# Patient Record
Sex: Male | Born: 1945
Health system: Southern US, Community
[De-identification: ages and names within clinical notes are randomized; demographics above are authoritative.]

## PROBLEM LIST (undated history)

## (undated) ENCOUNTER — Ambulatory Visit: Admission: EM | Payer: 59 | Source: Home / Self Care

## (undated) DIAGNOSIS — C61 Malignant neoplasm of prostate: Secondary | ICD-10-CM

## (undated) DIAGNOSIS — Z87442 Personal history of urinary calculi: Secondary | ICD-10-CM

## (undated) DIAGNOSIS — K219 Gastro-esophageal reflux disease without esophagitis: Secondary | ICD-10-CM

## (undated) DIAGNOSIS — G629 Polyneuropathy, unspecified: Secondary | ICD-10-CM

## (undated) DIAGNOSIS — C44300 Unspecified malignant neoplasm of skin of unspecified part of face: Secondary | ICD-10-CM

## (undated) DIAGNOSIS — M1991 Primary osteoarthritis, unspecified site: Secondary | ICD-10-CM

## (undated) DIAGNOSIS — N200 Calculus of kidney: Secondary | ICD-10-CM

## (undated) DIAGNOSIS — C189 Malignant neoplasm of colon, unspecified: Secondary | ICD-10-CM

## (undated) DIAGNOSIS — I639 Cerebral infarction, unspecified: Secondary | ICD-10-CM

## (undated) DIAGNOSIS — G43909 Migraine, unspecified, not intractable, without status migrainosus: Secondary | ICD-10-CM

## (undated) DIAGNOSIS — Z973 Presence of spectacles and contact lenses: Secondary | ICD-10-CM

## (undated) DIAGNOSIS — Z889 Allergy status to unspecified drugs, medicaments and biological substances status: Secondary | ICD-10-CM

## (undated) DIAGNOSIS — M5030 Other cervical disc degeneration, unspecified cervical region: Secondary | ICD-10-CM

## (undated) DIAGNOSIS — G894 Chronic pain syndrome: Secondary | ICD-10-CM

## (undated) HISTORY — PX: INGUINAL HERNIA REPAIR: SHX194

## (undated) HISTORY — PX: UPPER GI ENDOSCOPY: SHX6162

## (undated) HISTORY — PX: TONSILLECTOMY: SUR1361

## (undated) HISTORY — DX: Cerebral infarction, unspecified: I63.9

## (undated) HISTORY — PX: EXCISIONAL HEMORRHOIDECTOMY: SHX1541

## (undated) HISTORY — DX: Polyneuropathy, unspecified: G62.9

## (undated) HISTORY — PX: NASAL SEPTUM SURGERY: SHX37

## (undated) HISTORY — DX: Chronic pain syndrome: G89.4

## (undated) HISTORY — DX: Primary osteoarthritis, unspecified site: M19.91

## (undated) HISTORY — PX: COLONOSCOPY: SHX174

## (undated) HISTORY — PX: INGUINAL HERNIA REPAIR: SUR1180

---

## 1998-10-21 HISTORY — PX: INGUINAL HERNIA REPAIR: SUR1180

## 1999-10-22 HISTORY — PX: KNEE ARTHROSCOPY: SUR90

## 2001-08-14 ENCOUNTER — Encounter: Payer: Self-pay | Admitting: Internal Medicine

## 2001-08-14 ENCOUNTER — Ambulatory Visit (HOSPITAL_COMMUNITY): Admission: RE | Admit: 2001-08-14 | Discharge: 2001-08-14 | Payer: Self-pay | Admitting: Internal Medicine

## 2005-11-01 ENCOUNTER — Ambulatory Visit (HOSPITAL_COMMUNITY): Admission: RE | Admit: 2005-11-01 | Discharge: 2005-11-01 | Payer: Self-pay | Admitting: General Surgery

## 2006-12-24 ENCOUNTER — Ambulatory Visit (HOSPITAL_COMMUNITY): Admission: RE | Admit: 2006-12-24 | Discharge: 2006-12-24 | Payer: Self-pay | Admitting: *Deleted

## 2008-10-21 DIAGNOSIS — G629 Polyneuropathy, unspecified: Secondary | ICD-10-CM

## 2008-10-21 HISTORY — DX: Polyneuropathy, unspecified: G62.9

## 2009-10-10 ENCOUNTER — Ambulatory Visit (HOSPITAL_COMMUNITY): Admission: RE | Admit: 2009-10-10 | Discharge: 2009-10-10 | Payer: Self-pay | Admitting: Internal Medicine

## 2009-10-21 HISTORY — PX: CARPAL TUNNEL RELEASE: SHX101

## 2011-03-08 NOTE — H&P (Signed)
[name], Eduardo [name]                 ACCOUNT NO.:  [id number]   MEDICAL RECORD NO.:  [id number]           PATIENT TYPE:  AMB   LOCATION:                                FACILITY:  [facility]   PHYSICIAN:  Dalia Heading, M.D.  DATE OF BIRTH:  11-Aug-1946   DATE OF ADMISSION:  DATE OF DISCHARGE:  LH                                HISTORY & PHYSICAL   CHIEF COMPLAINT:  Hematochezia.   HISTORY OF PRESENT ILLNESS:  The patient is a 65 year old white male who was  referred for endoscopic evaluation. He needs colonoscopy for hematochezia.  No abdominal pain, weight loss, nausea, vomiting, diarrhea, constipation,  melena have been noted.  Some pruritis ani is noted.  He has never had a  colonoscopy. There is no family history of colon carcinoma.   PAST MEDICAL HISTORY:  Migraine headaches.   PAST SURGICAL HISTORY:  1.  Hemorrhoidectomy.  2.  Herniorrhaphy.  3.  Knee surgeries.   CURRENT MEDICATIONS:  Darvocet, Imitrex.   ALLERGIES:  No known drug allergies.   REVIEW OF SYSTEMS:  Noncontributory.   PHYSICAL EXAMINATION:  GENERAL:  The patient is a well-developed, well-  nourished white male in no acute distress.  LUNGS:  Clear to auscultation with equal breath sounds bilaterally.  HEART:  Regular rate and rhythm without S3, S4, or murmurs.  ABDOMEN:  Soft, nontender, nondistended.  No hepatosplenomegaly or masses  are noted.  RECTAL:  Deferred to the procedure.   IMPRESSION:  Hematochezia.  The patient is scheduled for colonoscopy on  November 01, 2005.  The risks and benefits of the procedure including  bleeding and perforation were fully explained to the patient who gave  informed consent.      Dalia Heading, M.D.  Electronically Signed     MAJ/MEDQ  D:  10/29/2005  T:  10/29/2005  Job:  [id number]   cc:   [facility]  Fax: [phone]   Madelin Rear. Sherwood Gambler, MD  Fax: [phone]

## 2011-07-02 ENCOUNTER — Other Ambulatory Visit (HOSPITAL_COMMUNITY): Payer: Self-pay | Admitting: Internal Medicine

## 2011-07-02 DIAGNOSIS — M1991 Primary osteoarthritis, unspecified site: Secondary | ICD-10-CM

## 2011-07-08 ENCOUNTER — Ambulatory Visit (HOSPITAL_COMMUNITY)
Admission: RE | Admit: 2011-07-08 | Discharge: 2011-07-08 | Disposition: A | Discharge status: Home / Self Care | Payer: Managed Care, Other (non HMO) | Source: Ambulatory Visit | Attending: Internal Medicine | Admitting: Internal Medicine

## 2011-07-08 DIAGNOSIS — M1991 Primary osteoarthritis, unspecified site: Secondary | ICD-10-CM

## 2011-07-08 DIAGNOSIS — M25559 Pain in unspecified hip: Secondary | ICD-10-CM | POA: Insufficient documentation

## 2011-07-08 DIAGNOSIS — M5137 Other intervertebral disc degeneration, lumbosacral region: Secondary | ICD-10-CM | POA: Insufficient documentation

## 2011-07-08 DIAGNOSIS — M51379 Other intervertebral disc degeneration, lumbosacral region without mention of lumbar back pain or lower extremity pain: Secondary | ICD-10-CM | POA: Insufficient documentation

## 2012-10-21 HISTORY — PX: COLON SURGERY: SHX602

## 2013-01-04 ENCOUNTER — Encounter (HOSPITAL_BASED_OUTPATIENT_CLINIC_OR_DEPARTMENT_OTHER): Payer: Self-pay | Admitting: *Deleted

## 2013-01-05 ENCOUNTER — Other Ambulatory Visit: Payer: Self-pay | Admitting: Orthopedic Surgery

## 2013-01-07 ENCOUNTER — Ambulatory Visit (HOSPITAL_BASED_OUTPATIENT_CLINIC_OR_DEPARTMENT_OTHER): Payer: Managed Care, Other (non HMO) | Admitting: Anesthesiology

## 2013-01-07 ENCOUNTER — Encounter (HOSPITAL_BASED_OUTPATIENT_CLINIC_OR_DEPARTMENT_OTHER): Payer: Self-pay | Admitting: *Deleted

## 2013-01-07 ENCOUNTER — Ambulatory Visit (HOSPITAL_BASED_OUTPATIENT_CLINIC_OR_DEPARTMENT_OTHER)
Admission: RE | Admit: 2013-01-07 | Discharge: 2013-01-07 | Disposition: A | Discharge status: Home / Self Care | Payer: Managed Care, Other (non HMO) | Source: Ambulatory Visit | Attending: Orthopedic Surgery | Admitting: Orthopedic Surgery

## 2013-01-07 ENCOUNTER — Encounter (HOSPITAL_BASED_OUTPATIENT_CLINIC_OR_DEPARTMENT_OTHER): Payer: Self-pay | Admitting: Anesthesiology

## 2013-01-07 ENCOUNTER — Encounter (HOSPITAL_BASED_OUTPATIENT_CLINIC_OR_DEPARTMENT_OTHER)
Admission: RE | Disposition: A | Discharge status: Home / Self Care | Payer: Self-pay | Source: Ambulatory Visit | Attending: Orthopedic Surgery

## 2013-01-07 DIAGNOSIS — R51 Headache: Secondary | ICD-10-CM | POA: Insufficient documentation

## 2013-01-07 DIAGNOSIS — K219 Gastro-esophageal reflux disease without esophagitis: Secondary | ICD-10-CM | POA: Insufficient documentation

## 2013-01-07 DIAGNOSIS — Z79899 Other long term (current) drug therapy: Secondary | ICD-10-CM | POA: Insufficient documentation

## 2013-01-07 DIAGNOSIS — M129 Arthropathy, unspecified: Secondary | ICD-10-CM | POA: Insufficient documentation

## 2013-01-07 DIAGNOSIS — M5030 Other cervical disc degeneration, unspecified cervical region: Secondary | ICD-10-CM | POA: Insufficient documentation

## 2013-01-07 DIAGNOSIS — G5600 Carpal tunnel syndrome, unspecified upper limb: Secondary | ICD-10-CM | POA: Insufficient documentation

## 2013-01-07 HISTORY — DX: Presence of spectacles and contact lenses: Z97.3

## 2013-01-07 HISTORY — DX: Gastro-esophageal reflux disease without esophagitis: K21.9

## 2013-01-07 HISTORY — PX: CARPAL TUNNEL RELEASE: SHX101

## 2013-01-07 HISTORY — DX: Other cervical disc degeneration, unspecified cervical region: M50.30

## 2013-01-07 SURGERY — CARPAL TUNNEL RELEASE
Anesthesia: General | Site: Wrist | Laterality: Right | Wound class: Clean

## 2013-01-07 MED ORDER — FENTANYL CITRATE 0.05 MG/ML IJ SOLN
INTRAMUSCULAR | Status: DC | PRN
  Administered 2013-01-07: 50 ug via INTRAVENOUS
  Administered 2013-01-07: 100 ug via INTRAVENOUS

## 2013-01-07 MED ORDER — HYDROCODONE-ACETAMINOPHEN 5-325 MG PO TABS: ORAL_TABLET | ORAL | Status: DC

## 2013-01-07 MED ORDER — FENTANYL CITRATE 0.05 MG/ML IJ SOLN: 50.0000 ug | INTRAMUSCULAR | Status: DC | PRN

## 2013-01-07 MED ORDER — CEFAZOLIN SODIUM-DEXTROSE 2-3 GM-% IV SOLR
2.0000 g | INTRAVENOUS | Status: AC
  Administered 2013-01-07: 2 g via INTRAVENOUS

## 2013-01-07 MED ORDER — ONDANSETRON HCL 4 MG/2ML IJ SOLN: 4.0000 mg | Freq: Four times a day (QID) | INTRAMUSCULAR | Status: DC | PRN

## 2013-01-07 MED ORDER — MIDAZOLAM HCL 2 MG/2ML IJ SOLN: 1.0000 mg | INTRAMUSCULAR | Status: DC | PRN

## 2013-01-07 MED ORDER — CHLORHEXIDINE GLUCONATE 4 % EX LIQD: 60.0000 mL | Freq: Once | CUTANEOUS | Status: DC

## 2013-01-07 MED ORDER — ONDANSETRON HCL 4 MG/2ML IJ SOLN
INTRAMUSCULAR | Status: DC | PRN
  Administered 2013-01-07: 4 mg via INTRAVENOUS

## 2013-01-07 MED ORDER — BUPIVACAINE HCL (PF) 0.25 % IJ SOLN
INTRAMUSCULAR | Status: DC | PRN
  Administered 2013-01-07: 10 mL

## 2013-01-07 MED ORDER — PROPOFOL 10 MG/ML IV BOLUS
INTRAVENOUS | Status: DC | PRN
  Administered 2013-01-07: 200 mg via INTRAVENOUS

## 2013-01-07 MED ORDER — OXYCODONE HCL 5 MG PO TABS
5.0000 mg | ORAL_TABLET | Freq: Once | ORAL | Status: AC | PRN
  Administered 2013-01-07: 5 mg via ORAL

## 2013-01-07 MED ORDER — LACTATED RINGERS IV SOLN
INTRAVENOUS | Status: DC
  Administered 2013-01-07: 08:00:00 via INTRAVENOUS
  Administered 2013-01-07: 20 mL/h via INTRAVENOUS

## 2013-01-07 MED ORDER — LIDOCAINE HCL (CARDIAC) 20 MG/ML IV SOLN
INTRAVENOUS | Status: DC | PRN
  Administered 2013-01-07: 70 mg via INTRAVENOUS

## 2013-01-07 MED ORDER — OXYCODONE HCL 5 MG/5ML PO SOLN: 5.0000 mg | Freq: Once | ORAL | Status: AC | PRN

## 2013-01-07 MED ORDER — MIDAZOLAM HCL 5 MG/5ML IJ SOLN
INTRAMUSCULAR | Status: DC | PRN
  Administered 2013-01-07: 2 mg via INTRAVENOUS

## 2013-01-07 MED ORDER — DEXAMETHASONE SODIUM PHOSPHATE 10 MG/ML IJ SOLN
INTRAMUSCULAR | Status: DC | PRN
  Administered 2013-01-07: 10 mg via INTRAVENOUS

## 2013-01-07 MED ORDER — FENTANYL CITRATE 0.05 MG/ML IJ SOLN
25.0000 ug | INTRAMUSCULAR | Status: DC | PRN
  Administered 2013-01-07: 50 ug via INTRAVENOUS
  Administered 2013-01-07 (×2): 25 ug via INTRAVENOUS

## 2013-01-07 SURGICAL SUPPLY — 36 items
BANDAGE ELASTIC 3 VELCRO ST LF (GAUZE/BANDAGES/DRESSINGS) ×2 IMPLANT
BANDAGE GAUZE ELAST BULKY 4 IN (GAUZE/BANDAGES/DRESSINGS) ×2 IMPLANT
BLADE MINI RND TIP GREEN BEAV (BLADE) IMPLANT
BLADE SURG 15 STRL LF DISP TIS (BLADE) ×2 IMPLANT
BLADE SURG 15 STRL SS (BLADE) ×4
BNDG CMPR 9X4 STRL LF SNTH (GAUZE/BANDAGES/DRESSINGS) ×1
BNDG ESMARK 4X9 LF (GAUZE/BANDAGES/DRESSINGS) ×1 IMPLANT
CHLORAPREP W/TINT 26ML (MISCELLANEOUS) ×2 IMPLANT
CLOTH BEACON ORANGE TIMEOUT ST (SAFETY) ×2 IMPLANT
CORDS BIPOLAR (ELECTRODE) ×2 IMPLANT
COVER MAYO STAND STRL (DRAPES) ×2 IMPLANT
COVER TABLE BACK 60X90 (DRAPES) ×2 IMPLANT
CUFF TOURNIQUET SINGLE 18IN (TOURNIQUET CUFF) ×2 IMPLANT
DRAPE EXTREMITY T 121X128X90 (DRAPE) ×2 IMPLANT
DRAPE SURG 17X23 STRL (DRAPES) ×2 IMPLANT
DRSG PAD ABDOMINAL 8X10 ST (GAUZE/BANDAGES/DRESSINGS) ×2 IMPLANT
GAUZE XEROFORM 1X8 LF (GAUZE/BANDAGES/DRESSINGS) ×2 IMPLANT
GLOVE BIO SURGEON STRL SZ7.5 (GLOVE) ×2 IMPLANT
GLOVE BIOGEL M STRL SZ7.5 (GLOVE) ×1 IMPLANT
GLOVE BIOGEL PI IND STRL 8 (GLOVE) IMPLANT
GLOVE BIOGEL PI INDICATOR 8 (GLOVE) ×1
GOWN BRE IMP PREV XXLGXLNG (GOWN DISPOSABLE) ×2 IMPLANT
GOWN PREVENTION PLUS XLARGE (GOWN DISPOSABLE) ×2 IMPLANT
NDL HYPO 25X1 1.5 SAFETY (NEEDLE) IMPLANT
NEEDLE HYPO 25X1 1.5 SAFETY (NEEDLE) ×2 IMPLANT
NS IRRIG 1000ML POUR BTL (IV SOLUTION) ×2 IMPLANT
PACK BASIN DAY SURGERY FS (CUSTOM PROCEDURE TRAY) ×2 IMPLANT
PADDING CAST ABS 4INX4YD NS (CAST SUPPLIES) ×1
PADDING CAST ABS COTTON 4X4 ST (CAST SUPPLIES) ×1 IMPLANT
SPONGE GAUZE 4X4 12PLY (GAUZE/BANDAGES/DRESSINGS) ×2 IMPLANT
STOCKINETTE 4X48 STRL (DRAPES) ×2 IMPLANT
SUT ETHILON 4 0 PS 2 18 (SUTURE) ×2 IMPLANT
SYR BULB 3OZ (MISCELLANEOUS) ×2 IMPLANT
SYR CONTROL 10ML LL (SYRINGE) ×1 IMPLANT
TOWEL OR 17X24 6PK STRL BLUE (TOWEL DISPOSABLE) ×4 IMPLANT
UNDERPAD 30X30 INCONTINENT (UNDERPADS AND DIAPERS) ×2 IMPLANT

## 2013-01-07 NOTE — Anesthesia Postprocedure Evaluation (Signed)
Anesthesia Post Note  Patient: Eduardo [name]  Procedure(s) Performed: Procedure(s) (LRB): CARPAL TUNNEL RELEASE (Right)  Anesthesia type: General  Patient location: PACU  Post pain: Pain level controlled and Adequate analgesia  Post assessment: Post-op Vital signs reviewed, Patient's Cardiovascular Status Stable, Respiratory Function Stable, Patent Airway and Pain level controlled  Last Vitals:  Filed Vitals:   01/07/13 1100  BP: 137/84  Pulse: 73  Temp:   Resp: 10    Post vital signs: Reviewed and stable  Level of consciousness: awake, alert  and oriented  Complications: No apparent anesthesia complications

## 2013-01-07 NOTE — H&P (Signed)
  Eduardo [name] is an 67 y.o. male.   Chief Complaint: right carpal tunnel syndrome HPI: 67 yo rhd male with three years carpal tunnel syndrome bilaterally.  Had left carpal tunnel release 2 years ago.  Numbness in thumb, index, long, ring fingers.  Nocturnal symptoms.  Positive nerve conduction studies.  He would like a carpal tunnel release.  Past Medical History  Diagnosis Date  . Arthritis   . DDD (degenerative disc disease), cervical   . Wears glasses   . GERD (gastroesophageal reflux disease)   . Headache     Past Surgical History  Procedure Laterality Date  . Hemorrhoid surgery    . Carpal tunnel release  2011    left  . Knee arthroscopy  2001    rt and lt  . Upper gi endoscopy    . Colonoscopy    . Inguinal hernia repair      left x2  . Inguinal hernia repair      right  . Tonsillectomy      History reviewed. No pertinent family history. Social History:  reports that he has never smoked. He does not have any smokeless tobacco history on file. He reports that he does not drink alcohol or use illicit drugs.  Allergies: No Known Allergies  Medications Prior to Admission  Medication Sig Dispense Refill  . HYDROcodone-acetaminophen (NORCO/VICODIN) 5-325 MG per tablet Take 1 tablet by mouth every 6 (six) hours as needed for pain.      . pregabalin (LYRICA) 75 MG capsule Take 75 mg by mouth 3 (three) times daily.      . SUMAtriptan (IMITREX) 100 MG tablet Take 100 mg by mouth every 2 (two) hours as needed for migraine.        Results for orders placed during the hospital encounter of 01/07/13 (from the past 48 hour(s))  POCT HEMOGLOBIN-HEMACUE     Status: None   Collection Time    01/07/13  8:07 AM      Result Value Range   Hemoglobin 15.0  13.0 - 17.0 g/dL    No results found.   A comprehensive review of systems was negative except for: Eyes: positive for contacts/glasses Neurological: positive for headaches  Blood pressure 124/80, pulse 71, temperature 98.2  F (36.8 C), temperature source Oral, height 6\' 2"  (1.88 m), weight 107.502 kg (237 lb).  General appearance: alert, cooperative and appears stated age Head: Normocephalic, without obvious abnormality, atraumatic Neck: supple, symmetrical, trachea midline Resp: clear to auscultation bilaterally Cardio: regular rate and rhythm GI: non tender Extremities: intact sensation and capillary refill all digits though fingers fell a little different.  +epl/fpl/io.  +tinels, phalens, durkins. Pulses: 2+ and symmetric Skin: Skin color, texture, turgor normal. No rashes or lesions Neurologic: Grossly normal Incision/Wound: na  Assessment/Plan Right carpal tunnel syndrome.  Non operative and operative treatment options were discussed with the patient and patient wishes to proceed with operative treatment. Risks, benefits, and alternatives of surgery were discussed and the patient agrees with the plan of care.   Casidy Alberta R 01/07/2013, 9:21 AM

## 2013-01-07 NOTE — Transfer of Care (Signed)
Immediate Anesthesia Transfer of Care Note  Patient: Eduardo [name]  Procedure(s) Performed: Procedure(s): CARPAL TUNNEL RELEASE (Right)  Patient Location: PACU  Anesthesia Type:General  Level of Consciousness: sedated  Airway & Oxygen Therapy: Patient Spontanous Breathing and Patient connected to face mask oxygen  Post-op Assessment: Report given to PACU RN and Post -op Vital signs reviewed and stable  Post vital signs: Reviewed and stable  Complications: No apparent anesthesia complications

## 2013-01-07 NOTE — Anesthesia Preprocedure Evaluation (Signed)
Anesthesia Evaluation  Patient identified by MRN, date of birth, ID band Patient awake    Reviewed: Allergy & Precautions, H&P , NPO status , Patient's Chart, lab work & pertinent test results  Airway Mallampati: II  Neck ROM: full    Dental   Pulmonary          Cardiovascular     Neuro/Psych  Headaches,    GI/Hepatic GERD-  ,  Endo/Other    Renal/GU      Musculoskeletal  (+) Arthritis -,   Abdominal   Peds  Hematology   Anesthesia Other Findings   Reproductive/Obstetrics                           Anesthesia Physical Anesthesia Plan  ASA: II  Anesthesia Plan: General   Post-op Pain Management:    Induction: Intravenous  Airway Management Planned: LMA  Additional Equipment:   Intra-op Plan:   Post-operative Plan:   Informed Consent: I have reviewed the patients History and Physical, chart, labs and discussed the procedure including the risks, benefits and alternatives for the proposed anesthesia with the patient or authorized representative who has indicated his/her understanding and acceptance.     Plan Discussed with: CRNA and Surgeon  Anesthesia Plan Comments:         Anesthesia Quick Evaluation

## 2013-01-07 NOTE — Anesthesia Procedure Notes (Signed)
Procedure Name: LMA Insertion Date/Time: 01/07/2013 9:34 AM Performed by: Burna Cash Pre-anesthesia Checklist: Patient identified, Emergency Drugs available, Suction available and Patient being monitored Patient Re-evaluated:Patient Re-evaluated prior to inductionOxygen Delivery Method: Circle System Utilized Preoxygenation: Pre-oxygenation with 100% oxygen Intubation Type: IV induction Ventilation: Mask ventilation without difficulty LMA: LMA inserted LMA Size: 5.0 Number of attempts: 1 Airway Equipment and Method: bite block Placement Confirmation: positive ETCO2 Tube secured with: Tape Dental Injury: Teeth and Oropharynx as per pre-operative assessment

## 2013-01-07 NOTE — Op Note (Signed)
[id number] 

## 2013-01-07 NOTE — Brief Op Note (Signed)
01/07/2013  10:13 AM  PATIENT:  Eduardo [name]  67 y.o. male  PRE-OPERATIVE DIAGNOSIS:  RIGHT CARPAL TUNNEL SYNDROME  POST-OPERATIVE DIAGNOSIS:  RIGHT CARPAL TUNNEL SYNDROM  PROCEDURE:  Procedure(s): CARPAL TUNNEL RELEASE (Right)  SURGEON:  Surgeon(s) and Role:    * Tami Ribas, MD - Primary  PHYSICIAN ASSISTANT:   ASSISTANTS: none   ANESTHESIA:   general  EBL:  Total I/O In: 800 [I.V.:800] Out: -   BLOOD ADMINISTERED:none  DRAINS: none   LOCAL MEDICATIONS USED:  MARCAINE     SPECIMEN:  No Specimen  DISPOSITION OF SPECIMEN:  N/A  COUNTS:  YES  TOURNIQUET:   Total Tourniquet Time Documented: Forearm (Right) - 28 minutes Total: Forearm (Right) - 28 minutes   DICTATION: .Other Dictation: Dictation Number [id number]  PLAN OF CARE: Discharge to home after PACU  PATIENT DISPOSITION:  PACU - hemodynamically stable.

## 2013-01-07 NOTE — Discharge Instructions (Addendum)

## 2013-01-08 ENCOUNTER — Encounter (HOSPITAL_BASED_OUTPATIENT_CLINIC_OR_DEPARTMENT_OTHER): Payer: Self-pay | Admitting: Orthopedic Surgery

## 2013-01-08 NOTE — Op Note (Signed)
Eduardo, [name] NO.:  [id number]  MEDICAL RECORD NO.:  [id number]  LOCATION:                                 FACILITY:  PHYSICIAN:  Betha Loa, MD             DATE OF BIRTH:  DATE OF PROCEDURE:  01/07/2013 DATE OF DISCHARGE:                              OPERATIVE REPORT   PREOPERATIVE DIAGNOSIS:  Right carpal tunnel syndrome.  POSTOPERATIVE DIAGNOSIS:  Right carpal tunnel syndrome.  PROCEDURE:  Right carpal tunnel release.  SURGEON:  Betha Loa, MD  ASSISTANT:  None.  ANESTHESIA:  General.  IV FLUIDS:  Per anesthesia flow sheet.  ESTIMATED BLOOD LOSS:  Minimal.  COMPLICATIONS:  None.  SPECIMENS:  None.  TOURNIQUET TIME:  27 minutes.  DISPOSITION:  Stable to PACU.  INDICATIONS:  Eduardo [name] is a 67 year old male with 2 years of carpal tunnel syndrome on the right hand.  He has had a left carpal tunnel release and wishes to have the same for the right.  He has positive nerve conduction studies.  He has positive nocturnal symptoms.  The risks, benefits and alternatives of surgery were discussed including the risk of blood loss; infection; damage to nerves, vessels, tendons, ligaments, bone; failure of surgery; need for additional surgery; complications with wound healing and continued carpal tunnel syndrome. He voiced understanding of these risks and elected to proceed.  OPERATIVE COURSE:  After being identified preoperatively by myself, the patient and I agreed upon the procedure and site of procedure.  The surgical site was marked.  The risks, benefits, and alternatives of surgery were reviewed and wished to proceed.  Surgical consent had been signed.  He was given 2 g of IV Ancef as preoperative antibiotic prophylaxis.  He was transferred to the operating room and placed on the operating room table in supine position with the right upper extremity on an armboard.  General anesthesia was induced by the anesthesiologist. The right upper  extremity was prepped and draped in normal sterile orthopedic fashion.  A surgical pause was performed between the surgeons, anesthesia, and operating room staff, and all were in agreement as to the patient, procedure, and site of procedure. Tourniquet at the proximal aspect of the forearm was inflated to 250 mmHg after exsanguination of the limb with an Esmarch bandage.  An incision was made over the transverse carpal ligament.  This was carried into subcutaneous tissues by spreading technique.  Bipolar electrocautery was used to obtain hemostasis.  The palmar fascia was incised.  The transverse carpal ligament was identified and incised in its distal direction.  Care was taken to ensure complete decompression distally.  It was incised proximally.  The scissors were used to split the distal aspect of the volar antebrachial fascia.  Finger was placed into the wound to ensure complete decompression, which was the case. The motor branch was identified and was intact.  There were some remaining strands of palmar fascia that appeared tight distally.  These were released as well.  The wound was copiously irrigated with sterile saline.  It was then closed with 4-0 nylon in a horizontal mattress fashion.  It  was injected with 10 mL of 0.25% plain Marcaine to aid in postoperative analgesia.  It was then dressed with sterile Xeroform, 4x4s, an ABD and wrapped with Kerlix and Ace bandage.  Tourniquet was deflated at 27 minutes.  Fingertips were pink with brisk capillary refill after deflation of tourniquet.  Operative drapes were broken down, and the patient was awoken from anesthesia safely.  He was transferred back to stretcher and taken to PACU in stable condition.  I will see him back in the office 1 week for postoperative followup.  I will give him Norco 5/325 1-2 p.o. q.6 hours p.r.n. pain, dispensed #30.     Betha Loa, MD     KK/MEDQ  D:  01/07/2013  T:  01/08/2013  Job:  [id number]

## 2013-03-23 ENCOUNTER — Ambulatory Visit (HOSPITAL_COMMUNITY)
Admission: RE | Admit: 2013-03-23 | Discharge: 2013-03-23 | Disposition: A | Discharge status: Home / Self Care | Payer: Managed Care, Other (non HMO) | Source: Ambulatory Visit | Attending: Family Medicine | Admitting: Family Medicine

## 2013-03-23 ENCOUNTER — Other Ambulatory Visit (HOSPITAL_COMMUNITY): Payer: Self-pay | Admitting: Family Medicine

## 2013-03-23 DIAGNOSIS — R05 Cough: Secondary | ICD-10-CM

## 2013-03-23 DIAGNOSIS — J209 Acute bronchitis, unspecified: Secondary | ICD-10-CM

## 2013-03-23 DIAGNOSIS — R059 Cough, unspecified: Secondary | ICD-10-CM | POA: Insufficient documentation

## 2013-03-23 DIAGNOSIS — R0602 Shortness of breath: Secondary | ICD-10-CM | POA: Insufficient documentation

## 2013-04-15 ENCOUNTER — Ambulatory Visit (INDEPENDENT_AMBULATORY_CARE_PROVIDER_SITE_OTHER): Payer: Managed Care, Other (non HMO) | Admitting: Otolaryngology

## 2013-04-15 DIAGNOSIS — R070 Pain in throat: Secondary | ICD-10-CM

## 2013-04-15 DIAGNOSIS — J310 Chronic rhinitis: Secondary | ICD-10-CM

## 2013-10-04 ENCOUNTER — Other Ambulatory Visit (HOSPITAL_COMMUNITY): Payer: Self-pay | Admitting: Internal Medicine

## 2013-10-04 DIAGNOSIS — R109 Unspecified abdominal pain: Secondary | ICD-10-CM

## 2013-10-06 ENCOUNTER — Ambulatory Visit (HOSPITAL_COMMUNITY): Payer: Managed Care, Other (non HMO)

## 2013-10-07 ENCOUNTER — Other Ambulatory Visit: Payer: Self-pay | Admitting: Gastroenterology

## 2013-10-07 ENCOUNTER — Ambulatory Visit (INDEPENDENT_AMBULATORY_CARE_PROVIDER_SITE_OTHER): Payer: Managed Care, Other (non HMO) | Admitting: Gastroenterology

## 2013-10-07 ENCOUNTER — Encounter: Payer: Self-pay | Admitting: Gastroenterology

## 2013-10-07 ENCOUNTER — Ambulatory Visit: Payer: Managed Care, Other (non HMO) | Admitting: Gastroenterology

## 2013-10-07 VITALS — BP 128/78 | HR 73 | Temp 97.6°F | Wt 231.4 lb

## 2013-10-07 DIAGNOSIS — Z1211 Encounter for screening for malignant neoplasm of colon: Secondary | ICD-10-CM

## 2013-10-07 DIAGNOSIS — K5909 Other constipation: Secondary | ICD-10-CM

## 2013-10-07 DIAGNOSIS — K219 Gastro-esophageal reflux disease without esophagitis: Secondary | ICD-10-CM

## 2013-10-07 DIAGNOSIS — K5904 Chronic idiopathic constipation: Secondary | ICD-10-CM

## 2013-10-07 MED ORDER — OMEPRAZOLE 20 MG PO CPDR: DELAYED_RELEASE_CAPSULE | ORAL | Status: DC

## 2013-10-07 MED ORDER — PEG-KCL-NACL-NASULF-NA ASC-C 100 G PO SOLR: 1.0000 | ORAL | Status: DC

## 2013-10-07 NOTE — Progress Notes (Signed)
cc'd to pcp 

## 2013-10-07 NOTE — Assessment & Plan Note (Signed)
SX CONTROLLED WITH AMITIZA.  ADD FIBER POWDER OR 1 PACKET TO PROPEL OR MEALS ONCE DAILY FOR 3 DAYS THEN TWICE DAILY FOR 3 DAYS THEN THREE TIMES A DAY. AVOID HIGHER DOSES IF IT CAUSES BLOATING & GAS. DRINK WATER TO KEEP YOUR URINE LIGHT YELLOW. FOLLOW A HIGH FIBER/LOW FAT DIET.  HO GIVEN. FOLLOW UP AS NEEDED.

## 2013-10-07 NOTE — Addendum Note (Signed)
Addended by: [place] on: 10/07/2013 10:43 AM   Modules accepted: Orders

## 2013-10-07 NOTE — Patient Instructions (Addendum)
COLONOSCOPY ON DEC 29.  ADD FIBER POWDER OR 1 PACKET TO PROPEL OR MEALS ONCE DAILY FOR 3 DAYS THEN TWICE DAILY FOR 3 DAYS THEN THREE TIMES A DAY. AVOID HIGHER DOSES IF IT CAUSES BLOATING & GAS.  BE CAREFUK DRINKING PROPEL BECAUSE IT ADDS EXTRA CALORIES TO YOUR DAILY DIET AND MAY CAUSE YOU TO GAIN WEIGHT.  TRY TO DRINK WATER TO KEEP YOUR URINE LIGHT YELLOW.  TAKE OMEPRAZOLE.  TAKE 30 MINUTES PRIOR TO YOUR FIRST MEAL.  ZANTAC HELPS MOST WHEN USED AS NEEDED.  FOLLOW A HIGH FIBER/LOW FAT DIET.  HO GIVEN.  FOLLOW UP AS NEEDED.  High-Fiber Diet A high-fiber diet changes your normal diet to include more whole grains, legumes, fruits, and vegetables. Changes in the diet involve replacing refined carbohydrates with unrefined foods. The calorie level of the diet is essentially unchanged. The Dietary Reference Intake (recommended amount) for adult males is 38 grams per day. For adult females, it is 25 grams per day. Pregnant and lactating women should consume 28 grams of fiber per day. Fiber is the intact part of a plant that is not broken down during digestion. Functional fiber is fiber that has been isolated from the plant to provide a beneficial effect in the body. PURPOSE  Increase stool bulk.   Ease and regulate bowel movements.   Lower cholesterol.  INDICATIONS THAT YOU NEED MORE FIBER  Constipation and hemorrhoids.   Uncomplicated diverticulosis (intestine condition) and irritable bowel syndrome.   Weight management.   As a protective measure against hardening of the arteries (atherosclerosis), diabetes, and cancer.   GUIDELINES FOR INCREASING FIBER IN THE DIET  Start adding fiber to the diet slowly. A gradual increase of about 5 more grams (2 slices of whole-wheat bread, 2 servings of most fruits or vegetables, or 1 bowl of high-fiber cereal) per day is best. Too rapid an increase in fiber may result in constipation, flatulence, and bloating.   Drink enough water and fluids to  keep your urine clear or pale yellow. Water, juice, or caffeine-free drinks are recommended. Not drinking enough fluid may cause constipation.   Eat a variety of high-fiber foods rather than one type of fiber.   Try to increase your intake of fiber through using high-fiber foods rather than fiber pills or supplements that contain small amounts of fiber.   The goal is to change the types of food eaten. Do not supplement your present diet with high-fiber foods, but replace foods in your present diet.  INCLUDE A VARIETY OF FIBER SOURCES  Replace refined and processed grains with whole grains, canned fruits with fresh fruits, and incorporate other fiber sources. White rice, white breads, and most bakery goods contain little or no fiber.   Brown whole-grain rice, buckwheat oats, and many fruits and vegetables are all good sources of fiber. These include: broccoli, Brussels sprouts, cabbage, cauliflower, beets, sweet potatoes, white potatoes (skin on), carrots, tomatoes, eggplant, squash, berries, fresh fruits, and dried fruits.   Cereals appear to be the richest source of fiber. Cereal fiber is found in whole grains and bran. Bran is the fiber-rich outer coat of cereal grain, which is largely removed in refining. In whole-grain cereals, the bran remains. In breakfast cereals, the largest amount of fiber is found in those with "bran" in their names. The fiber content is sometimes indicated on the label.   You may need to include additional fruits and vegetables each day.   In baking, for 1 cup white flour, you may  use the following substitutions:   1 cup whole-wheat flour minus 2 tablespoons.   1/2 cup white flour plus 1/2 cup whole-wheat flour.   Low-Fat Diet BREADS, CEREALS, PASTA, RICE, DRIED PEAS, AND BEANS These products are high in carbohydrates and most are low in fat. Therefore, they can be increased in the diet as substitutes for fatty foods. They too, however, contain calories and should  not be eaten in excess. Cereals can be eaten for snacks as well as for breakfast.   FRUITS AND VEGETABLES It is good to eat fruits and vegetables. Besides being sources of fiber, both are rich in vitamins and some minerals. They help you get the daily allowances of these nutrients. Fruits and vegetables can be used for snacks and desserts.  MEATS Limit lean meat, chicken, [place], and fish to no more than 6 ounces per day. Beef, Pork, and Lamb Use lean cuts of beef, pork, and lamb. Lean cuts include:  Extra-lean ground beef.  Arm roast.  Sirloin tip.  Center-cut ham.  Round steak.  Loin chops.  Rump roast.  Tenderloin.  Trim all fat off the outside of meats before cooking. It is not necessary to severely decrease the intake of red meat, but lean choices should be made. Lean meat is rich in protein and contains a highly absorbable form of iron. Premenopausal women, in particular, should avoid reducing lean red meat because this could increase the risk for low red blood cells (iron-deficiency anemia).  Chicken and [place] These are good sources of protein. The fat of poultry can be reduced by removing the skin and underlying fat layers before cooking. Chicken and [place] can be substituted for lean red meat in the diet. Poultry should not be fried or covered with high-fat sauces. Fish and Shellfish Fish is a good source of protein. Shellfish contain cholesterol, but they usually are low in saturated fatty acids. The preparation of fish is important. Like chicken and [place], they should not be fried or covered with high-fat sauces. EGGS Egg whites contain no fat or cholesterol. They can be eaten often. Try 1 to 2 egg whites instead of whole eggs in recipes or use egg substitutes that do not contain yolk. MILK AND DAIRY PRODUCTS Use skim or 1% milk instead of 2% or whole milk. Decrease whole milk, natural, and processed cheeses. Use nonfat or low-fat (2%) cottage cheese or low-fat cheeses made  from vegetable oils. Choose nonfat or low-fat (1 to 2%) yogurt. Experiment with evaporated skim milk in recipes that call for heavy cream. Substitute low-fat yogurt or low-fat cottage cheese for sour cream in dips and salad dressings. Have at least 2 servings of low-fat dairy products, such as 2 glasses of skim (or 1%) milk each day to help get your daily calcium intake. FATS AND OILS Reduce the total intake of fats, especially saturated fat. Butterfat, lard, and beef fats are high in saturated fat and cholesterol. These should be avoided as much as possible. Vegetable fats do not contain cholesterol, but certain vegetable fats, such as coconut oil, palm oil, and palm kernel oil are very high in saturated fats. These should be limited. These fats are often used in bakery goods, processed foods, popcorn, oils, and nondairy creamers. Vegetable shortenings and some peanut butters contain hydrogenated oils, which are also saturated fats. Read the labels on these foods and check for saturated vegetable oils. Unsaturated vegetable oils and fats do not raise blood cholesterol. However, they should be limited because they are fats  and are high in calories. Total fat should still be limited to 30% of your daily caloric intake. Desirable liquid vegetable oils are corn oil, cottonseed oil, olive oil, canola oil, safflower oil, soybean oil, and sunflower oil. Peanut oil is not as good, but small amounts are acceptable. Buy a heart-healthy tub margarine that has no partially hydrogenated oils in the ingredients. Mayonnaise and salad dressings often are made from unsaturated fats, but they should also be limited because of their high calorie and fat content. Seeds, nuts, peanut butter, olives, and avocados are high in fat, but the fat is mainly the unsaturated type. These foods should be limited mainly to avoid excess calories and fat. OTHER EATING TIPS Snacks  Most sweets should be limited as snacks. They tend to be rich  in calories and fats, and their caloric content outweighs their nutritional value. Some good choices in snacks are graham crackers, melba toast, soda crackers, bagels (no egg), English muffins, fruits, and vegetables. These snacks are preferable to snack crackers, [place] fries, TORTILLA CHIPS, and POTATO chips. Popcorn should be air-popped or cooked in small amounts of liquid vegetable oil. Desserts Eat fruit, low-fat yogurt, and fruit ices instead of pastries, cake, and cookies. Sherbet, angel food cake, gelatin dessert, frozen low-fat yogurt, or other frozen products that do not contain saturated fat (pure fruit juice bars, frozen ice pops) are also acceptable.  COOKING METHODS Choose those methods that use little or no fat. They include: Poaching.  Braising.  Steaming.  Grilling.  Baking.  Stir-frying.  Broiling.  Microwaving.  Foods can be cooked in a nonstick pan without added fat, or use a nonfat cooking spray in regular cookware. Limit fried foods and avoid frying in saturated fat. Add moisture to lean meats by using water, broth, cooking wines, and other nonfat or low-fat sauces along with the cooking methods mentioned above. Soups and stews should be chilled after cooking. The fat that forms on top after a few hours in the refrigerator should be skimmed off. When preparing meals, avoid using excess salt. Salt can contribute to raising blood pressure in some people.  EATING AWAY FROM HOME Order entres, potatoes, and vegetables without sauces or butter. When meat exceeds the size of a deck of cards (3 to 4 ounces), the rest can be taken home for another meal. Choose vegetable or fruit salads and ask for low-calorie salad dressings to be served on the side. Use dressings sparingly. Limit high-fat toppings, such as bacon, crumbled eggs, cheese, sunflower seeds, and olives. Ask for heart-healthy tub margarine instead of butter.

## 2013-10-07 NOTE — Assessment & Plan Note (Signed)
BREAKTHROUGH SX ON ZANTAC.  TAKE OMEPRAZOLE.  TAKE 30 MINUTES PRIOR TO YOUR FIRST MEAL.  ZANTAC HELPS MOST WHEN USED AS NEEDED.  FOLLOW UP IN 6 MOS. MERRY CHRISTMAS!

## 2013-10-07 NOTE — Progress Notes (Signed)
Subjective:    Patient ID: Eduardo [name], male    DOB: 1946-03-26, 67 y.o.   MRN: [id number]  Cassell Smiles., MD  HPI With Amitiza bowels move once a week. Lifetime problem with constipation. IF EATS NORMALLY AND IT BACKS UP. IT HURTS. A LOT OF GROWLING IN THE LSST MONTH. NO WEIGHT LOSS. APPETITE: FINE. PT DENIES FEVER, CHILLS, BRBPR, nausea, vomiting, melena, diarrhea,  problems swallowing, OR problems with sedation.  LOTS OF heartburn or indigestion: AT LEAST 2X/WEEK. USES RANITIDINE. NEVER TRIED OTHER PPIs. DRINKS A LOT OF PROPEL. CAN'T STAND WATER.  NOT A LOT OF FIBER. WORKS 12 HRS 3-4 DAYS A WEEK. UP TO 5-6. ONLY EATS ONE GOOD MEAL A DAY. TREATED EMPIRICALLY(CIP/FLAGYL) FOR DIVERTICULITIS DEC 12.  Past Medical History  Diagnosis Date  . Arthritis   . DDD (degenerative disc disease), cervical   . Wears glasses   . GERD (gastroesophageal reflux disease)   . [id number](478.2)    Past Surgical History  Procedure Laterality Date  . Hemorrhoid surgery    . Carpal tunnel release  2011    left  . Knee arthroscopy  2001    rt and lt  . Upper gi endoscopy    . Colonoscopy  > 10 YEARS  . Inguinal hernia repair      left x2  . Inguinal hernia repair      right  . Tonsillectomy    . Carpal tunnel release Right 01/07/2013    Procedure: CARPAL TUNNEL RELEASE;  Surgeon: Tami Ribas, MD;  Location: [facility];  Service: Orthopedics;  Laterality: Right;   Allergies  Allergen Reactions  . Niaspan [Niacin Er] Rash    Current Outpatient Prescriptions  Medication Sig Dispense Refill  . AMITIZA 24 MCG capsule Take 24 mcg by mouth 2 (two) times daily with a meal.       . HYDROcodone-acetaminophen (NORCO/VICODIN) 5-325 MG per tablet Take 1 tablet by mouth every 6 (six) hours as needed for pain.      . pregabalin (LYRICA) 75 MG capsule Take 75 mg by mouth 3 (three) times daily.      . ranitidine (ZANTAC) 300 MG tablet Take 300 mg by mouth at bedtime.       . SUMAtriptan  (IMITREX) 100 MG tablet Take 100 mg by mouth every 2 (two) hours as needed for migraine.  1X/WEEK     Family History  Problem Relation Age of Onset  . Colon polyps Father   . Colon polyps Paternal Uncle   . Colon polyps Maternal Grandfather     History  Substance Use Topics  . Smoking status: Never Smoker   . Smokeless tobacco: Not on file  . Alcohol Use: No       Review of Systems PER HPI OTHERWISE ALL SYSTEMS ARE NEGATIVE.    Objective:   Physical Exam  Vitals reviewed. Constitutional: He is oriented to person, place, and time. He appears well-nourished. No distress.  HENT:  Head: Normocephalic and atraumatic.  Mouth/Throat: Oropharynx is clear and moist. No oropharyngeal exudate.  Eyes: Pupils are equal, round, and reactive to light. No scleral icterus.  Neck: Normal range of motion. Neck supple.  Cardiovascular: Normal rate, regular rhythm and normal heart sounds.   Pulmonary/Chest: Effort normal and breath sounds normal. No respiratory distress.  Abdominal: Soft. Bowel sounds are normal. He exhibits no distension. There is no tenderness.  Musculoskeletal: He exhibits no edema.  Lymphadenopathy:    He has no cervical adenopathy.  Neurological: He is alert and oriented to person, place, and time.  NO FOCAL DEFICITS   Psychiatric:  FLAT AFFECT, NL MOOD           Assessment & Plan:

## 2013-10-08 NOTE — Progress Notes (Signed)
Reminder in epic °

## 2013-10-11 ENCOUNTER — Ambulatory Visit (HOSPITAL_COMMUNITY)
Admission: RE | Admit: 2013-10-11 | Discharge: 2013-10-11 | Disposition: A | Discharge status: Home / Self Care | Payer: Managed Care, Other (non HMO) | Source: Ambulatory Visit | Attending: Internal Medicine | Admitting: Internal Medicine

## 2013-10-11 DIAGNOSIS — R109 Unspecified abdominal pain: Secondary | ICD-10-CM | POA: Insufficient documentation

## 2013-10-11 DIAGNOSIS — M5137 Other intervertebral disc degeneration, lumbosacral region: Secondary | ICD-10-CM | POA: Insufficient documentation

## 2013-10-11 DIAGNOSIS — K219 Gastro-esophageal reflux disease without esophagitis: Secondary | ICD-10-CM | POA: Insufficient documentation

## 2013-10-11 DIAGNOSIS — M51379 Other intervertebral disc degeneration, lumbosacral region without mention of lumbar back pain or lower extremity pain: Secondary | ICD-10-CM | POA: Insufficient documentation

## 2013-10-11 DIAGNOSIS — Z9889 Other specified postprocedural states: Secondary | ICD-10-CM | POA: Insufficient documentation

## 2013-10-11 DIAGNOSIS — K5900 Constipation, unspecified: Secondary | ICD-10-CM | POA: Insufficient documentation

## 2013-10-11 LAB — POCT I-STAT CREATININE: Creatinine, Ser: 1 mg/dL (ref 0.50–1.35)

## 2013-10-11 MED ORDER — IOHEXOL 300 MG/ML  SOLN
100.0000 mL | Freq: Once | INTRAMUSCULAR | Status: AC | PRN
  Administered 2013-10-11: 100 mL via INTRAVENOUS

## 2013-10-18 ENCOUNTER — Encounter (HOSPITAL_COMMUNITY)
Admission: RE | Disposition: A | Discharge status: Home / Self Care | Payer: Self-pay | Source: Ambulatory Visit | Attending: Gastroenterology

## 2013-10-18 ENCOUNTER — Encounter (HOSPITAL_COMMUNITY): Payer: Self-pay | Admitting: *Deleted

## 2013-10-18 ENCOUNTER — Ambulatory Visit (HOSPITAL_COMMUNITY)
Admission: RE | Admit: 2013-10-18 | Discharge: 2013-10-18 | Disposition: A | Discharge status: Home / Self Care | Payer: Managed Care, Other (non HMO) | Source: Ambulatory Visit | Attending: Gastroenterology | Admitting: Gastroenterology

## 2013-10-18 DIAGNOSIS — Z1211 Encounter for screening for malignant neoplasm of colon: Secondary | ICD-10-CM

## 2013-10-18 DIAGNOSIS — D126 Benign neoplasm of colon, unspecified: Secondary | ICD-10-CM | POA: Insufficient documentation

## 2013-10-18 DIAGNOSIS — K648 Other hemorrhoids: Secondary | ICD-10-CM | POA: Insufficient documentation

## 2013-10-18 DIAGNOSIS — K5730 Diverticulosis of large intestine without perforation or abscess without bleeding: Secondary | ICD-10-CM

## 2013-10-18 DIAGNOSIS — C183 Malignant neoplasm of hepatic flexure: Secondary | ICD-10-CM | POA: Insufficient documentation

## 2013-10-18 DIAGNOSIS — K639 Disease of intestine, unspecified: Secondary | ICD-10-CM

## 2013-10-18 HISTORY — PX: COLONOSCOPY: SHX5424

## 2013-10-18 LAB — COMPREHENSIVE METABOLIC PANEL
ALT: 18 U/L (ref 0–53)
AST: 17 U/L (ref 0–37)
Albumin: 3.7 g/dL (ref 3.5–5.2)
Alkaline Phosphatase: 71 U/L (ref 39–117)
CO2: 24 mEq/L (ref 19–32)
Calcium: 8.8 mg/dL (ref 8.4–10.5)
Chloride: 108 mEq/L (ref 96–112)
Creatinine, Ser: 0.92 mg/dL (ref 0.50–1.35)
GFR calc Af Amer: >90 mL/min (ref >90)
GFR calc non Af Amer: 87 mL/min — ABNORMAL LOW (ref >90)
Glucose, Bld: 107 mg/dL — ABNORMAL HIGH (ref 70–99)
Potassium: 3.7 mEq/L (ref 3.5–5.1)
Sodium: 142 mEq/L (ref 135–145)
Total Bilirubin: 0.6 mg/dL (ref 0.3–1.2)
Total Protein: 7 g/dL (ref 6.0–8.3)

## 2013-10-18 LAB — CBC
HCT: 40.5 % (ref 39.0–52.0)
MCHC: 34.3 g/dL (ref 30.0–36.0)
Platelets: 215 10*3/uL (ref 150–400)
RDW: 13.3 % (ref 11.5–15.5)
WBC: 5.1 10*3/uL (ref 4.0–10.5)

## 2013-10-18 LAB — PROTIME-INR: INR: 1.07 (ref 0.00–1.49)

## 2013-10-18 SURGERY — COLONOSCOPY
Anesthesia: Moderate Sedation

## 2013-10-18 MED ORDER — MEPERIDINE HCL 100 MG/ML IJ SOLN
INTRAMUSCULAR | Status: DC | PRN
  Administered 2013-10-18 (×2): 25 mg via INTRAVENOUS
  Administered 2013-10-18: 50 mg via INTRAVENOUS
  Administered 2013-10-18: 25 mg via INTRAVENOUS

## 2013-10-18 MED ORDER — SODIUM CHLORIDE 0.9 % IV SOLN
INTRAVENOUS | Status: DC
  Administered 2013-10-18: 10:00:00 via INTRAVENOUS

## 2013-10-18 MED ORDER — SPOT INK MARKER SYRINGE KIT
PACK | SUBMUCOSAL | Status: DC | PRN
  Administered 2013-10-18: 4 mL via SUBMUCOSAL

## 2013-10-18 MED ORDER — MEPERIDINE HCL 100 MG/ML IJ SOLN
INTRAMUSCULAR | Status: AC
  Filled 2013-10-18: qty 2

## 2013-10-18 MED ORDER — MIDAZOLAM HCL 5 MG/5ML IJ SOLN
INTRAMUSCULAR | Status: DC | PRN
  Administered 2013-10-18: 2 mg via INTRAVENOUS
  Administered 2013-10-18 (×2): 1 mg via INTRAVENOUS
  Administered 2013-10-18 (×2): 2 mg via INTRAVENOUS

## 2013-10-18 MED ORDER — MIDAZOLAM HCL 5 MG/5ML IJ SOLN
INTRAMUSCULAR | Status: AC
  Filled 2013-10-18: qty 10

## 2013-10-18 NOTE — Op Note (Signed)
[facility] [address] [place], [address]   COLONOSCOPY PROCEDURE REPORT  PATIENT: Eduardo, [name]  MR#: [id number] BIRTHDATE: 09-Aug-1946 , 67  yrs. old GENDER: Male ENDOSCOPIST: Jonette Eva, MD REFERRED LK:GMWNU Sherwood Gambler, M.D. PROCEDURE DATE:  10/18/2013 PROCEDURE:   Colonoscopy with biopsy and Submucosal injection: SPOT  INDICATIONS:Average risk patient for colon cancer.  LAST TCS > 10 YEARS. MEDICATIONS: Demerol 125 mg IV and Versed 8 mg IV  DESCRIPTION OF PROCEDURE:    Physical exam was performed.  Informed consent was obtained from the patient after explaining the benefits, risks, and alternatives to procedure.  The patient was connected to monitor and placed in left lateral position. Continuous oxygen was provided by nasal cannula and IV medicine administered through an indwelling cannula.  After administration of sedation and rectal exam, the patients rectum was intubated and the EC-[id number] ([id number])  colonoscope was advanced under direct visualization to the cecum.  The scope was removed slowly by carefully examining the color, texture, anatomy, and integrity mucosa on the way out.  The patient was recovered in endoscopy and discharged home in satisfactory condition.    COLON FINDINGS: A one-quarter circumferential DPRESSED ulcerated mass was found at the hepatic flexure.  4 cc SPOT TATTOO INJECTED AT SITE.  Multiple biopsies were performed using cold forceps.  , There was mild diverticulosis noted in the sigmoid colon with associated muscular hypertrophy.  , and Moderate sized internal hemorrhoids were found.  PREP QUALITY: good.  CECAL W/D TIME: 21 minutes     COMPLICATIONS: None  ENDOSCOPIC IMPRESSION: 1.   One-quarter circumferential mass at the hepatic flexure due to Advanced ADENOMA V. COLON CA. 2.   Mild diverticulosis in the sigmoid colon 3.   Moderate sized internal hemorrhoids  RECOMMENDATIONS: BIOPSY WILL BE BACK IN 3 DAYS. LABS  TODAY: CBC, PT/INR, CMP, CEA. FULL COLONOSCOPY IN 1 YEAR. ALL FIRST DEGREE RELATIVES NEED A COLONOSCOPY AT AGE 50.       _______________________________ eSignedJonette Eva, MD 10/18/2013 11:40 AM

## 2013-10-18 NOTE — H&P (View-Only) (Signed)
 Subjective:    Patient ID: Eduardo [name], male    DOB: 06/01/1946, 67 y.o.   MRN: [id number]  FUSCO,LAWRENCE J., MD  HPI With Amitiza bowels move once a week. Lifetime problem with constipation. IF EATS NORMALLY AND IT BACKS UP. IT HURTS. A LOT OF GROWLING IN THE LSST MONTH. NO WEIGHT LOSS. APPETITE: FINE. PT DENIES FEVER, CHILLS, BRBPR, nausea, vomiting, melena, diarrhea,  problems swallowing, OR problems with sedation.  LOTS OF heartburn or indigestion: AT LEAST 2X/WEEK. USES RANITIDINE. NEVER TRIED OTHER PPIs. DRINKS A LOT OF PROPEL. CAN'T STAND WATER.  NOT A LOT OF FIBER. WORKS 12 HRS 3-4 DAYS A WEEK. UP TO 5-6. ONLY EATS ONE GOOD MEAL A DAY. TREATED EMPIRICALLY(CIP/FLAGYL) FOR DIVERTICULITIS DEC 12.  Past Medical History  Diagnosis Date  . Arthritis   . DDD (degenerative disc disease), cervical   . Wears glasses   . GERD (gastroesophageal reflux disease)   . Headache(784.0)    Past Surgical History  Procedure Laterality Date  . Hemorrhoid surgery    . Carpal tunnel release  2011    left  . Knee arthroscopy  2001    rt and lt  . Upper gi endoscopy    . Colonoscopy  > 10 YEARS  . Inguinal hernia repair      left x2  . Inguinal hernia repair      right  . Tonsillectomy    . Carpal tunnel release Right 01/07/2013    Procedure: CARPAL TUNNEL RELEASE;  Surgeon: Kevin R Kuzma, MD;  Location: [facility];  Service: Orthopedics;  Laterality: Right;   Allergies  Allergen Reactions  . Niaspan [Niacin Er] Rash    Current Outpatient Prescriptions  Medication Sig Dispense Refill  . AMITIZA 24 MCG capsule Take 24 mcg by mouth 2 (two) times daily with a meal.       . HYDROcodone-acetaminophen (NORCO/VICODIN) 5-325 MG per tablet Take 1 tablet by mouth every 6 (six) hours as needed for pain.      . pregabalin (LYRICA) 75 MG capsule Take 75 mg by mouth 3 (three) times daily.      . ranitidine (ZANTAC) 300 MG tablet Take 300 mg by mouth at bedtime.       . SUMAtriptan  (IMITREX) 100 MG tablet Take 100 mg by mouth every 2 (two) hours as needed for migraine.  1X/WEEK     Family History  Problem Relation Age of Onset  . Colon polyps Father   . Colon polyps Paternal Uncle   . Colon polyps Maternal Grandfather     History  Substance Use Topics  . Smoking status: Never Smoker   . Smokeless tobacco: Not on file  . Alcohol Use: No       Review of Systems PER HPI OTHERWISE ALL SYSTEMS ARE NEGATIVE.    Objective:   Physical Exam  Vitals reviewed. Constitutional: He is oriented to person, place, and time. He appears well-nourished. No distress.  HENT:  Head: Normocephalic and atraumatic.  Mouth/Throat: Oropharynx is clear and moist. No oropharyngeal exudate.  Eyes: Pupils are equal, round, and reactive to light. No scleral icterus.  Neck: Normal range of motion. Neck supple.  Cardiovascular: Normal rate, regular rhythm and normal heart sounds.   Pulmonary/Chest: Effort normal and breath sounds normal. No respiratory distress.  Abdominal: Soft. Bowel sounds are normal. He exhibits no distension. There is no tenderness.  Musculoskeletal: He exhibits no edema.  Lymphadenopathy:    He has no cervical adenopathy.    Neurological: He is alert and oriented to person, place, and time.  NO FOCAL DEFICITS   Psychiatric:  FLAT AFFECT, NL MOOD           Assessment & Plan:   

## 2013-10-18 NOTE — Interval H&P Note (Signed)
History and Physical Interval Note:  10/18/2013 10:17 AM  Eduardo [name]  has presented today for surgery, with the diagnosis of SCREENING COLONOSCOPY  The various methods of treatment have been discussed with the patient and family. After consideration of risks, benefits and other options for treatment, the patient has consented to  Procedure(s) with comments: COLONOSCOPY (N/A) - 2:00-moved to 1030 Pam to notify pt as a surgical intervention .  The patient's history has been reviewed, patient examined, no change in status, stable for surgery.  I have reviewed the patient's chart and labs.  Questions were answered to the patient's satisfaction.     [employer]

## 2013-10-19 ENCOUNTER — Ambulatory Visit (HOSPITAL_COMMUNITY)
Admission: RE | Admit: 2013-10-19 | Discharge: 2013-10-19 | Disposition: A | Discharge status: Home / Self Care | Payer: Managed Care, Other (non HMO) | Source: Ambulatory Visit | Attending: General Surgery | Admitting: General Surgery

## 2013-10-19 ENCOUNTER — Encounter: Payer: Self-pay | Admitting: Gastroenterology

## 2013-10-19 ENCOUNTER — Other Ambulatory Visit: Payer: Self-pay | Admitting: Gastroenterology

## 2013-10-19 ENCOUNTER — Telehealth: Payer: Self-pay | Admitting: Gastroenterology

## 2013-10-19 ENCOUNTER — Encounter (HOSPITAL_COMMUNITY)
Admission: RE | Admit: 2013-10-19 | Discharge: 2013-10-19 | Disposition: A | Discharge status: Home / Self Care | Payer: Managed Care, Other (non HMO) | Source: Ambulatory Visit | Attending: General Surgery | Admitting: General Surgery

## 2013-10-19 ENCOUNTER — Encounter (HOSPITAL_COMMUNITY): Payer: Self-pay

## 2013-10-19 ENCOUNTER — Other Ambulatory Visit: Payer: Self-pay

## 2013-10-19 ENCOUNTER — Inpatient Hospital Stay (HOSPITAL_COMMUNITY): Admission: RE | Admit: 2013-10-19 | Payer: Managed Care, Other (non HMO) | Source: Ambulatory Visit

## 2013-10-19 DIAGNOSIS — Z09 Encounter for follow-up examination after completed treatment for conditions other than malignant neoplasm: Secondary | ICD-10-CM

## 2013-10-19 NOTE — Telephone Encounter (Signed)
ERROR

## 2013-10-19 NOTE — Telephone Encounter (Signed)
Called patient TO DISCUSS RESULTS. EXPLAINED HE HAS AN ADVANCED POLYP THAT NEEDS TO BE SURGICALLY REMOVED. HE WILL NEED A CXR PRIOR TO SURGERY. PT WILL CALL ME WHEN HE WANTS TO ARRANGE SURGERY. REPEAT TCS IN DEC 2015.

## 2013-10-19 NOTE — Telephone Encounter (Signed)
Records have been faxed to Dr. Lovell Sheehan and the order for the chest x-ray has been placed

## 2013-10-19 NOTE — Telephone Encounter (Addendum)
FAX ALL RECORDS NOW. SURGERY PLANNED FOR TOMORROW. PT ALSO NEEDS A CXR PRIOR TO SURGERY. SEND ORDER TO [facility].

## 2013-10-19 NOTE — Patient Instructions (Signed)
RUCKER PRIDGEON  10/19/2013   Your procedure is scheduled on:  10/20/2013  Report to [facility] at  745  AM.  Call this number if you have problems the morning of surgery: [phone]   Remember:   Do not eat food or drink liquids after midnight.   Take these medicines the morning of surgery with A SIP OF WATER: norco, prilosec, lyrica, zantac   Do not wear jewelry, make-up or nail polish.  Do not wear lotions, powders, or perfumes.  Do not shave 48 hours prior to surgery. Men may shave face and neck.  Do not bring valuables to the hospital.  [facility] is not responsible for any belongings or valuables.               Contacts, dentures or bridgework may not be worn into surgery.  Leave suitcase in the car. After surgery it may be brought to your room.  For patients admitted to the hospital, discharge time is determined by your treatment team.               Patients discharged the day of surgery will not be allowed to drive home.  Name and phone number of your driver: family  Special Instructions: Shower using CHG 2 nights before surgery and the night before surgery.  If you shower the day of surgery use CHG.  Use special wash - you have one bottle of CHG for all showers.  You should use approximately 1/3 of the bottle for each shower.   Please read over the following fact sheets that you were given: Pain Booklet, Coughing and Deep Breathing, Surgical Site Infection Prevention, Anesthesia Post-op Instructions and Care and Recovery After Surgery Laparoscopic Colon Resection Laparoscopic colon resection is a relatively new procedure and is not performed in all centers. It may be done to remove a piece of the colon (large intestine) that may be sore and reddened (inflamed). It may be done to remove a portion of bowel that is blocked. The intestine may be blocked because of colon cancer. It is sometimes used to treat diseases of the bowel in which there are multiple small outgrowths  from the bowel wall (polyps), which may predispose a person to cancer. LET YOUR CAREGIVER KNOW ABOUT:  Allergies.  Medications taken including herbs, eye drops, over the counter medications, and creams.  Use of steroids (by mouth or creams).  Previous problems with anesthetics or novocaine.  Possibility of pregnancy, if this applies.  History of blood clots (thrombophlebitis).  History of bleeding or blood problems.  Previous surgery.  Other health problems. RISKS AND COMPLICATIONS Some problems, which occur following this procedure, include:  Infection: A germ starts growing in the wound. This can usually be treated with medicine that kills germs (antibiotics).  Bleeding following surgery may be a complication of almost all surgeries. Your surgeon takes every precaution to keep this from happening.  Damage to other organs may occur. If damage to other organs or excessive bleeding should occur it may be necessary to convert the laparoscopic procedure into an open abdominal (belly) procedure. This means the surgery is performed by opening the abdomen and performing the surgery under direct vision. Scarring from previous surgeries or disease may also be a cause to change this procedure to an open abdominal operation.  Sometimes a leak can occur in the line where the bowel was sewn together after the portion of bowel was removed.  It is possible for the  bowel to become obstructed in the area where it was sewn together. When this happens, it is sometimes necessary to operate again to repair this. This may be accomplished using the laparoscope or opening the abdomen and operating in the usual manner without the laparoscope. BEFORE THE PROCEDURE You should be present 2 hours prior to your procedure or as instructed.  PROCEDURE  Laparoscopic means a laparoscope (a small pencil sized telescope) is used. You are made to sleep with medicine (anesthetized). Your surgeon inflates your belly  (abdomen) with a needle like device (trocar and cannula). The inflation is done with a harmless gas (carbon dioxide). This makes your organs easier to see. The laparoscope is inserted into your abdomen through a small slit (incision) that allows your surgeon to see into the abdomen. Other small instruments, such as probes and operating instruments, are inserted into the abdomen through other small openings (ports). These ports allow the surgeon to perform the operation. Often surgeons attach a video camera to the laparoscope to enlarge the view. During the procedure the portion of bowel to be removed is taken out through one of the ports. A port may have to be enlarged if the bowel is too large to be removed. In this case a small incision will be made and some times the bowel is reconnected (anastamosis) outside the abdomen. After the procedure, the gas is released, and your incisions are closed with stitches (sutures). Because these incisions are small (usually less than one-half inch), there is usually minimal discomfort following the procedure. AFTER THE PROCEDURE The recovery time, if there are no problems, is shortened compared to regular surgery. You will rest in a recovery room until you are stable and doing well. Following this, barring other problems you will be allowed to return to your room. Recovery times vary depending on what is found at surgery, the age of the patient, general health, etc. SEEK IMMEDIATE MEDICAL CARE IF:   There is redness, swelling, or increasing pain in the wound area.  Pus is coming from the wound.  An unexplained oral temperature above 102 F (38.9 C) develops or as directed.  You notice a foul smell coming from the wound or dressing.  There is a breaking open of a wound (edges not staying together) after sutures have been removed.  You develop increasing abdominal pain. Document Released: 12/28/2002 Document Revised: 12/30/2011 Document Reviewed:  11/06/2007 [facility] Patient Information 2014 [place], [place]. PATIENT INSTRUCTIONS POST-ANESTHESIA  IMMEDIATELY FOLLOWING SURGERY:  Do not drive or operate machinery for the first twenty four hours after surgery.  Do not make any important decisions for twenty four hours after surgery or while taking narcotic pain medications or sedatives.  If you develop intractable nausea and vomiting or a severe headache please notify your doctor immediately.  FOLLOW-UP:  Please make an appointment with your surgeon as instructed. You do not need to follow up with anesthesia unless specifically instructed to do so.  WOUND CARE INSTRUCTIONS (if applicable):  Keep a dry clean dressing on the anesthesia/puncture wound site if there is drainage.  Once the wound has quit draining you may leave it open to air.  Generally you should leave the bandage intact for twenty four hours unless there is drainage.  If the epidural site drains for more than 36-48 hours please call the anesthesia department.  QUESTIONS?:  Please feel free to call your physician or the hospital operator if you have any questions, and they will be happy to assist you.

## 2013-10-20 ENCOUNTER — Inpatient Hospital Stay (HOSPITAL_COMMUNITY)
Admission: RE | Admit: 2013-10-20 | Discharge: 2013-10-24 | DRG: 331 | Disposition: A | Discharge status: Home / Self Care | Payer: Managed Care, Other (non HMO) | Source: Ambulatory Visit | Attending: General Surgery | Admitting: General Surgery

## 2013-10-20 ENCOUNTER — Encounter (HOSPITAL_COMMUNITY): Payer: Managed Care, Other (non HMO) | Admitting: Anesthesiology

## 2013-10-20 ENCOUNTER — Encounter (HOSPITAL_COMMUNITY)
Admission: RE | Disposition: A | Discharge status: Home / Self Care | Payer: Self-pay | Source: Ambulatory Visit | Attending: General Surgery

## 2013-10-20 ENCOUNTER — Encounter (HOSPITAL_COMMUNITY): Payer: Self-pay | Admitting: *Deleted

## 2013-10-20 ENCOUNTER — Inpatient Hospital Stay (HOSPITAL_COMMUNITY): Payer: Managed Care, Other (non HMO) | Admitting: Anesthesiology

## 2013-10-20 DIAGNOSIS — M129 Arthropathy, unspecified: Secondary | ICD-10-CM | POA: Diagnosis present

## 2013-10-20 DIAGNOSIS — Z800 Family history of malignant neoplasm of digestive organs: Secondary | ICD-10-CM

## 2013-10-20 DIAGNOSIS — C189 Malignant neoplasm of colon, unspecified: Secondary | ICD-10-CM

## 2013-10-20 DIAGNOSIS — M5030 Other cervical disc degeneration, unspecified cervical region: Secondary | ICD-10-CM | POA: Diagnosis present

## 2013-10-20 DIAGNOSIS — K219 Gastro-esophageal reflux disease without esophagitis: Secondary | ICD-10-CM | POA: Diagnosis present

## 2013-10-20 DIAGNOSIS — C183 Malignant neoplasm of hepatic flexure: Principal | ICD-10-CM | POA: Diagnosis present

## 2013-10-20 HISTORY — PX: COLON RESECTION: SHX5231

## 2013-10-20 HISTORY — PX: APPENDECTOMY: SHX54

## 2013-10-20 HISTORY — DX: Malignant neoplasm of colon, unspecified: C18.9

## 2013-10-20 SURGERY — COLECTOMY, HAND-ASSISTED, LAPAROSCOPIC
Anesthesia: General | Site: Abdomen

## 2013-10-20 MED ORDER — BUPIVACAINE HCL (PF) 0.5 % IJ SOLN
INTRAMUSCULAR | Status: AC
  Filled 2013-10-20: qty 30

## 2013-10-20 MED ORDER — ALVIMOPAN 12 MG PO CAPS
12.0000 mg | ORAL_CAPSULE | Freq: Two times a day (BID) | ORAL | Status: DC
  Administered 2013-10-21 – 2013-10-23 (×5): 12 mg via ORAL
  Filled 2013-10-20 (×6): qty 1

## 2013-10-20 MED ORDER — KETOROLAC TROMETHAMINE 30 MG/ML IJ SOLN
30.0000 mg | Freq: Once | INTRAMUSCULAR | Status: AC
  Administered 2013-10-20: 30 mg via INTRAVENOUS

## 2013-10-20 MED ORDER — LIDOCAINE HCL (PF) 1 % IJ SOLN
INTRAMUSCULAR | Status: AC
  Filled 2013-10-20: qty 5

## 2013-10-20 MED ORDER — LIDOCAINE HCL 1 % IJ SOLN
INTRAMUSCULAR | Status: DC | PRN
  Administered 2013-10-20: 20 mg via INTRADERMAL
  Administered 2013-10-20: 40 mg via INTRADERMAL

## 2013-10-20 MED ORDER — SODIUM CHLORIDE BACTERIOSTATIC 0.9 % IJ SOLN
INTRAMUSCULAR | Status: AC
  Filled 2013-10-20: qty 10

## 2013-10-20 MED ORDER — POVIDONE-IODINE 10 % EX OINT
TOPICAL_OINTMENT | CUTANEOUS | Status: AC
  Filled 2013-10-20: qty 1

## 2013-10-20 MED ORDER — ENOXAPARIN SODIUM 40 MG/0.4ML ~~LOC~~ SOLN
SUBCUTANEOUS | Status: AC
  Filled 2013-10-20: qty 0.4

## 2013-10-20 MED ORDER — PHENYLEPHRINE HCL 10 MG/ML IJ SOLN
INTRAMUSCULAR | Status: AC
  Filled 2013-10-20: qty 1

## 2013-10-20 MED ORDER — SIMETHICONE 80 MG PO CHEW
80.0000 mg | CHEWABLE_TABLET | Freq: Four times a day (QID) | ORAL | Status: DC | PRN
  Administered 2013-10-21: 80 mg via ORAL
  Filled 2013-10-20: qty 1

## 2013-10-20 MED ORDER — DEXTROSE 5 % IV SOLN: 1.0000 g | Freq: Two times a day (BID) | INTRAVENOUS | Status: DC

## 2013-10-20 MED ORDER — SUCCINYLCHOLINE CHLORIDE 20 MG/ML IJ SOLN
INTRAMUSCULAR | Status: AC
Start: 2013-10-20 — End: 2013-10-20
  Filled 2013-10-20: qty 1

## 2013-10-20 MED ORDER — ROCURONIUM BROMIDE 50 MG/5ML IV SOLN
INTRAVENOUS | Status: AC
  Filled 2013-10-20: qty 1

## 2013-10-20 MED ORDER — SODIUM CHLORIDE 0.9 % IR SOLN
Status: DC | PRN
  Administered 2013-10-20: 1000 mL
  Administered 2013-10-20: 3000 mL
  Administered 2013-10-20 (×2): 1000 mL

## 2013-10-20 MED ORDER — MIDAZOLAM HCL 2 MG/2ML IJ SOLN
INTRAMUSCULAR | Status: AC
  Filled 2013-10-20: qty 2

## 2013-10-20 MED ORDER — NEOSTIGMINE METHYLSULFATE 1 MG/ML IJ SOLN
INTRAMUSCULAR | Status: DC | PRN
  Administered 2013-10-20: 4 mg via INTRAVENOUS

## 2013-10-20 MED ORDER — FENTANYL CITRATE 0.05 MG/ML IJ SOLN
INTRAMUSCULAR | Status: AC
  Filled 2013-10-20: qty 2

## 2013-10-20 MED ORDER — KETOROLAC TROMETHAMINE 30 MG/ML IJ SOLN
INTRAMUSCULAR | Status: AC
  Filled 2013-10-20: qty 1

## 2013-10-20 MED ORDER — HEMOSTATIC AGENTS (NO CHARGE) OPTIME
TOPICAL | Status: DC | PRN
  Administered 2013-10-20: 1

## 2013-10-20 MED ORDER — BUPIVACAINE LIPOSOME 1.3 % IJ SUSP
INTRAMUSCULAR | Status: DC | PRN
  Administered 2013-10-20: 10 mL

## 2013-10-20 MED ORDER — EPHEDRINE SULFATE 50 MG/ML IJ SOLN
INTRAMUSCULAR | Status: AC
  Filled 2013-10-20: qty 1

## 2013-10-20 MED ORDER — SODIUM CHLORIDE BACTERIOSTATIC 0.9 % IJ SOLN
INTRAMUSCULAR | Status: AC
  Filled 2013-10-20: qty 20

## 2013-10-20 MED ORDER — LACTATED RINGERS IV SOLN
INTRAVENOUS | Status: DC
  Administered 2013-10-20 – 2013-10-21 (×3): via INTRAVENOUS

## 2013-10-20 MED ORDER — ONDANSETRON HCL 4 MG/2ML IJ SOLN: 4.0000 mg | Freq: Once | INTRAMUSCULAR | Status: DC | PRN

## 2013-10-20 MED ORDER — GLYCOPYRROLATE 0.2 MG/ML IJ SOLN
INTRAMUSCULAR | Status: DC | PRN
  Administered 2013-10-20: 0.6 mg via INTRAVENOUS

## 2013-10-20 MED ORDER — BUPIVACAINE LIPOSOME 1.3 % IJ SUSP
20.0000 mL | Freq: Once | INTRAMUSCULAR | Status: DC
  Filled 2013-10-20: qty 20

## 2013-10-20 MED ORDER — PROPOFOL 10 MG/ML IV BOLUS
INTRAVENOUS | Status: DC | PRN
  Administered 2013-10-20: 150 mg via INTRAVENOUS

## 2013-10-20 MED ORDER — ACETAMINOPHEN 500 MG PO TABS
1000.0000 mg | ORAL_TABLET | Freq: Four times a day (QID) | ORAL | Status: AC
  Administered 2013-10-20 – 2013-10-21 (×3): 1000 mg via ORAL
  Filled 2013-10-20 (×4): qty 2

## 2013-10-20 MED ORDER — CEFOTETAN DISODIUM-DEXTROSE 2-2.08 GM-% IV SOLR
2.0000 g | Freq: Two times a day (BID) | INTRAVENOUS | Status: DC
  Administered 2013-10-20: 2 g via INTRAVENOUS
  Filled 2013-10-20: qty 50

## 2013-10-20 MED ORDER — SUFENTANIL CITRATE 50 MCG/ML IV SOLN
INTRAVENOUS | Status: AC
  Filled 2013-10-20: qty 1

## 2013-10-20 MED ORDER — LORAZEPAM 2 MG/ML IJ SOLN
1.0000 mg | INTRAMUSCULAR | Status: DC | PRN
  Administered 2013-10-22: 1 mg via INTRAVENOUS
  Filled 2013-10-20: qty 1

## 2013-10-20 MED ORDER — ENOXAPARIN SODIUM 40 MG/0.4ML ~~LOC~~ SOLN
40.0000 mg | SUBCUTANEOUS | Status: DC
  Administered 2013-10-21 – 2013-10-24 (×4): 40 mg via SUBCUTANEOUS
  Filled 2013-10-20 (×4): qty 0.4

## 2013-10-20 MED ORDER — ONDANSETRON HCL 4 MG/2ML IJ SOLN
4.0000 mg | Freq: Once | INTRAMUSCULAR | Status: AC
  Administered 2013-10-20: 4 mg via INTRAVENOUS

## 2013-10-20 MED ORDER — LACTATED RINGERS IV SOLN
INTRAVENOUS | Status: DC
  Administered 2013-10-20 (×2): 1000 mL via INTRAVENOUS
  Administered 2013-10-20 (×3): via INTRAVENOUS

## 2013-10-20 MED ORDER — HYDROMORPHONE HCL PF 1 MG/ML IJ SOLN
1.0000 mg | INTRAMUSCULAR | Status: DC | PRN
  Administered 2013-10-20 – 2013-10-24 (×26): 1 mg via INTRAVENOUS
  Filled 2013-10-20 (×26): qty 1

## 2013-10-20 MED ORDER — MIDAZOLAM HCL 2 MG/2ML IJ SOLN
1.0000 mg | INTRAMUSCULAR | Status: AC | PRN
  Administered 2013-10-20 (×3): 2 mg via INTRAVENOUS

## 2013-10-20 MED ORDER — ALVIMOPAN 12 MG PO CAPS
12.0000 mg | ORAL_CAPSULE | Freq: Once | ORAL | Status: AC
  Administered 2013-10-20: 12 mg via ORAL

## 2013-10-20 MED ORDER — SUFENTANIL CITRATE 50 MCG/ML IV SOLN
INTRAVENOUS | Status: DC | PRN
  Administered 2013-10-20: 20 ug via INTRAVENOUS
  Administered 2013-10-20 (×2): 5 ug via INTRAVENOUS
  Administered 2013-10-20: 10 ug via INTRAVENOUS
  Administered 2013-10-20: 20 ug via INTRAVENOUS
  Administered 2013-10-20: 10 ug via INTRAVENOUS

## 2013-10-20 MED ORDER — ONDANSETRON HCL 4 MG/2ML IJ SOLN
4.0000 mg | Freq: Four times a day (QID) | INTRAMUSCULAR | Status: DC | PRN
  Administered 2013-10-20 – 2013-10-23 (×4): 4 mg via INTRAVENOUS
  Filled 2013-10-20 (×4): qty 2

## 2013-10-20 MED ORDER — SUCCINYLCHOLINE CHLORIDE 20 MG/ML IJ SOLN
INTRAMUSCULAR | Status: DC | PRN
  Administered 2013-10-20: 120 mg via INTRAVENOUS

## 2013-10-20 MED ORDER — GLYCOPYRROLATE 0.2 MG/ML IJ SOLN
INTRAMUSCULAR | Status: AC
  Filled 2013-10-20: qty 3

## 2013-10-20 MED ORDER — CHLORHEXIDINE GLUCONATE 4 % EX LIQD: 1.0000 "application " | Freq: Once | CUTANEOUS | Status: DC

## 2013-10-20 MED ORDER — PROPOFOL 10 MG/ML IV EMUL
INTRAVENOUS | Status: AC
  Filled 2013-10-20: qty 20

## 2013-10-20 MED ORDER — PREGABALIN 75 MG PO CAPS
75.0000 mg | ORAL_CAPSULE | Freq: Three times a day (TID) | ORAL | Status: DC
  Administered 2013-10-20: 75 mg via ORAL
  Filled 2013-10-20 (×5): qty 1

## 2013-10-20 MED ORDER — DEXAMETHASONE SODIUM PHOSPHATE 4 MG/ML IJ SOLN
INTRAMUSCULAR | Status: AC
  Filled 2013-10-20: qty 1

## 2013-10-20 MED ORDER — ONDANSETRON HCL 4 MG/2ML IJ SOLN
INTRAMUSCULAR | Status: AC
  Filled 2013-10-20: qty 2

## 2013-10-20 MED ORDER — OXYCODONE-ACETAMINOPHEN 5-325 MG PO TABS
1.0000 | ORAL_TABLET | ORAL | Status: DC | PRN
  Administered 2013-10-20 – 2013-10-21 (×2): 1 via ORAL
  Administered 2013-10-21 – 2013-10-24 (×9): 2 via ORAL
  Filled 2013-10-20: qty 1
  Filled 2013-10-20 (×5): qty 2
  Filled 2013-10-20: qty 1
  Filled 2013-10-20 (×5): qty 2

## 2013-10-20 MED ORDER — ONDANSETRON HCL 4 MG PO TABS: 4.0000 mg | ORAL_TABLET | Freq: Four times a day (QID) | ORAL | Status: DC | PRN

## 2013-10-20 MED ORDER — ALVIMOPAN 12 MG PO CAPS
ORAL_CAPSULE | ORAL | Status: AC
  Filled 2013-10-20: qty 1

## 2013-10-20 MED ORDER — ENOXAPARIN SODIUM 40 MG/0.4ML ~~LOC~~ SOLN
40.0000 mg | Freq: Once | SUBCUTANEOUS | Status: AC
  Administered 2013-10-20: 40 mg via SUBCUTANEOUS

## 2013-10-20 MED ORDER — POVIDONE-IODINE 10 % OINT PACKET
TOPICAL_OINTMENT | CUTANEOUS | Status: DC | PRN
  Administered 2013-10-20: 1 via TOPICAL

## 2013-10-20 MED ORDER — PANTOPRAZOLE SODIUM 40 MG PO TBEC
40.0000 mg | DELAYED_RELEASE_TABLET | Freq: Every day | ORAL | Status: DC
  Administered 2013-10-21 – 2013-10-24 (×4): 40 mg via ORAL
  Filled 2013-10-20 (×5): qty 1

## 2013-10-20 MED ORDER — DEXAMETHASONE SODIUM PHOSPHATE 4 MG/ML IJ SOLN
4.0000 mg | Freq: Once | INTRAMUSCULAR | Status: AC
  Administered 2013-10-20: 4 mg via INTRAVENOUS

## 2013-10-20 MED ORDER — ROCURONIUM BROMIDE 100 MG/10ML IV SOLN
INTRAVENOUS | Status: DC | PRN
  Administered 2013-10-20: 5 mg via INTRAVENOUS
  Administered 2013-10-20 (×2): 10 mg via INTRAVENOUS
  Administered 2013-10-20: 35 mg via INTRAVENOUS

## 2013-10-20 MED ORDER — SURGILUBE EX GEL
CUTANEOUS | Status: DC | PRN
  Administered 2013-10-20: 1 via TOPICAL

## 2013-10-20 MED ORDER — FENTANYL CITRATE 0.05 MG/ML IJ SOLN
25.0000 ug | INTRAMUSCULAR | Status: DC | PRN
  Administered 2013-10-20: 25 ug via INTRAVENOUS
  Administered 2013-10-20 (×2): 50 ug via INTRAVENOUS
  Administered 2013-10-20: 25 ug via INTRAVENOUS

## 2013-10-20 SURGICAL SUPPLY — 70 items
BAG HAMPER (MISCELLANEOUS) ×2 IMPLANT
BLADE HEX COATED 2.75 (ELECTRODE) ×2 IMPLANT
BLADE SURG SZ10 CARB STEEL (BLADE) ×2 IMPLANT
CHLORAPREP W/TINT 26ML (MISCELLANEOUS) ×2 IMPLANT
CLOTH BEACON ORANGE TIMEOUT ST (SAFETY) ×2 IMPLANT
COVER LIGHT HANDLE STERIS (MISCELLANEOUS) ×4 IMPLANT
DRAPE INCISE IOBAN 44X35 STRL (DRAPES) ×2 IMPLANT
DRAPE PROXIMA HALF (DRAPES) ×2 IMPLANT
DRAPE WARM FLUID 44X44 (DRAPE) ×2 IMPLANT
DRSG OPSITE POSTOP 4X8 (GAUZE/BANDAGES/DRESSINGS) ×2 IMPLANT
ELECT REM PT RETURN 9FT ADLT (ELECTROSURGICAL) ×2
ELECTRODE REM PT RTRN 9FT ADLT (ELECTROSURGICAL) ×1 IMPLANT
FILTER SMOKE EVAC LAPAROSHD (FILTER) ×2 IMPLANT
GLOVE BIO SURGEON STRL SZ7.5 (GLOVE) ×4 IMPLANT
GLOVE BIOGEL PI IND STRL 7.0 (GLOVE) IMPLANT
GLOVE BIOGEL PI IND STRL 8 (GLOVE) IMPLANT
GLOVE BIOGEL PI INDICATOR 7.0 (GLOVE) ×5
GLOVE BIOGEL PI INDICATOR 8 (GLOVE) ×2
GLOVE ECLIPSE 6.5 STRL STRAW (GLOVE) ×2 IMPLANT
GLOVE EXAM NITRILE LRG STRL (GLOVE) ×1 IMPLANT
GLOVE SS BIOGEL STRL SZ 6.5 (GLOVE) IMPLANT
GLOVE SUPERSENSE BIOGEL SZ 6.5 (GLOVE) ×2
GOWN STRL REIN XL XLG (GOWN DISPOSABLE) ×12 IMPLANT
HEMOSTAT SURGICEL 4X8 (HEMOSTASIS) ×1 IMPLANT
INST SET LAPROSCOPIC AP (KITS) ×2 IMPLANT
INST SET MAJOR GENERAL (KITS) ×2 IMPLANT
IV NS IRRIG 3000ML ARTHROMATIC (IV SOLUTION) ×1 IMPLANT
KIT BLADEGUARD II DBL (SET/KITS/TRAYS/PACK) ×1 IMPLANT
KIT ROOM TURNOVER APOR (KITS) ×1 IMPLANT
LIGASURE IMPACT 36 18CM CVD LR (INSTRUMENTS) ×1 IMPLANT
LIGASURE LAP ATLAS 10MM 37CM (INSTRUMENTS) ×1 IMPLANT
LUBRICANT JELLY 4.5OZ STERILE (MISCELLANEOUS) ×1 IMPLANT
MANIFOLD NEPTUNE II (INSTRUMENTS) ×2 IMPLANT
NDL HYPO 18GX1.5 BLUNT FILL (NEEDLE) IMPLANT
NDL HYPO 25X1 1.5 SAFETY (NEEDLE) IMPLANT
NDL INSUFFLATION 14GA 120MM (NEEDLE) IMPLANT
NEEDLE HYPO 18GX1.5 BLUNT FILL (NEEDLE) ×2 IMPLANT
NEEDLE HYPO 25X1 1.5 SAFETY (NEEDLE) ×2 IMPLANT
NEEDLE INSUFFLATION 14GA 120MM (NEEDLE) ×2 IMPLANT
NS IRRIG 1000ML POUR BTL (IV SOLUTION) ×4 IMPLANT
PACK ABDOMINAL MAJOR (CUSTOM PROCEDURE TRAY) ×1 IMPLANT
PACK LAP CHOLE LZT030E (CUSTOM PROCEDURE TRAY) ×2 IMPLANT
PAD ARMBOARD 7.5X6 YLW CONV (MISCELLANEOUS) ×2 IMPLANT
PENCIL HANDSWITCHING (ELECTRODE) ×3 IMPLANT
RELOAD PROXIMATE 75MM BLUE (ENDOMECHANICALS) ×4 IMPLANT
RELOAD STAPLE 75 3.8 BLU REG (ENDOMECHANICALS) IMPLANT
SET BASIN LINEN APH (SET/KITS/TRAYS/PACK) ×2 IMPLANT
SET TUBE IRRIG SUCTION NO TIP (IRRIGATION / IRRIGATOR) ×1 IMPLANT
SPONGE GAUZE 2X2 8PLY STRL LF (GAUZE/BANDAGES/DRESSINGS) ×4 IMPLANT
SPONGE GAUZE 4X4 12PLY (GAUZE/BANDAGES/DRESSINGS) ×2 IMPLANT
SPONGE LAP 18X18 X RAY DECT (DISPOSABLE) ×6 IMPLANT
SPONGE SURGIFOAM ABS GEL 100 (HEMOSTASIS) ×1 IMPLANT
STAPLER GUN LINEAR PROX 60 (STAPLE) ×2 IMPLANT
STAPLER PROXIMATE 75MM BLUE (STAPLE) ×1 IMPLANT
STAPLER VISISTAT (STAPLE) ×2 IMPLANT
SUCTION POOLE TIP (SUCTIONS) ×2 IMPLANT
SUT CHROMIC 2 0 SH (SUTURE) ×1 IMPLANT
SUT PDS AB 0 CTX 60 (SUTURE) ×4 IMPLANT
SUT SILK 3 0 SH CR/8 (SUTURE) ×2 IMPLANT
SYR 20CC LL (SYRINGE) ×1 IMPLANT
SYS LAPSCP GELPORT 120MM (MISCELLANEOUS) ×2
SYSTEM LAPSCP GELPORT 120MM (MISCELLANEOUS) ×1 IMPLANT
TAPE CLOTH SURG 4X10 WHT LF (GAUZE/BANDAGES/DRESSINGS) ×1 IMPLANT
TRAY FOLEY CATH 16FR SILVER (SET/KITS/TRAYS/PACK) ×2 IMPLANT
TROCAR ENDO BLADELESS 11MM (ENDOMECHANICALS) ×2 IMPLANT
TROCAR XCEL NON-BLD 5MMX100MML (ENDOMECHANICALS) ×2 IMPLANT
TROCAR XCEL UNIV SLVE 11M 100M (ENDOMECHANICALS) ×2 IMPLANT
TUBING INSUF HEATED (TUBING) ×2 IMPLANT
WARMER LAPAROSCOPE (MISCELLANEOUS) ×2 IMPLANT
YANKAUER SUCT BULB TIP 10FT TU (MISCELLANEOUS) ×3 IMPLANT

## 2013-10-20 NOTE — Progress Notes (Signed)
Utilization Review Complete  

## 2013-10-20 NOTE — Op Note (Signed)
Patient:  Eduardo [name]  DOB:  01-17-1946  MRN:  [id number]   Preop Diagnosis:  Colon carcinoma, hepatic flexure  Postop Diagnosis:  Same  Procedure:  Laparoscopic hand-assisted right hemicolectomy  Surgeon:  Franky Macho, MD   Anes:  General endotracheal  Indications:  Patient is a 67 year old white male who was recently found on colonoscopy to have an adenocarcinoma at the hepatic flexure. The risks and benefits of the procedure including bleeding, infection, cardiopulmonary difficulties, and the possibility of the blood transfusion, and the possibility of an open procedure were fully explained to the patient, who gave informed consent.  Procedure note:  The patient is placed the supine position. After induction of general endotracheal anesthesia, the abdomen was prepped and draped using the usual sterile technique with DuraPrep. Surgical site confirmation was performed.  Midline incision was made at the level of the umbilicus. The peritoneal cavity was entered into without difficulty. A GelPort was then inserted. An additional 11 mm trocar was placed left upper quadrant and another one in the right lower quadrant region. The liver was inspected and noted within normal limits. The affected colon was palpated at the hepatic flexure. This was previously tattooed and was easily visible. The right colon was mobilized along the peritoneal reflection using Endo Shears and Bovie electrocautery. The hepatic flexure was taken down using the LigaSure. Once this was fully mobilized, it was brought out through the GelPort and exteriorized. A GIA was placed across the terminal ileum and fired. This was likewise done across the mid transverse colon. The mesentery of the right colon was then ligated using the LigaSure. A side to side ileocolic anastomosis was then performed using a GIA 75 stapler. The enterotomy was closed using a TA 60 stapler. The staple line was bolstered using 3-0 silk sutures. The bowel  was returned to the abdominal cavity in an orderly fashion. All operating personnel then changed their gown and gloves. The abdominal cavity was copiously irrigated with normal saline. The anastomosis was inspected and a widely patent. All fluid was then evacuated from the abdominal cavity prior to closure. Surgicel and Gelfoam were then placed at the hepatic flexure region in order to maintain hemostasis.  The fascia was reapproximated using a looped 0 PDS running suture. All skin incisions were closed using staples.  Exparel was instilled in the surrounding incisions. Betadine ointment and dry sterile dressings were applied.  All tape and needle counts were correct at the end of the procedure. Patient was extubated in the operating room and transferred to PACU in stable condition.    Complications:  None  EBL:  200 cc  Specimen:  Ascending colon

## 2013-10-20 NOTE — H&P (Signed)
  NTS SOAP Note  Vital Signs:  Vitals as of: 10/19/2013: Systolic 127: Diastolic 89: Heart Rate 76: Temp 97.1F: Height 43ft 2in: Weight 226Lbs 0 Ounces: BMI 29.02  BMI : 29.02 kg/m2  Subjective: This 37 Years 32 Months old Male presents for of colon cancer.  Found on routine TCS by Dr. Darrick Penna, biopsy positive adenocarcinoma at hepatic flexure.  Patient has been otherwise asymptomatic except for abdominal discomfort.  Both parents died from colon cancer.  Review of Symptoms:  Constitutional:unremarkable   Head:unremarkable    Eyes:unremarkable   Nose/Mouth/Throat:unremarkable Cardiovascular:  unremarkable   Respiratory:unremarkable   Gastrointestin    abdominal pain Genitourinary:unremarkable     Musculoskeletal:unremarkable   Skin:unremarkable Hematolgic/Lymphatic:unremarkable     Allergic/Immunologic:unremarkable     Past Medical History:    Reviewed   Past Medical History  Surgical History: carpal tunnel both hands, hemorrhoidectomy Medical Problems: arthritis, GERD, cervical degenerative disease Allergies: nkda Medications: hydrocodone, maxal, ranitidine, lyrica, prilosec, amitiza   Social History:Reviewed  Social History  Preferred Language: English Race:  White Ethnicity: Not Hispanic / Latino Age: 67 Years 7 Months Marital Status:  M Alcohol: no Recreational drug(s): no   Smoking Status: Never smoker reviewed on 10/19/2013 Functional Status reviewed on mm/dd/yyyy ------------------------------------------------ Bathing: Normal Cooking: Normal Dressing: Normal Driving: Normal Eating: Normal Managing Meds: Normal Oral Care: Normal Shopping: Normal Toileting: Normal Transferring: Normal Walking: Normal Cognitive Status reviewed on mm/dd/yyyy ------------------------------------------------ Attention: Normal Decision Making: Normal Language: Normal Memory: Normal Motor: Normal Perception: Normal Problem  Solving: Normal Visual and Spatial: Normal   Family History:  Reviewed  Family Health History Mother, Deceased; Colon cancer;  Father, Deceased; Colon cancer;     Objective Information: General:  Well appearing, well nourished in no distress. Heart:  RRR, no murmur Lungs:    CTA bilaterally, no wheezes, rhonchi, rales.  Breathing unlabored. Breasts:     Abdomen:Soft, NT/ND, normal bowel sounds, no HSM, no masses.  No peritoneal signs.  Dr. Darrick Penna' notes reviewed.  CT scan reviewed, no mets noted.  CEA wnl  Assessment:Colon carcinoma, hepatic flexure    Plan:  Scheduled for laparoscopic handassisted partial colectomy on 10/20/13.     Patient Education:Alternative treatments to surgery were discussed with patient (and family).  Risks and benefits  of procedure including bleeding, infection, blood transfusion, and the possibility of an open procedure were fully explained to the patient (and family) who gave informed consent. Patient/family questions were addressed.  Follow-up:Pending Surgery                             This note has been electronically signed by Dalia Heading MD 10/19/2013 5:26 PM

## 2013-10-20 NOTE — Interval H&P Note (Signed)
History and Physical Interval Note:  10/20/2013 9:01 AM  Eduardo [name]  has presented today for surgery, with the diagnosis of colon cancer  The various methods of treatment have been discussed with the patient and family. After consideration of risks, benefits and other options for treatment, the patient has consented to  Procedure(s):  LAPAROSCOPIC hand assisted partial colectomy (N/A) as a surgical intervention .  The patient's history has been reviewed, patient examined, no change in status, stable for surgery.  I have reviewed the patient's chart and labs.  Questions were answered to the patient's satisfaction.     Franky Macho A

## 2013-10-20 NOTE — Transfer of Care (Signed)
Immediate Anesthesia Transfer of Care Note  Patient: Eduardo [name]  Procedure(s) Performed: Procedure(s):  LAPAROSCOPIC hand assisted partial colectomy (N/A)  Patient Location: PACU  Anesthesia Type:General  Level of Consciousness: sedated and patient cooperative  Airway & Oxygen Therapy: Patient Spontanous Breathing and Patient connected to face mask oxygen  Post-op Assessment: Report given to PACU RN and Post -op Vital signs reviewed and stable  Post vital signs: Reviewed and stable  Complications: No apparent anesthesia complications

## 2013-10-20 NOTE — Anesthesia Postprocedure Evaluation (Signed)
  Anesthesia Post-op Note  Patient: Eduardo [name]  Procedure(s) Performed: Procedure(s):  LAPAROSCOPIC hand assisted partial colectomy (N/A)  Patient Location: PACU  Anesthesia Type:General  Level of Consciousness: sedated and patient cooperative  Airway and Oxygen Therapy: Patient Spontanous Breathing and Patient connected to face mask oxygen  Post-op Pain: none  Post-op Assessment: Post-op Vital signs reviewed, Patient's Cardiovascular Status Stable, Respiratory Function Stable, Patent Airway, No signs of Nausea or vomiting and Pain level controlled  Post-op Vital Signs: Reviewed and stable  Complications: No apparent anesthesia complications

## 2013-10-20 NOTE — Anesthesia Procedure Notes (Signed)
Procedure Name: Intubation Date/Time: 10/20/2013 9:46 AM Performed by: Glynn Octave E Pre-anesthesia Checklist: Patient identified, Patient being monitored, Timeout performed, Emergency Drugs available and Suction available Patient Re-evaluated:Patient Re-evaluated prior to inductionOxygen Delivery Method: Circle System Utilized Preoxygenation: Pre-oxygenation with 100% oxygen Intubation Type: IV induction, Rapid sequence and Cricoid Pressure applied Ventilation: Mask ventilation without difficulty Laryngoscope Size: Mac and 3 Grade View: Grade I Tube type: Oral Tube size: 8.0 mm Number of attempts: 1 Airway Equipment and Method: stylet Placement Confirmation: ETT inserted through vocal cords under direct vision,  positive ETCO2 and breath sounds checked- equal and bilateral Secured at: 21 cm Tube secured with: Tape Dental Injury: Teeth and Oropharynx as per pre-operative assessment

## 2013-10-20 NOTE — Anesthesia Preprocedure Evaluation (Addendum)
Anesthesia Evaluation  Patient identified by MRN, date of birth, ID band Patient awake    Reviewed: Allergy & Precautions, H&P , NPO status , Patient's Chart, lab work & pertinent test results  History of Anesthesia Complications Negative for: history of anesthetic complications  Airway Mallampati: II TM Distance: >3 FB Neck ROM: full    Dental  (+) Teeth Intact   Pulmonary neg pulmonary ROS,  breath sounds clear to auscultation        Cardiovascular negative cardio ROS  Rhythm:Regular Rate:Normal     Neuro/Psych  Headaches,    GI/Hepatic GERD-  Poorly Controlled,  Endo/Other    Renal/GU      Musculoskeletal  (+) Arthritis -, Osteoarthritis,    Abdominal   Peds  Hematology   Anesthesia Other Findings   Reproductive/Obstetrics                          Anesthesia Physical Anesthesia Plan  ASA: II  Anesthesia Plan: General   Post-op Pain Management:    Induction: Intravenous, Rapid sequence and Cricoid pressure planned  Airway Management Planned: Oral ETT  Additional Equipment:   Intra-op Plan:   Post-operative Plan: Extubation in OR  Informed Consent: I have reviewed the patients History and Physical, chart, labs and discussed the procedure including the risks, benefits and alternatives for the proposed anesthesia with the patient or authorized representative who has indicated his/her understanding and acceptance.     Plan Discussed with:   Anesthesia Plan Comments:         Anesthesia Quick Evaluation

## 2013-10-21 LAB — BASIC METABOLIC PANEL
BUN: 13 mg/dL (ref 6–23)
CO2: 26 mEq/L (ref 19–32)
Calcium: 8.6 mg/dL (ref 8.4–10.5)
Chloride: 103 mEq/L (ref 96–112)
Creatinine, Ser: 0.84 mg/dL (ref 0.50–1.35)
GFR calc Af Amer: >90 mL/min (ref >90)
GFR calc non Af Amer: 90 mL/min — ABNORMAL LOW (ref >90)
Glucose, Bld: 102 mg/dL — ABNORMAL HIGH (ref 70–99)
Potassium: 3.8 mEq/L (ref 3.7–5.3)
Sodium: 140 mEq/L (ref 137–147)

## 2013-10-21 LAB — CBC
HCT: 34.5 % — ABNORMAL LOW (ref 39.0–52.0)
Hemoglobin: 11.7 g/dL — ABNORMAL LOW (ref 13.0–17.0)
MCH: 30.1 pg (ref 26.0–34.0)
MCHC: 33.9 g/dL (ref 30.0–36.0)
MCV: 88.7 fL (ref 78.0–100.0)
Platelets: 204 10*3/uL (ref 150–400)
RBC: 3.89 MIL/uL — ABNORMAL LOW (ref 4.22–5.81)
RDW: 13.2 % (ref 11.5–15.5)
WBC: 8.6 10*3/uL (ref 4.0–10.5)

## 2013-10-21 LAB — PHOSPHORUS: Phosphorus: 2.9 mg/dL (ref 2.3–4.6)

## 2013-10-21 LAB — MAGNESIUM: Magnesium: 1.8 mg/dL (ref 1.5–2.5)

## 2013-10-21 MED ORDER — KCL IN DEXTROSE-NACL 20-5-0.45 MEQ/L-%-% IV SOLN
INTRAVENOUS | Status: DC
  Administered 2013-10-21 – 2013-10-23 (×4): via INTRAVENOUS

## 2013-10-21 NOTE — Anesthesia Postprocedure Evaluation (Signed)
  Anesthesia Post-op Note  Patient: Eduardo [name]  Procedure(s) Performed: Procedure(s):  LAPAROSCOPIC hand assisted partial colectomy (N/A)  Patient Location: room 327  Anesthesia Type:General  Level of Consciousness: awake, alert , oriented and patient cooperative  Airway and Oxygen Therapy: Patient Spontanous Breathing  Post-op Pain: 4 /10, moderate  Post-op Assessment: Post-op Vital signs reviewed, Patient's Cardiovascular Status Stable, Respiratory Function Stable, Patent Airway and Pain level controlled  Post-op Vital Signs: Reviewed and stable  Complications: No apparent anesthesia complications

## 2013-10-21 NOTE — Progress Notes (Signed)
1 Day Post-Op  Subjective: Moderate incisional pain, controlled with IV Dilaudid.  Objective: Vital signs in last 24 hours: Temp:  [97.6 F (36.4 C)-98.1 F (36.7 C)] 97.8 F (36.6 C) (01/01 0454) Pulse Rate:  [72-86] 76 (01/01 0454) Resp:  [11-20] 20 (01/01 0454) BP: (97-136)/(57-87) 97/57 mmHg (01/01 0454) SpO2:  [92 %-100 %] 98 % (01/01 0454) Last BM Date: 10/19/13  Intake/Output from previous day: 12/31 0701 - 01/01 0700 In: 4923.3 [P.O.:30; I.V.:4893.3] Out: 1950 [Urine:1750; Blood:200] Intake/Output this shift:    General appearance: alert, cooperative and no distress Resp: clear to auscultation bilaterally Cardio: regular rate and rhythm, S1, S2 normal, no murmur, click, rub or gallop GI: Soft. Dressings dry and intact.  Lab Results:   Recent Labs  10/21/13 0555  WBC 8.6  HGB 11.7*  HCT 34.5*  PLT 204   BMET  Recent Labs  10/21/13 0555  NA 140  K 3.8  CL 103  CO2 26  GLUCOSE 102*  BUN 13  CREATININE 0.84  CALCIUM 8.6   PT/INR No results found for this basename: LABPROT, INR,  in the last 72 hours  Studies/Results: Chest 2 View  10/19/2013   CLINICAL DATA:  Preop for partial colon resection secondary to cancer.  EXAM: CHEST  2 VIEW  COMPARISON:  03/23/2013  FINDINGS: Midline trachea. Borderline cardiomegaly. Mediastinal contours otherwise within normal limits. No pleural effusion or pneumothorax. Clear lungs.  IMPRESSION: No acute cardiopulmonary disease.   Electronically Signed   By: Abigail Miyamoto M.D.   On: 10/19/2013 14:42    Anti-infectives: Anti-infectives   Start     Dose/Rate Route Frequency Ordered Stop   10/20/13 1000  cefoTEtan in Dextrose 5% (CEFOTAN) IVPB 2 g  Status:  Discontinued     2 g Intravenous Every 12 hours 10/20/13 0818 10/20/13 1343   10/20/13 0810  cefoTEtan (CEFOTAN) 1 g in dextrose 5 % 50 mL IVPB  Status:  Discontinued     1 g 100 mL/hr over 30 Minutes Intravenous Every 12 hours 10/20/13 0810 10/20/13 0818       Assessment/Plan: s/p Procedure(s):  LAPAROSCOPIC hand assisted partial colectomy Impression: Stable postoperative day one. Labs were within normal limits. We'll advance to full liquid diet. Foley has been removed. Will adjust IV fluids.  LOS: 1 day    Vicent Febles A 10/21/2013

## 2013-10-22 LAB — BASIC METABOLIC PANEL
BUN: 10 mg/dL (ref 6–23)
CALCIUM: 8.6 mg/dL (ref 8.4–10.5)
CO2: 27 mEq/L (ref 19–32)
Chloride: 102 mEq/L (ref 96–112)
Creatinine, Ser: 0.69 mg/dL (ref 0.50–1.35)
GFR calc non Af Amer: >90 mL/min (ref >90)
Glucose, Bld: 120 mg/dL — ABNORMAL HIGH (ref 70–99)
POTASSIUM: 3.7 meq/L (ref 3.7–5.3)
SODIUM: 138 meq/L (ref 137–147)

## 2013-10-22 LAB — CBC
HCT: 33.7 % — ABNORMAL LOW (ref 39.0–52.0)
Hemoglobin: 11.3 g/dL — ABNORMAL LOW (ref 13.0–17.0)
MCH: 29.8 pg (ref 26.0–34.0)
MCHC: 33.5 g/dL (ref 30.0–36.0)
MCV: 88.9 fL (ref 78.0–100.0)
PLATELETS: 216 10*3/uL (ref 150–400)
RBC: 3.79 MIL/uL — ABNORMAL LOW (ref 4.22–5.81)
RDW: 13.1 % (ref 11.5–15.5)
WBC: 9.7 10*3/uL (ref 4.0–10.5)

## 2013-10-22 NOTE — Progress Notes (Signed)
2 Days Post-Op  Subjective: Having intermittent abdominal cramping with muscle spasms at incision line. States he had a small smear, but no big bowel movement.  Objective: Vital signs in last 24 hours: Temp:  [97.6 F (36.4 C)-98.6 F (37 C)] 97.6 F (36.4 C) (01/02 0628) Pulse Rate:  [73-87] 87 (01/02 0628) Resp:  [18-20] 18 (01/02 0628) BP: (108-127)/(68-76) 108/68 mmHg (01/02 0628) SpO2:  [91 %-95 %] 93 % (01/02 0628) Last BM Date: 10/21/13  Intake/Output from previous day: 01/01 0701 - 01/02 0700 In: 1020 [P.O.:1020] Out: -  Intake/Output this shift:    General appearance: alert, cooperative and no distress Resp: clear to auscultation bilaterally Cardio: regular rate and rhythm, S1, S2 normal, no murmur, click, rub or gallop GI: Soft, dressings dry and intact. Occasional bowel sounds appreciated.  Lab Results:   Recent Labs  10/21/13 0555 10/22/13 0531  WBC 8.6 9.7  HGB 11.7* 11.3*  HCT 34.5* 33.7*  PLT 204 216   BMET  Recent Labs  10/21/13 0555 10/22/13 0531  NA 140 138  K 3.8 3.7  CL 103 102  CO2 26 27  GLUCOSE 102* 120*  BUN 13 10  CREATININE 0.84 0.69  CALCIUM 8.6 8.6   PT/INR No results found for this basename: LABPROT, INR,  in the last 72 hours  Studies/Results: No results found.  Anti-infectives: Anti-infectives   Start     Dose/Rate Route Frequency Ordered Stop   10/20/13 1000  cefoTEtan in Dextrose 5% (CEFOTAN) IVPB 2 g  Status:  Discontinued     2 g Intravenous Every 12 hours 10/20/13 0818 10/20/13 1343   10/20/13 0810  cefoTEtan (CEFOTAN) 1 g in dextrose 5 % 50 mL IVPB  Status:  Discontinued     1 g 100 mL/hr over 30 Minutes Intravenous Every 12 hours 10/20/13 0810 10/20/13 0818      Assessment/Plan: s/p Procedure(s):  LAPAROSCOPIC hand assisted partial colectomy Impression: Stable on postoperative day 2. Awaiting full return of bowel function. Will continue Entereg until bowel  function has fully returned.  LOS: 2 days     Mannie Wineland A 10/22/2013

## 2013-10-23 MED ORDER — MAGNESIUM HYDROXIDE 400 MG/5ML PO SUSP
30.0000 mL | Freq: Two times a day (BID) | ORAL | Status: DC
  Administered 2013-10-23 – 2013-10-24 (×3): 30 mL via ORAL
  Filled 2013-10-23 (×3): qty 30

## 2013-10-23 NOTE — Progress Notes (Signed)
3 Days Post-Op  Subjective: Moderate crampy discomfort in abdomen. Patient is passing flatus but has not had a bowel movement of significance yet.  Objective: Vital signs in last 24 hours: Temp:  [98.2 F (36.8 C)-99.6 F (37.6 C)] 99.6 F (37.6 C) (01/03 0619) Pulse Rate:  [83-94] 94 (01/03 0619) Resp:  [18-20] 20 (01/03 0619) BP: (117-133)/(74-82) 133/82 mmHg (01/03 0619) SpO2:  [94 %-95 %] 94 % (01/03 0619) Last BM Date: 10/21/13  Intake/Output from previous day:   Intake/Output this shift:    General appearance: alert, cooperative and no distress Resp: clear to auscultation bilaterally Cardio: regular rate and rhythm, S1, S2 normal, no murmur, click, rub or gallop GI: Soft. Incisions healing well. Active bowel sounds appreciated.  Lab Results:   Recent Labs  10/21/13 0555 10/22/13 0531  WBC 8.6 9.7  HGB 11.7* 11.3*  HCT 34.5* 33.7*  PLT 204 216   BMET  Recent Labs  10/21/13 0555 10/22/13 0531  NA 140 138  K 3.8 3.7  CL 103 102  CO2 26 27  GLUCOSE 102* 120*  BUN 13 10  CREATININE 0.84 0.69  CALCIUM 8.6 8.6   PT/INR No results found for this basename: LABPROT, INR,  in the last 72 hours  Studies/Results: No results found.  Anti-infectives: Anti-infectives   Start     Dose/Rate Route Frequency Ordered Stop   10/20/13 1000  cefoTEtan in Dextrose 5% (CEFOTAN) IVPB 2 g  Status:  Discontinued     2 g Intravenous Every 12 hours 10/20/13 0818 10/20/13 1343   10/20/13 0810  cefoTEtan (CEFOTAN) 1 g in dextrose 5 % 50 mL IVPB  Status:  Discontinued     1 g 100 mL/hr over 30 Minutes Intravenous Every 12 hours 10/20/13 0810 10/20/13 0818      Assessment/Plan: s/p Procedure(s):  LAPAROSCOPIC hand assisted partial colectomy Impression: Continues to heal well on postoperative day 3. Awaiting full return of bowel function. Patient is encouraged to ambulate. Will advance diet once bowel function has fully returned.  LOS: 3 days    Eduardo [name]  A 10/23/2013

## 2013-10-24 LAB — BASIC METABOLIC PANEL
BUN: 14 mg/dL (ref 6–23)
CO2: 28 mEq/L (ref 19–32)
Calcium: 9.1 mg/dL (ref 8.4–10.5)
Chloride: 97 mEq/L (ref 96–112)
Creatinine, Ser: 0.87 mg/dL (ref 0.50–1.35)
GFR calc Af Amer: >90 mL/min (ref >90)
GFR calc non Af Amer: 89 mL/min — ABNORMAL LOW (ref >90)
GLUCOSE: 103 mg/dL — AB (ref 70–99)
POTASSIUM: 3.4 meq/L — AB (ref 3.7–5.3)
Sodium: 138 mEq/L (ref 137–147)

## 2013-10-24 LAB — CBC
HEMATOCRIT: 37.9 % — AB (ref 39.0–52.0)
HEMOGLOBIN: 12.6 g/dL — AB (ref 13.0–17.0)
MCH: 29.7 pg (ref 26.0–34.0)
MCHC: 33.2 g/dL (ref 30.0–36.0)
MCV: 89.4 fL (ref 78.0–100.0)
Platelets: 338 10*3/uL (ref 150–400)
RBC: 4.24 MIL/uL (ref 4.22–5.81)
RDW: 13.1 % (ref 11.5–15.5)
WBC: 10.9 10*3/uL — ABNORMAL HIGH (ref 4.0–10.5)

## 2013-10-24 MED ORDER — OXYCODONE-ACETAMINOPHEN 7.5-325 MG PO TABS: 1.0000 | ORAL_TABLET | ORAL | Status: DC | PRN

## 2013-10-24 NOTE — Discharge Summary (Signed)
Physician Discharge Summary  Patient ID: Eduardo [name] MRN: [id number] DOB/AGE: July 11, 1946 68 y.o.  Admit date: 10/20/2013 Discharge date: 10/24/2013  Admission Diagnoses: Colon carcinoma  Discharge Diagnoses: Same, T3, N1, M0 adenocarcinoma of the colon  Active Problems:   Colon cancer   Discharged Condition: good  Hospital Course: Patient is a 68 year old white male who was found on colonoscopy to have an adenocarcinoma of the colon at the hepatic flexure. She underwent a laparoscopic hand-assisted right hemicolectomy on 10/20/2013. He tolerated the procedure well. His postoperative course has been for the most part unremarkable. His diet was advanced at difficulty. Final pathology reveals a T3, N1, M0 adenocarcinoma. He is being discharged home on postoperative day 4 in good improving condition.  Treatments: surgery: Laparoscopic hand-assisted right hemicolectomy on 10/20/2013  Discharge Exam: Blood pressure 117/76, pulse 88, temperature 98.8 F (37.1 C), temperature source Oral, resp. rate 20, height 6\' 2"  (1.88 m), weight 102.513 kg (226 lb), SpO2 94.00%. General appearance: alert, cooperative and no distress Resp: clear to auscultation bilaterally Cardio: regular rate and rhythm, S1, S2 normal, no murmur, click, rub or gallop GI: Soft. Incisions healing well. Active bowel sounds appreciated.  Disposition: 01-Home or Self Care     Medication List    STOP taking these medications       HYDROcodone-acetaminophen 5-325 MG per tablet  Commonly known as:  NORCO/VICODIN      TAKE these medications       AMITIZA 24 MCG capsule  Generic drug:  lubiprostone  Take 24 mcg by mouth 2 (two) times daily with a meal.     omeprazole 20 MG capsule  Commonly known as:  PRILOSEC  1 PO 30 MINS PRIOR TO YOUR FIRST MEAL     oxyCODONE-acetaminophen 7.5-325 MG per tablet  Commonly known as:  PERCOCET  Take 1-2 tablets by mouth every 4 (four) hours as needed.     pregabalin 75 MG  capsule  Commonly known as:  LYRICA  Take 75 mg by mouth 3 (three) times daily.     ranitidine 300 MG tablet  Commonly known as:  ZANTAC  Take 300 mg by mouth at bedtime.     rizatriptan 10 MG tablet  Commonly known as:  MAXALT  Take 10 mg by mouth as needed for migraine. May repeat in 2 hours if needed           Follow-up Information   Follow up with Jamesetta So, MD. Schedule an appointment as soon as possible for a visit on 10/28/2013.   Specialty:  General Surgery   Contact information:   [address] [address] [phone]       Signed: Aviva Signs A 10/24/2013, 10:24 AM

## 2013-10-24 NOTE — Discharge Instructions (Signed)
Laparoscopic Colon Resection  Care After Please read the instructions outlined below. Refer to these instructions for the next few weeks. These discharge instructions provide you with general information on caring for yourself after surgery. Your caregiver may also give you specific instructions. While your treatment has been planned according to the most current medical practices available, unavoidable complications occasionally occur. If you have any problems or questions after discharge, please call your caregiver. HOME CARE INSTRUCTIONS   It will be normal to be sore for a couple weeks following surgery. See your caregiver if this seems to be getting worse rather than better.  Only take over-the-counter or prescription medicines for pain, discomfort, or fever as directed by your caregiver.  Use showers for bathing, until seen or as instructed.  Change dressings if necessary or as directed.  You may resume normal diet and activities as directed or allowed.  Avoid lifting or driving until you are instructed otherwise.  Make an appointment to see your surgeon for suture (stitches) or staple removal when instructed.  You may use ice on your incision for twenty minutes four times per day for the first two days. SEEK MEDICAL CARE IF:   You have increased bleeding from wounds.  You see redness, swelling, or have increasing pain in wounds.  You have pus coming from a wound.  You develop an unexplained temperature over 101 F (38.3 C).  You have a foul smell coming from the wound or dressing.  There is a breaking open of a wound (edges not staying together) after sutures or staples have been removed. SEEK IMMEDIATE MEDICAL CARE IF:  You develop a rash.  You have difficulty breathing  You develop any reaction or side effects to medications given.  You develop lightheadedness or feel faint.  Your abdomen becomes distended (gets larger).  You develop nausea or vomiting or pass  blood from the bowels. Document Released: 04/26/2005 Document Revised: 12/30/2011 Document Reviewed: 11/06/2007 [facility] Patient Information 2014 [place], [place].

## 2013-10-24 NOTE — Progress Notes (Signed)
Patient discharged home with wife.  IV removed - WNL.  Instructed on new pain medication and how to take.  Follow up in place with Dr. Arnoldo Morale for 1/8.  Educated on s/s infection and instructed on incisional care to prevent infection.  Patient verbalizes understanding using teach back.  Patient has no questions at this time.  Stable to go home, left floor via WC with NT.

## 2013-10-25 ENCOUNTER — Encounter (HOSPITAL_COMMUNITY): Payer: Self-pay | Admitting: General Surgery

## 2013-10-25 NOTE — Progress Notes (Signed)
UR chart review completed.  

## 2013-10-27 ENCOUNTER — Inpatient Hospital Stay (HOSPITAL_COMMUNITY)
Admission: EM | Admit: 2013-10-27 | Discharge: 2013-11-11 | DRG: 393 | Disposition: A | Discharge status: Home Health | Payer: Managed Care, Other (non HMO) | Attending: Surgery | Admitting: Surgery

## 2013-10-27 ENCOUNTER — Emergency Department (HOSPITAL_COMMUNITY): Payer: Managed Care, Other (non HMO)

## 2013-10-27 ENCOUNTER — Encounter (HOSPITAL_COMMUNITY): Payer: Self-pay | Admitting: Emergency Medicine

## 2013-10-27 DIAGNOSIS — G589 Mononeuropathy, unspecified: Secondary | ICD-10-CM | POA: Diagnosis present

## 2013-10-27 DIAGNOSIS — Z889 Allergy status to unspecified drugs, medicaments and biological substances status: Secondary | ICD-10-CM

## 2013-10-27 DIAGNOSIS — Z8371 Family history of colonic polyps: Secondary | ICD-10-CM

## 2013-10-27 DIAGNOSIS — K631 Perforation of intestine (nontraumatic): Secondary | ICD-10-CM

## 2013-10-27 DIAGNOSIS — B370 Candidal stomatitis: Secondary | ICD-10-CM | POA: Diagnosis present

## 2013-10-27 DIAGNOSIS — E43 Unspecified severe protein-calorie malnutrition: Secondary | ICD-10-CM | POA: Diagnosis present

## 2013-10-27 DIAGNOSIS — Y832 Surgical operation with anastomosis, bypass or graft as the cause of abnormal reaction of the patient, or of later complication, without mention of misadventure at the time of the procedure: Secondary | ICD-10-CM | POA: Diagnosis present

## 2013-10-27 DIAGNOSIS — K929 Disease of digestive system, unspecified: Principal | ICD-10-CM | POA: Diagnosis present

## 2013-10-27 DIAGNOSIS — F3289 Other specified depressive episodes: Secondary | ICD-10-CM | POA: Diagnosis not present

## 2013-10-27 DIAGNOSIS — K651 Peritoneal abscess: Secondary | ICD-10-CM | POA: Diagnosis present

## 2013-10-27 DIAGNOSIS — K219 Gastro-esophageal reflux disease without esophagitis: Secondary | ICD-10-CM | POA: Diagnosis present

## 2013-10-27 DIAGNOSIS — Z83719 Family history of colon polyps, unspecified: Secondary | ICD-10-CM

## 2013-10-27 DIAGNOSIS — R259 Unspecified abnormal involuntary movements: Secondary | ICD-10-CM | POA: Diagnosis not present

## 2013-10-27 DIAGNOSIS — Y9210 Unspecified residential institution as the place of occurrence of the external cause: Secondary | ICD-10-CM | POA: Diagnosis present

## 2013-10-27 DIAGNOSIS — K668 Other specified disorders of peritoneum: Secondary | ICD-10-CM | POA: Diagnosis present

## 2013-10-27 DIAGNOSIS — J029 Acute pharyngitis, unspecified: Secondary | ICD-10-CM | POA: Diagnosis present

## 2013-10-27 DIAGNOSIS — F329 Major depressive disorder, single episode, unspecified: Secondary | ICD-10-CM | POA: Diagnosis not present

## 2013-10-27 DIAGNOSIS — M5030 Other cervical disc degeneration, unspecified cervical region: Secondary | ICD-10-CM | POA: Diagnosis present

## 2013-10-27 DIAGNOSIS — Z85038 Personal history of other malignant neoplasm of large intestine: Secondary | ICD-10-CM

## 2013-10-27 DIAGNOSIS — M129 Arthropathy, unspecified: Secondary | ICD-10-CM | POA: Diagnosis present

## 2013-10-27 DIAGNOSIS — Z9049 Acquired absence of other specified parts of digestive tract: Secondary | ICD-10-CM

## 2013-10-27 HISTORY — DX: Malignant neoplasm of colon, unspecified: C18.9

## 2013-10-27 LAB — CBC WITH DIFFERENTIAL/PLATELET
BASOS PCT: 0 % (ref 0–1)
Basophils Absolute: 0 10*3/uL (ref 0.0–0.1)
EOS ABS: 0 10*3/uL (ref 0.0–0.7)
EOS PCT: 0 % (ref 0–5)
HEMATOCRIT: 33.9 % — AB (ref 39.0–52.0)
HEMOGLOBIN: 11.8 g/dL — AB (ref 13.0–17.0)
Lymphocytes Relative: 7 % — ABNORMAL LOW (ref 12–46)
Lymphs Abs: 0.6 10*3/uL — ABNORMAL LOW (ref 0.7–4.0)
MCH: 29.9 pg (ref 26.0–34.0)
MCHC: 34.8 g/dL (ref 30.0–36.0)
MCV: 85.8 fL (ref 78.0–100.0)
MONO ABS: 1.9 10*3/uL — AB (ref 0.1–1.0)
MONOS PCT: 17 % — AB (ref 3–12)
Neutro Abs: 8.2 10*3/uL — ABNORMAL HIGH (ref 1.7–7.7)
Neutrophils Relative %: 76 % (ref 43–77)
Platelets: 400 10*3/uL (ref 150–400)
RBC: 3.95 MIL/uL — ABNORMAL LOW (ref 4.22–5.81)
RDW: 12.9 % (ref 11.5–15.5)
WBC: 10.8 10*3/uL — ABNORMAL HIGH (ref 4.0–10.5)

## 2013-10-27 LAB — COMPREHENSIVE METABOLIC PANEL
ALBUMIN: 2.8 g/dL — AB (ref 3.5–5.2)
ALT: 20 U/L (ref 0–53)
AST: 21 U/L (ref 0–37)
Alkaline Phosphatase: 98 U/L (ref 39–117)
BUN: 26 mg/dL — AB (ref 6–23)
CO2: 26 mEq/L (ref 19–32)
Calcium: 8.9 mg/dL (ref 8.4–10.5)
Chloride: 96 mEq/L (ref 96–112)
Creatinine, Ser: 0.64 mg/dL (ref 0.50–1.35)
GFR calc Af Amer: >90 mL/min (ref >90)
GFR calc non Af Amer: >90 mL/min (ref >90)
Glucose, Bld: 110 mg/dL — ABNORMAL HIGH (ref 70–99)
Potassium: 3.1 mEq/L — ABNORMAL LOW (ref 3.7–5.3)
Sodium: 137 mEq/L (ref 137–147)
TOTAL PROTEIN: 7.4 g/dL (ref 6.0–8.3)
Total Bilirubin: 0.6 mg/dL (ref 0.3–1.2)

## 2013-10-27 LAB — LIPASE, BLOOD: LIPASE: 31 U/L (ref 11–59)

## 2013-10-27 MED ORDER — POTASSIUM CHLORIDE CRYS ER 20 MEQ PO TBCR
40.0000 meq | EXTENDED_RELEASE_TABLET | Freq: Once | ORAL | Status: AC
  Administered 2013-10-27: 40 meq via ORAL
  Filled 2013-10-27: qty 2

## 2013-10-27 MED ORDER — IOHEXOL 300 MG/ML  SOLN
100.0000 mL | Freq: Once | INTRAMUSCULAR | Status: AC | PRN
  Administered 2013-10-27: 100 mL via INTRAVENOUS

## 2013-10-27 MED ORDER — HYDROMORPHONE HCL PF 1 MG/ML IJ SOLN
1.0000 mg | Freq: Once | INTRAMUSCULAR | Status: AC
  Administered 2013-10-27: 1 mg via INTRAVENOUS
  Filled 2013-10-27: qty 1

## 2013-10-27 MED ORDER — ACETAMINOPHEN 325 MG PO TABS
650.0000 mg | ORAL_TABLET | Freq: Four times a day (QID) | ORAL | Status: DC | PRN
  Administered 2013-11-08 – 2013-11-10 (×4): 650 mg via ORAL
  Filled 2013-10-27 (×3): qty 2

## 2013-10-27 MED ORDER — PREGABALIN 75 MG PO CAPS
75.0000 mg | ORAL_CAPSULE | Freq: Every evening | ORAL | Status: DC
  Filled 2013-10-27 (×4): qty 1

## 2013-10-27 MED ORDER — FLUCONAZOLE IN SODIUM CHLORIDE 200-0.9 MG/100ML-% IV SOLN
200.0000 mg | INTRAVENOUS | Status: DC
  Filled 2013-10-27: qty 100

## 2013-10-27 MED ORDER — SODIUM CHLORIDE 0.9 % IV BOLUS (SEPSIS)
1000.0000 mL | Freq: Once | INTRAVENOUS | Status: AC
  Administered 2013-10-27: 1000 mL via INTRAVENOUS

## 2013-10-27 MED ORDER — FENTANYL CITRATE 0.05 MG/ML IJ SOLN
50.0000 ug | INTRAMUSCULAR | Status: DC | PRN
  Administered 2013-10-28 (×4): 50 ug via INTRAVENOUS
  Filled 2013-10-27 (×4): qty 2

## 2013-10-27 MED ORDER — ONDANSETRON HCL 4 MG/2ML IJ SOLN
4.0000 mg | Freq: Four times a day (QID) | INTRAMUSCULAR | Status: DC | PRN
  Administered 2013-10-28 – 2013-10-29 (×2): 4 mg via INTRAVENOUS
  Filled 2013-10-27 (×2): qty 2

## 2013-10-27 MED ORDER — IOHEXOL 300 MG/ML  SOLN
50.0000 mL | Freq: Once | INTRAMUSCULAR | Status: AC | PRN
  Administered 2013-10-27: 50 mL via ORAL

## 2013-10-27 MED ORDER — DIPHENHYDRAMINE HCL 12.5 MG/5ML PO ELIX: 12.5000 mg | ORAL_SOLUTION | Freq: Four times a day (QID) | ORAL | Status: DC | PRN

## 2013-10-27 MED ORDER — PIPERACILLIN-TAZOBACTAM 3.375 G IVPB
3.3750 g | Freq: Once | INTRAVENOUS | Status: AC
  Administered 2013-10-27: 3.375 g via INTRAVENOUS
  Filled 2013-10-27: qty 50

## 2013-10-27 MED ORDER — ALBUTEROL SULFATE (2.5 MG/3ML) 0.083% IN NEBU: 3.0000 mL | INHALATION_SOLUTION | Freq: Four times a day (QID) | RESPIRATORY_TRACT | Status: DC | PRN

## 2013-10-27 MED ORDER — POTASSIUM CHLORIDE CRYS ER 20 MEQ PO TBCR
EXTENDED_RELEASE_TABLET | ORAL | Status: AC
  Filled 2013-10-27: qty 1

## 2013-10-27 MED ORDER — PIPERACILLIN-TAZOBACTAM 3.375 G IVPB
3.3750 g | Freq: Three times a day (TID) | INTRAVENOUS | Status: DC
  Administered 2013-10-28 – 2013-11-03 (×20): 3.375 g via INTRAVENOUS
  Filled 2013-10-27 (×24): qty 50

## 2013-10-27 MED ORDER — DIPHENHYDRAMINE HCL 50 MG/ML IJ SOLN: 12.5000 mg | Freq: Four times a day (QID) | INTRAMUSCULAR | Status: DC | PRN

## 2013-10-27 MED ORDER — FLUTICASONE PROPIONATE 50 MCG/ACT NA SUSP
2.0000 | Freq: Every day | NASAL | Status: DC
  Administered 2013-11-10 – 2013-11-11 (×2): 2 via NASAL
  Filled 2013-10-27 (×2): qty 16

## 2013-10-27 MED ORDER — ACETAMINOPHEN 650 MG RE SUPP: 650.0000 mg | Freq: Four times a day (QID) | RECTAL | Status: DC | PRN

## 2013-10-27 MED ORDER — KCL IN DEXTROSE-NACL 40-5-0.45 MEQ/L-%-% IV SOLN
INTRAVENOUS | Status: DC
  Administered 2013-10-28 – 2013-11-03 (×6): via INTRAVENOUS
  Filled 2013-10-27 (×3): qty 1000

## 2013-10-27 MED ORDER — ONDANSETRON HCL 4 MG/2ML IJ SOLN
4.0000 mg | Freq: Once | INTRAMUSCULAR | Status: AC
  Administered 2013-10-27: 4 mg via INTRAVENOUS
  Filled 2013-10-27: qty 2

## 2013-10-27 MED ORDER — ENOXAPARIN SODIUM 40 MG/0.4ML ~~LOC~~ SOLN: 40.0000 mg | SUBCUTANEOUS | Status: DC

## 2013-10-27 MED ORDER — FENTANYL CITRATE 0.05 MG/ML IJ SOLN
50.0000 ug | Freq: Once | INTRAMUSCULAR | Status: AC
  Administered 2013-10-27: 50 ug via INTRAVENOUS
  Filled 2013-10-27: qty 2

## 2013-10-27 NOTE — ED Provider Notes (Signed)
CSN: [id number]     Arrival date & time 10/27/13  1817 History   First MD Initiated Contact with Patient 10/27/13 1845     Chief Complaint  Patient presents with  . Weakness   (Consider location/radiation/quality/duration/timing/severity/associated sxs/prior Treatment) Patient is a 68 y.o. male presenting with weakness.  Weakness   68 year old male status post right hemicolectomy for cancer one week ago. He was discharged on Sunday. He has had some worsened pain and nausea today. He has taken very little by mouth in the interim. By report from Dr. Arnoldo Morale the patient had a poor reaction to Percocet today and then discontinued it. The patient now has worsened pain. He has had some vomiting. He has been passing gas but has not had a bowel movement. He has not noted any fever or chills. He complains of sore throat that began during his hospitalization but has worsened over the past several days. Past Medical History  Diagnosis Date  . Arthritis   . DDD (degenerative disc disease), cervical   . Wears glasses   . GERD (gastroesophageal reflux disease)   . Headache(784.0)   . Neuropathy 2010  . Colon cancer 10/20/13    colon resection   Past Surgical History  Procedure Laterality Date  . Hemorrhoid surgery    . Carpal tunnel release  2011    left  . Knee arthroscopy  2001    rt and lt  . Upper gi endoscopy    . Colonoscopy    . Inguinal hernia repair      left x2  . Inguinal hernia repair      right  . Tonsillectomy    . Carpal tunnel release Right 01/07/2013    Procedure: CARPAL TUNNEL RELEASE;  Surgeon: Tennis Must, MD;  Location: [place];  Service: Orthopedics;  Laterality: Right;  . Colonoscopy N/A 10/18/2013    Procedure: COLONOSCOPY;  Surgeon: Danie Binder, MD;  Location: [facility];  Service: Endoscopy;  Laterality: N/A;  2:00-moved to 1030 Pam to notify pt  . Colon resection N/A 10/20/2013    Procedure:  LAPAROSCOPIC hand assisted partial colectomy;   Surgeon: Jamesetta So, MD;  Location: [facility];  Service: General;  Laterality: N/A;   Family History  Problem Relation Age of Onset  . Colon polyps Father   . Colon polyps Paternal Uncle   . Colon polyps Maternal Grandfather    History  Substance Use Topics  . Smoking status: Never Smoker   . Smokeless tobacco: Not on file  . Alcohol Use: No    Review of Systems  Neurological: Positive for weakness.  All other systems reviewed and are negative.    Allergies  Niaspan  Home Medications   Current Outpatient Rx  Name  Route  Sig  Dispense  Refill  . AMITIZA 24 MCG capsule   Oral   Take 24 mcg by mouth 2 (two) times daily with a meal.          . benzonatate (TESSALON) 100 MG capsule   Oral   Take 100 mg by mouth 2 (two) times daily as needed. For cough         . fluticasone (FLONASE) 50 MCG/ACT nasal spray   Each Nare   Place 2 sprays into both nostrils daily.         Marland Kitchen HYDROcodone-acetaminophen (NORCO/VICODIN) 5-325 MG per tablet   Oral   Take 1 tablet by mouth daily as needed. For pain         .  omeprazole (PRILOSEC) 20 MG capsule   Oral   Take 20 mg by mouth every morning.         Marland Kitchen oxyCODONE-acetaminophen (PERCOCET) 7.5-325 MG per tablet   Oral   Take 1-2 tablets by mouth every 4 (four) hours as needed.   50 tablet   0   . pregabalin (LYRICA) 75 MG capsule   Oral   Take 75 mg by mouth every evening.          . ranitidine (ZANTAC) 300 MG tablet   Oral   Take 300 mg by mouth at bedtime.          . VENTOLIN HFA 108 (90 BASE) MCG/ACT inhaler   Inhalation   Inhale 1-2 puffs into the lungs every 6 (six) hours as needed. For shortness of breath/wheezing         . ondansetron (ZOFRAN-ODT) 4 MG disintegrating tablet   Oral   Take 4 mg by mouth 4 (four) times daily as needed. For nausea/vomiting         . rizatriptan (MAXALT) 10 MG tablet   Oral   Take 10 mg by mouth as needed for migraine. May repeat in 2 hours if needed           BP 125/79  Pulse 92  Temp(Src) 97.9 F (36.6 C) (Oral)  Resp 22  Ht 6\' 2"  (1.88 m)  Wt 224 lb (101.606 kg)  BMI 28.75 kg/m2  SpO2 95% Physical Exam  Nursing note and vitals reviewed. Constitutional: He is oriented to person, place, and time. He appears well-developed and well-nourished.  Uncomfortable appear  HENT:  Head: Normocephalic and atraumatic.  Right Ear: External ear normal.  Left Ear: External ear normal.  Mucous membranes are dry with erythematous, and oropharynx  Eyes: Conjunctivae and EOM are normal. Pupils are equal, round, and reactive to light.  Neck: Normal range of motion. Neck supple.  Cardiovascular: Normal rate, regular rhythm, normal heart sounds and intact distal pulses.   Pulmonary/Chest: Effort normal and breath sounds normal.  Abdominal: Soft. Bowel sounds are normal. He exhibits no mass. There is tenderness. There is no rebound and no guarding.  Moderate diffuse tenderness to palpation  Musculoskeletal: Normal range of motion. He exhibits no edema.  Neurological: He is alert and oriented to person, place, and time. He has normal reflexes.  Skin: Skin is warm and dry.  Psychiatric: He has a normal mood and affect. His behavior is normal. Judgment and thought content normal.    ED Course  Procedures (including critical care time) Labs Review Labs Reviewed  CBC WITH DIFFERENTIAL - Abnormal; Notable for the following:    WBC 10.8 (*)    RBC 3.95 (*)    Hemoglobin 11.8 (*)    HCT 33.9 (*)    Neutro Abs 8.2 (*)    Lymphocytes Relative 7 (*)    Lymphs Abs 0.6 (*)    Monocytes Relative 17 (*)    Monocytes Absolute 1.9 (*)    All other components within normal limits  COMPREHENSIVE METABOLIC PANEL - Abnormal; Notable for the following:    Potassium 3.1 (*)    Glucose, Bld 110 (*)    BUN 26 (*)    Albumin 2.8 (*)    All other components within normal limits  LIPASE, BLOOD  URINALYSIS, ROUTINE W REFLEX MICROSCOPIC   Imaging Review No results  found.  EKG Interpretation   None       MDM  Radiologist called with report  of bowel perforation, abscess formation.. I have paged Dr. Arnoldo Morale. Patient will be started on IV antibiotics to cover abdominal pathogens.Discussed with Dr. Arnoldo Morale and he is en route.   Shaune Pollack, MD 10/27/13 [phone]

## 2013-10-27 NOTE — H&P (Signed)
Eduardo [name] is an 68 y.o. male.   Chief Complaint: Nausea, not feeling well HPI: Patient is a 68 year old white male status post a right hemicolectomy for colon cancer on 10/20/2013 who presented to emergency room today with nausea and just not feeling well. He states he has been having bowel movements and had 1 episode of emesis after taking the Percocet. He denies any fever or chills. He does have a sore throat. His appetite is decreased. CT scan the abdomen reveals an intra-abdominal abscess around the anastomosis. A small amount of pneumoperitoneum is present.  Past Medical History  Diagnosis Date  . Arthritis   . DDD (degenerative disc disease), cervical   . Wears glasses   . GERD (gastroesophageal reflux disease)   . Headache(784.0)   . Neuropathy 2010  . Colon cancer 10/20/13    colon resection    Past Surgical History  Procedure Laterality Date  . Hemorrhoid surgery    . Carpal tunnel release  2011    left  . Knee arthroscopy  2001    rt and lt  . Upper gi endoscopy    . Colonoscopy    . Inguinal hernia repair      left x2  . Inguinal hernia repair      right  . Tonsillectomy    . Carpal tunnel release Right 01/07/2013    Procedure: CARPAL TUNNEL RELEASE;  Surgeon: Tennis Must, MD;  Location: [place];  Service: Orthopedics;  Laterality: Right;  . Colonoscopy N/A 10/18/2013    Procedure: COLONOSCOPY;  Surgeon: Danie Binder, MD;  [facility];  Service: Endoscopy;  Laterality: N/A;  2:00-moved to 1030 Pam to notify pt  . Colon resection N/A 10/20/2013    Procedure:  LAPAROSCOPIC hand assisted partial colectomy;  Surgeon: Jamesetta So, MD;  Location: [facility];  Service: General;  Laterality: N/A;    Family History  Problem Relation Age of Onset  . Colon polyps Father   . Colon polyps Paternal Uncle   . Colon polyps Maternal Grandfather    Social History:  reports that he has never smoked. He does not have any smokeless tobacco history  on file. He reports that he does not drink alcohol or use illicit drugs.  Allergies:  Allergies  Allergen Reactions  . Niaspan [Niacin Er] Rash     (Not in a hospital admission)  Results for orders placed during the hospital encounter of 10/27/13 (from the past 48 hour(s))  CBC WITH DIFFERENTIAL     Status: Abnormal   Collection Time    10/27/13  7:15 PM      Result Value Range   WBC 10.8 (*) 4.0 - 10.5 K/uL   RBC 3.95 (*) 4.22 - 5.81 MIL/uL   Hemoglobin 11.8 (*) 13.0 - 17.0 g/dL   HCT 33.9 (*) 39.0 - 52.0 %   MCV 85.8  78.0 - 100.0 fL   MCH 29.9  26.0 - 34.0 pg   MCHC 34.8  30.0 - 36.0 g/dL   RDW 12.9  11.5 - 15.5 %   Platelets 400  150 - 400 K/uL   Neutrophils Relative % 76  43 - 77 %   Neutro Abs 8.2 (*) 1.7 - 7.7 K/uL   Lymphocytes Relative 7 (*) 12 - 46 %   Lymphs Abs 0.6 (*) 0.7 - 4.0 K/uL   Monocytes Relative 17 (*) 3 - 12 %   Monocytes Absolute 1.9 (*) 0.1 - 1.0 K/uL  Eosinophils Relative 0  0 - 5 %   Eosinophils Absolute 0.0  0.0 - 0.7 K/uL   Basophils Relative 0  0 - 1 %   Basophils Absolute 0.0  0.0 - 0.1 K/uL  COMPREHENSIVE METABOLIC PANEL     Status: Abnormal   Collection Time    10/27/13  7:15 PM      Result Value Range   Sodium 137  137 - 147 mEq/L   Potassium 3.1 (*) 3.7 - 5.3 mEq/L   Chloride 96  96 - 112 mEq/L   CO2 26  19 - 32 mEq/L   Glucose, Bld 110 (*) 70 - 99 mg/dL   BUN 26 (*) 6 - 23 mg/dL   Creatinine, Ser 0.64  0.50 - 1.35 mg/dL   Calcium 8.9  8.4 - 10.5 mg/dL   Total Protein 7.4  6.0 - 8.3 g/dL   Albumin 2.8 (*) 3.5 - 5.2 g/dL   AST 21  0 - 37 U/L   ALT 20  0 - 53 U/L   Alkaline Phosphatase 98  39 - 117 U/L   Total Bilirubin 0.6  0.3 - 1.2 mg/dL   GFR calc non Af Amer >90  >90 mL/min   GFR calc Af Amer >90  >90 mL/min   Comment: (NOTE)     The eGFR has been calculated using the CKD EPI equation.     This calculation has not been validated in all clinical situations.     eGFR's persistently <90 mL/min signify possible Chronic Kidney      Disease.  LIPASE, BLOOD     Status: None   Collection Time    10/27/13  7:15 PM      Result Value Range   Lipase 31  11 - 59 U/L   Ct Abdomen Pelvis W Contrast  10/27/2013   CLINICAL DATA:  Abdominal pain. History of colon cancer status post resection 10/20/2013  EXAM: CT ABDOMEN AND PELVIS WITH CONTRAST  TECHNIQUE: Multidetector CT imaging of the abdomen and pelvis was performed using the standard protocol following bolus administration of intravenous contrast.  CONTRAST:  76m OMNIPAQUE IOHEXOL 300 MG/ML SOLN, 1014mOMNIPAQUE IOHEXOL 300 MG/ML SOLN  COMPARISON:  10/11/2013  FINDINGS: BODY WALL: Recent laparotomy changes  LOWER CHEST: There are small bilateral pleural effusions and basilar opacities with volume loss, most compatible with atelectasis. High-density material within the posterior costophrenic sulcus on the left, favored to be pre-existing mineralization, possibly more prominent in the setting of collapse. There were a few calcifications noted on the previous exam.  ABDOMEN/PELVIS:  Liver: 13 mm low-attenuation abnormality upper left lobe is stable from prior, likely cyst rather than metastatic deposit.  Biliary: No evidence of biliary obstruction or stone.  Pancreas: Unremarkable.  Spleen: Unremarkable.  Adrenals: Unremarkable.  Kidneys and ureters: 1 cm low dense lesion the upper pole right kidney is presumably a cyst, although small size limits certainty.  Bladder: Unremarkable.  Reproductive: Unremarkable.  Bowel: Interval right hemicolectomy with ileocolonic anastomosis. At the anastomosis, there is a gas and fluid collection measuring 10 cm. Pneumoperitoneum is present, moderate volume. Small volume ascites, likely from peritonitis. Diffuse dilation of small and large bowel consistent with ileus.  Retroperitoneum: No mass or adenopathy.  Vascular: No acute abnormality.  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 Sclerosis in the anterior right femoral head neck  junction is possibly a bone island, stable.  Critical Value/emergent results were called by telephone at the time of interpretation on 10/27/2013  at 9:45 PM to Dr. Pattricia Boss , who verbally acknowledged these results.  IMPRESSION: 1. Enterocolonic anastomotic breakdown with 10 cm abscess and pneumoperitoneum. 2. Reactive ileus. 3. Bibasilar atelectasis with small effusions.   Electronically Signed   By: Jorje Guild M.D.   On: 10/27/2013 21:47    Review of Systems  Constitutional: Positive for malaise/fatigue. Negative for fever and chills.  HENT: Negative.   Eyes: Negative.   Respiratory: Negative.   Cardiovascular: Negative.   Gastrointestinal: Positive for nausea.  Genitourinary: Negative.   Musculoskeletal: Negative.   All other systems reviewed and are negative.    Blood pressure 125/79, pulse 92, temperature 97.9 F (36.6 C), temperature source Oral, resp. rate 22, height 6' 2"  (1.88 m), weight 101.606 kg (224 lb), SpO2 95.00%. Physical Exam  Constitutional: He is oriented to person, place, and time. He appears well-developed and well-nourished.  HENT:  Head: Normocephalic and atraumatic.  Neck: Normal range of motion. Neck supple.  Cardiovascular: Normal rate, regular rhythm and normal heart sounds.   Respiratory: Effort normal and breath sounds normal.  GI: Soft. He exhibits no distension. There is tenderness.  Slightly tender to palpation along the right side of the abdomen. No significant distention or rigidity noted. No peritoneal signs noted. Minimal bowel sounds.  Neurological: He is alert and oriented to person, place, and time.  Skin: Skin is warm and dry.     Assessment/Plan Impression: Abdominal abscess from probable anastomotic leak, status post right hemicolectomy for colon cancer one week ago As patient's white blood cell count was not significantly elevated and he does not have peritoneal signs, 1 minute the patient the hospital for IV antibiotics and bowel  rest. He does not need acute operative intervention at this time. He will be undergo interventional radiologic image guided drainage of his intra-abdominal abscess in [place] and will be transported by [facility] as we do not have the facilities to perform this procedure. He will return back [facility] after this has been done. Should he continue to have a persistent leak through the catheter, he may end up needing a return to the operating room for an anastomotic revision. All this has been explained to the patient and family, who agree to the treatment plan.  Sanjay Broadfoot A 10/27/2013, 10:31 PM

## 2013-10-27 NOTE — ED Notes (Signed)
Pt c/o feeling weak; pt had surgery to remove part of colon for colon cancer; pt c/o diarrhea and n/v

## 2013-10-27 NOTE — ED Notes (Signed)
Pt reports n/v and loose stools since he had abd surg for colectomy on 10/20/2013 - pt states he is unable to tolerate food and the smell of food makes him nauseous, reports he tolerates PO fluids - pt denies any pain at present, admits to some discomfort at surgical incision sites w/ coughing. Pt denies any known fever. Incision sites appear to be healing well, edges approximated - patchy redness noted to areas around incisions which pt reports is due to a reaction to the adhesive.

## 2013-10-28 ENCOUNTER — Encounter (HOSPITAL_COMMUNITY): Payer: Self-pay

## 2013-10-28 ENCOUNTER — Ambulatory Visit (HOSPITAL_COMMUNITY): Payer: Managed Care, Other (non HMO)

## 2013-10-28 ENCOUNTER — Encounter (HOSPITAL_COMMUNITY): Payer: Self-pay | Admitting: *Deleted

## 2013-10-28 LAB — PROTIME-INR
INR: 1.21 (ref 0.00–1.49)
PROTHROMBIN TIME: 15 s (ref 11.6–15.2)

## 2013-10-28 MED ORDER — FENTANYL CITRATE 0.05 MG/ML IJ SOLN
INTRAMUSCULAR | Status: AC | PRN
  Administered 2013-10-28 (×2): 25 ug via INTRAVENOUS

## 2013-10-28 MED ORDER — FENTANYL CITRATE 0.05 MG/ML IJ SOLN
100.0000 ug | INTRAMUSCULAR | Status: DC | PRN
  Administered 2013-10-28 – 2013-11-11 (×113): 100 ug via INTRAVENOUS
  Filled 2013-10-28 (×113): qty 2

## 2013-10-28 MED ORDER — LIDOCAINE HCL 1 % IJ SOLN
INTRAMUSCULAR | Status: AC
  Filled 2013-10-28: qty 10

## 2013-10-28 MED ORDER — FLUCONAZOLE IN SODIUM CHLORIDE 200-0.9 MG/100ML-% IV SOLN
200.0000 mg | INTRAVENOUS | Status: DC
  Administered 2013-10-28 – 2013-11-03 (×7): 200 mg via INTRAVENOUS
  Filled 2013-10-28 (×8): qty 100

## 2013-10-28 MED ORDER — MIDAZOLAM HCL 2 MG/2ML IJ SOLN
INTRAMUSCULAR | Status: AC
  Filled 2013-10-28: qty 4

## 2013-10-28 MED ORDER — MIDAZOLAM HCL 2 MG/2ML IJ SOLN
INTRAMUSCULAR | Status: AC | PRN
  Administered 2013-10-28 (×3): 1 mg via INTRAVENOUS

## 2013-10-28 MED ORDER — FENTANYL CITRATE 0.05 MG/ML IJ SOLN
INTRAMUSCULAR | Status: AC
  Administered 2013-10-28: 100 ug
  Filled 2013-10-28: qty 2

## 2013-10-28 MED ORDER — FENTANYL CITRATE 0.05 MG/ML IJ SOLN
INTRAMUSCULAR | Status: AC
  Filled 2013-10-28: qty 4

## 2013-10-28 MED ORDER — FLUCONAZOLE IN SODIUM CHLORIDE 200-0.9 MG/100ML-% IV SOLN
INTRAVENOUS | Status: AC
  Filled 2013-10-28: qty 100

## 2013-10-28 MED ORDER — PIPERACILLIN-TAZOBACTAM 3.375 G IVPB
INTRAVENOUS | Status: AC
  Filled 2013-10-28: qty 50

## 2013-10-28 NOTE — Progress Notes (Signed)
UR chart review completed.  

## 2013-10-28 NOTE — Progress Notes (Signed)
Subjective: Still uncomfortable but no change from last night.  Objective: Vital signs in last 24 hours: Temp:  [97.9 F (36.6 C)-99.1 F (37.3 C)] 99.1 F (37.3 C) (01/08 0414) Pulse Rate:  [79-92] 81 (01/08 0414) Resp:  [18-22] 18 (01/08 0414) BP: (119-128)/(72-82) 122/72 mmHg (01/08 0414) SpO2:  [93 %-96 %] 96 % (01/08 0414) Weight:  [101.606 kg (224 lb)] 101.606 kg (224 lb) (01/07 1824)    Intake/Output from previous day:   Intake/Output this shift:    General appearance: alert, cooperative and no distress GI: Soft, minimal tenderness noted on the right side the abdomen. No rigidity noted.  Lab Results:   Recent Labs  10/27/13 1915  WBC 10.8*  HGB 11.8*  HCT 33.9*  PLT 400   BMET  Recent Labs  10/27/13 1915  NA 137  K 3.1*  CL 96  CO2 26  GLUCOSE 110*  BUN 26*  CREATININE 0.64  CALCIUM 8.9   PT/INR  Recent Labs  10/28/13 0007  LABPROT 15.0  INR 1.21    Studies/Results: Ct Abdomen Pelvis W Contrast  10/27/2013   CLINICAL DATA:  Abdominal pain. History of colon cancer status post resection 10/20/2013  EXAM: CT ABDOMEN AND PELVIS WITH CONTRAST  TECHNIQUE: Multidetector CT imaging of the abdomen and pelvis was performed using the standard protocol following bolus administration of intravenous contrast.  CONTRAST:  84mL OMNIPAQUE IOHEXOL 300 MG/ML SOLN, 167mL OMNIPAQUE IOHEXOL 300 MG/ML SOLN  COMPARISON:  10/11/2013  FINDINGS: BODY WALL: Recent laparotomy changes  LOWER CHEST: There are small bilateral pleural effusions and basilar opacities with volume loss, most compatible with atelectasis. High-density material within the posterior costophrenic sulcus on the left, favored to be pre-existing mineralization, possibly more prominent in the setting of collapse. There were a few calcifications noted on the previous exam.  ABDOMEN/PELVIS:  Liver: 13 mm low-attenuation abnormality upper left lobe is stable from prior, likely cyst rather than metastatic  deposit.  Biliary: No evidence of biliary obstruction or stone.  Pancreas: Unremarkable.  Spleen: Unremarkable.  Adrenals: Unremarkable.  Kidneys and ureters: 1 cm low dense lesion the upper pole right kidney is presumably a cyst, although small size limits certainty.  Bladder: Unremarkable.  Reproductive: Unremarkable.  Bowel: Interval right hemicolectomy with ileocolonic anastomosis. At the anastomosis, there is a gas and fluid collection measuring 10 cm. Pneumoperitoneum is present, moderate volume. Small volume ascites, likely from peritonitis. Diffuse dilation of small and large bowel consistent with ileus.  Retroperitoneum: No mass or adenopathy.  Vascular: No acute abnormality.  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 Sclerosis in the anterior right femoral head neck junction is possibly a bone island, stable.  Critical Value/emergent results were called by telephone at the time of interpretation on 10/27/2013 at 9:45 PM to Dr. Pattricia Boss , who verbally acknowledged these results.  IMPRESSION: 1. Enterocolonic anastomotic breakdown with 10 cm abscess and pneumoperitoneum. 2. Reactive ileus. 3. Bibasilar atelectasis with small effusions.   Electronically Signed   By: Jorje Guild M.D.   On: 10/27/2013 21:47    Anti-infectives: Anti-infectives   Start     Dose/Rate Route Frequency Ordered Stop   10/28/13 0600  piperacillin-tazobactam (ZOSYN) IVPB 3.375 g     3.375 g 12.5 mL/hr over 240 Minutes Intravenous 3 times per day 10/27/13 2340     10/28/13 0300  fluconazole (DIFLUCAN) IVPB 200 mg     200 mg 100 mL/hr over 60 Minutes Intravenous Every 24 hours 10/28/13 0217  10/28/13 0000  fluconazole (DIFLUCAN) IVPB 200 mg  Status:  Discontinued     200 mg 100 mL/hr over 60 Minutes Intravenous Every 24 hours 10/27/13 2340 10/28/13 0217   10/27/13 2200  piperacillin-tazobactam (ZOSYN) IVPB 3.375 g     3.375 g 12.5 mL/hr over 240 Minutes Intravenous  Once 10/27/13 2147  10/28/13 3976      Assessment/Plan: Impression: Abdominal abscess secondary to anastomotic leak Plan: For interventional radiologic drainage of the abdominal abscess today. Continue current management.  LOS: 1 day    Hetty Linhart A 10/28/2013

## 2013-10-28 NOTE — Procedures (Signed)
Interventional Radiology Procedure Note  Procedure: Placement of 45F drain into right peri-anastomotic fluid and gas collection.  100 mL feculent fluid and gas aspirated.  Complications: None Recommendations: - Maintain to gravity bag drainage, avoid JP bulb - Discussed with Dr. Arnoldo Morale  Signed,  Criselda Peaches, MD [facility]

## 2013-10-28 NOTE — Progress Notes (Signed)
IR aware of request for drainage of RLQ abscess Chart and images reviewed by Dr. Laurence Ferrari, amenable to perc drain. Lovenox has been held, pt has been NPO  [facility] pt to [facility] today for procedure. IR team will see and consent pt upon arrival.  Eduardo [name] [facility] Interventional Radiology 10/28/2013 8:51 AM

## 2013-10-28 NOTE — H&P (Signed)
Eduardo [name] is an 68 y.o. male.    Chief Complaint: Hx colon ca Rt hemicolectomy 10/20/13 Presented with new abd pain; fever CT reveals RLQ abscess Scheduled now for drain placement  HPI: colon ca; DDD; GERD  Past Medical History  Diagnosis Date  . Arthritis   . DDD (degenerative disc disease), cervical   . Wears glasses   . GERD (gastroesophageal reflux disease)   . Headache(784.0)   . Neuropathy 2010  . Colon cancer 10/20/13    colon resection    Past Surgical History  Procedure Laterality Date  . Hemorrhoid surgery    . Carpal tunnel release  2011    left  . Knee arthroscopy  2001    rt and lt  . Upper gi endoscopy    . Colonoscopy    . Inguinal hernia repair      left x2  . Inguinal hernia repair      right  . Tonsillectomy    . Carpal tunnel release Right 01/07/2013    Procedure: CARPAL TUNNEL RELEASE;  Surgeon: Tami Ribas, MD;  Location: [facility];  Service: Orthopedics;  Laterality: Right;  . Colonoscopy N/A 10/18/2013    Procedure: COLONOSCOPY;  Surgeon: West Bali, MD;  [facility];  Service: Endoscopy;  Laterality: N/A;  2:00-moved to 1030 Pam to notify pt  . Colon resection N/A 10/20/2013    Procedure:  LAPAROSCOPIC hand assisted partial colectomy;  Surgeon: Dalia Heading, MD;  Location[facility];  Service: General;  Laterality: N/A;    Family History  Problem Relation Age of Onset  . Colon polyps Father   . Colon polyps Paternal Uncle   . Colon polyps Maternal Grandfather    Social History:  reports that he has never smoked. He does not have any smokeless tobacco history on file. He reports that he does not drink alcohol or use illicit drugs.  Allergies:  Allergies  Allergen Reactions  . Niaspan [Niacin Er] Rash    Medications Prior to Admission  Medication Sig Dispense Refill  . AMITIZA 24 MCG capsule Take 24 mcg by mouth 2 (two) times daily with a meal.       . benzonatate (TESSALON) 100 MG capsule Take 100  mg by mouth 2 (two) times daily as needed. For cough      . fluticasone (FLONASE) 50 MCG/ACT nasal spray Place 2 sprays into both nostrils daily.      Marland Kitchen HYDROcodone-acetaminophen (NORCO/VICODIN) 5-325 MG per tablet Take 1 tablet by mouth daily as needed. For pain      . omeprazole (PRILOSEC) 20 MG capsule Take 20 mg by mouth every morning.      Marland Kitchen oxyCODONE-acetaminophen (PERCOCET) 7.5-325 MG per tablet Take 1-2 tablets by mouth every 4 (four) hours as needed.  50 tablet  0  . pregabalin (LYRICA) 75 MG capsule Take 75 mg by mouth every evening.       . ranitidine (ZANTAC) 300 MG tablet Take 300 mg by mouth at bedtime.       . VENTOLIN HFA 108 (90 BASE) MCG/ACT inhaler Inhale 1-2 puffs into the lungs every 6 (six) hours as needed. For shortness of breath/wheezing      . ondansetron (ZOFRAN-ODT) 4 MG disintegrating tablet Take 4 mg by mouth 4 (four) times daily as needed. For nausea/vomiting      . rizatriptan (MAXALT) 10 MG tablet Take 10 mg by mouth as needed for migraine. May repeat in 2 hours  if needed        Results for orders placed during the hospital encounter of 10/27/13 (from the past 48 hour(s))  CBC WITH DIFFERENTIAL     Status: Abnormal   Collection Time    10/27/13  7:15 PM      Result Value Range   WBC 10.8 (*) 4.0 - 10.5 K/uL   RBC 3.95 (*) 4.22 - 5.81 MIL/uL   Hemoglobin 11.8 (*) 13.0 - 17.0 g/dL   HCT 33.9 (*) 39.0 - 52.0 %   MCV 85.8  78.0 - 100.0 fL   MCH 29.9  26.0 - 34.0 pg   MCHC 34.8  30.0 - 36.0 g/dL   RDW 12.9  11.5 - 15.5 %   Platelets 400  150 - 400 K/uL   Neutrophils Relative % 76  43 - 77 %   Neutro Abs 8.2 (*) 1.7 - 7.7 K/uL   Lymphocytes Relative 7 (*) 12 - 46 %   Lymphs Abs 0.6 (*) 0.7 - 4.0 K/uL   Monocytes Relative 17 (*) 3 - 12 %   Monocytes Absolute 1.9 (*) 0.1 - 1.0 K/uL   Eosinophils Relative 0  0 - 5 %   Eosinophils Absolute 0.0  0.0 - 0.7 K/uL   Basophils Relative 0  0 - 1 %   Basophils Absolute 0.0  0.0 - 0.1 K/uL  COMPREHENSIVE METABOLIC  PANEL     Status: Abnormal   Collection Time    10/27/13  7:15 PM      Result Value Range   Sodium 137  137 - 147 mEq/L   Potassium 3.1 (*) 3.7 - 5.3 mEq/L   Chloride 96  96 - 112 mEq/L   CO2 26  19 - 32 mEq/L   Glucose, Bld 110 (*) 70 - 99 mg/dL   BUN 26 (*) 6 - 23 mg/dL   Creatinine, Ser 0.64  0.50 - 1.35 mg/dL   Calcium 8.9  8.4 - 10.5 mg/dL   Total Protein 7.4  6.0 - 8.3 g/dL   Albumin 2.8 (*) 3.5 - 5.2 g/dL   AST 21  0 - 37 U/L   ALT 20  0 - 53 U/L   Alkaline Phosphatase 98  39 - 117 U/L   Total Bilirubin 0.6  0.3 - 1.2 mg/dL   GFR calc non Af Amer >90  >90 mL/min   GFR calc Af Amer >90  >90 mL/min   Comment: (NOTE)     The eGFR has been calculated using the CKD EPI equation.     This calculation has not been validated in all clinical situations.     eGFR's persistently <90 mL/min signify possible Chronic Kidney     Disease.  LIPASE, BLOOD     Status: None   Collection Time    10/27/13  7:15 PM      Result Value Range   Lipase 31  11 - 59 U/L  PROTIME-INR     Status: None   Collection Time    10/28/13 12:07 AM      Result Value Range   Prothrombin Time 15.0  11.6 - 15.2 seconds   INR 1.21  0.00 - 1.49   Ct Abdomen Pelvis W Contrast  10/27/2013   CLINICAL DATA:  Abdominal pain. History of colon cancer status post resection 10/20/2013  EXAM: CT ABDOMEN AND PELVIS WITH CONTRAST  TECHNIQUE: Multidetector CT imaging of the abdomen and pelvis was performed using the standard protocol following bolus administration of intravenous  contrast.  CONTRAST:  37m OMNIPAQUE IOHEXOL 300 MG/ML SOLN, 1047mOMNIPAQUE IOHEXOL 300 MG/ML SOLN  COMPARISON:  10/11/2013  FINDINGS: BODY WALL: Recent laparotomy changes  LOWER CHEST: There are small bilateral pleural effusions and basilar opacities with volume loss, most compatible with atelectasis. High-density material within the posterior costophrenic sulcus on the left, favored to be pre-existing mineralization, possibly more prominent in the  setting of collapse. There were a few calcifications noted on the previous exam.  ABDOMEN/PELVIS:  Liver: 13 mm low-attenuation abnormality upper left lobe is stable from prior, likely cyst rather than metastatic deposit.  Biliary: No evidence of biliary obstruction or stone.  Pancreas: Unremarkable.  Spleen: Unremarkable.  Adrenals: Unremarkable.  Kidneys and ureters: 1 cm low dense lesion the upper pole right kidney is presumably a cyst, although small size limits certainty.  Bladder: Unremarkable.  Reproductive: Unremarkable.  Bowel: Interval right hemicolectomy with ileocolonic anastomosis. At the anastomosis, there is a gas and fluid collection measuring 10 cm. Pneumoperitoneum is present, moderate volume. Small volume ascites, likely from peritonitis. Diffuse dilation of small and large bowel consistent with ileus.  Retroperitoneum: No mass or adenopathy.  Vascular: No acute abnormality.  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 Sclerosis in the anterior right femoral head neck junction is possibly a bone island, stable.  Critical Value/emergent results were called by telephone at the time of interpretation on 10/27/2013 at 9:45 PM to Dr. DAPattricia Boss who verbally acknowledged these results.  IMPRESSION: 1. Enterocolonic anastomotic breakdown with 10 cm abscess and pneumoperitoneum. 2. Reactive ileus. 3. Bibasilar atelectasis with small effusions.   Electronically Signed   By: JoJorje Guild.D.   On: 10/27/2013 21:47    Review of Systems  Constitutional: Positive for weight loss. Negative for fever.  Respiratory: Negative for shortness of breath.   Cardiovascular: Negative for chest pain.  Gastrointestinal: Positive for nausea and abdominal pain.  Neurological: Positive for weakness. Negative for dizziness.    Blood pressure 121/77, pulse 76, temperature 98.6 F (37 C), temperature source Oral, resp. rate 16, height 6' 2"  (1.88 m), weight 224 lb (101.606 kg), SpO2  97.00%. Physical Exam  Constitutional: He is oriented to person, place, and time. He appears well-developed.  Cardiovascular: Normal rate and regular rhythm.   No murmur heard. Respiratory: Effort normal and breath sounds normal. He has no wheezes.  GI: Soft. There is tenderness.  Musculoskeletal: Normal range of motion.  Neurological: He is alert and oriented to person, place, and time.  Skin: Skin is warm and dry.  Psychiatric: He has a normal mood and affect. His behavior is normal. Judgment and thought content normal.     Assessment/Plan Colon ca; hemicolectomy 10/20/13 New abscess per CT Now scheduled for drain placement Pt aware of procedure benefits and risks and agreeable to proceed Consent signed andin chart  Caelen Higinbotham A 10/28/2013, 12:04 PM

## 2013-10-29 LAB — BASIC METABOLIC PANEL
BUN: 27 mg/dL — ABNORMAL HIGH (ref 6–23)
CALCIUM: 8.6 mg/dL (ref 8.4–10.5)
CO2: 27 mEq/L (ref 19–32)
Chloride: 102 mEq/L (ref 96–112)
Creatinine, Ser: 0.82 mg/dL (ref 0.50–1.35)
GLUCOSE: 117 mg/dL — AB (ref 70–99)
Potassium: 3.2 mEq/L — ABNORMAL LOW (ref 3.7–5.3)
SODIUM: 141 meq/L (ref 137–147)

## 2013-10-29 LAB — CBC
HCT: 32.2 % — ABNORMAL LOW (ref 39.0–52.0)
Hemoglobin: 11 g/dL — ABNORMAL LOW (ref 13.0–17.0)
MCH: 29.6 pg (ref 26.0–34.0)
MCHC: 34.2 g/dL (ref 30.0–36.0)
MCV: 86.8 fL (ref 78.0–100.0)
PLATELETS: 410 10*3/uL — AB (ref 150–400)
RBC: 3.71 MIL/uL — AB (ref 4.22–5.81)
RDW: 13 % (ref 11.5–15.5)
WBC: 8.8 10*3/uL (ref 4.0–10.5)

## 2013-10-29 MED ORDER — POTASSIUM CHLORIDE 10 MEQ/100ML IV SOLN
10.0000 meq | INTRAVENOUS | Status: AC
  Administered 2013-10-29 (×3): 10 meq via INTRAVENOUS
  Filled 2013-10-29 (×3): qty 100

## 2013-10-29 MED ORDER — PANTOPRAZOLE SODIUM 40 MG IV SOLR
40.0000 mg | INTRAVENOUS | Status: DC
  Administered 2013-10-29 – 2013-10-30 (×2): 40 mg via INTRAVENOUS
  Filled 2013-10-29 (×2): qty 40

## 2013-10-29 NOTE — Care Management Note (Addendum)
    Page 1 of 2   11/03/2013     1:55:10 PM   CARE MANAGEMENT NOTE 11/03/2013  Patient:  ESIAS, MORY   Account Number:  [id number]  Date Initiated:  10/29/2013  Documentation initiated by:  Theophilus Kinds  Subjective/Objective Assessment:   Pt admitted from home with intraabd abcess post surgery. Pt lives with his wife and will return home at discharge. Pt in independent with ADL's.     Action/Plan:   Pts wife would like HH Rn at discharge if pt discharges home with drain in. [facility] chose [facility]. If pt d/c over weekend, weekend staff will call and fax [place] orders once written.   Anticipated DC Date:  11/01/2013   Anticipated DC Plan:  [place]  CM consult      [facility] Choice  HOME HEALTH   Choice offered to / List presented to:  C-3 Spouse        HH arranged  HH-1 RN      [place].   Status of service:  Completed, signed off Medicare Important Message given?   (If response is "NO", the following Medicare IM given date fields will be blank) Date Medicare IM given:   Date Additional Medicare IM given:    Discharge Disposition:  [place]  Per UR Regulation:    If discussed at Long Length of Stay Meetings, dates discussed:   11/02/2013    Comments:  11/03/13 [place], RN BSN CM Pt transferred to [facility] for 2nd opinion.  10/29/13 [place], RN BSN CM

## 2013-10-29 NOTE — Progress Notes (Signed)
Subjective: Depressed, but no fevers. No nausea or vomiting.  Objective: Vital signs in last 24 hours: Temp:  [97.8 F (36.6 C)-98.6 F (37 C)] 97.8 F (36.6 C) (01/09 0649) Pulse Rate:  [74-85] 74 (01/09 0649) Resp:  [16-21] 20 (01/09 0649) BP: (108-159)/(67-77) 123/72 mmHg (01/09 0649) SpO2:  [92 %-98 %] 94 % (01/09 0649) Last BM Date: 10/28/13  Intake/Output from previous day: 01/08 0701 - 01/09 0700 In: 0  Out: 800 [Drains:800] Intake/Output this shift: Total I/O In: 0  Out: 100 [Drains:100]  General appearance: alert, cooperative and no distress GI: Soft, incisions healing well. Nontender. No rigidity noted. Drainage is now clear dark yellow.  Lab Results:   Recent Labs  10/27/13 1915 10/29/13 0504  WBC 10.8* 8.8  HGB 11.8* 11.0*  HCT 33.9* 32.2*  PLT 400 410*   BMET  Recent Labs  10/27/13 1915 10/29/13 0504  NA 137 141  K 3.1* 3.2*  CL 96 102  CO2 26 27  GLUCOSE 110* 117*  BUN 26* 27*  CREATININE 0.64 0.82  CALCIUM 8.9 8.6   PT/INR  Recent Labs  10/28/13 0007  LABPROT 15.0  INR 1.21    Studies/Results: Ct Abdomen Pelvis W Contrast  10/27/2013   CLINICAL DATA:  Abdominal pain. History of colon cancer status post resection 10/20/2013  EXAM: CT ABDOMEN AND PELVIS WITH CONTRAST  TECHNIQUE: Multidetector CT imaging of the abdomen and pelvis was performed using the standard protocol following bolus administration of intravenous contrast.  CONTRAST:  31mL OMNIPAQUE IOHEXOL 300 MG/ML SOLN, 16mL OMNIPAQUE IOHEXOL 300 MG/ML SOLN  COMPARISON:  10/11/2013  FINDINGS: BODY WALL: Recent laparotomy changes  LOWER CHEST: There are small bilateral pleural effusions and basilar opacities with volume loss, most compatible with atelectasis. High-density material within the posterior costophrenic sulcus on the left, favored to be pre-existing mineralization, possibly more prominent in the setting of collapse. There were a few calcifications noted on the previous  exam.  ABDOMEN/PELVIS:  Liver: 13 mm low-attenuation abnormality upper left lobe is stable from prior, likely cyst rather than metastatic deposit.  Biliary: No evidence of biliary obstruction or stone.  Pancreas: Unremarkable.  Spleen: Unremarkable.  Adrenals: Unremarkable.  Kidneys and ureters: 1 cm low dense lesion the upper pole right kidney is presumably a cyst, although small size limits certainty.  Bladder: Unremarkable.  Reproductive: Unremarkable.  Bowel: Interval right hemicolectomy with ileocolonic anastomosis. At the anastomosis, there is a gas and fluid collection measuring 10 cm. Pneumoperitoneum is present, moderate volume. Small volume ascites, likely from peritonitis. Diffuse dilation of small and large bowel consistent with ileus.  Retroperitoneum: No mass or adenopathy.  Vascular: No acute abnormality.  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 Sclerosis in the anterior right femoral head neck junction is possibly a bone island, stable.  Critical Value/emergent results were called by telephone at the time of interpretation on 10/27/2013 at 9:45 PM to Dr. Pattricia Boss , who verbally acknowledged these results.  IMPRESSION: 1. Enterocolonic anastomotic breakdown with 10 cm abscess and pneumoperitoneum. 2. Reactive ileus. 3. Bibasilar atelectasis with small effusions.   Electronically Signed   By: Jorje Guild M.D.   On: 10/27/2013 21:47   Ct Image Guided Drainage By Percutaneous Catheter  10/28/2013   CLINICAL DATA:  68 year old male with a history of colon cancer status post right hemicolectomy with ileocolonic anastomosis. His postoperative course is complicated by a perianastamotic fluid and gas collection concerning for anastomotic leak/breakdown. The patient is [facility] for placement of a CT-guided abscess drain.  EXAM: CT GUIDED DRAINAGE OF intra-abdominal ABSCESS  ANESTHESIA/SEDATION: 3 Mg IV Versed 50 mcg IV Fentanyl  Total Moderate  Sedation Time:  18 minutes  PROCEDURE: The procedure, risks, benefits, and alternatives were explained to the patient. Questions regarding the procedure were encouraged and answered. The patient understands and consents to the procedure.  The right hemi abdomen was prepped with Betadinein a sterile fashion, and a sterile drape was applied covering the operative field. A sterile gown and sterile gloves were used for the procedure. Local anesthesia was provided with 1% Lidocaine.  A planning axial CT scan was performed. The fluid and gas collection adjacent to the ileocolonic anastomosis was successfully identified. A suitable skin entry site was selected and marked. Local anesthesia was attained by infiltration with 1% lidocaine. A small dermatotomy was made. Using CT fluoroscopic guidance, an 18 gauge trocar needle was advanced through the right abdominal wall and into the fluid and gas collection. Feculent material was returned through the needle. A wire was then advanced into the collection and the tract serially dilated to 12 [place]. A Cook 12 [place] all-purpose drain was then advanced over the wire and formed within the collection. Approximately 100 mL of frankly feculent material was aspirated. A post drain axial CT scan demonstrates good positioning of the drainage catheter. The catheter was connected to gravity bag drainage (in an effort to reduce the risk of fistula formation from JP bulb suction) and secured to the skin with 0 Prolene suture. A sterile bandage was placed. Overall, the patient tolerated the procedure well.  COMPLICATIONS: None  IMPRESSION: Successful placement of a 12 French percutaneous drain in the perianastamotic fluid and gas collection yielding approximately 100 mL of frankly feculent material.  Signed,  Criselda Peaches, MD  [facility]  [facility]   Electronically Signed   By: Jacqulynn Cadet M.D.   On: 10/28/2013 14:00     Anti-infectives: Anti-infectives   Start     Dose/Rate Route Frequency Ordered Stop   10/28/13 0600  piperacillin-tazobactam (ZOSYN) IVPB 3.375 g     3.375 g 12.5 mL/hr over 240 Minutes Intravenous 3 times per day 10/27/13 2340     10/28/13 0300  fluconazole (DIFLUCAN) IVPB 200 mg     200 mg 100 mL/hr over 60 Minutes Intravenous Every 24 hours 10/28/13 0217     10/28/13 0000  fluconazole (DIFLUCAN) IVPB 200 mg  Status:  Discontinued     200 mg 100 mL/hr over 60 Minutes Intravenous Every 24 hours 10/27/13 2340 10/28/13 0217   10/27/13 2200  piperacillin-tazobactam (ZOSYN) IVPB 3.375 g     3.375 g 12.5 mL/hr over 240 Minutes Intravenous  Once 10/27/13 2147 10/28/13 0307      Assessment/Plan: Impression: Status post drainage of intra-abdominal abscess. The drainage has cleared of any feculent material. He has no leukocytosis. We'll continue with bowel rest, though allowing patient to clear liquid diet. Will continue to monitor.  LOS: 2 days    Hanan Mcwilliams A 10/29/2013

## 2013-10-30 LAB — CBC
HCT: 32.5 % — ABNORMAL LOW (ref 39.0–52.0)
Hemoglobin: 11.1 g/dL — ABNORMAL LOW (ref 13.0–17.0)
MCH: 29.9 pg (ref 26.0–34.0)
MCHC: 34.2 g/dL (ref 30.0–36.0)
MCV: 87.6 fL (ref 78.0–100.0)
PLATELETS: 455 10*3/uL — AB (ref 150–400)
RBC: 3.71 MIL/uL — ABNORMAL LOW (ref 4.22–5.81)
RDW: 13.3 % (ref 11.5–15.5)
WBC: 10 10*3/uL (ref 4.0–10.5)

## 2013-10-30 NOTE — Progress Notes (Signed)
Subjective: Feeling a little bit better. Not as fatigued.  Objective: Vital signs in last 24 hours: Temp:  [98.1 F (36.7 C)-98.6 F (37 C)] 98.6 F (37 C) (01/10 0631) Pulse Rate:  [69-72] 72 (01/10 0631) Resp:  [20] 20 (01/10 0631) BP: (102-116)/(65-78) 110/78 mmHg (01/10 0631) SpO2:  [92 %-96 %] 96 % (01/10 0631) Last BM Date: 10/28/13  Intake/Output from previous day: 01/09 0701 - 01/10 0700 In: 6572.5 [P.O.:360; I.V.:5602.5; IV Piggyback:600] Out: 1375 [Urine:700; Drains:675] Intake/Output this shift: Total I/O In: -  Out: 950 [Urine:700; Drains:250]  General appearance: alert, cooperative and no distress Resp: clear to auscultation bilaterally Cardio: regular rate and rhythm, S1, S2 normal, no murmur, click, rub or gallop GI: Soft. Incisions healing well. Drain still with some feculent fluid present today, but mostly clear.  Lab Results:   Recent Labs  10/29/13 0504 10/30/13 0629  WBC 8.8 10.0  HGB 11.0* 11.1*  HCT 32.2* 32.5*  PLT 410* 455*   BMET  Recent Labs  10/27/13 1915 10/29/13 0504  NA 137 141  K 3.1* 3.2*  CL 96 102  CO2 26 27  GLUCOSE 110* 117*  BUN 26* 27*  CREATININE 0.64 0.82  CALCIUM 8.9 8.6   PT/INR  Recent Labs  10/28/13 0007  LABPROT 15.0  INR 1.21    Studies/Results: Ct Image Guided Drainage By Percutaneous Catheter  10/28/2013   CLINICAL DATA:  68 year old male with a history of colon cancer status post right hemicolectomy with ileocolonic anastomosis. His postoperative course is complicated by a perianastamotic fluid and gas collection concerning for anastomotic leak/breakdown. The patient is [facility] hospital for placement of a CT-guided abscess drain.  EXAM: CT GUIDED DRAINAGE OF intra-abdominal ABSCESS  ANESTHESIA/SEDATION: 3 Mg IV Versed 50 mcg IV Fentanyl  Total Moderate Sedation Time:  18 minutes  PROCEDURE: The procedure, risks, benefits, and alternatives were explained to the patient. Questions  regarding the procedure were encouraged and answered. The patient understands and consents to the procedure.  The right hemi abdomen was prepped with Betadinein a sterile fashion, and a sterile drape was applied covering the operative field. A sterile gown and sterile gloves were used for the procedure. Local anesthesia was provided with 1% Lidocaine.  A planning axial CT scan was performed. The fluid and gas collection adjacent to the ileocolonic anastomosis was successfully identified. A suitable skin entry site was selected and marked. Local anesthesia was attained by infiltration with 1% lidocaine. A small dermatotomy was made. Using CT fluoroscopic guidance, an 18 gauge trocar needle was advanced through the right abdominal wall and into the fluid and gas collection. Feculent material was returned through the needle. A wire was then advanced into the collection and the tract serially dilated to 12 [place]. A Cook 12 [place] all-purpose drain was then advanced over the wire and formed within the collection. Approximately 100 mL of frankly feculent material was aspirated. A post drain axial CT scan demonstrates good positioning of the drainage catheter. The catheter was connected to gravity bag drainage (in an effort to reduce the risk of fistula formation from JP bulb suction) and secured to the skin with 0 Prolene suture. A sterile bandage was placed. Overall, the patient tolerated the procedure well.  COMPLICATIONS: None  IMPRESSION: Successful placement of a 12 French percutaneous drain in the perianastamotic fluid and gas collection yielding approximately 100 mL of frankly feculent material.  Signed,  Criselda Peaches, MD  [facility]  [facility]   Electronically Signed   By: Jacqulynn Cadet M.D.   On: 10/28/2013 14:00    Anti-infectives: Anti-infectives   Start     Dose/Rate Route Frequency Ordered Stop   10/28/13 0600  piperacillin-tazobactam (ZOSYN)  IVPB 3.375 g     3.375 g 12.5 mL/hr over 240 Minutes Intravenous 3 times per day 10/27/13 2340     10/28/13 0300  fluconazole (DIFLUCAN) IVPB 200 mg     200 mg 100 mL/hr over 60 Minutes Intravenous Every 24 hours 10/28/13 0217     10/28/13 0000  fluconazole (DIFLUCAN) IVPB 200 mg  Status:  Discontinued     200 mg 100 mL/hr over 60 Minutes Intravenous Every 24 hours 10/27/13 2340 10/28/13 0217   10/27/13 2200  piperacillin-tazobactam (ZOSYN) IVPB 3.375 g     3.375 g 12.5 mL/hr over 240 Minutes Intravenous  Once 10/27/13 2147 10/28/13 2023      Assessment/Plan: Impression: Abdominal abscess, status post right hemicolectomy. Status post CT-guided drainage of abscess. Plan: Will repeat CT scan in a.m. to assess for anastomotic leak. Further management pending those results. Continue IV antibiotics.  LOS: 3 days    Keilynn Marano A 10/30/2013

## 2013-10-31 ENCOUNTER — Inpatient Hospital Stay (HOSPITAL_COMMUNITY): Payer: Managed Care, Other (non HMO)

## 2013-10-31 LAB — BASIC METABOLIC PANEL
BUN: 24 mg/dL — ABNORMAL HIGH (ref 6–23)
CHLORIDE: 100 meq/L (ref 96–112)
CO2: 25 mEq/L (ref 19–32)
Calcium: 8.8 mg/dL (ref 8.4–10.5)
Creatinine, Ser: 0.84 mg/dL (ref 0.50–1.35)
GFR calc Af Amer: >90 mL/min (ref >90)
GFR calc non Af Amer: 90 mL/min — ABNORMAL LOW (ref >90)
GLUCOSE: 90 mg/dL (ref 70–99)
POTASSIUM: 4.1 meq/L (ref 3.7–5.3)
SODIUM: 138 meq/L (ref 137–147)

## 2013-10-31 LAB — CBC
HCT: 34.6 % — ABNORMAL LOW (ref 39.0–52.0)
Hemoglobin: 11.7 g/dL — ABNORMAL LOW (ref 13.0–17.0)
MCH: 29.8 pg (ref 26.0–34.0)
MCHC: 33.8 g/dL (ref 30.0–36.0)
MCV: 88 fL (ref 78.0–100.0)
Platelets: 477 10*3/uL — ABNORMAL HIGH (ref 150–400)
RBC: 3.93 MIL/uL — ABNORMAL LOW (ref 4.22–5.81)
RDW: 13.2 % (ref 11.5–15.5)
WBC: 9.3 10*3/uL (ref 4.0–10.5)

## 2013-10-31 MED ORDER — IOHEXOL 300 MG/ML  SOLN
50.0000 mL | Freq: Once | INTRAMUSCULAR | Status: AC | PRN
  Administered 2013-10-31: 50 mL via ORAL

## 2013-10-31 MED ORDER — PANTOPRAZOLE SODIUM 40 MG PO TBEC
40.0000 mg | DELAYED_RELEASE_TABLET | Freq: Every day | ORAL | Status: DC
  Administered 2013-11-01 – 2013-11-06 (×6): 40 mg via ORAL
  Filled 2013-10-31 (×6): qty 1

## 2013-10-31 MED ORDER — ENSURE COMPLETE PO LIQD
237.0000 mL | Freq: Two times a day (BID) | ORAL | Status: DC
  Administered 2013-10-31 – 2013-11-03 (×5): 237 mL via ORAL

## 2013-10-31 MED ORDER — IOHEXOL 300 MG/ML  SOLN
100.0000 mL | Freq: Once | INTRAMUSCULAR | Status: AC | PRN
  Administered 2013-10-31: 100 mL via INTRAVENOUS

## 2013-10-31 MED ORDER — ENSURE PUDDING PO PUDG
1.0000 | Freq: Three times a day (TID) | ORAL | Status: DC
  Administered 2013-10-31 – 2013-11-01 (×5): 1 via ORAL

## 2013-10-31 NOTE — Progress Notes (Signed)
Subjective: Feeling much better.  Did have a bowel movement yesterday.  Objective: Vital signs in last 24 hours: Temp:  [98.1 F (36.7 C)-98.2 F (36.8 C)] 98.2 F (36.8 C) (01/11 0521) Pulse Rate:  [62-70] 62 (01/11 0521) Resp:  [20] 20 (01/11 0521) BP: (106-110)/(60-69) 107/60 mmHg (01/11 0521) SpO2:  [94 %-99 %] 99 % (01/11 0521) Last BM Date: 10/31/13  Intake/Output from previous day: 01/10 0701 - 01/11 0700 In: 1523.8 [I.V.:1368.8; IV Piggyback:150] Out: 2701 [Urine:1700; Drains:1000; Stool:1] Intake/Output this shift: Total I/O In: 5 [Other:5] Out: -   General appearance: alert, cooperative and no distress GI: Soft, bs appreciated.  No specific tenderness noted.  Clear yellow fluid in drain.  No rigidity noted.  Lab Results:   Recent Labs  10/30/13 0629 10/31/13 0604  WBC 10.0 9.3  HGB 11.1* 11.7*  HCT 32.5* 34.6*  PLT 455* 477*   BMET  Recent Labs  10/29/13 0504 10/31/13 0604  NA 141 138  K 3.2* 4.1  CL 102 100  CO2 27 25  GLUCOSE 117* 90  BUN 27* 24*  CREATININE 0.82 0.84  CALCIUM 8.6 8.8   PT/INR No results found for this basename: LABPROT, INR,  in the last 72 hours  Studies/Results: Ct Abdomen Pelvis W Contrast  10/31/2013   CLINICAL DATA:  Colon cancer status post right hemicolectomy with ileocolonic anastomosis. Abdominal fluid collection or worrisome for anastomotic leak/breakdown, status post percutaneous drainage.  EXAM: CT ABDOMEN AND PELVIS WITH CONTRAST  TECHNIQUE: Multidetector CT imaging of the abdomen and pelvis was performed using the standard protocol following bolus administration of intravenous contrast.  CONTRAST:  67mL OMNIPAQUE IOHEXOL 300 MG/ML SOLN, 164mL OMNIPAQUE IOHEXOL 300 MG/ML SOLN  COMPARISON:  10/27/2013  FINDINGS: Trace bilateral pleural effusions with associated lower lobe opacities, likely atelectasis.  10 mm probable cyst in the lateral segment left hepatic lobe (series 2/ image 17).  Spleen, pancreas, and  adrenal glands are within normal limits.  Gallbladder is unremarkable. No intrahepatic or extrahepatic ductal dilatation.  Kidneys are within normal limits.  No hydronephrosis.  Status post right hemicolectomy with ileocolonic anastomosis. Mildly prominent loops of small bowel in the left upper abdomen, without focal transition, grossly unchanged and possibly reflecting adynamic ileus. Fluid-filled loops of distal colon.  4.2 x 6.7 x 6.5 cm fluid and gas collection in the right mid abdomen, near the suture line, previously 8.7 x 9.4 x 10.5 cm. Indwelling pigtail drainage catheter along the superior margin of the collection (series 2/image 49). Small volume pneumoperitoneum, most prominently along the anterior liver dome (series 2/image 19), stable versus mildly decreased.  Atherosclerotic calcifications of the abdominal aorta and branch vessels.  No abdominopelvic ascites.  No suspicious abdominopelvic lymphadenopathy.  Prostate is unremarkable.  Bladder is notable for a tiny focus of nondependent gas, likely related to prior instrumentation, and is otherwise within normal limits.  Postsurgical changes in the anterior abdominal wall.  Mild degenerative changes of the lower thoracic spine and L5-S1. Sequela of prior AVN involving the bilateral femoral heads.  IMPRESSION: Status post right hemicolectomy with ileocolonic anastomosis.  6.7 cm fluid and gas collection in the right mid abdomen near the suture line, with interval placement of an indwelling pigtail drainage catheter, decreased.  Small volume pneumoperitoneum, stable versus mildly decreased.  Suspected small bowel ileus, grossly unchanged.   Electronically Signed   By: Julian Hy M.D.   On: 10/31/2013 11:57    Anti-infectives: Anti-infectives   Start     Dose/Rate Route  Frequency Ordered Stop   10/28/13 0600  piperacillin-tazobactam (ZOSYN) IVPB 3.375 g     3.375 g 12.5 mL/hr over 240 Minutes Intravenous 3 times per day 10/27/13 2340      10/28/13 0300  fluconazole (DIFLUCAN) IVPB 200 mg     200 mg 100 mL/hr over 60 Minutes Intravenous Every 24 hours 10/28/13 0217     10/28/13 0000  fluconazole (DIFLUCAN) IVPB 200 mg  Status:  Discontinued     200 mg 100 mL/hr over 60 Minutes Intravenous Every 24 hours 10/27/13 2340 10/28/13 0217   10/27/13 2200  piperacillin-tazobactam (ZOSYN) IVPB 3.375 g     3.375 g 12.5 mL/hr over 240 Minutes Intravenous  Once 10/27/13 2147 10/28/13 0307      Assessment/Plan: Imp:  Abdominal abscess, s/p right hemicolectomy.  Drainage has cleared.  CT scan shows no specific anastomotic leak, abscess cavity decreased in size. Plan:  No need for acute surgical intervention at this time.  Will advance diet to full liquids, patient wants ensure supplements.  Will continue to monitor drainage.  Discussed plan with patient and wife.  All questions answered.  LOS: 4 days    Eduardo [name] A 10/31/2013

## 2013-11-01 NOTE — Progress Notes (Signed)
Subjective: States his energy level is slightly improved. He is tolerating full liquid diet well. He is having flatus. No significant abdominal pain noted.  Objective: Vital signs in last 24 hours: Temp:  [98.2 F (36.8 C)-98.6 F (37 C)] 98.6 F (37 C) (01/12 0506) Pulse Rate:  [77] 77 (01/11 1515) Resp:  [20] 20 (01/12 0506) BP: (111-113)/(66-68) 113/68 mmHg (01/12 0506) SpO2:  [95 %-98 %] 98 % (01/12 0506) Last BM Date: 10/31/13  Intake/Output from previous day: 01/11 0701 - 01/12 0700 In: 10  Out: 651 [Urine:450; Drains:200; Stool:1] Intake/Output this shift: Total I/O In: 480 [P.O.:480] Out: -   General appearance: alert, cooperative and no distress Resp: clear to auscultation bilaterally Cardio: regular rate and rhythm, S1, S2 normal, no murmur, click, rub or gallop GI: Soft. Drainage still cloudy yellow to brown in nature. Drainage has decreased over the last 24 hours.  Lab Results:   Recent Labs  10/30/13 0629 10/31/13 0604  WBC 10.0 9.3  HGB 11.1* 11.7*  HCT 32.5* 34.6*  PLT 455* 477*   BMET  Recent Labs  10/31/13 0604  NA 138  K 4.1  CL 100  CO2 25  GLUCOSE 90  BUN 24*  CREATININE 0.84  CALCIUM 8.8   PT/INR No results found for this basename: LABPROT, INR,  in the last 72 hours  Studies/Results: Ct Abdomen Pelvis W Contrast  10/31/2013   CLINICAL DATA:  Colon cancer status post right hemicolectomy with ileocolonic anastomosis. Abdominal fluid collection or worrisome for anastomotic leak/breakdown, status post percutaneous drainage.  EXAM: CT ABDOMEN AND PELVIS WITH CONTRAST  TECHNIQUE: Multidetector CT imaging of the abdomen and pelvis was performed using the standard protocol following bolus administration of intravenous contrast.  CONTRAST:  109mL OMNIPAQUE IOHEXOL 300 MG/ML SOLN, 127mL OMNIPAQUE IOHEXOL 300 MG/ML SOLN  COMPARISON:  10/27/2013  FINDINGS: Trace bilateral pleural effusions with associated lower lobe opacities, likely  atelectasis.  10 mm probable cyst in the lateral segment left hepatic lobe (series 2/ image 17).  Spleen, pancreas, and adrenal glands are within normal limits.  Gallbladder is unremarkable. No intrahepatic or extrahepatic ductal dilatation.  Kidneys are within normal limits.  No hydronephrosis.  Status post right hemicolectomy with ileocolonic anastomosis. Mildly prominent loops of small bowel in the left upper abdomen, without focal transition, grossly unchanged and possibly reflecting adynamic ileus. Fluid-filled loops of distal colon.  4.2 x 6.7 x 6.5 cm fluid and gas collection in the right mid abdomen, near the suture line, previously 8.7 x 9.4 x 10.5 cm. Indwelling pigtail drainage catheter along the superior margin of the collection (series 2/image 49). Small volume pneumoperitoneum, most prominently along the anterior liver dome (series 2/image 19), stable versus mildly decreased.  Atherosclerotic calcifications of the abdominal aorta and branch vessels.  No abdominopelvic ascites.  No suspicious abdominopelvic lymphadenopathy.  Prostate is unremarkable.  Bladder is notable for a tiny focus of nondependent gas, likely related to prior instrumentation, and is otherwise within normal limits.  Postsurgical changes in the anterior abdominal wall.  Mild degenerative changes of the lower thoracic spine and L5-S1. Sequela of prior AVN involving the bilateral femoral heads.  IMPRESSION: Status post right hemicolectomy with ileocolonic anastomosis.  6.7 cm fluid and gas collection in the right mid abdomen near the suture line, with interval placement of an indwelling pigtail drainage catheter, decreased.  Small volume pneumoperitoneum, stable versus mildly decreased.  Suspected small bowel ileus, grossly unchanged.   Electronically Signed   By: Henderson Newcomer.D.  On: 10/31/2013 11:57    Anti-infectives: Anti-infectives   Start     Dose/Rate Route Frequency Ordered Stop   10/28/13 0600   piperacillin-tazobactam (ZOSYN) IVPB 3.375 g     3.375 g 12.5 mL/hr over 240 Minutes Intravenous 3 times per day 10/27/13 2340     10/28/13 0300  fluconazole (DIFLUCAN) IVPB 200 mg     200 mg 100 mL/hr over 60 Minutes Intravenous Every 24 hours 10/28/13 0217     10/28/13 0000  fluconazole (DIFLUCAN) IVPB 200 mg  Status:  Discontinued     200 mg 100 mL/hr over 60 Minutes Intravenous Every 24 hours 10/27/13 2340 10/28/13 0217   10/27/13 2200  piperacillin-tazobactam (ZOSYN) IVPB 3.375 g     3.375 g 12.5 mL/hr over 240 Minutes Intravenous  Once 10/27/13 2147 10/28/13 5643      Assessment/Plan: Impression: Status post image guided drainage of abdominal abscess, status post right hemicolectomy. Drainage has decreased since I last saw him. We'll continue to monitor drainage. Will have final decision over the next 24-48 hours as to whether the patient needs to go back to the operating room for revision of the anastomosis.  LOS: 5 days    Areana Kosanke A 11/01/2013

## 2013-11-02 LAB — SURGICAL PCR SCREEN
MRSA, PCR: NEGATIVE
Staphylococcus aureus: NEGATIVE

## 2013-11-02 MED ORDER — CHLORHEXIDINE GLUCONATE 4 % EX LIQD
1.0000 "application " | Freq: Once | CUTANEOUS | Status: AC
  Administered 2013-11-02: 1 via TOPICAL
  Filled 2013-11-02: qty 15

## 2013-11-02 NOTE — Progress Notes (Signed)
Pt ambulated from his room to the opposite end of the unit. Pt's gate was steady and pt tolerated walk well. Will continue to monitor pt.

## 2013-11-02 NOTE — Progress Notes (Signed)
Subjective: Spirits much better. Denies abdominal pain.  Objective: Vital signs in last 24 hours: Temp:  [97.9 F (36.6 C)-98.1 F (36.7 C)] 97.9 F (36.6 C) (01/13 0540) Pulse Rate:  [75-76] 75 (01/13 0540) Resp:  [20] 20 (01/13 0540) BP: (101-109)/(59-73) 101/59 mmHg (01/13 0540) SpO2:  [93 %-95 %] 93 % (01/13 0540) Last BM Date: 11/01/13  Intake/Output from previous day: 01/12 0701 - 01/13 0700 In: 600 [P.O.:600] Out: 140 [Drains:140] Intake/Output this shift:    General appearance: alert, cooperative and no distress Resp: clear to auscultation bilaterally Cardio: regular rate and rhythm, S1, S2 normal, no murmur, click, rub or gallop GI: Soft. Still with some air and feculent material within the drain. No rigidity noted.  Lab Results:   Recent Labs  10/31/13 0604  WBC 9.3  HGB 11.7*  HCT 34.6*  PLT 477*   BMET  Recent Labs  10/31/13 0604  NA 138  K 4.1  CL 100  CO2 25  GLUCOSE 90  BUN 24*  CREATININE 0.84  CALCIUM 8.8   PT/INR No results found for this basename: LABPROT, INR,  in the last 72 hours  Studies/Results: Ct Abdomen Pelvis W Contrast  10/31/2013   CLINICAL DATA:  Colon cancer status post right hemicolectomy with ileocolonic anastomosis. Abdominal fluid collection or worrisome for anastomotic leak/breakdown, status post percutaneous drainage.  EXAM: CT ABDOMEN AND PELVIS WITH CONTRAST  TECHNIQUE: Multidetector CT imaging of the abdomen and pelvis was performed using the standard protocol following bolus administration of intravenous contrast.  CONTRAST:  67mL OMNIPAQUE IOHEXOL 300 MG/ML SOLN, 145mL OMNIPAQUE IOHEXOL 300 MG/ML SOLN  COMPARISON:  10/27/2013  FINDINGS: Trace bilateral pleural effusions with associated lower lobe opacities, likely atelectasis.  10 mm probable cyst in the lateral segment left hepatic lobe (series 2/ image 17).  Spleen, pancreas, and adrenal glands are within normal limits.  Gallbladder is unremarkable. No  intrahepatic or extrahepatic ductal dilatation.  Kidneys are within normal limits.  No hydronephrosis.  Status post right hemicolectomy with ileocolonic anastomosis. Mildly prominent loops of small bowel in the left upper abdomen, without focal transition, grossly unchanged and possibly reflecting adynamic ileus. Fluid-filled loops of distal colon.  4.2 x 6.7 x 6.5 cm fluid and gas collection in the right mid abdomen, near the suture line, previously 8.7 x 9.4 x 10.5 cm. Indwelling pigtail drainage catheter along the superior margin of the collection (series 2/image 49). Small volume pneumoperitoneum, most prominently along the anterior liver dome (series 2/image 19), stable versus mildly decreased.  Atherosclerotic calcifications of the abdominal aorta and branch vessels.  No abdominopelvic ascites.  No suspicious abdominopelvic lymphadenopathy.  Prostate is unremarkable.  Bladder is notable for a tiny focus of nondependent gas, likely related to prior instrumentation, and is otherwise within normal limits.  Postsurgical changes in the anterior abdominal wall.  Mild degenerative changes of the lower thoracic spine and L5-S1. Sequela of prior AVN involving the bilateral femoral heads.  IMPRESSION: Status post right hemicolectomy with ileocolonic anastomosis.  6.7 cm fluid and gas collection in the right mid abdomen near the suture line, with interval placement of an indwelling pigtail drainage catheter, decreased.  Small volume pneumoperitoneum, stable versus mildly decreased.  Suspected small bowel ileus, grossly unchanged.   Electronically Signed   By: Julian Hy M.D.   On: 10/31/2013 11:57    Anti-infectives: Anti-infectives   Start     Dose/Rate Route Frequency Ordered Stop   10/28/13 0600  piperacillin-tazobactam (ZOSYN) IVPB 3.375 g  3.375 g 12.5 mL/hr over 240 Minutes Intravenous 3 times per day 10/27/13 2340     10/28/13 0300  fluconazole (DIFLUCAN) IVPB 200 mg     200 mg 100 mL/hr over  60 Minutes Intravenous Every 24 hours 10/28/13 0217     10/28/13 0000  fluconazole (DIFLUCAN) IVPB 200 mg  Status:  Discontinued     200 mg 100 mL/hr over 60 Minutes Intravenous Every 24 hours 10/27/13 2340 10/28/13 0217   10/27/13 2200  piperacillin-tazobactam (ZOSYN) IVPB 3.375 g     3.375 g 12.5 mL/hr over 240 Minutes Intravenous  Once 10/27/13 2147 10/28/13 7972      Assessment/Plan: Impression: Abdominal abscess, status post image guided drainage of abdominal abscess, status post right hemicolectomy Plan: Even though the drainage has subsided and CT scan of the abdomen did not show an apparently, I am concerned enough given away the drainage now looks that I feel we need to do an exploratory laparotomy with possible revision of his ileocolic anastomosis. The risks and benefits of the procedure including bleeding, infection, and a possibly needing further bowel resection were fully explained to the patient, who gave informed consent.  LOS: 6 days    Kuba Shepherd A 11/02/2013

## 2013-11-03 ENCOUNTER — Encounter (HOSPITAL_COMMUNITY): Payer: Self-pay | Admitting: General Surgery

## 2013-11-03 ENCOUNTER — Encounter (HOSPITAL_COMMUNITY): Admission: EM | Disposition: A | Discharge status: Home Health | Payer: Self-pay | Source: Home / Self Care

## 2013-11-03 ENCOUNTER — Encounter (HOSPITAL_COMMUNITY): Payer: Self-pay | Admitting: Anesthesiology

## 2013-11-03 DIAGNOSIS — E46 Unspecified protein-calorie malnutrition: Secondary | ICD-10-CM

## 2013-11-03 DIAGNOSIS — K632 Fistula of intestine: Secondary | ICD-10-CM

## 2013-11-03 DIAGNOSIS — K651 Peritoneal abscess: Secondary | ICD-10-CM

## 2013-11-03 LAB — BASIC METABOLIC PANEL
BUN: 11 mg/dL (ref 6–23)
CO2: 24 mEq/L (ref 19–32)
CREATININE: 0.96 mg/dL (ref 0.50–1.35)
Calcium: 9 mg/dL (ref 8.4–10.5)
Chloride: 95 mEq/L — ABNORMAL LOW (ref 96–112)
GFR calc non Af Amer: 85 mL/min — ABNORMAL LOW (ref >90)
Glucose, Bld: 96 mg/dL (ref 70–99)
Potassium: 4.8 mEq/L (ref 3.7–5.3)
Sodium: 130 mEq/L — ABNORMAL LOW (ref 137–147)

## 2013-11-03 LAB — CBC
HCT: 35.1 % — ABNORMAL LOW (ref 39.0–52.0)
HEMOGLOBIN: 11.9 g/dL — AB (ref 13.0–17.0)
MCH: 29.6 pg (ref 26.0–34.0)
MCHC: 33.9 g/dL (ref 30.0–36.0)
MCV: 87.3 fL (ref 78.0–100.0)
Platelets: 516 10*3/uL — ABNORMAL HIGH (ref 150–400)
RBC: 4.02 MIL/uL — ABNORMAL LOW (ref 4.22–5.81)
RDW: 13.3 % (ref 11.5–15.5)
WBC: 11.9 10*3/uL — ABNORMAL HIGH (ref 4.0–10.5)

## 2013-11-03 SURGERY — LAPAROTOMY, EXPLORATORY
Anesthesia: General

## 2013-11-03 MED ORDER — ENOXAPARIN SODIUM 40 MG/0.4ML ~~LOC~~ SOLN
40.0000 mg | SUBCUTANEOUS | Status: DC
  Administered 2013-11-03 – 2013-11-10 (×8): 40 mg via SUBCUTANEOUS
  Filled 2013-11-03 (×9): qty 0.4

## 2013-11-03 MED ORDER — SODIUM CHLORIDE 0.9 % IJ SOLN
10.0000 mL | INTRAMUSCULAR | Status: DC | PRN
  Administered 2013-11-04: 10 mL
  Administered 2013-11-08: 20 mL

## 2013-11-03 MED ORDER — PRO-STAT SUGAR FREE PO LIQD
30.0000 mL | Freq: Three times a day (TID) | ORAL | Status: DC
  Filled 2013-11-03 (×3): qty 30

## 2013-11-03 MED ORDER — ONDANSETRON HCL 4 MG/2ML IJ SOLN: 4.0000 mg | Freq: Four times a day (QID) | INTRAMUSCULAR | Status: DC | PRN

## 2013-11-03 MED ORDER — ADULT MULTIVITAMIN W/MINERALS CH
1.0000 | ORAL_TABLET | Freq: Every day | ORAL | Status: DC
  Administered 2013-11-03: 1 via ORAL
  Filled 2013-11-03 (×2): qty 1

## 2013-11-03 MED ORDER — SODIUM CHLORIDE 0.9 % IV SOLN
1.0000 g | INTRAVENOUS | Status: DC
  Administered 2013-11-03 – 2013-11-10 (×8): 1 g via INTRAVENOUS
  Filled 2013-11-03 (×10): qty 1

## 2013-11-03 NOTE — H&P (Signed)
Eduardo [name] [birth year]  [id number].    Requesting MD: Dr. Arnoldo Morale Chief Complaint/Reason for Consult: Abdominal pain, controlled EC fistula  HPI:  68 year old white male who underwent a laparoscopic hand-assisted right hemicolectomy on 10/20/2013 for colon cancer at the hepatic flexure which was found to be invasive adenocarcinoma with lymph node involvement.  He did fairly well after surgery and was having normal bowel function and eating until he presented to [facility] on 10/27/2013 with generalized malaise. CT scan of the abdomen revealed an intra-abdominal abscess with extraluminal air within the peritoneal cavity.  WBC normal.  He was cared for by Dr. Arnoldo Morale.  He underwent image guided percutaneous drainage of the abscess on 10/28/2013 at [facility]. He tolerated the procedure well. Feculent material was seen. Reportedly his drain cleared up and amount improved.  He had a followup CT scan of the abdomen done on 10/31/2013 which revealed a resolving intra-abdominal abscess with appropriate catheter placement, and no anastomotic leak. The patients diet was advanced as tolerated to fulls, but his drainage output increased and became more feculent and foul smelling with occasional air in the bag.  Dr. Arnoldo Morale offered surgery, but the patient and wife were hesitant so they requested transfer to [facility] for further surgical management.  We agreed to take in transfer from [facility] on to the [facility] service.    He complains of mostly pain at the drain site which can be server and sharp at times.  No radiating pain.  His appetite has been very low and only able to tolerate full liquids at most.  No vomiting.  Having some BM's and flatus, but has lessened recently.    Pathology report: Colon, segmental resection for tumor, ascending - INVASIVE ADENOCARCINOMA, MODERATELY DIFFERENTIATED, SPANNING 2.5 CM. - ADENOCARCINOMA INVOLVES PERICOLONIC SOFT TISSUE. - LYMPHOVASCULAR INVASION IS IDENTIFIED. - METASTATIC ADENOCARCINOMA IN  1 OF 19 LYMPH NODES (1/19). - THE SURGICAL RESECTION MARGINS ARE NEGATIVE FOR CARCINOMA. - BENIGN APPENDIX. - SEE ONCOLOGY TABLE BELOW.  ROS: All systems reviewed and otherwise negative except for as above  Family History  Problem Relation Age of Onset  . Colon polyps Father   . Colon polyps Paternal Uncle   . Colon polyps Maternal Grandfather     Past Medical History  Diagnosis Date  . Arthritis   . DDD (degenerative disc disease), cervical   . Wears glasses   . GERD (gastroesophageal reflux disease)   . Headache(784.0)   . Neuropathy 2010  . Colon cancer 10/20/13    colon resection    Past Surgical History  Procedure Laterality Date  . Hemorrhoid surgery    . Carpal tunnel release  2011    left  . Knee arthroscopy  2001    rt and lt  . Upper gi endoscopy    . Colonoscopy    . Inguinal hernia repair      left x2  . Inguinal hernia repair      right  . Tonsillectomy    . Carpal tunnel release Right 01/07/2013    Procedure: CARPAL TUNNEL RELEASE;  Surgeon: Tennis Must, MD;  Location: [place];  Service: Orthopedics;  Laterality: Right;  . Colonoscopy N/A 10/18/2013    Procedure: COLONOSCOPY;  Surgeon: Danie Binder, MD;  [facility];  Service: Endoscopy;  Laterality: N/A;  2:00-moved to 1030 Pam to notify pt  . Colon resection N/A 10/20/2013    Procedure:  LAPAROSCOPIC hand assisted partial colectomy;  Surgeon: Jamesetta So, MD;  Location: [facility];  Service: General;  Laterality: N/A;    Social History:  reports that he has never smoked. He does not have any smokeless tobacco history on file. He reports that he does not drink alcohol or use illicit drugs.  Allergies:  Allergies  Allergen Reactions  . Niaspan [Niacin Er] Rash    Medications Prior to Admission  Medication Sig Dispense Refill  . AMITIZA 24 MCG capsule Take 24 mcg by mouth 2 (two) times daily with a meal.       . benzonatate (TESSALON) 100 MG capsule Take 100 mg  by mouth 2 (two) times daily as needed. For cough      . fluticasone (FLONASE) 50 MCG/ACT nasal spray Place 2 sprays into both nostrils daily.      Marland Kitchen HYDROcodone-acetaminophen (NORCO/VICODIN) 5-325 MG per tablet Take 1 tablet by mouth daily as needed. For pain      . omeprazole (PRILOSEC) 20 MG capsule Take 20 mg by mouth every morning.      Marland Kitchen oxyCODONE-acetaminophen (PERCOCET) 7.5-325 MG per tablet Take 1-2 tablets by mouth every 4 (four) hours as needed.  50 tablet  0  . pregabalin (LYRICA) 75 MG capsule Take 75 mg by mouth every evening.       . ranitidine (ZANTAC) 300 MG tablet Take 300 mg by mouth at bedtime.       . VENTOLIN HFA 108 (90 BASE) MCG/ACT inhaler Inhale 1-2 puffs into the lungs every 6 (six) hours as needed. For shortness of breath/wheezing      . ondansetron (ZOFRAN-ODT) 4 MG disintegrating tablet Take 4 mg by mouth 4 (four) times daily as needed. For nausea/vomiting      . rizatriptan (MAXALT) 10 MG tablet Take 10 mg by mouth as needed for migraine. May repeat in 2 hours if needed        Blood pressure 116/74, pulse 90, temperature 98.6 F (37 C), temperature source Oral, resp. rate 20, height 6\' 2"  (1.88 m), weight 207 lb 3.7 oz (94 kg), SpO2 97.00%. Physical Exam: General: pleasant, WD/WN white male who is laying in bed in NAD HEENT: head is normocephalic, atraumatic.  Sclera are noninjected.  PERRL.  Ears and nose without any masses or lesions.  Mouth is pink and moist Heart: regular, rate, and rhythm.  No obvious murmurs, gallops, or rubs noted.  Palpable pedal pulses bilaterally Lungs: CTAB, no wheezes, rhonchi, or rales noted.  Respiratory effort non-labored.  Good effort. Abd: soft, ND, tender around RUQ/right flank drain site, drainage is feculent brownish yellow, few BS, narrowly spaced staples at midline RLQ and LLQ to be removed MS: all 4 extremities are symmetrical with no cyanosis, clubbing, or edema. Skin: warm and dry with no masses, lesions, or rashes Psych:  A&Ox3 with an appropriate affect.  Results for orders placed during the hospital encounter of 10/27/13 (from the past 48 hour(s))  SURGICAL PCR SCREEN     Status: None   Collection Time    11/02/13 10:35 AM      Result Value Range   MRSA, PCR NEGATIVE  NEGATIVE   Staphylococcus aureus NEGATIVE  NEGATIVE   Comment:            The Xpert SA Assay (FDA     approved for NASAL specimens     in patients over 39 years of age),     is one component of     a comprehensive surveillance     program.  Test performance has  been validated by [facility] for patients greater     than or equal to 15 year old.     It is not intended     to diagnose infection nor to     guide or monitor treatment.   No results found.     Assessment/Plan S/p Right hemicolectomy for invasive adenocarcinoma with positive nodes (1/19) on 10/20/13 (Dr. Arnoldo Morale) Post op abdominal abscesses s/p IR perc drain on 10/28/13 Controlled EC fistula PCM/TPN   Plan: 1.  NPO, bowel rest, IVF, pain control, antiemetics 2.  PICC line and TPN 3.  Will not plan on operation at this time, this controlled EC fistula has a good chance to heal on its own with bowel rest 4.  Drain management 5.  Remove staples as they have been in for 2 weeks 6.  Ambulate and IS 7.  SCD's and start lovenox 8.  Switch to Invanz (day 1), has had 7 days of Zosyn, was on  Diflucan for 7 days? For thrush-which I will d/c  9.  Labs not checked in 3 days - ordered cmet and cbc, and prealbumin pending for tomorrow   Coralie Keens 11/03/2013, 2:40 PM Pager: [phone]

## 2013-11-03 NOTE — Progress Notes (Signed)
Pt transferred to [facility] via care link  Report called to [facility].

## 2013-11-03 NOTE — Progress Notes (Signed)
UR chart review completed.  

## 2013-11-03 NOTE — Progress Notes (Signed)
INITIAL NUTRITION ASSESSMENT  DOCUMENTATION CODES Per approved criteria  -Severe malnutrition in the context of acute illness   Pt meets criteria for severe MALNUTRITION in the context of acute illness as evidenced by energy intake wt loss >2% in 1 week and moderate loss of muscle mass.  INTERVENTION:  Add ProStat 30 ml TID (each 30 ml provides 100 kcal, 15 gr protein)  Continue Ensure Complete po BID, each supplement provides 350 kcal and 13 grams of protein  D/c Ensure pudding (pt doesn't like taste)  Add MVI daily  NUTRITION DIAGNOSIS: Inadequate oral intake related to abdominal abcess as evidenced by anastomotic leak, acute wt loss 14#,6% x 7 days.   Goal: Pt to meet >/= 90% of their estimated nutrition needs; not met  Monitor:   Nutrition support, tolerance of oral intake and diet advancement, labs and wt changes  Reason for Assessment: Length of Stay  68 y.o. male Patient Active Problem List   Diagnosis Date Noted  . Abscess of abdominal cavity 10/27/2013  . Colon cancer 10/20/2013  . Constipation - functional 10/07/2013  . Colon cancer screening 10/07/2013  . GERD (gastroesophageal reflux disease) 10/07/2013    ASSESSMENT: Pt is s/p status post laparoscopic hand-assisted right hemicolectomy 10/20/13 due to colon cancer. Presented on 10/27/13 with abdominal abscess and following assessment by surgeon pt may need revision of ileocolic anastomosis.  His oral intake has been very limited since first surgery 10/20/13. His diet was advanced and discharged home.  However he has been eating only soft foods such as applesauce, ice cream. Since 10/27/13 he has been primarily NPO/Clear liquids and has experienced intermittent nausea. Diet adv to full and he is drinking Ensure Complete during my visit. Brief nutrition focused exam completed. Muscle wasting noted quadriceps, patellar. Pt able to ambulate to bathroom but is c/o increased weakness. We discussed his nutrition goals  i.e.increasing protein-energy intake.  Height: Ht Readings from Last 1 Encounters:  10/27/13 6' 2"  (1.88 m)    Weight: Wt Readings from Last 1 Encounters:  10/27/13 224 lb (101.606 kg)  11/03/13-wt 210# (95.4 kg)  Ideal Body Weight: 190# (86.3 kg)  % Ideal Body Weight: 111%  Wt Readings from Last 10 Encounters:  10/27/13 224 lb (101.606 kg)  10/27/13 224 lb (101.606 kg)  10/20/13 226 lb (102.513 kg)  10/20/13 226 lb (102.513 kg)  10/19/13 226 lb (102.513 kg)  10/07/13 231 lb 6.4 oz (104.962 kg)  01/07/13 237 lb (107.502 kg)  01/07/13 237 lb (107.502 kg)    Usual Body Weight: 235#  % Usual Body Weight: 89%  BMI:  Body mass index is 28.75 kg/(m^2). overweight  Estimated Nutritional Needs: Kcal: 5329-9242 Protein: 123-143 grams Fluid: 2.3-2.4 liters daily  Skin: closed system drain right flank (output 20 ml)  Diet Order: Full Liquid  EDUCATION NEEDS: -Education needs addressed   Intake/Output Summary (Last 24 hours) at 11/03/13 1120 Last data filed at 11/03/13 0800  Gross per 24 hour  Intake    360 ml  Output     20 ml  Net    340 ml    Last BM: 11/01/13 loose, brown  Labs:   Recent Labs Lab 10/27/13 1915 10/29/13 0504 10/31/13 0604  NA 137 141 138  K 3.1* 3.2* 4.1  CL 96 102 100  CO2 26 27 25   BUN 26* 27* 24*  CREATININE 0.64 0.82 0.84  CALCIUM 8.9 8.6 8.8  GLUCOSE 110* 117* 90    CBG (last 3)  No results found  for this basename: GLUCAP,  in the last 72 hours  Scheduled Meds: . feeding supplement (ENSURE COMPLETE)  237 mL Oral BID BM  . feeding supplement (ENSURE)  1 Container Oral TID BM  . fluconazole (DIFLUCAN) IV  200 mg Intravenous Q24H  . fluticasone  2 spray Each Nare Daily  . pantoprazole  40 mg Oral Q1200  . piperacillin-tazobactam (ZOSYN)  IV  3.375 g Intravenous Q8H  . pregabalin  75 mg Oral QPM    Continuous Infusions: . dextrose 5 % and 0.45 % NaCl with KCl 40 mEq/L 20 mL/hr at 11/02/13 1031    Past Medical History   Diagnosis Date  . Arthritis   . DDD (degenerative disc disease), cervical   . Wears glasses   . GERD (gastroesophageal reflux disease)   . Headache(784.0)   . Neuropathy 2010  . Colon cancer 10/20/13    colon resection    Past Surgical History  Procedure Laterality Date  . Hemorrhoid surgery    . Carpal tunnel release  2011    left  . Knee arthroscopy  2001    rt and lt  . Upper gi endoscopy    . Colonoscopy    . Inguinal hernia repair      left x2  . Inguinal hernia repair      right  . Tonsillectomy    . Carpal tunnel release Right 01/07/2013    Procedure: CARPAL TUNNEL RELEASE;  Surgeon: Tennis Must, MD;  Location: [place];  Service: Orthopedics;  Laterality: Right;  . Colonoscopy N/A 10/18/2013    Procedure: COLONOSCOPY;  Surgeon: Danie Binder, MD;  [facility];  Service: Endoscopy;  Laterality: N/A;  2:00-moved to 1030 Pam to notify pt  . Colon resection N/A 10/20/2013    Procedure:  LAPAROSCOPIC hand assisted partial colectomy;  Surgeon: Jamesetta So, MD;  Location: [facility];  Service: General;  Laterality: N/A;    Colman Cater MS,RD,CSG,LDN Office: [phone] Pager: [phone]

## 2013-11-03 NOTE — H&P (Signed)
Pt seen chart films reviewed.  Anastamotic leak and fistula 2 weeks s/p Right hemicolectomy.  Pt with controlled fistula and drain and otherwise benign abdominal exam.  Cont ABX and may need bowel rest / TNA.

## 2013-11-03 NOTE — Progress Notes (Signed)
Peripherally Inserted Central Catheter/Midline Placement  The IV Nurse has discussed with the patient and/or persons authorized to consent for the patient, the purpose of this procedure and the potential benefits and risks involved with this procedure.  The benefits include less needle sticks, lab draws from the catheter and patient may be discharged home with the catheter.  Risks include, but not limited to, infection, bleeding, blood clot (thrombus formation), and puncture of an artery; nerve damage and irregular heat beat.  Alternatives to this procedure were also discussed. PICC placed by Carolee Rota RN  PICC/Midline Placement Documentation  PICC / Midline Double Lumen 70/35/00 PICC Left Basilic 51 cm 3 cm (Active)       Eduardo [name] 11/03/2013, 9:13 PM

## 2013-11-03 NOTE — Discharge Summary (Signed)
Physician Discharge Summary  Patient ID: Eduardo [name] MRN: [id number] DOB/AGE: [birth year] 66 y.o.  Admit date: 10/27/2013 Discharge date: 11/03/2013  Admission Diagnoses: Abdominal abscess, status post laparoscopic hand-assisted right hemicolectomy, anastomotic leak  Discharge Diagnoses: Same Active Problems:   Abscess of abdominal cavity   Discharged Condition: good  Hospital Course: Patient is a 68 year old white male who underwent a laparoscopic hand-assisted right hemicolectomy on 10/20/2013 for colon cancer at the hepatic flexure who presented on 10/27/2013 with generalized malaise. CT scan of the abdomen revealed an intra-abdominal abscess with extraluminal air within the peritoneal cavity. He did not have peritonitis. His white count was normal. He underwent image guided percutaneous drainage of the abscess on 10/28/2013. He tolerated the procedure well. Feculent material was seen. Over the next few days, his drainage became clear. He had a followup CT scan of the abdomen done on 10/31/2013 which revealed a resolving intra-abdominal abscess with appropriate catheter placement, and no anastomotic leak. The patient was started on a limited clear and full liquid diet gradually. Occasionally, he would have some air within the drainage bag.  After extensive discussion with the patient and family, they requested a second opinion concerning treatment of the abdominal abscess. They request a second opinion from a Glass blower/designer. I contacted [facility] and Dr. Brantley Stage will be accepting the patient on transfer. The family is aware of that he may or may not undergo exploration. I also told them I would be happy to take care of them when they are back in [place] as an outpatient as needed.  Significant Diagnostic Studies: CT scan of abdomen and pelvis, multiple  Treatments: procedures: percutaneous drainage catheter placement  Discharge Exam: Blood pressure 104/64, pulse 82,  temperature 98.3 F (36.8 C), temperature source Oral, resp. rate 20, height 6\' 2"  (1.88 m), weight 101.606 kg (224 lb), SpO2 94.00%. General appearance: alert, cooperative and no distress Resp: clear to auscultation bilaterally Cardio: regular rate and rhythm, S1, S2 normal, no murmur, click, rub or gallop GI: Soft. No particular tenderness noted. Drainage tube with decreased amount of some feculent material.  Disposition: Transfer to [facility]    Medication List    ASK your doctor about these medications       AMITIZA 24 MCG capsule  Generic drug:  lubiprostone  Take 24 mcg by mouth 2 (two) times daily with a meal.     benzonatate 100 MG capsule  Commonly known as:  TESSALON  Take 100 mg by mouth 2 (two) times daily as needed. For cough     fluticasone 50 MCG/ACT nasal spray  Commonly known as:  FLONASE  Place 2 sprays into both nostrils daily.     HYDROcodone-acetaminophen 5-325 MG per tablet  Commonly known as:  NORCO/VICODIN  Take 1 tablet by mouth daily as needed. For pain     omeprazole 20 MG capsule  Commonly known as:  PRILOSEC  Take 20 mg by mouth every morning.     ondansetron 4 MG disintegrating tablet  Commonly known as:  ZOFRAN-ODT  Take 4 mg by mouth 4 (four) times daily as needed. For nausea/vomiting     oxyCODONE-acetaminophen 7.5-325 MG per tablet  Commonly known as:  PERCOCET  Take 1-2 tablets by mouth every 4 (four) hours as needed.     pregabalin 75 MG capsule  Commonly known as:  LYRICA  Take 75 mg by mouth every evening.     ranitidine 300 MG tablet  Commonly known as:  ZANTAC  Take 300 mg by mouth at bedtime.     rizatriptan 10 MG tablet  Commonly known as:  MAXALT  Take 10 mg by mouth as needed for migraine. May repeat in 2 hours if needed     VENTOLIN HFA 108 (90 BASE) MCG/ACT inhaler  Generic drug:  albuterol  Inhale 1-2 puffs into the lungs every 6 (six) hours as needed. For shortness of breath/wheezing          Signed: Sisto Granillo A 11/03/2013, 11:28 AM

## 2013-11-04 DIAGNOSIS — K929 Disease of digestive system, unspecified: Secondary | ICD-10-CM

## 2013-11-04 DIAGNOSIS — E43 Unspecified severe protein-calorie malnutrition: Secondary | ICD-10-CM | POA: Insufficient documentation

## 2013-11-04 LAB — COMPREHENSIVE METABOLIC PANEL
ALBUMIN: 2.6 g/dL — AB (ref 3.5–5.2)
ALK PHOS: 74 U/L (ref 39–117)
ALT: 23 U/L (ref 0–53)
AST: 15 U/L (ref 0–37)
BILIRUBIN TOTAL: 0.4 mg/dL (ref 0.3–1.2)
BUN: 11 mg/dL (ref 6–23)
CO2: 23 mEq/L (ref 19–32)
Calcium: 9.1 mg/dL (ref 8.4–10.5)
Chloride: 97 mEq/L (ref 96–112)
Creatinine, Ser: 0.81 mg/dL (ref 0.50–1.35)
GFR calc Af Amer: >90 mL/min (ref >90)
GFR calc non Af Amer: >90 mL/min (ref >90)
Glucose, Bld: 103 mg/dL — ABNORMAL HIGH (ref 70–99)
POTASSIUM: 4.3 meq/L (ref 3.7–5.3)
Sodium: 134 mEq/L — ABNORMAL LOW (ref 137–147)
TOTAL PROTEIN: 7.4 g/dL (ref 6.0–8.3)

## 2013-11-04 LAB — PHOSPHORUS: Phosphorus: 3.4 mg/dL (ref 2.3–4.6)

## 2013-11-04 LAB — PREALBUMIN: PREALBUMIN: 13.9 mg/dL — AB (ref 17.0–34.0)

## 2013-11-04 LAB — DIFFERENTIAL
BASOS ABS: 0 10*3/uL (ref 0.0–0.1)
Basophils Relative: 0 % (ref 0–1)
Eosinophils Absolute: 0.4 10*3/uL (ref 0.0–0.7)
Eosinophils Relative: 4 % (ref 0–5)
LYMPHS ABS: 1.5 10*3/uL (ref 0.7–4.0)
LYMPHS PCT: 17 % (ref 12–46)
MONO ABS: 1.3 10*3/uL — AB (ref 0.1–1.0)
Monocytes Relative: 15 % — ABNORMAL HIGH (ref 3–12)
NEUTROS ABS: 5.6 10*3/uL (ref 1.7–7.7)
Neutrophils Relative %: 64 % (ref 43–77)

## 2013-11-04 LAB — CBC
HCT: 33.5 % — ABNORMAL LOW (ref 39.0–52.0)
Hemoglobin: 11.4 g/dL — ABNORMAL LOW (ref 13.0–17.0)
MCH: 29.8 pg (ref 26.0–34.0)
MCHC: 34 g/dL (ref 30.0–36.0)
MCV: 87.7 fL (ref 78.0–100.0)
Platelets: 491 10*3/uL — ABNORMAL HIGH (ref 150–400)
RBC: 3.82 MIL/uL — AB (ref 4.22–5.81)
RDW: 13.5 % (ref 11.5–15.5)
WBC: 8.7 10*3/uL (ref 4.0–10.5)

## 2013-11-04 LAB — GLUCOSE, CAPILLARY
Glucose-Capillary: 112 mg/dL — ABNORMAL HIGH (ref 70–99)
Glucose-Capillary: 121 mg/dL — ABNORMAL HIGH (ref 70–99)

## 2013-11-04 LAB — TRIGLYCERIDES: TRIGLYCERIDES: 145 mg/dL (ref <150)

## 2013-11-04 LAB — MAGNESIUM: Magnesium: 2.2 mg/dL (ref 1.5–2.5)

## 2013-11-04 MED ORDER — KCL IN DEXTROSE-NACL 40-5-0.45 MEQ/L-%-% IV SOLN
INTRAVENOUS | Status: AC
  Administered 2013-11-04: 13:00:00 via INTRAVENOUS
  Filled 2013-11-04: qty 1000

## 2013-11-04 MED ORDER — FAT EMULSION 20 % IV EMUL
120.0000 mL | INTRAVENOUS | Status: AC
  Administered 2013-11-04: 120 mL via INTRAVENOUS
  Filled 2013-11-04: qty 200

## 2013-11-04 MED ORDER — INSULIN ASPART 100 UNIT/ML ~~LOC~~ SOLN
0.0000 [IU] | SUBCUTANEOUS | Status: DC
  Administered 2013-11-04: 1 [IU] via SUBCUTANEOUS

## 2013-11-04 MED ORDER — CHLORHEXIDINE GLUCONATE 0.12 % MT SOLN
15.0000 mL | Freq: Two times a day (BID) | OROMUCOSAL | Status: DC
  Administered 2013-11-04 – 2013-11-11 (×14): 15 mL via OROMUCOSAL
  Filled 2013-11-04 (×12): qty 15

## 2013-11-04 MED ORDER — TRACE MINERALS CR-CU-F-FE-I-MN-MO-SE-ZN IV SOLN
INTRAVENOUS | Status: AC
  Administered 2013-11-04: 17:00:00 via INTRAVENOUS
  Filled 2013-11-04: qty 1000

## 2013-11-04 MED ORDER — KCL IN DEXTROSE-NACL 40-5-0.45 MEQ/L-%-% IV SOLN
INTRAVENOUS | Status: AC
  Filled 2013-11-04: qty 1000

## 2013-11-04 MED ORDER — BIOTENE DRY MOUTH MT LIQD
15.0000 mL | Freq: Two times a day (BID) | OROMUCOSAL | Status: DC
  Administered 2013-11-04 – 2013-11-10 (×12): 15 mL via OROMUCOSAL

## 2013-11-04 NOTE — Progress Notes (Signed)
PARENTERAL NUTRITION CONSULT NOTE - INITIAL  Pharmacy Consult for TPN Indication: EC fistula s/p R hemicolectomy  Allergies  Allergen Reactions  . Niaspan [Niacin Er] Rash    Patient Measurements: Height: 6\' 2"  (188 cm) Weight: 207 lb 3.7 oz (94 kg) IBW/kg (Calculated) : 82.2  Vital Signs: Temp: 98.1 F (36.7 C) (01/15 0556) Temp src: Oral (01/15 0556) BP: 101/59 mmHg (01/15 0556) Pulse Rate: 70 (01/15 0556) Intake/Output from previous day: 01/14 0701 - 01/15 0700 In: 1584.3 [P.O.:360; I.V.:1214.3] Out: 40 [Drains:40] Intake/Output from this shift:    Labs:  Recent Labs  11/03/13 1530 11/04/13 0545  WBC 11.9* 8.7  HGB 11.9* 11.4*  HCT 35.1* 33.5*  PLT 516* 491*     Recent Labs  11/03/13 1530 11/04/13 0545  NA 130* 134*  K 4.8 4.3  CL 95* 97  CO2 24 23  GLUCOSE 96 103*  BUN 11 11  CREATININE 0.96 0.81  CALCIUM 9.0 9.1  MG  --  2.2  PHOS  --  3.4  PROT  --  7.4  ALBUMIN  --  2.6*  AST  --  15  ALT  --  23  ALKPHOS  --  74  BILITOT  --  0.4  TRIG  --  145   Estimated Creatinine Clearance: 105.7 ml/min (by C-G formula based on Cr of 0.81).   No results found for this basename: GLUCAP,  in the last 72 hours  Insulin Requirements in the past 24 hours:  None, not on ssi  Current Nutrition: NPO except ice chips  GI: Had hemicolectomy 12/31.  He developed an intra-abdominal abscess with perc drainage 10/28/13.  Drainage is now feculent and foul smelling.  Controlled EC fistula to start TPN Endo: no h/o DM Lytes:  Na 134, K 4.3, Mag 2.2, Phos 3.4, Ca 9.1 Renal: SrCr 0.81  Urinated X 6 Pulm: RA Cards: VSS Hepatobil: LFTs wnl  Alb 2.6 Neuro: neuropathy ID: Afeb, WBC 8.7  On Invanz    Best Practices:  Lovenox, po ppi TPN Access: 1/14  PICC  TPN day#:1  Nutritional Goals:  2300-2400 kCal, 123-143 grams of protein per day Clinimix E 5/15 at 105 ml/hr with lipids 20% 240 ml daily will provide 126 gm protein and 2247 Kcal/day   Plan:  Will start  clinimix E 5/15 at 40 ml/hr and lipids 20% at 1ml/hr F/u am labs. Will add ssi q4 hours sens scale. F/u cbgs    Eduardo [name] 11/04/2013,9:25 AM

## 2013-11-04 NOTE — Progress Notes (Signed)
Subjective: In good spirits.  No N/V.  Drain put out 110mL since transferred here to [facility] - foul smelling feculent.  Still abdominal pain at drain site.  Had PICC placed, TPN not started yet.  Ambulated OOB 2 x yesterday.  Using IS.  NPO.  Afebrile, WBC normal this am.  Objective: Vital signs in last 24 hours: Temp:  [98.1 F (36.7 C)-98.9 F (37.2 C)] 98.1 F (36.7 C) (01/15 0556) Pulse Rate:  [70-90] 70 (01/15 0556) Resp:  [18-20] 18 (01/15 0556) BP: (98-119)/(59-74) 101/59 mmHg (01/15 0556) SpO2:  [94 %-97 %] 96 % (01/15 0556) Weight:  [207 lb 3.7 oz (94 kg)] 207 lb 3.7 oz (94 kg) (01/14 1247) Last BM Date: 11/01/13  Intake/Output from previous day: 01/14 0701 - 01/15 0700 In: 1584.3 [P.O.:360; I.V.:1214.3] Out: 40 [Drains:40] Intake/Output this shift:    PE: Gen:  Alert, NAD, pleasant Abd: Soft, tender at drain site, ND, +BS, no HSM, scars at midline, RLQ, LLQ well healed, staples removed, drain with yellowish brown feculent foul smelling drainage   Lab Results:   Recent Labs  11/03/13 1530 11/04/13 0545  WBC 11.9* 8.7  HGB 11.9* 11.4*  HCT 35.1* 33.5*  PLT 516* 491*   BMET  Recent Labs  11/03/13 1530 11/04/13 0545  NA 130* 134*  K 4.8 4.3  CL 95* 97  CO2 24 23  GLUCOSE 96 103*  BUN 11 11  CREATININE 0.96 0.81  CALCIUM 9.0 9.1   PT/INR No results found for this basename: LABPROT, INR,  in the last 72 hours CMP     Component Value Date/Time   NA 134* 11/04/2013 0545   K 4.3 11/04/2013 0545   CL 97 11/04/2013 0545   CO2 23 11/04/2013 0545   GLUCOSE 103* 11/04/2013 0545   BUN 11 11/04/2013 0545   CREATININE 0.81 11/04/2013 0545   CALCIUM 9.1 11/04/2013 0545   PROT 7.4 11/04/2013 0545   ALBUMIN 2.6* 11/04/2013 0545   AST 15 11/04/2013 0545   ALT 23 11/04/2013 0545   ALKPHOS 74 11/04/2013 0545   BILITOT 0.4 11/04/2013 0545   GFRNONAA >90 11/04/2013 0545   GFRAA >90 11/04/2013 0545   Lipase     Component Value Date/Time   LIPASE 31 10/27/2013 1915        Studies/Results: No results found.  Anti-infectives: Anti-infectives   Start     Dose/Rate Route Frequency Ordered Stop   11/03/13 1600  ertapenem (INVANZ) 1 g in sodium chloride 0.9 % 50 mL IVPB     1 g 100 mL/hr over 30 Minutes Intravenous Every 24 hours 11/03/13 1454     10/28/13 0600  piperacillin-tazobactam (ZOSYN) IVPB 3.375 g  Status:  Discontinued     3.375 g 12.5 mL/hr over 240 Minutes Intravenous 3 times per day 10/27/13 2340 11/03/13 1454   10/28/13 0300  fluconazole (DIFLUCAN) IVPB 200 mg  Status:  Discontinued     200 mg 100 mL/hr over 60 Minutes Intravenous Every 24 hours 10/28/13 0217 11/03/13 1507   10/28/13 0000  fluconazole (DIFLUCAN) IVPB 200 mg  Status:  Discontinued     200 mg 100 mL/hr over 60 Minutes Intravenous Every 24 hours 10/27/13 2340 10/28/13 0217   10/27/13 2200  piperacillin-tazobactam (ZOSYN) IVPB 3.375 g     3.375 g 12.5 mL/hr over 240 Minutes Intravenous  Once 10/27/13 2147 10/28/13 1610       Assessment/Plan S/p Right hemicolectomy for invasive adenocarcinoma with positive nodes (1/19) on 10/20/13 (Dr. Arnoldo Morale)  Post op abdominal abscesses s/p IR perc drain on 10/28/13  Anastomotic leak Controlled EC fistula  PCM/TPN  Leukocytosis - improved today to 8.7  Plan:  1. NPO, bowel rest, IVF, pain control, antiemetics  2. PICC line and TPN to start tonight 3. Will not plan on operation at this time, this controlled EC fistula has a good chance to heal on its own with bowel rest  4. Drain management  5. Staples removed yesterday 6. Ambulate and IS  7. SCD's and lovenox  8. Switch to Invanz (day 2/?), has had 7 days of Zosyn, was on Diflucan for 7 days? For thrush-which I discontinued 9. Labs improved, prealbumin pending     LOS: 8 days    Eduardo [name], Eduardo [name] 11/04/2013, 8:46 AM Pager: [phone]

## 2013-11-04 NOTE — Progress Notes (Signed)
CONSERVATIVE CARE.  HOPEFULLY WILL HEAL WITHOUT INTERVENTION.

## 2013-11-04 NOTE — Progress Notes (Signed)
NUTRITION FOLLOW UP  Intervention:   TPN per Pharmacy Diet advancement per MD RD to continue to monitor  Nutrition Dx:   Inadequate oral intake related to abdominal abcess as evidenced by anastomotic leak, acute wt loss 14#,6% x 7 days; ongoing  Goal:   Pt to meet >/= 90% of their estimated nutrition needs; not met  Monitor:   TPN initiation/rate Diet advancement Weight trends Labs  Assessment:   RD assessment 1/14: Pt is s/p status post laparoscopic hand-assisted right hemicolectomy 10/20/13 due to colon cancer. Presented on 10/27/13 with abdominal abscess and following assessment by surgeon pt may need revision of ileocolic anastomosis. His oral intake has been very limited since first surgery 10/20/13. His diet was advanced and discharged home. However he has been eating only soft foods such as applesauce, ice cream. Since 10/27/13 he has been primarily NPO/Clear liquids and has experienced intermittent nausea. Diet adv to full and he is drinking Ensure Complete during my visit. Brief nutrition focused exam completed. Muscle wasting noted quadriceps, patellar. Pt able to ambulate to bathroom but is c/o increased weakness. We discussed his nutrition goals i.e.increasing protein-energy intake.  1/15: RD consulted for new TPN/TNA. Pt is now NPO for bowel rest. PICC has been placed to start TPN. Per pharmacy, TPN goal is Clinimix E 5/15 at 105 ml/hr with lipids 20% 240 ml daily, which will provide 126 gm protein and 2247 Kcal/day. This will meet 98% of estimated energy needs and 100% of estimated protein needs. Weight has dropped 17 lbs since 10/27/13.  Labs: low sodium, low hemoglobin  Height: Ht Readings from Last 1 Encounters:  11/03/13 6' 2"  (1.88 m)    Weight Status:   Wt Readings from Last 1 Encounters:  11/03/13 207 lb 3.7 oz (94 kg)    Re-estimated needs:  Kcal: 2300-2500 Protein: 123-143 grams  Fluid: 2.3-2.4 liters daily  Skin: abdominal incision  Diet Order:  NPO   Intake/Output Summary (Last 24 hours) at 11/04/13 0956 Last data filed at 11/04/13 0700  Gross per 24 hour  Intake 1584.25 ml  Output     40 ml  Net 1544.25 ml    Last BM: 1/12   Labs:   Recent Labs Lab 10/31/13 0604 11/03/13 1530 11/04/13 0545  NA 138 130* 134*  K 4.1 4.8 4.3  CL 100 95* 97  CO2 25 24 23   BUN 24* 11 11  CREATININE 0.84 0.96 0.81  CALCIUM 8.8 9.0 9.1  MG  --   --  2.2  PHOS  --   --  3.4  GLUCOSE 90 96 103*    CBG (last 3)  No results found for this basename: GLUCAP,  in the last 72 hours  Scheduled Meds: . enoxaparin (LOVENOX) injection  40 mg Subcutaneous Q24H  . ertapenem  1 g Intravenous Q24H  . feeding supplement (ENSURE COMPLETE)  237 mL Oral BID BM  . feeding supplement (PRO-STAT SUGAR FREE 64)  30 mL Oral TID WC  . fluticasone  2 spray Each Nare Daily  . insulin aspart  0-9 Units Subcutaneous Q4H  . pantoprazole  40 mg Oral Q1200  . pregabalin  75 mg Oral QPM    Continuous Infusions: . dextrose 5 % and 0.45 % NaCl with KCl 40 mEq/L 75 mL/hr at 11/03/13 2314  . Marland KitchenTPN (CLINIMIX-E) Adult     And  . fat emulsion      Pryor Ochoa RD, LDN Inpatient Clinical Dietitian Pager: [phone] After Hours Pager: [phone]

## 2013-11-05 LAB — MAGNESIUM: MAGNESIUM: 2.2 mg/dL (ref 1.5–2.5)

## 2013-11-05 LAB — GLUCOSE, CAPILLARY
GLUCOSE-CAPILLARY: 119 mg/dL — AB (ref 70–99)
GLUCOSE-CAPILLARY: 107 mg/dL — AB (ref 70–99)
GLUCOSE-CAPILLARY: 105 mg/dL — AB (ref 70–99)
GLUCOSE-CAPILLARY: 106 mg/dL — AB (ref 70–99)
GLUCOSE-CAPILLARY: 114 mg/dL — AB (ref 70–99)
Glucose-Capillary: 117 mg/dL — ABNORMAL HIGH (ref 70–99)

## 2013-11-05 LAB — BASIC METABOLIC PANEL
BUN: 12 mg/dL (ref 6–23)
CHLORIDE: 95 meq/L — AB (ref 96–112)
CO2: 23 meq/L (ref 19–32)
Calcium: 9 mg/dL (ref 8.4–10.5)
Creatinine, Ser: 0.75 mg/dL (ref 0.50–1.35)
GFR calc Af Amer: >90 mL/min (ref >90)
GFR calc non Af Amer: >90 mL/min (ref >90)
GLUCOSE: 107 mg/dL — AB (ref 70–99)
Potassium: 4.2 mEq/L (ref 3.7–5.3)
Sodium: 132 mEq/L — ABNORMAL LOW (ref 137–147)

## 2013-11-05 LAB — PHOSPHORUS: Phosphorus: 3.5 mg/dL (ref 2.3–4.6)

## 2013-11-05 MED ORDER — FAT EMULSION 20 % IV EMUL
120.0000 mL | INTRAVENOUS | Status: AC
  Administered 2013-11-05: 120 mL via INTRAVENOUS
  Filled 2013-11-05 (×2): qty 200

## 2013-11-05 MED ORDER — TRACE MINERALS CR-CU-F-FE-I-MN-MO-SE-ZN IV SOLN
INTRAVENOUS | Status: AC
  Administered 2013-11-05: 19:00:00 via INTRAVENOUS
  Filled 2013-11-05: qty 2000

## 2013-11-05 MED ORDER — SODIUM CHLORIDE 0.9 % IV SOLN
INTRAVENOUS | Status: AC
  Administered 2013-11-05 – 2013-11-08 (×2): 20 mL/h via INTRAVENOUS
  Administered 2013-11-10: 04:00:00 via INTRAVENOUS

## 2013-11-05 NOTE — Progress Notes (Signed)
Patient ID: Eduardo [name], male   DOB: [birth year], 67 y.o.   MRN: [id number]   IR aware of pt admit Will place on our round list Thanks

## 2013-11-05 NOTE — Progress Notes (Signed)
Subjective: Pt had an episode of severe pain last night when he rolled over in bed his torso was twisted and when he work up he had severe pain over the drain site.  However, he says his pain is improved overall since being made NPO.  Started TPN last night.  Notes still fatigued.  No N/V.  IR drain continues to drain.   Objective: Vital signs in last 24 hours: Temp:  [98.4 F (36.9 C)-99.1 F (37.3 C)] 98.4 F (36.9 C) (01/16 0425) Pulse Rate:  [74-86] 80 (01/16 0425) Resp:  [18-20] 20 (01/16 0425) BP: (97-112)/(55-68) 108/64 mmHg (01/16 0616) SpO2:  [93 %-96 %] 96 % (01/16 0425) Last BM Date: 11/04/13  Intake/Output from previous day: 01/15 0701 - 01/16 0700 In: 1543.3 [I.V.:971; TPN:572.3] Out: 1190 [Urine:1150; Drains:40] Intake/Output this shift:    PE: Gen:  Alert, NAD, pleasant Card:  RRR, no M/G/R heard Pulm:  CTA, no W/R/R Abd: Very soft, tender at drain site, ND, +BS, no HSM, scars at midline, RLQ, LLQ well healed, staples removed, drain with yellowish brown feculent foul smelling drainage 73mL/24 hours    Lab Results:   Recent Labs  11/03/13 1530 11/04/13 0545  WBC 11.9* 8.7  HGB 11.9* 11.4*  HCT 35.1* 33.5*  PLT 516* 491*   BMET  Recent Labs  11/04/13 0545 11/05/13 0615  NA 134* 132*  K 4.3 4.2  CL 97 95*  CO2 23 23  GLUCOSE 103* 107*  BUN 11 12  CREATININE 0.81 0.75  CALCIUM 9.1 9.0   PT/INR No results found for this basename: LABPROT, INR,  in the last 72 hours CMP     Component Value Date/Time   NA 132* 11/05/2013 0615   K 4.2 11/05/2013 0615   CL 95* 11/05/2013 0615   CO2 23 11/05/2013 0615   GLUCOSE 107* 11/05/2013 0615   BUN 12 11/05/2013 0615   CREATININE 0.75 11/05/2013 0615   CALCIUM 9.0 11/05/2013 0615   PROT 7.4 11/04/2013 0545   ALBUMIN 2.6* 11/04/2013 0545   AST 15 11/04/2013 0545   ALT 23 11/04/2013 0545   ALKPHOS 74 11/04/2013 0545   BILITOT 0.4 11/04/2013 0545   GFRNONAA >90 11/05/2013 0615   GFRAA >90 11/05/2013 0615    Lipase     Component Value Date/Time   LIPASE 31 10/27/2013 1915       Studies/Results: No results found.  Anti-infectives: Anti-infectives   Start     Dose/Rate Route Frequency Ordered Stop   11/03/13 1600  ertapenem (INVANZ) 1 g in sodium chloride 0.9 % 50 mL IVPB     1 g 100 mL/hr over 30 Minutes Intravenous Every 24 hours 11/03/13 1454     10/28/13 0600  piperacillin-tazobactam (ZOSYN) IVPB 3.375 g  Status:  Discontinued     3.375 g 12.5 mL/hr over 240 Minutes Intravenous 3 times per day 10/27/13 2340 11/03/13 1454   10/28/13 0300  fluconazole (DIFLUCAN) IVPB 200 mg  Status:  Discontinued     200 mg 100 mL/hr over 60 Minutes Intravenous Every 24 hours 10/28/13 0217 11/03/13 1507   10/28/13 0000  fluconazole (DIFLUCAN) IVPB 200 mg  Status:  Discontinued     200 mg 100 mL/hr over 60 Minutes Intravenous Every 24 hours 10/27/13 2340 10/28/13 0217   10/27/13 2200  piperacillin-tazobactam (ZOSYN) IVPB 3.375 g     3.375 g 12.5 mL/hr over 240 Minutes Intravenous  Once 10/27/13 2147 10/28/13 0307       Assessment/Plan S/p Right  hemicolectomy for invasive adenocarcinoma with positive nodes (1/19) on 10/20/13 (Dr. Arnoldo Morale)  Post op abdominal abscesses s/p IR perc drain on 10/28/13  Anastomotic leak  Controlled EC fistula  PCM/TPN  Leukocytosis - improved yesterday to 8.7   Plan:  1. NPO, bowel rest, IVF, pain control, antiemetics  2. PICC line and TPN  3. Will not plan on operation at this time, this controlled EC fistula has a good chance to heal on its own with bowel rest  4. Drain management, ask IR to follow and recommend when f/u CT/drain study should be done 5. Staples removed 6. Ambulate and IS  7. SCD's and lovenox  8. Switch to Invanz (day 3/?), has had 7 days of Zosyn, was on Diflucan for 7 days - For thrush 9. Labs for tomorrow, prealbumin 13.9     LOS: 9 days    DORT, Countess Biebel 11/05/2013, 9:11 AM Pager: [phone]

## 2013-11-05 NOTE — Progress Notes (Signed)
  Subjective: Rt abd abscess drain placed 1/8 in IR Pt was an inpt at [facility] at the time. Now has transferred to [facility] We will follow Still "sick". Drain painful at times  Objective: Vital signs in last 24 hours: Temp:  [98.4 F (36.9 C)-99.1 F (37.3 C)] 98.4 F (36.9 C) (01/16 0425) Pulse Rate:  [74-86] 80 (01/16 0425) Resp:  [18-20] 20 (01/16 0425) BP: (97-112)/(55-68) 108/64 mmHg (01/16 0616) SpO2:  [93 %-96 %] 96 % (01/16 0425) Last BM Date: 11/04/13  Intake/Output from previous day: 01/15 0701 - 01/16 0700 In: 1543.3 [I.V.:971; TPN:572.3] Out: 1190 [Urine:1150; Drains:40] Intake/Output this shift:    PE:  Afeb; vss Drain  Intact; mild tenderness at site Output fecelent 40 cc yesterday; 30 cc in bag Wbc 8.7   Lab Results:   Recent Labs  11/03/13 1530 11/04/13 0545  WBC 11.9* 8.7  HGB 11.9* 11.4*  HCT 35.1* 33.5*  PLT 516* 491*   BMET  Recent Labs  11/04/13 0545 11/05/13 0615  NA 134* 132*  K 4.3 4.2  CL 97 95*  CO2 23 23  GLUCOSE 103* 107*  BUN 11 12  CREATININE 0.81 0.75  CALCIUM 9.1 9.0   PT/INR No results found for this basename: LABPROT, INR,  in the last 72 hours ABG No results found for this basename: PHART, PCO2, PO2, HCO3,  in the last 72 hours  Studies/Results: No results found.  Anti-infectives: Anti-infectives   Start     Dose/Rate Route Frequency Ordered Stop   11/03/13 1600  ertapenem (INVANZ) 1 g in sodium chloride 0.9 % 50 mL IVPB     1 g 100 mL/hr over 30 Minutes Intravenous Every 24 hours 11/03/13 1454     10/28/13 0600  piperacillin-tazobactam (ZOSYN) IVPB 3.375 g  Status:  Discontinued     3.375 g 12.5 mL/hr over 240 Minutes Intravenous 3 times per day 10/27/13 2340 11/03/13 1454   10/28/13 0300  fluconazole (DIFLUCAN) IVPB 200 mg  Status:  Discontinued     200 mg 100 mL/hr over 60 Minutes Intravenous Every 24 hours 10/28/13 0217 11/03/13 1507   10/28/13 0000  fluconazole (DIFLUCAN) IVPB 200 mg  Status:  Discontinued      200 mg 100 mL/hr over 60 Minutes Intravenous Every 24 hours 10/27/13 2340 10/28/13 0217   10/27/13 2200  piperacillin-tazobactam (ZOSYN) IVPB 3.375 g     3.375 g 12.5 mL/hr over 240 Minutes Intravenous  Once 10/27/13 2147 10/28/13 0307      Assessment/Plan: s/p Procedure(s): EXPLORATORY LAPAROTOMY (N/A)  abd abscess drain in place Output significant Will need Re CT when output minimal- less than 10 cc/day Will prob need drain injection before pull Will follow Plan per [facility]   LOS: 9 days    Eduardo [name] A 11/05/2013

## 2013-11-05 NOTE — Progress Notes (Addendum)
PARENTERAL NUTRITION CONSULT NOTE - follow up Pharmacy Consult for TPN Indication: EC fistula s/p R hemicolectomy  Allergies  Allergen Reactions  . Niaspan [Niacin Er] Rash    Patient Measurements: Height: 6\' 2"  (188 cm) Weight: 207 lb 3.7 oz (94 kg) IBW/kg (Calculated) : 82.2  Vital Signs: Temp: 98.4 F (36.9 C) (01/16 0425) Temp src: Oral (01/16 0425) BP: 108/64 mmHg (01/16 0616) Pulse Rate: 80 (01/16 0425) Intake/Output from previous day: 01/15 0701 - 01/16 0700 In: 1543.3 [I.V.:971; TPN:572.3] Out: 1190 [Urine:1150; Drains:40] Intake/Output from this shift:    Labs:  Recent Labs  11/03/13 1530 11/04/13 0545  WBC 11.9* 8.7  HGB 11.9* 11.4*  HCT 35.1* 33.5*  PLT 516* 491*     Recent Labs  11/03/13 1530 11/04/13 0545 11/05/13 0615  NA 130* 134* 132*  K 4.8 4.3 4.2  CL 95* 97 95*  CO2 24 23 23   GLUCOSE 96 103* 107*  BUN 11 11 12   CREATININE 0.96 0.81 0.75  CALCIUM 9.0 9.1 9.0  MG  --  2.2 2.2  PHOS  --  3.4 3.5  PROT  --  7.4  --   ALBUMIN  --  2.6*  --   AST  --  15  --   ALT  --  23  --   ALKPHOS  --  74  --   BILITOT  --  0.4  --   PREALBUMIN  --  13.9*  --   TRIG  --  145  --    Estimated Creatinine Clearance: 107 ml/min (by C-G formula based on Cr of 0.75).    Recent Labs  11/04/13 2346 11/05/13 0422 11/05/13 0744  GLUCAP 121* 119* 107*    Insulin Requirements in the past 24 hours:  1 units SSI, all CBGs < 150,   Current Nutrition: NPO except ice chips Prealbumin 13.9 on 1/15  GI: Had hemicolectomy 12/31.  He developed an intra-abdominal abscess with perc drainage 10/28/13.  Drainage feculent and foul smelling on 1/15.  Controlled EC fistula.  TPN started for nutrition support 1/15 at 40 ml/hr - well tolerated Endo: no h/o DM, all CBGs < 150 as desired Lytes:  Na 133, K 4.2, Mag 2.2 Hepatobil: LFTs wnl  Alb 2.6 TPN Access: 1/14  PICC  TPN day#:2  Nutritional Goals:  2300-2400 kCal, 123-143 grams of protein per day Clinimix E  5/15 at 105 ml/hr with lipids 20% 240 ml daily will provide 126 gm protein and 2269 Kcal/day   Plan:  1. Increase Clinimix E 5/15 to 80 ml/hr plus lipids at 10 ml/hr.  If tolerated will increase to goal rate of 105 ml/hr on Saturday 2. Continue SSI until at goal rate; hopefully will be able to DC it then 3. Am BMET, Mg, Phos 4. Change d545 20k at 30 ml/hr to NS at [facility] when TPN up to 80 ml/hr tonight 5. Consider changing PO PPI to IV as pt is NPO Eudelia Bunch, Pharm.D. [phone] 11/05/2013 9:14 AM

## 2013-11-05 NOTE — Progress Notes (Signed)
Conservative care for now.

## 2013-11-06 LAB — BASIC METABOLIC PANEL
BUN: 15 mg/dL (ref 6–23)
CHLORIDE: 96 meq/L (ref 96–112)
CO2: 24 mEq/L (ref 19–32)
Calcium: 8.7 mg/dL (ref 8.4–10.5)
Creatinine, Ser: 0.74 mg/dL (ref 0.50–1.35)
GFR calc non Af Amer: >90 mL/min (ref >90)
Glucose, Bld: 117 mg/dL — ABNORMAL HIGH (ref 70–99)
POTASSIUM: 4.1 meq/L (ref 3.7–5.3)
SODIUM: 133 meq/L — AB (ref 137–147)

## 2013-11-06 LAB — GLUCOSE, CAPILLARY
GLUCOSE-CAPILLARY: 113 mg/dL — AB (ref 70–99)
GLUCOSE-CAPILLARY: 100 mg/dL — AB (ref 70–99)
Glucose-Capillary: 110 mg/dL — ABNORMAL HIGH (ref 70–99)
Glucose-Capillary: 118 mg/dL — ABNORMAL HIGH (ref 70–99)
Glucose-Capillary: 118 mg/dL — ABNORMAL HIGH (ref 70–99)
Glucose-Capillary: 118 mg/dL — ABNORMAL HIGH (ref 70–99)

## 2013-11-06 LAB — CBC
HCT: 33.5 % — ABNORMAL LOW (ref 39.0–52.0)
Hemoglobin: 11.2 g/dL — ABNORMAL LOW (ref 13.0–17.0)
MCH: 29.3 pg (ref 26.0–34.0)
MCHC: 33.4 g/dL (ref 30.0–36.0)
MCV: 87.7 fL (ref 78.0–100.0)
PLATELETS: 488 10*3/uL — AB (ref 150–400)
RBC: 3.82 MIL/uL — ABNORMAL LOW (ref 4.22–5.81)
RDW: 13.3 % (ref 11.5–15.5)
WBC: 8.6 10*3/uL (ref 4.0–10.5)

## 2013-11-06 LAB — PHOSPHORUS: Phosphorus: 3.7 mg/dL (ref 2.3–4.6)

## 2013-11-06 LAB — MAGNESIUM: MAGNESIUM: 2.1 mg/dL (ref 1.5–2.5)

## 2013-11-06 MED ORDER — LORAZEPAM 2 MG/ML IJ SOLN
1.0000 mg | Freq: Every evening | INTRAMUSCULAR | Status: DC | PRN
  Administered 2013-11-06 – 2013-11-10 (×5): 1 mg via INTRAVENOUS
  Filled 2013-11-06 (×5): qty 1

## 2013-11-06 MED ORDER — FAT EMULSION 20 % IV EMUL
120.0000 mL | INTRAVENOUS | Status: AC
  Administered 2013-11-06: 120 mL via INTRAVENOUS
  Filled 2013-11-06 (×2): qty 200

## 2013-11-06 MED ORDER — TRACE MINERALS CR-CU-F-FE-I-MN-MO-SE-ZN IV SOLN
INTRAVENOUS | Status: AC
  Administered 2013-11-06: 18:00:00 via INTRAVENOUS
  Filled 2013-11-06: qty 2520

## 2013-11-06 NOTE — Progress Notes (Signed)
  Subjective: Pt doing ok; no new c/o; has some soreness at rt abd drain site  Objective: Vital signs in last 24 hours: Temp:  [97.9 F (36.6 C)-99.2 F (37.3 C)] 98.4 F (36.9 C) (01/17 0525) Pulse Rate:  [75-85] 75 (01/17 0525) Resp:  [17-20] 20 (01/17 0525) BP: (103-107)/(61-66) 103/61 mmHg (01/17 0525) SpO2:  [96 %-97 %] 97 % (01/17 0525) Last BM Date: 11/05/13  Intake/Output from previous day: 01/16 0701 - 01/17 0700 In: 1943.1 [I.V.:453.3; TPN:1479.8] Out: 1140 [Urine:1075; Drains:65] Intake/Output this shift:   RLQ drain intact, output 65 cc's feculent material; insertion site ok, mildly tender  Lab Results:   Recent Labs  11/04/13 0545 11/06/13 0422  WBC 8.7 8.6  HGB 11.4* 11.2*  HCT 33.5* 33.5*  PLT 491* 488*   BMET  Recent Labs  11/05/13 0615 11/06/13 0422  NA 132* 133*  K 4.2 4.1  CL 95* 96  CO2 23 24  GLUCOSE 107* 117*  BUN 12 15  CREATININE 0.75 0.74  CALCIUM 9.0 8.7   PT/INR No results found for this basename: LABPROT, INR,  in the last 72 hours ABG No results found for this basename: PHART, PCO2, PO2, HCO3,  in the last 72 hours  Studies/Results: No results found.  Anti-infectives: Anti-infectives   Start     Dose/Rate Route Frequency Ordered Stop   11/03/13 1600  ertapenem (INVANZ) 1 g in sodium chloride 0.9 % 50 mL IVPB     1 g 100 mL/hr over 30 Minutes Intravenous Every 24 hours 11/03/13 1454     10/28/13 0600  piperacillin-tazobactam (ZOSYN) IVPB 3.375 g  Status:  Discontinued     3.375 g 12.5 mL/hr over 240 Minutes Intravenous 3 times per day 10/27/13 2340 11/03/13 1454   10/28/13 0300  fluconazole (DIFLUCAN) IVPB 200 mg  Status:  Discontinued     200 mg 100 mL/hr over 60 Minutes Intravenous Every 24 hours 10/28/13 0217 11/03/13 1507   10/28/13 0000  fluconazole (DIFLUCAN) IVPB 200 mg  Status:  Discontinued     200 mg 100 mL/hr over 60 Minutes Intravenous Every 24 hours 10/27/13 2340 10/28/13 0217   10/27/13 2200   piperacillin-tazobactam (ZOSYN) IVPB 3.375 g     3.375 g 12.5 mL/hr over 240 Minutes Intravenous  Once 10/27/13 2147 10/28/13 0814      Assessment/Plan: S/p RLQ abscess drainage 1/8 secondary to apparent anast leak; cont current tx, drain irrigation, ambulate, check f/u CT early next week; can perform drain injection to confirm fistula if needed.  LOS: 10 days    Eduardo [name],D [facility] 11/06/2013

## 2013-11-06 NOTE — Progress Notes (Signed)
PARENTERAL NUTRITION CONSULT NOTE - follow up Pharmacy Consult for TPN Indication: EC fistula s/p R hemicolectomy  Allergies  Allergen Reactions  . Niaspan [Niacin Er] Rash    Patient Measurements: Height: 6\' 2"  (188 cm) Weight: 207 lb 3.7 oz (94 kg) IBW/kg (Calculated) : 82.2  Vital Signs: Temp: 98.4 F (36.9 C) (01/17 0525) Temp src: Oral (01/17 0525) BP: 103/61 mmHg (01/17 0525) Pulse Rate: 75 (01/17 0525) Intake/Output from previous day: 01/16 0701 - 01/17 0700 In: 1943.1 [I.V.:453.3; TPN:1479.8] Out: 1140 [Urine:1075; Drains:65] Intake/Output from this shift:    Labs:  Recent Labs  11/03/13 1530 11/04/13 0545 11/06/13 0422  WBC 11.9* 8.7 8.6  HGB 11.9* 11.4* 11.2*  HCT 35.1* 33.5* 33.5*  PLT 516* 491* 488*     Recent Labs  11/03/13 1530 11/04/13 0545 11/05/13 0615 11/06/13 0422  NA 130* 134* 132* 133*  K 4.8 4.3 4.2 4.1  CL 95* 97 95* 96  CO2 24 23 23 24   GLUCOSE 96 103* 107* 117*  BUN 11 11 12 15   CREATININE 0.96 0.81 0.75 0.74  CALCIUM 9.0 9.1 9.0 8.7  MG  --  2.2 2.2 2.1  PHOS  --  3.4 3.5 3.7  PROT  --  7.4  --   --   ALBUMIN  --  2.6*  --   --   AST  --  15  --   --   ALT  --  23  --   --   ALKPHOS  --  74  --   --   BILITOT  --  0.4  --   --   PREALBUMIN  --  13.9*  --   --   TRIG  --  145  --   --    Estimated Creatinine Clearance: 107 ml/min (by C-G formula based on Cr of 0.74).    Recent Labs  11/05/13 1952 11/05/13 2353 11/06/13 0343  GLUCAP 114* 117* 110*    Insulin Requirements in the past 24 hours:  0 units SSI, all CBGs < 150,   Current Nutrition: NPO except ice chips Prealbumin 13.9 on 1/15  GI: Had hemicolectomy 12/31.  He developed an intra-abdominal abscess with perc drainage 10/28/13.  Drainage feculent and foul smelling on 1/15.  Controlled EC fistula. Drain with significant output per IR   TPN started for nutrition support 1/15  Endo: no h/o DM, all CBGs < 150 as desired Lytes:  Na 133, K 4.1, Mag 2.1, phos  3.7 Hepatobil: LFTs wnl  Alb 2.6 TPN Access: 1/14  PICC  TPN day#:3  Nutritional Goals:  2300-2400 kCal, 123-143 grams of protein per day Clinimix E 5/15 at 105 ml/hr with lipids 20% 240 ml daily will provide 126 gm protein and 2269 Kcal/day   Plan:  1. Increase Clinimix E 5/15 to 105 ml/hr plus lipids at 10 ml/hr.   2. Continue SSI until at goal rate; hopefully will be able to DC it then 3. Am [employer] , Pharm.D. [phone] 11/06/2013 8:06 AM

## 2013-11-06 NOTE — Progress Notes (Signed)
  Subjective: No complaints.   Objective: Vital signs in last 24 hours: Temp:  [97.9 F (36.6 C)-99.2 F (37.3 C)] 98.4 F (36.9 C) (01/17 0525) Pulse Rate:  [75-85] 75 (01/17 0525) Resp:  [17-20] 20 (01/17 0525) BP: (103-107)/(61-66) 103/61 mmHg (01/17 0525) SpO2:  [96 %-97 %] 97 % (01/17 0525) Last BM Date: 11/05/13  Intake/Output from previous day: 01/16 0701 - 01/17 0700 In: 1943.1 [I.V.:453.3; TPN:1479.8] Out: 1140 [Urine:1075; Drains:65] Intake/Output this shift:    Resp: clear to auscultation bilaterally Cardio: regular rate and rhythm GI: soft, nontender. good bowel sounds. incision looks good. drain putting out feculent fluid  Lab Results:   Recent Labs  11/04/13 0545 11/06/13 0422  WBC 8.7 8.6  HGB 11.4* 11.2*  HCT 33.5* 33.5*  PLT 491* 488*   BMET  Recent Labs  11/05/13 0615 11/06/13 0422  NA 132* 133*  K 4.2 4.1  CL 95* 96  CO2 23 24  GLUCOSE 107* 117*  BUN 12 15  CREATININE 0.75 0.74  CALCIUM 9.0 8.7   PT/INR No results found for this basename: LABPROT, INR,  in the last 72 hours ABG No results found for this basename: PHART, PCO2, PO2, HCO3,  in the last 72 hours  Studies/Results: No results found.  Anti-infectives: Anti-infectives   Start     Dose/Rate Route Frequency Ordered Stop   11/03/13 1600  ertapenem (INVANZ) 1 g in sodium chloride 0.9 % 50 mL IVPB     1 g 100 mL/hr over 30 Minutes Intravenous Every 24 hours 11/03/13 1454     10/28/13 0600  piperacillin-tazobactam (ZOSYN) IVPB 3.375 g  Status:  Discontinued     3.375 g 12.5 mL/hr over 240 Minutes Intravenous 3 times per day 10/27/13 2340 11/03/13 1454   10/28/13 0300  fluconazole (DIFLUCAN) IVPB 200 mg  Status:  Discontinued     200 mg 100 mL/hr over 60 Minutes Intravenous Every 24 hours 10/28/13 0217 11/03/13 1507   10/28/13 0000  fluconazole (DIFLUCAN) IVPB 200 mg  Status:  Discontinued     200 mg 100 mL/hr over 60 Minutes Intravenous Every 24 hours 10/27/13 2340  10/28/13 0217   10/27/13 2200  piperacillin-tazobactam (ZOSYN) IVPB 3.375 g     3.375 g 12.5 mL/hr over 240 Minutes Intravenous  Once 10/27/13 2147 10/28/13 0307      Assessment/Plan: s/p Procedure(s): EXPLORATORY LAPAROTOMY (N/A) Continue drain Continue invanz TPN for nutritional support  LOS: 10 days    TOTH III,Safiyya Stokes S 11/06/2013

## 2013-11-07 LAB — GLUCOSE, CAPILLARY
GLUCOSE-CAPILLARY: 122 mg/dL — AB (ref 70–99)
Glucose-Capillary: 125 mg/dL — ABNORMAL HIGH (ref 70–99)

## 2013-11-07 MED ORDER — FAT EMULSION 20 % IV EMUL
120.0000 mL | INTRAVENOUS | Status: AC
  Administered 2013-11-07: 120 mL via INTRAVENOUS
  Filled 2013-11-07 (×2): qty 200

## 2013-11-07 MED ORDER — TRACE MINERALS CR-CU-F-FE-I-MN-MO-SE-ZN IV SOLN
INTRAVENOUS | Status: AC
  Administered 2013-11-07: 18:00:00 via INTRAVENOUS
  Filled 2013-11-07: qty 2520

## 2013-11-07 MED ORDER — PANTOPRAZOLE SODIUM 40 MG IV SOLR
40.0000 mg | INTRAVENOUS | Status: DC
  Administered 2013-11-07 – 2013-11-11 (×5): 40 mg via INTRAVENOUS
  Filled 2013-11-07 (×6): qty 40

## 2013-11-07 NOTE — Progress Notes (Signed)
PARENTERAL NUTRITION CONSULT NOTE - follow up Pharmacy Consult for TPN Indication: EC fistula s/p R hemicolectomy  Allergies  Allergen Reactions  . Niaspan [Niacin Er] Rash    Patient Measurements: Height: 6\' 2"  (188 cm) Weight: 207 lb 3.7 oz (94 kg) IBW/kg (Calculated) : 82.2  Vital Signs: Temp: 98.5 F (36.9 C) (01/18 0548) Temp src: Oral (01/17 2149) BP: 102/64 mmHg (01/18 0548) Pulse Rate: 76 (01/18 0548) Intake/Output from previous day: 01/17 0701 - 01/18 0700 In: 7124 [P.O.:50; I.V.:323; IV Piggyback:150; TPN:714] Out: 990 [Urine:900; Drains:90] Intake/Output from this shift:    Labs:  Recent Labs  11/06/13 0422  WBC 8.6  HGB 11.2*  HCT 33.5*  PLT 488*     Recent Labs  11/05/13 0615 11/06/13 0422  NA 132* 133*  K 4.2 4.1  CL 95* 96  CO2 23 24  GLUCOSE 107* 117*  BUN 12 15  CREATININE 0.75 0.74  CALCIUM 9.0 8.7  MG 2.2 2.1  PHOS 3.5 3.7   Estimated Creatinine Clearance: 107 ml/min (by C-G formula based on Cr of 0.74).    Recent Labs  11/06/13 2017 11/06/13 2316 11/07/13 0420  GLUCAP 118* 118* 125*    Insulin Requirements in the past 24 hours:  0 units SSI, all CBGs < 150,   Current Nutrition: NPO except ice chips Prealbumin 13.9 on 1/15  GI: Had hemicolectomy 12/31.  He developed an intra-abdominal abscess with perc drainage 10/28/13.  Drainage feculent and foul smelling on 1/15.  Controlled EC fistula. Drain with significant output per IR   TPN started for nutrition support 1/15  Good bowel sounds Endo: no h/o DM, all CBGs < 150 as desired Lytes:  No labs today Hepatobil: LFTs wnl  Alb 2.6 TPN Access: 1/14  PICC  TPN day#:4  Nutritional Goals:  2300-2400 kCal, 123-143 grams of protein per day Clinimix E 5/15 at 105 ml/hr with lipids 20% 240 ml daily will provide 126 gm protein and 2269 Kcal/day   Plan:  1. Cont Clinimix E 5/15 to 105 ml/hr plus lipids at 10 ml/hr.   2. D/C ssi 3. Am TPN labs  [place] ,  [place].D. [phone] 11/07/2013 7:53 AM

## 2013-11-07 NOTE — Progress Notes (Signed)
  Subjective: No complaints  Objective: Vital signs in last 24 hours: Temp:  [98.5 F (36.9 C)] 98.5 F (36.9 C) (01/18 0548) Pulse Rate:  [76-86] 76 (01/18 0548) Resp:  [18-20] 19 (01/18 0548) BP: (102-122)/(64-78) 102/64 mmHg (01/18 0548) SpO2:  [94 %-100 %] 94 % (01/18 0548) Last BM Date: 11/05/13  Intake/Output from previous day: 01/17 0701 - 01/18 0700 In: 1242 [P.O.:50; I.V.:323; IV Piggyback:150; TPN:714] Out: 990 [Urine:900; Drains:90] Intake/Output this shift:    Resp: clear to auscultation bilaterally Cardio: regular rate and rhythm GI: soft, nontender. drain with feculent output  Lab Results:   Recent Labs  11/06/13 0422  WBC 8.6  HGB 11.2*  HCT 33.5*  PLT 488*   BMET  Recent Labs  11/05/13 0615 11/06/13 0422  NA 132* 133*  K 4.2 4.1  CL 95* 96  CO2 23 24  GLUCOSE 107* 117*  BUN 12 15  CREATININE 0.75 0.74  CALCIUM 9.0 8.7   PT/INR No results found for this basename: LABPROT, INR,  in the last 72 hours ABG No results found for this basename: PHART, PCO2, PO2, HCO3,  in the last 72 hours  Studies/Results: No results found.  Anti-infectives: Anti-infectives   Start     Dose/Rate Route Frequency Ordered Stop   11/03/13 1600  ertapenem (INVANZ) 1 g in sodium chloride 0.9 % 50 mL IVPB     1 g 100 mL/hr over 30 Minutes Intravenous Every 24 hours 11/03/13 1454     10/28/13 0600  piperacillin-tazobactam (ZOSYN) IVPB 3.375 g  Status:  Discontinued     3.375 g 12.5 mL/hr over 240 Minutes Intravenous 3 times per day 10/27/13 2340 11/03/13 1454   10/28/13 0300  fluconazole (DIFLUCAN) IVPB 200 mg  Status:  Discontinued     200 mg 100 mL/hr over 60 Minutes Intravenous Every 24 hours 10/28/13 0217 11/03/13 1507   10/28/13 0000  fluconazole (DIFLUCAN) IVPB 200 mg  Status:  Discontinued     200 mg 100 mL/hr over 60 Minutes Intravenous Every 24 hours 10/27/13 2340 10/28/13 0217   10/27/13 2200  piperacillin-tazobactam (ZOSYN) IVPB 3.375 g     3.375  g 12.5 mL/hr over 240 Minutes Intravenous  Once 10/27/13 2147 10/28/13 0307      Assessment/Plan: [name] Procedure([name]): EXPLORATORY LAPAROTOMY (N/A) Continue tpn for nutritional support Continue invanz  LOS: 11 days    Eduardo [name],Eduardo [name] 11/07/2013

## 2013-11-08 ENCOUNTER — Inpatient Hospital Stay (HOSPITAL_COMMUNITY): Payer: Managed Care, Other (non HMO)

## 2013-11-08 DIAGNOSIS — D72829 Elevated white blood cell count, unspecified: Secondary | ICD-10-CM

## 2013-11-08 LAB — CBC
HCT: 33.5 % — ABNORMAL LOW (ref 39.0–52.0)
Hemoglobin: 11.1 g/dL — ABNORMAL LOW (ref 13.0–17.0)
MCH: 28.8 pg (ref 26.0–34.0)
MCHC: 33.1 g/dL (ref 30.0–36.0)
MCV: 86.8 fL (ref 78.0–100.0)
Platelets: 471 10*3/uL — ABNORMAL HIGH (ref 150–400)
RBC: 3.86 MIL/uL — ABNORMAL LOW (ref 4.22–5.81)
RDW: 13.2 % (ref 11.5–15.5)
WBC: 6.6 10*3/uL (ref 4.0–10.5)

## 2013-11-08 LAB — COMPREHENSIVE METABOLIC PANEL
ALK PHOS: 84 U/L (ref 39–117)
ALT: 42 U/L (ref 0–53)
AST: 30 U/L (ref 0–37)
Albumin: 2.7 g/dL — ABNORMAL LOW (ref 3.5–5.2)
BUN: 16 mg/dL (ref 6–23)
CO2: 24 mEq/L (ref 19–32)
Calcium: 9 mg/dL (ref 8.4–10.5)
Chloride: 98 mEq/L (ref 96–112)
Creatinine, Ser: 0.72 mg/dL (ref 0.50–1.35)
GFR calc Af Amer: >90 mL/min (ref >90)
GFR calc non Af Amer: >90 mL/min (ref >90)
Glucose, Bld: 100 mg/dL — ABNORMAL HIGH (ref 70–99)
Potassium: 4.4 mEq/L (ref 3.7–5.3)
SODIUM: 136 meq/L — AB (ref 137–147)
TOTAL PROTEIN: 7.7 g/dL (ref 6.0–8.3)
Total Bilirubin: 0.3 mg/dL (ref 0.3–1.2)

## 2013-11-08 LAB — DIFFERENTIAL
Basophils Absolute: 0 10*3/uL (ref 0.0–0.1)
Basophils Relative: 1 % (ref 0–1)
EOS ABS: 0.3 10*3/uL (ref 0.0–0.7)
Eosinophils Relative: 4 % (ref 0–5)
Lymphocytes Relative: 21 % (ref 12–46)
Lymphs Abs: 1.4 10*3/uL (ref 0.7–4.0)
MONOS PCT: 11 % (ref 3–12)
Monocytes Absolute: 0.7 10*3/uL (ref 0.1–1.0)
Neutro Abs: 4.2 10*3/uL (ref 1.7–7.7)
Neutrophils Relative %: 64 % (ref 43–77)

## 2013-11-08 LAB — TRIGLYCERIDES: TRIGLYCERIDES: 113 mg/dL (ref <150)

## 2013-11-08 LAB — PREALBUMIN: Prealbumin: 15.4 mg/dL — ABNORMAL LOW (ref 17.0–34.0)

## 2013-11-08 LAB — MAGNESIUM: Magnesium: 2.2 mg/dL (ref 1.5–2.5)

## 2013-11-08 LAB — PHOSPHORUS: Phosphorus: 3.7 mg/dL (ref 2.3–4.6)

## 2013-11-08 MED ORDER — CYCLOBENZAPRINE HCL 5 MG PO TABS
5.0000 mg | ORAL_TABLET | Freq: Three times a day (TID) | ORAL | Status: DC | PRN
  Administered 2013-11-08 – 2013-11-10 (×2): 5 mg via ORAL
  Filled 2013-11-08 (×2): qty 1

## 2013-11-08 MED ORDER — FAT EMULSION 20 % IV EMUL
120.0000 mL | INTRAVENOUS | Status: AC
  Administered 2013-11-08: 120 mL via INTRAVENOUS
  Filled 2013-11-08 (×2): qty 200

## 2013-11-08 MED ORDER — TRACE MINERALS CR-CU-F-FE-I-MN-MO-SE-ZN IV SOLN
INTRAVENOUS | Status: AC
  Administered 2013-11-08: 18:00:00 via INTRAVENOUS
  Filled 2013-11-08: qty 2520

## 2013-11-08 MED ORDER — BENZONATATE 100 MG PO CAPS
100.0000 mg | ORAL_CAPSULE | Freq: Three times a day (TID) | ORAL | Status: DC | PRN
  Administered 2013-11-08 – 2013-11-10 (×6): 100 mg via ORAL
  Filled 2013-11-08 (×6): qty 1

## 2013-11-08 NOTE — Progress Notes (Signed)
I have seen and examined the patient and agree with the assessment and plans. Continuing bowel rest and TNA  Jacquese Cassarino A. Ninfa Linden  MD, FACS

## 2013-11-08 NOTE — Progress Notes (Signed)
  Subjective: Pt doing ok; no new c/o  Objective: Vital signs in last 24 hours: Temp:  [97.4 F (36.3 C)-98.9 F (37.2 C)] 97.4 F (36.3 C) (01/19 0610) Pulse Rate:  [78-84] 78 (01/19 0610) Resp:  [17-18] 17 (01/19 0610) BP: (105-118)/(71) 105/71 mmHg (01/19 0610) SpO2:  [92 %-97 %] 92 % (01/19 0610) Last BM Date: 11/07/13  Intake/Output from previous day: 01/18 0701 - 01/19 0700 In: 4957.2 [I.V.:751.7; IV Piggyback:100; TPN:4100.5] Out: 70 [Drains:70] Intake/Output this shift:   RLQ drain intact, output still feculent.   Lab Results:   Recent Labs  11/06/13 0422 11/08/13 0555  WBC 8.6 6.6  HGB 11.2* 11.1*  HCT 33.5* 33.5*  PLT 488* 471*   BMET  Recent Labs  11/06/13 0422 11/08/13 0555  NA 133* 136*  K 4.1 4.4  CL 96 98  CO2 24 24  GLUCOSE 117* 100*  BUN 15 16  CREATININE 0.74 0.72  CALCIUM 8.7 9.0   PT/INR No results found for this basename: LABPROT, INR,  in the last 72 hours ABG No results found for this basename: PHART, PCO2, PO2, HCO3,  in the last 72 hours  Studies/Results: No results found.  Anti-infectives: Anti-infectives   Start     Dose/Rate Route Frequency Ordered Stop   11/03/13 1600  ertapenem (INVANZ) 1 g in sodium chloride 0.9 % 50 mL IVPB     1 g 100 mL/hr over 30 Minutes Intravenous Every 24 hours 11/03/13 1454     10/28/13 0600  piperacillin-tazobactam (ZOSYN) IVPB 3.375 g  Status:  Discontinued     3.375 g 12.5 mL/hr over 240 Minutes Intravenous 3 times per day 10/27/13 2340 11/03/13 1454   10/28/13 0300  fluconazole (DIFLUCAN) IVPB 200 mg  Status:  Discontinued     200 mg 100 mL/hr over 60 Minutes Intravenous Every 24 hours 10/28/13 0217 11/03/13 1507   10/28/13 0000  fluconazole (DIFLUCAN) IVPB 200 mg  Status:  Discontinued     200 mg 100 mL/hr over 60 Minutes Intravenous Every 24 hours 10/27/13 2340 10/28/13 0217   10/27/13 2200  piperacillin-tazobactam (ZOSYN) IVPB 3.375 g     3.375 g 12.5 mL/hr over 240 Minutes  Intravenous  Once 10/27/13 2147 10/28/13 1884      Assessment/Plan: S/p RLQ abscess drainage 1/8 secondary to apparent anast leak; cont current tx, drain irrigation, For CT next 1-2 days  LOS: 12 days    Ascencion Dike 11/08/2013

## 2013-11-08 NOTE — Progress Notes (Signed)
[facility]  Patient Status: New pt for [facility] this admission  [facility] is providing the following services:  HHRN and home infusion pharmacy services for home TNA. [facility] team will provide in hospital teaching regarding home TNA to [facility] if DC with TNA.  [facility] team will follow progress and support DC to home when deemed appropriate by MD.  If patient discharges after hours, please call [phone].   Larry Sierras 11/08/2013, 9:02 AM

## 2013-11-08 NOTE — Progress Notes (Signed)
PARENTERAL NUTRITION CONSULT NOTE - follow up Pharmacy Consult for TPN Indication: EC fistula s/p R hemicolectomy  Allergies  Allergen Reactions  . Niaspan [Niacin Er] Rash    Patient Measurements: Height: 6\' 2"  (188 cm) Weight: 207 lb 3.7 oz (94 kg) IBW/kg (Calculated) : 82.2  Vital Signs: Temp: 97.4 F (36.3 C) (01/19 0610) Temp src: Oral (01/18 2304) BP: 105/71 mmHg (01/19 0610) Pulse Rate: 78 (01/19 0610) Intake/Output from previous day: 01/18 0701 - 01/19 0700 In: 4957.2 [I.V.:751.7; IV Piggyback:100; TPN:4100.5] Out: 70 [Drains:70] Intake/Output from this shift:    Labs:  Recent Labs  11/06/13 0422 11/08/13 0555  WBC 8.6 6.6  HGB 11.2* 11.1*  HCT 33.5* 33.5*  PLT 488* 471*     Recent Labs  11/06/13 0422 11/08/13 0555  NA 133* 136*  K 4.1 4.4  CL 96 98  CO2 24 24  GLUCOSE 117* 100*  BUN 15 16  CREATININE 0.74 0.72  CALCIUM 8.7 9.0  MG 2.1 2.2  PHOS 3.7 3.7  PROT  --  7.7  ALBUMIN  --  2.7*  AST  --  30  ALT  --  42  ALKPHOS  --  84  BILITOT  --  0.3  TRIG  --  113   Estimated Creatinine Clearance: 107 ml/min (by C-G formula based on Cr of 0.72).    Recent Labs  11/06/13 2316 11/07/13 0420 11/07/13 0738  GLUCAP 118* 125* 122*    Insulin Requirements in the past 24 hours:  ssi dced  Current Nutrition: NPO except ice chips Prealbumin 13.9 on 1/15  GI: Had hemicolectomy 12/31.  He developed an intra-abdominal abscess with perc drainage 10/28/13.  Drainage feculent and foul smelling on 1/15.  Controlled EC fistula. Drain with significant output per IR   TPN started for nutrition support 1/15  Good bowel sounds,LBM 1/18  Prealb pending Endo: no h/o DM, all CBGs < 150 as desired Lytes:  Na 136, K 4.4, Phos 3.7, Mag 2.2 Hepatobil: LFTs wnl  Alb 2.7  TG 113 TPN Access: 1/14  PICC  TPN day#:5  Nutritional Goals:  2300-2400 kCal, 123-143 grams of protein per day Clinimix E 5/15 at 105 ml/hr with lipids 20% 240 ml daily will provide 126  gm protein and 2269 Kcal/day   Plan:  1. Cont Clinimix E 5/15 to 105 ml/hr plus lipids at 10 ml/hr.     Excell Seltzer , Pharm.D. [phone] 11/08/2013 8:04 AM

## 2013-11-08 NOTE — Progress Notes (Signed)
Notified Dr. Brantley Stage, surgeon on call regarding patients elevated temp.  Orders received for CT in a.m.

## 2013-11-08 NOTE — Progress Notes (Signed)
  Subjective: Only complaint is neck spasms.  No weakness.  No abdominal pain, having BMs, no dysuria.   Objective: Vital signs in last 24 hours: Temp:  [97.4 F (36.3 C)-98.9 F (37.2 C)] 97.4 F (36.3 C) (01/19 0610) Pulse Rate:  [78-84] 78 (01/19 0610) Resp:  [17-18] 17 (01/19 0610) BP: (105-118)/(71) 105/71 mmHg (01/19 0610) SpO2:  [92 %-97 %] 92 % (01/19 0610) Last BM Date: 11/07/13  Intake/Output from previous day: 01/18 0701 - 01/19 0700 In: 4957.2 [I.V.:751.7; IV Piggyback:100; TPN:4100.5] Out: 70 [Drains:70] Intake/Output this shift:   PE  General appearance: alert, cooperative and no distress Resp: clear to auscultation bilaterally Cardio: regular rate and rhythm, S1, S2 normal, no murmur, click, rub or gallop GI: soft, non-tender; bowel sounds normal; no masses,  no organomegaly.  Healing midline incision.  Rt abdomen drain with stool.   Lab Results:   Recent Labs  11/06/13 0422 11/08/13 0555  WBC 8.6 6.6  HGB 11.2* 11.1*  HCT 33.5* 33.5*  PLT 488* 471*   BMET  Recent Labs  11/06/13 0422 11/08/13 0555  NA 133* 136*  K 4.1 4.4  CL 96 98  CO2 24 24  GLUCOSE 117* 100*  BUN 15 16  CREATININE 0.74 0.72  CALCIUM 8.7 9.0   PT/INR No results found for this basename: LABPROT, INR,  in the last 72 hours ABG No results found for this basename: PHART, PCO2, PO2, HCO3,  in the last 72 hours  Studies/Results: No results found.  Anti-infectives: Anti-infectives   Start     Dose/Rate Route Frequency Ordered Stop   11/03/13 1600  ertapenem (INVANZ) 1 g in sodium chloride 0.9 % 50 mL IVPB     1 g 100 mL/hr over 30 Minutes Intravenous Every 24 hours 11/03/13 1454     10/28/13 0600  piperacillin-tazobactam (ZOSYN) IVPB 3.375 g  Status:  Discontinued     3.375 g 12.5 mL/hr over 240 Minutes Intravenous 3 times per day 10/27/13 2340 11/03/13 1454   10/28/13 0300  fluconazole (DIFLUCAN) IVPB 200 mg  Status:  Discontinued     200 mg 100 mL/hr over 60  Minutes Intravenous Every 24 hours 10/28/13 0217 11/03/13 1507   10/28/13 0000  fluconazole (DIFLUCAN) IVPB 200 mg  Status:  Discontinued     200 mg 100 mL/hr over 60 Minutes Intravenous Every 24 hours 10/27/13 2340 10/28/13 0217   10/27/13 2200  piperacillin-tazobactam (ZOSYN) IVPB 3.375 g     3.375 g 12.5 mL/hr over 240 Minutes Intravenous  Once 10/27/13 2147 10/28/13 4782      Assessment/Plan: S/p Right hemicolectomy for invasive adenocarcinoma with positive nodes (1/19) on 10/20/13 (Dr. Arnoldo Morale)  Post op abdominal abscesses s/p IR perc drain on 10/28/13  Anastomotic leak  Controlled EC fistula  PCM/TPN  Leukocytosis-resovled   Plan NPO, TPN Continue drain, will repeat CT in the next few days Ambulate SCDs and lovenox Invanz D#5 flexeril PRN for neck spasms    LOS: 12 days    Oneida Arenas [facility]  ANP-BC Pager [phone]  11/08/2013 8:22 AM

## 2013-11-09 ENCOUNTER — Inpatient Hospital Stay (HOSPITAL_COMMUNITY): Payer: Managed Care, Other (non HMO)

## 2013-11-09 ENCOUNTER — Encounter (HOSPITAL_COMMUNITY): Payer: Self-pay | Admitting: *Deleted

## 2013-11-09 LAB — GLUCOSE, CAPILLARY: Glucose-Capillary: 107 mg/dL — ABNORMAL HIGH (ref 70–99)

## 2013-11-09 MED ORDER — IOHEXOL 300 MG/ML  SOLN
100.0000 mL | Freq: Once | INTRAMUSCULAR | Status: AC | PRN
  Administered 2013-11-09: 100 mL via INTRAVENOUS

## 2013-11-09 MED ORDER — FAT EMULSION 20 % IV EMUL
120.0000 mL | INTRAVENOUS | Status: AC
  Administered 2013-11-09: 120 mL via INTRAVENOUS
  Filled 2013-11-09 (×2): qty 200

## 2013-11-09 MED ORDER — TRACE MINERALS CR-CU-F-FE-I-MN-MO-SE-ZN IV SOLN
INTRAVENOUS | Status: AC
  Administered 2013-11-09: 18:00:00 via INTRAVENOUS
  Filled 2013-11-09: qty 2520

## 2013-11-09 NOTE — Progress Notes (Signed)
  Subjective: No complaints.  Walking in hallways.  +flatus, no dysuria. No sob/cp  Objective: Vital signs in last 24 hours: Temp:  [98.4 F (36.9 C)-100.9 F (38.3 C)] 99.5 F (37.5 C) (01/20 0510) Pulse Rate:  [82-96] 90 (01/20 0510) Resp:  [18] 18 (01/20 0510) BP: (105-112)/(67-69) 110/69 mmHg (01/20 0510) SpO2:  [94 %-97 %] 94 % (01/20 0510) Last BM Date: 11/07/13  Intake/Output from previous day: 01/19 0701 - 01/20 0700 In: 1105 [I.V.:180; TPN:920] Out: 25 [Drains:25] Intake/Output this shift:   PE  General appearance: alert, cooperative and no distress  Resp: clear to auscultation bilaterally  Cardio: regular rate and rhythm, S1, S2 normal, no murmur, click, rub or gallop  GI: soft, non-tender; bowel sounds normal; no masses, no organomegaly. Healing midline incision. Rt abdomen drain with stool.   Output 31ml-->25ml/24h  Lab Results:   Recent Labs  11/08/13 0555  WBC 6.6  HGB 11.1*  HCT 33.5*  PLT 471*   BMET  Recent Labs  11/08/13 0555  NA 136*  K 4.4  CL 98  CO2 24  GLUCOSE 100*  BUN 16  CREATININE 0.72  CALCIUM 9.0   PT/INR No results found for this basename: LABPROT, INR,  in the last 72 hours ABG No results found for this basename: PHART, PCO2, PO2, HCO3,  in the last 72 hours  Studies/Results: Dg Chest Port 1 View  11/08/2013   CLINICAL DATA:  Cough  EXAM: PORTABLE CHEST - 1 VIEW  COMPARISON:  10/19/2013  FINDINGS: Left arm PICC extends to the cavoatrial junction. New platelike atelectasis or scarring in the right lower lung. Left lung clear. . No effusion. Visualized skeletal structures are unremarkable.  IMPRESSION: 1. PICC line to the cavoatrial junction. 2. Right lower lung linear atelectasis or scarring.   Electronically Signed   By: Arne Cleveland M.D.   On: 11/08/2013 13:52    Anti-infectives: Anti-infectives   Start     Dose/Rate Route Frequency Ordered Stop   11/03/13 1600  ertapenem (INVANZ) 1 g in sodium chloride 0.9 % 50 mL  IVPB     1 g 100 mL/hr over 30 Minutes Intravenous Every 24 hours 11/03/13 1454     10/28/13 0600  piperacillin-tazobactam (ZOSYN) IVPB 3.375 g  Status:  Discontinued     3.375 g 12.5 mL/hr over 240 Minutes Intravenous 3 times per day 10/27/13 2340 11/03/13 1454   10/28/13 0300  fluconazole (DIFLUCAN) IVPB 200 mg  Status:  Discontinued     200 mg 100 mL/hr over 60 Minutes Intravenous Every 24 hours 10/28/13 0217 11/03/13 1507   10/28/13 0000  fluconazole (DIFLUCAN) IVPB 200 mg  Status:  Discontinued     200 mg 100 mL/hr over 60 Minutes Intravenous Every 24 hours 10/27/13 2340 10/28/13 0217   10/27/13 2200  piperacillin-tazobactam (ZOSYN) IVPB 3.375 g     3.375 g 12.5 mL/hr over 240 Minutes Intravenous  Once 10/27/13 2147 10/28/13 7782      Assessment/Plan: S/p Right hemicolectomy for invasive adenocarcinoma with positive nodes (1/19) on 10/20/13 (Dr. Arnoldo Morale)  Post op abdominal abscesses s/p IR perc drain on 10/28/13  Anastomotic leak  Controlled EC fistula  PCM/TPN  Leukocytosis-resovled  Plan  NPO, TPN  Continue drain, repeat CT today Ambulate  SCDs and lovenox  Invanz D#6  flexeril PRN for neck spasms, improved IS, encouraged use given cxr, low grade temp.     LOS: 13 days    [place], [facility] 11/09/2013

## 2013-11-09 NOTE — Progress Notes (Signed)
PARENTERAL NUTRITION CONSULT NOTE - follow up Pharmacy Consult for TPN Indication: EC fistula s/p R hemicolectomy  Allergies  Allergen Reactions  . Niaspan [Niacin Er] Rash    Patient Measurements: Height: 6\' 2"  (188 cm) Weight: 207 lb 3.7 oz (94 kg) IBW/kg (Calculated) : 82.2  Vital Signs: Temp: 99.5 F (37.5 C) (01/20 0510) Temp src: Oral (01/20 0510) BP: 110/69 mmHg (01/20 0510) Pulse Rate: 90 (01/20 0510) Intake/Output from previous day: 01/19 0701 - 01/20 0700 In: 1105 [I.V.:180; TPN:920] Out: 25 [Drains:25] Intake/Output from this shift:    Labs:  Recent Labs  11/08/13 0555  WBC 6.6  HGB 11.1*  HCT 33.5*  PLT 471*     Recent Labs  11/08/13 0555  NA 136*  K 4.4  CL 98  CO2 24  GLUCOSE 100*  BUN 16  CREATININE 0.72  CALCIUM 9.0  MG 2.2  PHOS 3.7  PROT 7.7  ALBUMIN 2.7*  AST 30  ALT 42  ALKPHOS 84  BILITOT 0.3  PREALBUMIN 15.4*  TRIG 113   Estimated Creatinine Clearance: 107 ml/min (by C-G formula based on Cr of 0.72).    Recent Labs  11/06/13 2316 11/07/13 0420 11/07/13 0738  GLUCAP 118* 125* 122*    Insulin Requirements in the past 24 hours:  ssi dced  Current Nutrition: NPO except ice chips Prealbumin up to 15.4 from 13.9   GI: Had hemicolectomy 12/31.  He developed an intra-abdominal abscess with perc drainage 10/28/13.  Drainage feculent and foul smelling on 1/15.  Controlled EC fistula. Drain with significant output per IR   TPN started for nutrition support 1/15. Prealbumin up to 15.4 from 13.9.  For repeat CT scan today to f/u perc drain of abd abscesses.   Endo: no h/o DM, all CBGs < 150 as desired Lytes:  No labs today Hepatobil: LFTs wnl  Alb 2.7  TG 113 TPN Access: 1/14  PICC  TPN day#:7  Nutritional Goals:  2300-2400 kCal, 123-143 grams of protein per day Clinimix E 5/15 at 105 ml/hr with lipids 20% 240 ml daily will provide 126 gm protein and 2269 Kcal/day   Plan:  1. Change Clinimix E 5/15 at 105 ml/hr plus  lipids at 10 ml/hr to cycle over 18 hours 2. Add SSI with CBGs at 0800, 1300, 1600 and 2000 to assess tolerance of cyclic TPN rate 3. If tolerates 18 hr cycle will advance to 12 hr cycle Eudelia Bunch, Pharm.D. [phone] 11/09/2013 9:03 AM

## 2013-11-09 NOTE — Progress Notes (Signed)
NUTRITION FOLLOW UP  Intervention:   TPN per Pharmacy Diet advancement per MD RD to continue to monitor  Nutrition Dx:   Inadequate oral intake related to abdominal abcess as evidenced by anastomotic leak, acute wt loss 14#,6% x 7 days; ongoing  Goal:   Pt to meet >/= 90% of their estimated nutrition needs; being met  Monitor:   TPN initiation/rate Diet advancement Weight trends Labs  Assessment:   Pt is s/p status post laparoscopic hand-assisted right hemicolectomy 10/20/13 due to colon cancer. Presented on 10/27/13 with abdominal abscess. S/p IR perc drain on 10/28/13.  1/20: Per MD note f/u CT today shows decreased size of drained collection. RN re-weighed pt using bed scale at time of visit. Pt's weight continues to decrease and pt is now 30 lbs below usual body weight. Per Pharmacy note, Clinimix E 5/15 at 105 ml/hr with lipids 20% 240 ml daily will provide 126 gm protein and 2269 Kcal/day; change to cycle over 18 hours and if tolerates 18 hours cycle will advance to 12 hr cycle.  Will continue to monitor weight; if weight continues to trend down, pt may require higher rate of TPN.   Labs: low sodium, low hemoglobin  Height: Ht Readings from Last 1 Encounters:  11/03/13 6' 2"  (1.88 m)    Weight Status:   Wt Readings from Last 1 Encounters:  11/09/13 205 lb 1.6 oz (93.033 kg)    Re-estimated needs:  Kcal: 2390-2570 Protein: 120-140 grams  Fluid: 2.3-2.5 liters daily  Skin: abdominal incision with closed system drain  Diet Order: NPO   Intake/Output Summary (Last 24 hours) at 11/09/13 1508 Last data filed at 11/09/13 1400  Gross per 24 hour  Intake   3290 ml  Output     10 ml  Net   3280 ml    Last BM: 1/18   Labs:   Recent Labs Lab 11/05/13 0615 11/06/13 0422 11/08/13 0555  NA 132* 133* 136*  K 4.2 4.1 4.4  CL 95* 96 98  CO2 23 24 24   BUN 12 15 16   CREATININE 0.75 0.74 0.72  CALCIUM 9.0 8.7 9.0  MG 2.2 2.1 2.2  PHOS 3.5 3.7 3.7  GLUCOSE 107*  117* 100*    CBG (last 3)   Recent Labs  11/06/13 2316 11/07/13 0420 11/07/13 0738  GLUCAP 118* 125* 122*    Scheduled Meds: . antiseptic oral rinse  15 mL Mouth Rinse q12n4p  . chlorhexidine  15 mL Mouth Rinse BID  . enoxaparin (LOVENOX) injection  40 mg Subcutaneous Q24H  . ertapenem  1 g Intravenous Q24H  . fluticasone  2 spray Each Nare Daily  . pantoprazole (PROTONIX) IV  40 mg Intravenous Q24H    Continuous Infusions: . sodium chloride 20 mL/hr (11/08/13 0153)  . Marland KitchenTPN (CLINIMIX-E) Adult 105 mL/hr at 11/09/13 1415   And  . fat emulsion 120 mL (11/08/13 1758)  . Marland KitchenTPN (CLINIMIX-E) Adult     And  . fat emulsion      Pryor Ochoa RD, LDN Inpatient Clinical Dietitian Pager: [phone] After Hours Pager: [phone]

## 2013-11-09 NOTE — Progress Notes (Signed)
Subjective: Pt doing well; no new c/o  Objective: Vital signs in last 24 hours: Temp:  [98.4 F (36.9 C)-100.9 F (38.3 C)] 99.5 F (37.5 C) (01/20 0510) Pulse Rate:  [82-96] 90 (01/20 0510) Resp:  [18] 18 (01/20 0510) BP: (105-112)/(67-69) 110/69 mmHg (01/20 0510) SpO2:  [94 %-97 %] 94 % (01/20 0510) Last BM Date: 11/07/13  Intake/Output from previous day: 01/19 0701 - 01/20 0700 In: 1105 [I.V.:180; TPN:920] Out: 25 [Drains:25] Intake/Output this shift:    RLQ drain intact, output 25 cc's recorded  today- about 30-50 cc's liquid stool  in bag at present; insertion site ok,NT  Lab Results:   Recent Labs  11/08/13 0555  WBC 6.6  HGB 11.1*  HCT 33.5*  PLT 471*   BMET  Recent Labs  11/08/13 0555  NA 136*  K 4.4  CL 98  CO2 24  GLUCOSE 100*  BUN 16  CREATININE 0.72  CALCIUM 9.0   PT/INR No results found for this basename: LABPROT, INR,  in the last 72 hours ABG No results found for this basename: PHART, PCO2, PO2, HCO3,  in the last 72 hours  Studies/Results: Ct Abdomen Pelvis W Contrast  11/09/2013   CLINICAL DATA:  Status post right lower quadrant quadrant abscess drainage felt secondary to an anastomotic colonic leak.  EXAM: CT ABDOMEN AND PELVIS WITH CONTRAST  TECHNIQUE: Multidetector CT imaging of the abdomen and pelvis was performed using the standard protocol following bolus administration of intravenous contrast.  CONTRAST:  166mL OMNIPAQUE IOHEXOL 300 MG/ML SOLN, the patient also received oral contrast material.  COMPARISON:  CT scan of the abdomen pelvis dated October 31, 2013.  FINDINGS: In the right mid abdomen there is a drainage catheter in place lying adjacent to partially distended small bowel and right colonic loops at the level of the previous ileocolonic anastomosis. There is a small amount of extraluminal contrast present surrounding the loop of the drainage catheter on images 46 through 58 of series 2 and very small amount of extraluminal  gas is present on images 47 through 49 of series 2. A small amount of extraluminal gas is also visible adjacent to the liver under the right hemidiaphragm. There remain increased densities in the mesenteric fat surrounding the anastomosis and drainage catheter. There is no evidence of a small or large bowel obstruction. No no free fluid or contrast in the pelvis is demonstrated.  The liver exhibits no focal mass nor ductal dilation. The gallbladder is mildly distended and exhibits no evidence of stones. The spleen, partially distended stomach, pancreas, adrenal glands, and kidneys are normal in appearance. The caliber of the abdominal aorta is normal. The psoas musculature is normal in appearance. There are postsurgical changes in the midline of the anterior abdominal wall. There is no evidence of an abscess. A tiny amount of gas is again demonstrated along the inner aspect of the incision site however.  Within the pelvis the partially distended urinary bladder is normal in appearance. The prostate gland produces a mild impression upon the bladder base. The rectum is mildly distended with fluid and contrast and stool. The patient has undergone previous inguinal hernia repairs bilaterally.  The lung bases exhibit tip minimal compressive atelectasis in the posterior costophrenic gutters. There is no significant pleural effusion. There is a bullous lesion medially in the right lower lobe. The lumbar vertebral bodies are preserved in height. There are degenerative disc changes at L5-S1. The bony pelvis exhibits no acute abnormality.  IMPRESSION: 1. There remains  a small amount of extraluminal gas and contrast adjacent to the drainage catheter worrisome for an ongoing leak from the previous ileocolonic anastomosis. A small amount of gas is present in the subdiaphragmatic region on the right. There is no large fluid collection demonstrated. There is no small or large bowel obstruction. 2. There is no acute abnormality  demonstrated elsewhere within the abdomen or pelvis.   Electronically Signed   By: David  Martinique   On: 11/09/2013 09:30   Dg Chest Port 1 View  11/08/2013   CLINICAL DATA:  Cough  EXAM: PORTABLE CHEST - 1 VIEW  COMPARISON:  10/19/2013  FINDINGS: Left arm PICC extends to the cavoatrial junction. New platelike atelectasis or scarring in the right lower lung. Left lung clear. . No effusion. Visualized skeletal structures are unremarkable.  IMPRESSION: 1. PICC line to the cavoatrial junction. 2. Right lower lung linear atelectasis or scarring.   Electronically Signed   By: Arne Cleveland M.D.   On: 11/08/2013 13:52    Anti-infectives: Anti-infectives   Start     Dose/Rate Route Frequency Ordered Stop   11/03/13 1600  ertapenem (INVANZ) 1 g in sodium chloride 0.9 % 50 mL IVPB     1 g 100 mL/hr over 30 Minutes Intravenous Every 24 hours 11/03/13 1454     10/28/13 0600  piperacillin-tazobactam (ZOSYN) IVPB 3.375 g  Status:  Discontinued     3.375 g 12.5 mL/hr over 240 Minutes Intravenous 3 times per day 10/27/13 2340 11/03/13 1454   10/28/13 0300  fluconazole (DIFLUCAN) IVPB 200 mg  Status:  Discontinued     200 mg 100 mL/hr over 60 Minutes Intravenous Every 24 hours 10/28/13 0217 11/03/13 1507   10/28/13 0000  fluconazole (DIFLUCAN) IVPB 200 mg  Status:  Discontinued     200 mg 100 mL/hr over 60 Minutes Intravenous Every 24 hours 10/27/13 2340 10/28/13 0217   10/27/13 2200  piperacillin-tazobactam (ZOSYN) IVPB 3.375 g     3.375 g 12.5 mL/hr over 240 Minutes Intravenous  Once 10/27/13 2147 10/28/13 7564      Assessment/Plan: S/p right pelvic abscess drainage 1/8 with apparent fistula; f/u CT today shows decreased size of drained collection, no new collections; cont current tx; recommend drain injection study next week to confirm fistula (definitely before removing drain).  LOS: 13 days    Niti Leisure,D [facility] 11/09/2013

## 2013-11-09 NOTE — Progress Notes (Signed)
I have seen and examined the patient and agree with the assessment and plans.  Amani Nodarse A. Vicenta Olds  MD, FACS  

## 2013-11-10 LAB — GLUCOSE, CAPILLARY
GLUCOSE-CAPILLARY: 127 mg/dL — AB (ref 70–99)
Glucose-Capillary: 112 mg/dL — ABNORMAL HIGH (ref 70–99)
Glucose-Capillary: 103 mg/dL — ABNORMAL HIGH (ref 70–99)
Glucose-Capillary: 82 mg/dL (ref 70–99)
Glucose-Capillary: 123 mg/dL — ABNORMAL HIGH (ref 70–99)

## 2013-11-10 MED ORDER — INSULIN ASPART 100 UNIT/ML ~~LOC~~ SOLN
0.0000 [IU] | SUBCUTANEOUS | Status: DC
  Administered 2013-11-10: 1 [IU] via SUBCUTANEOUS

## 2013-11-10 MED ORDER — FAT EMULSION 20 % IV EMUL
240.0000 mL | INTRAVENOUS | Status: DC
  Administered 2013-11-10: 240 mL via INTRAVENOUS
  Filled 2013-11-10 (×2): qty 250

## 2013-11-10 MED ORDER — TRACE MINERALS CR-CU-F-FE-I-MN-MO-SE-ZN IV SOLN
INTRAVENOUS | Status: DC
  Administered 2013-11-10: 18:00:00 via INTRAVENOUS
  Filled 2013-11-10: qty 2000

## 2013-11-10 MED ORDER — SODIUM CHLORIDE 0.9 % IV SOLN: INTRAVENOUS | Status: DC

## 2013-11-10 NOTE — Progress Notes (Signed)
I have seen and examined the patient and agree with the assessment and plans.  Hannah Strader A. Antonae Zbikowski  MD, FACS  

## 2013-11-10 NOTE — Progress Notes (Signed)
Subjective: abd abscess drain placed 1/8 CT 1/20- see report Pt feeling some better Not eating yet restful  Objective: Vital signs in last 24 hours: Temp:  [98.2 F (36.8 C)-100.7 F (38.2 C)] 98.8 F (37.1 C) (01/21 0510) Pulse Rate:  [77-98] 98 (01/21 0510) Resp:  [18-20] 20 (01/21 0510) BP: (101-111)/(60-68) 111/68 mmHg (01/21 0510) SpO2:  [93 %-98 %] 93 % (01/21 0510) Weight:  [205 lb 1.6 oz (93.033 kg)] 205 lb 1.6 oz (93.033 kg) (01/20 1411) Last BM Date: 11/09/13  Intake/Output from previous day: 01/20 0701 - 01/21 0700 In: 5799 [I.V.:820; IV Piggyback:50; NUU:7253] Out: 316 [Urine:250; Drains:65; Stool:1] Intake/Output this shift:    PE:  Tmax 100.7 last pm; afeb now Site of drain NT; no bleeding Clean and dry Output feculent; air   Lab Results:   Recent Labs  11/08/13 0555  WBC 6.6  HGB 11.1*  HCT 33.5*  PLT 471*   BMET  Recent Labs  11/08/13 0555  NA 136*  K 4.4  CL 98  CO2 24  GLUCOSE 100*  BUN 16  CREATININE 0.72  CALCIUM 9.0   PT/INR No results found for this basename: LABPROT, INR,  in the last 72 hours ABG No results found for this basename: PHART, PCO2, PO2, HCO3,  in the last 72 hours  Studies/Results: Ct Abdomen Pelvis W Contrast  11/09/2013   CLINICAL DATA:  Status post right lower quadrant quadrant abscess drainage felt secondary to an anastomotic colonic leak.  EXAM: CT ABDOMEN AND PELVIS WITH CONTRAST  TECHNIQUE: Multidetector CT imaging of the abdomen and pelvis was performed using the standard protocol following bolus administration of intravenous contrast.  CONTRAST:  140mL OMNIPAQUE IOHEXOL 300 MG/ML SOLN, the patient also received oral contrast material.  COMPARISON:  CT scan of the abdomen pelvis dated October 31, 2013.  FINDINGS: In the right mid abdomen there is a drainage catheter in place lying adjacent to partially distended small bowel and right colonic loops at the level of the previous ileocolonic anastomosis.  There is a small amount of extraluminal contrast present surrounding the loop of the drainage catheter on images 46 through 58 of series 2 and very small amount of extraluminal gas is present on images 47 through 49 of series 2. A small amount of extraluminal gas is also visible adjacent to the liver under the right hemidiaphragm. There remain increased densities in the mesenteric fat surrounding the anastomosis and drainage catheter. There is no evidence of a small or large bowel obstruction. No no free fluid or contrast in the pelvis is demonstrated.  The liver exhibits no focal mass nor ductal dilation. The gallbladder is mildly distended and exhibits no evidence of stones. The spleen, partially distended stomach, pancreas, adrenal glands, and kidneys are normal in appearance. The caliber of the abdominal aorta is normal. The psoas musculature is normal in appearance. There are postsurgical changes in the midline of the anterior abdominal wall. There is no evidence of an abscess. A tiny amount of gas is again demonstrated along the inner aspect of the incision site however.  Within the pelvis the partially distended urinary bladder is normal in appearance. The prostate gland produces a mild impression upon the bladder base. The rectum is mildly distended with fluid and contrast and stool. The patient has undergone previous inguinal hernia repairs bilaterally.  The lung bases exhibit tip minimal compressive atelectasis in the posterior costophrenic gutters. There is no significant pleural effusion. There is a bullous lesion medially in the  right lower lobe. The lumbar vertebral bodies are preserved in height. There are degenerative disc changes at L5-S1. The bony pelvis exhibits no acute abnormality.  IMPRESSION: 1. There remains a small amount of extraluminal gas and contrast adjacent to the drainage catheter worrisome for an ongoing leak from the previous ileocolonic anastomosis. A small amount of gas is present  in the subdiaphragmatic region on the right. There is no large fluid collection demonstrated. There is no small or large bowel obstruction. 2. There is no acute abnormality demonstrated elsewhere within the abdomen or pelvis.   Electronically Signed   By: David  Martinique   On: 11/09/2013 09:30   Dg Chest Port 1 View  11/08/2013   CLINICAL DATA:  Cough  EXAM: PORTABLE CHEST - 1 VIEW  COMPARISON:  10/19/2013  FINDINGS: Left arm PICC extends to the cavoatrial junction. New platelike atelectasis or scarring in the right lower lung. Left lung clear. . No effusion. Visualized skeletal structures are unremarkable.  IMPRESSION: 1. PICC line to the cavoatrial junction. 2. Right lower lung linear atelectasis or scarring.   Electronically Signed   By: Arne Cleveland M.D.   On: 11/08/2013 13:52    Anti-infectives: Anti-infectives   Start     Dose/Rate Route Frequency Ordered Stop   11/03/13 1600  ertapenem (INVANZ) 1 g in sodium chloride 0.9 % 50 mL IVPB     1 g 100 mL/hr over 30 Minutes Intravenous Every 24 hours 11/03/13 1454     10/28/13 0600  piperacillin-tazobactam (ZOSYN) IVPB 3.375 g  Status:  Discontinued     3.375 g 12.5 mL/hr over 240 Minutes Intravenous 3 times per day 10/27/13 2340 11/03/13 1454   10/28/13 0300  fluconazole (DIFLUCAN) IVPB 200 mg  Status:  Discontinued     200 mg 100 mL/hr over 60 Minutes Intravenous Every 24 hours 10/28/13 0217 11/03/13 1507   10/28/13 0000  fluconazole (DIFLUCAN) IVPB 200 mg  Status:  Discontinued     200 mg 100 mL/hr over 60 Minutes Intravenous Every 24 hours 10/27/13 2340 10/28/13 0217   10/27/13 2200  piperacillin-tazobactam (ZOSYN) IVPB 3.375 g     3.375 g 12.5 mL/hr over 240 Minutes Intravenous  Once 10/27/13 2147 10/28/13 0307      Assessment/Plan: s/p Procedure(s): EXPLORATORY LAPAROTOMY (N/A)  abd abscess drain intact Continue drain Likely fistula- output feculent Will need inj before removal Plan per [facility]   LOS: 14 days     Azarian Starace A 11/10/2013

## 2013-11-10 NOTE — Progress Notes (Signed)
Patient ID: Eduardo [name], male   DOB: [birth year], 68 y.o.   MRN: [id number]  Subjective: No concerns.  Denies chills or sweats.  Having BMs, passing flatus.  Reports air in drain.  Objective:  Vital signs:  Filed Vitals:   11/09/13 1411 11/09/13 1616 11/09/13 2300 11/10/13 0510  BP:  105/65 101/60 111/68  Pulse:  77 90 98  Temp:  98.2 F (36.8 C) 100.7 F (38.2 C) 98.8 F (37.1 C)  TempSrc:  Oral Oral Oral  Resp:  19 18 20   Height:      Weight: 205 lb 1.6 oz (93.033 kg)     SpO2:  98% 96% 93%    Last BM Date: 11/09/13  Intake/Output   Yesterday:  01/20 0701 - 01/21 0700 In: 5799 [I.V.:820; IV Piggyback:50; XAJ:2878] Out: 316 [Urine:250; Drains:65; Stool:1] This shift:   Physical Exam: General: Pt awake/alert/oriented x3 in no acute distress Resp: clear to auscultation bilaterally  Cardio: regular rate and rhythm, S1, S2 normal, no murmur, click, rub or gallop  GI: soft, non-tender; bowel sounds normal; no masses, no organomegaly. Healing midline incision. Rt abdomen drain with stool and air. 81ml/24h recorded output Skin: No petechiae / purpura   Problem List:   Active Problems:   Abscess of abdominal cavity   Protein-calorie malnutrition, severe    Results:   Labs: Results for orders placed during the hospital encounter of 10/27/13 (from the past 48 hour(s))  GLUCOSE, CAPILLARY     Status: Abnormal   Collection Time    11/09/13  8:16 PM      Result Value Range   Glucose-Capillary 107 (*) 70 - 99 mg/dL    Imaging / Studies: Ct Abdomen Pelvis W Contrast  11/09/2013   CLINICAL DATA:  Status post right lower quadrant quadrant abscess drainage felt secondary to an anastomotic colonic leak.  EXAM: CT ABDOMEN AND PELVIS WITH CONTRAST  TECHNIQUE: Multidetector CT imaging of the abdomen and pelvis was performed using the standard protocol following bolus administration of intravenous contrast.  CONTRAST:  170mL OMNIPAQUE IOHEXOL 300 MG/ML SOLN, the patient also  received oral contrast material.  COMPARISON:  CT scan of the abdomen pelvis dated October 31, 2013.  FINDINGS: In the right mid abdomen there is a drainage catheter in place lying adjacent to partially distended small bowel and right colonic loops at the level of the previous ileocolonic anastomosis. There is a small amount of extraluminal contrast present surrounding the loop of the drainage catheter on images 46 through 58 of series 2 and very small amount of extraluminal gas is present on images 47 through 49 of series 2. A small amount of extraluminal gas is also visible adjacent to the liver under the right hemidiaphragm. There remain increased densities in the mesenteric fat surrounding the anastomosis and drainage catheter. There is no evidence of a small or large bowel obstruction. No no free fluid or contrast in the pelvis is demonstrated.  The liver exhibits no focal mass nor ductal dilation. The gallbladder is mildly distended and exhibits no evidence of stones. The spleen, partially distended stomach, pancreas, adrenal glands, and kidneys are normal in appearance. The caliber of the abdominal aorta is normal. The psoas musculature is normal in appearance. There are postsurgical changes in the midline of the anterior abdominal wall. There is no evidence of an abscess. A tiny amount of gas is again demonstrated along the inner aspect of the incision site however.  Within the pelvis the partially distended urinary bladder  is normal in appearance. The prostate gland produces a mild impression upon the bladder base. The rectum is mildly distended with fluid and contrast and stool. The patient has undergone previous inguinal hernia repairs bilaterally.  The lung bases exhibit tip minimal compressive atelectasis in the posterior costophrenic gutters. There is no significant pleural effusion. There is a bullous lesion medially in the right lower lobe. The lumbar vertebral bodies are preserved in height. There  are degenerative disc changes at L5-S1. The bony pelvis exhibits no acute abnormality.  IMPRESSION: 1. There remains a small amount of extraluminal gas and contrast adjacent to the drainage catheter worrisome for an ongoing leak from the previous ileocolonic anastomosis. A small amount of gas is present in the subdiaphragmatic region on the right. There is no large fluid collection demonstrated. There is no small or large bowel obstruction. 2. There is no acute abnormality demonstrated elsewhere within the abdomen or pelvis.   Electronically Signed   By: David  Martinique   On: 11/09/2013 09:30   Dg Chest Port 1 View  11/08/2013   CLINICAL DATA:  Cough  EXAM: PORTABLE CHEST - 1 VIEW  COMPARISON:  10/19/2013  FINDINGS: Left arm PICC extends to the cavoatrial junction. New platelike atelectasis or scarring in the right lower lung. Left lung clear. . No effusion. Visualized skeletal structures are unremarkable.  IMPRESSION: 1. PICC line to the cavoatrial junction. 2. Right lower lung linear atelectasis or scarring.   Electronically Signed   By: Arne Cleveland M.D.   On: 11/08/2013 13:52    Medications / Allergies: per chart  Antibiotics: Anti-infectives   Start     Dose/Rate Route Frequency Ordered Stop   11/03/13 1600  ertapenem (INVANZ) 1 g in sodium chloride 0.9 % 50 mL IVPB     1 g 100 mL/hr over 30 Minutes Intravenous Every 24 hours 11/03/13 1454     10/28/13 0600  piperacillin-tazobactam (ZOSYN) IVPB 3.375 g  Status:  Discontinued     3.375 g 12.5 mL/hr over 240 Minutes Intravenous 3 times per day 10/27/13 2340 11/03/13 1454   10/28/13 0300  fluconazole (DIFLUCAN) IVPB 200 mg  Status:  Discontinued     200 mg 100 mL/hr over 60 Minutes Intravenous Every 24 hours 10/28/13 0217 11/03/13 1507   10/28/13 0000  fluconazole (DIFLUCAN) IVPB 200 mg  Status:  Discontinued     200 mg 100 mL/hr over 60 Minutes Intravenous Every 24 hours 10/27/13 2340 10/28/13 0217   10/27/13 2200  piperacillin-tazobactam  (ZOSYN) IVPB 3.375 g     3.375 g 12.5 mL/hr over 240 Minutes Intravenous  Once 10/27/13 2147 10/28/13 7588      Assessment/Plan:  S/p Right hemicolectomy for invasive adenocarcinoma with positive nodes (1/19) on 10/20/13 (Dr. Arnoldo Morale)  Post op abdominal abscesses s/p IR perc drain on 10/28/13  Anastomotic leak  Controlled EC fistula  PCM/TPN  Leukocytosis-resovled  Plan  May have sips of clears, TPN  Continue drain Ambulate  SCDs and lovenox  Invanz D#7 ?duration and when to change to PO  IS Dispo: home with TPN, may have sips of clears. Home with drain, teach drain care.  Consult to case management  Erby Pian, [facility] Surgery Pager [phone] Office [phone]  11/10/2013 8:21 AM

## 2013-11-10 NOTE — Progress Notes (Signed)
PARENTERAL NUTRITION CONSULT NOTE - follow up Pharmacy Consult for TPN Indication: EC fistula s/p R hemicolectomy  Allergies  Allergen Reactions  . Niaspan [Niacin Er] Rash    Patient Measurements: Height: 6\' 2"  (188 cm) Weight: 205 lb 1.6 oz (93.033 kg) IBW/kg (Calculated) : 82.2  Vital Signs: Temp: 98.8 F (37.1 C) (01/21 0510) Temp src: Oral (01/21 0510) BP: 111/68 mmHg (01/21 0510) Pulse Rate: 98 (01/21 0510) Intake/Output from previous day: 01/20 0701 - 01/21 0700 In: 5799 [I.V.:820; IV Piggyback:50; UXL:2440] Out: 316 [Urine:250; Drains:65; Stool:1] Intake/Output from this shift:    Labs:  Recent Labs  11/08/13 0555  WBC 6.6  HGB 11.1*  HCT 33.5*  PLT 471*     Recent Labs  11/08/13 0555  NA 136*  K 4.4  CL 98  CO2 24  GLUCOSE 100*  BUN 16  CREATININE 0.72  CALCIUM 9.0  MG 2.2  PHOS 3.7  PROT 7.7  ALBUMIN 2.7*  AST 30  ALT 42  ALKPHOS 84  BILITOT 0.3  PREALBUMIN 15.4*  TRIG 113   Estimated Creatinine Clearance: 107 ml/min (by C-G formula based on Cr of 0.72).    Recent Labs  11/09/13 2016 11/10/13 0807  GLUCAP 107* 112*    Insulin Requirements in the past 24 hours:  ssi dc'd 1/18  Nutritional Goals:  2300-2400 kCal, 123-143 grams of protein per day  Current Nutrition: Clinimix E 5/15 at 105 ml/hr with lipids 20% 240 ml daily will provide 126 gm protein and 2269 Kcal/day May have sips of clears  GI: Had hemicolectomy 12/31.  He developed an intra-abdominal abscess with perc drainage 10/28/13.  Drainage feculent and foul smelling on 1/15.  Controlled EC fistula. Drain with significant output per IR   TPN started for nutrition support 1/15- plan is for him to go home with TPN. Continuing drain   Endo: no h/o DM, all CBGs < 150 as desired, SSI was d/c'd 1/18  Lytes:  No labs today. Labs from 1/19- Na 136, K 4.4, Phos 3.7, Mag 2.2, CorCa 10  Hepatobil: LFTs wnl except low Alb at 2.7; TG 113, Prealbumin up to 15.4 from 13.9   TPN  Access: 1/14  PICC  TPN day#: 8  Plan:  1. Continue 18 hour cyclic TPN- Clinimix E 1/02 at 50 ml/hr from 1800-1900, then run at 134 ml/hr x 1900-1100, then run at 50 ml/hr 1100-1200. Keep multivitamin and full trace elements in TPN. 2. Cyclic Lipid emulsion 72% at 56mL/hr over 18 hours 2. Added SSI with CBGs at 0800, 1300, 1600 and 2000 to assess tolerance of cyclic TPN rate 3. If tolerates 18 hr cycle will advance to 12 hr cycle- no able to assess today without labs. Full labs tomorrow. 4. Continue IVF NS at [facility] when not on TPN  Levonne Carreras D. Lindy Pennisi, PharmD, BCPS Clinical Pharmacist Pager: [phone] 11/10/2013 11:06 AM

## 2013-11-11 LAB — CBC
HCT: 32 % — ABNORMAL LOW (ref 39.0–52.0)
HEMOGLOBIN: 10.6 g/dL — AB (ref 13.0–17.0)
MCH: 28.9 pg (ref 26.0–34.0)
MCHC: 33.1 g/dL (ref 30.0–36.0)
MCV: 87.2 fL (ref 78.0–100.0)
Platelets: 274 10*3/uL (ref 150–400)
RBC: 3.67 MIL/uL — ABNORMAL LOW (ref 4.22–5.81)
RDW: 13.5 % (ref 11.5–15.5)
WBC: 4.4 10*3/uL (ref 4.0–10.5)

## 2013-11-11 LAB — MAGNESIUM: Magnesium: 2.1 mg/dL (ref 1.5–2.5)

## 2013-11-11 LAB — COMPREHENSIVE METABOLIC PANEL
ALBUMIN: 2.5 g/dL — AB (ref 3.5–5.2)
ALT: 55 U/L — ABNORMAL HIGH (ref 0–53)
AST: 31 U/L (ref 0–37)
Alkaline Phosphatase: 82 U/L (ref 39–117)
BUN: 17 mg/dL (ref 6–23)
CO2: 26 mEq/L (ref 19–32)
Calcium: 8.3 mg/dL — ABNORMAL LOW (ref 8.4–10.5)
Chloride: 102 mEq/L (ref 96–112)
Creatinine, Ser: 0.65 mg/dL (ref 0.50–1.35)
GFR calc Af Amer: >90 mL/min (ref >90)
GFR calc non Af Amer: >90 mL/min (ref >90)
Glucose, Bld: 114 mg/dL — ABNORMAL HIGH (ref 70–99)
Potassium: 4.1 mEq/L (ref 3.7–5.3)
Sodium: 139 mEq/L (ref 137–147)
TOTAL PROTEIN: 6.9 g/dL (ref 6.0–8.3)
Total Bilirubin: 0.2 mg/dL — ABNORMAL LOW (ref 0.3–1.2)

## 2013-11-11 LAB — GLUCOSE, CAPILLARY
Glucose-Capillary: 136 mg/dL — ABNORMAL HIGH (ref 70–99)
Glucose-Capillary: 111 mg/dL — ABNORMAL HIGH (ref 70–99)

## 2013-11-11 LAB — PHOSPHORUS: Phosphorus: 3.5 mg/dL (ref 2.3–4.6)

## 2013-11-11 MED ORDER — OXYCODONE-ACETAMINOPHEN 5-325 MG PO TABS: 1.0000 | ORAL_TABLET | ORAL | Status: DC | PRN

## 2013-11-11 MED ORDER — CIPROFLOXACIN HCL 500 MG PO TABS: 500.0000 mg | ORAL_TABLET | Freq: Two times a day (BID) | ORAL | Status: DC

## 2013-11-11 MED ORDER — METRONIDAZOLE 500 MG PO TABS: 500.0000 mg | ORAL_TABLET | Freq: Three times a day (TID) | ORAL | Status: AC

## 2013-11-11 MED ORDER — OXYCODONE-ACETAMINOPHEN 5-325 MG PO TABS
1.0000 | ORAL_TABLET | ORAL | Status: DC | PRN
  Administered 2013-11-11 (×2): 2 via ORAL
  Filled 2013-11-11 (×2): qty 2

## 2013-11-11 MED ORDER — HEPARIN SOD (PORK) LOCK FLUSH 100 UNIT/ML IV SOLN
250.0000 [IU] | INTRAVENOUS | Status: AC | PRN
  Administered 2013-11-11: 500 [IU]

## 2013-11-11 NOTE — Progress Notes (Signed)
PARENTERAL NUTRITION CONSULT NOTE  Pharmacy Consult for TPN Indication: EC fistula s/p R hemicolectomy  Allergies  Allergen Reactions  . Niaspan [Niacin Er] Rash    Patient Measurements: Height: 6' 2"  (188 cm) Weight: 205 lb 1.6 oz (93.033 kg) IBW/kg (Calculated) : 82.2  Vital Signs: Temp: 98.5 F (36.9 C) (01/22 0501) Temp src: Oral (01/22 0501) BP: 98/63 mmHg (01/22 0501) Pulse Rate: 80 (01/22 0501) Intake/Output from previous day: 01/21 0701 - 01/22 0700 In: 3485.6 [I.V.:489; NTZ:0017.4] Out: 47 [Urine:2; Drains:45] Intake/Output from this shift:    Labs:  Recent Labs  11/11/13 0545  WBC 4.4  HGB 10.6*  HCT 32.0*  PLT 274     Recent Labs  11/11/13 0545  NA 139  K 4.1  CL 102  CO2 26  GLUCOSE 114*  BUN 17  CREATININE 0.65  CALCIUM 8.3*  MG 2.1  PHOS 3.5  PROT 6.9  ALBUMIN 2.5*  AST 31  ALT 55*  ALKPHOS 82  BILITOT 0.2*   Estimated Creatinine Clearance: 107 ml/min (by C-G formula based on Cr of 0.65).    Recent Labs  11/10/13 2340 11/11/13 0406 11/11/13 0737  GLUCAP 127* 136* 111*   Insulin Requirements in the past 24 hours:  1 unit SSI  Nutritional Goals:  2300-2400 kCal, 123-143 grams of protein per day  Current Nutrition:  Clinimix E 5/15 + lipid emulsion cycled over 18 hours- receiving 2244 mL total of TPN, providing 112g protein and 2226kcal daily May have sips of clears  GI: Had hemicolectomy 12/31.  He developed an intra-abdominal abscess with perc drainage 10/28/13.  Drainage feculent and foul smelling on 1/15.  Controlled EC fistula. Drain with significant output per IR. TPN started for nutrition support 1/15- plan is for him to go home with TPN- d/c summary is currently in process. Continuing drain.  Endo: no h/o DM, all CBGs < 150 as desired, SSI started for cyclic TPN yesterday- minimal amount used  Lytes: tolerated cyclic TPN; Na 944, K 4.1, Phos 3.5, Mag 2.1- all WNL.   Hepatobil: Low Alb at 2.5, ALT elevated slightly  at 55, Tbili low at 0.2, alk phos and AST nml; TG 113 from 1/19- should have been repeated today, but appears it was cancelled as a duplicate per lab- unsure why it is considered a duplicate since labs were entered as part of TPN panel, Prealbumin up to 15.4 (1/19)  TPN Access: 1/14  PICC  TPN day#: 9  Plan:  1. If patient would stay, our protocol would convert him to a 12 hour cyclic TPN- Clinimix E 9/67 + multivitamin and full trace elements at 50 ml/hr from 1800-1900, then run at 200 ml/hr from 1900-0500, then run at 50 ml/hr 0500-0600. Also cycle lipid emulsion 20% over 12 hours. This will provide 2172m total fluid, 105g protein and 1680kcal. Keep multivitamin and full trace elements in TPN. 2. Will NOT make a bag for Mr. SLindertonight as per EEstill Bakeshe will be d/c'd home with TPN. [facility] 4. Follow up any assistance pharmacy can provide for outpatient TPN planning   Kelia Gibbon D. Isaish Alemu, PharmD, BCPS Clinical Pharmacist Pager: [phone]/22/2015 10:55 AM

## 2013-11-11 NOTE — Discharge Instructions (Signed)
DRAIN CARE:   You have a closed bulb drain to help you heal.  A bulb drain is a small, plastic reservoir which creates a gentle suction. It is used to remove excess fluid from a surgical wound. The color and amount of fluid will vary. Immediately after surgery, the fluid is bright red. It may gradually change to a yellow color. When the amount decreases to about 1 or 2 tablespoons (15 to 30 cc) per 24 hours, your caregiver will usually remove it.  DAILY CARE  Keep the bulb compressed at all times, except while emptying it. The compression creates suction.   Keep sites where the tubes enter the skin dry and covered with a light bandage (dressing).   Tape the tubes to your skin, 1 to 2 inches below the insertion sites, to keep from pulling on your stitches. Tubes are stitched in place and will not slip out.   Pin the bulb to your shirt (not to your pants) with a safety pin.   For the first few days after surgery, there usually is more fluid in the bulb. Empty the bulb whenever it becomes half full because the bulb does not create enough suction if it is too full. Include this amount in your 24 hour totals.   When the amount of drainage decreases, empty the bulb at the same time every day. Write down the amounts and the 24 hour totals. Your caregiver will want to know them. This helps your caregiver know when the tubes can be removed.   If there is drainage around the tube sites, change dressings and keep the area dry. If you see a clot in the tube, leave it alone. However, if the tube does not appear to be draining, let your caregiver know.  TO EMPTY THE BULB  Open the stopper to release suction.   Holding the stopper out of the way, pour drainage into the measuring cup that was sent home with you.   Measure and write down the amount. If there are 2 bulbs, note the amount of drainage from bulb 1 or bulb 2 and keep the totals separate. Your caregiver will want to know which tube is draining  more.   Compress the bulb by folding it in half.   Replace the stopper.   Check the tape that holds the tube to your skin, and pin the bulb to your shirt.  SEEK MEDICAL CARE IF:  The drainage develops a bad odor.   You have an oral temperature above 102 F (38.9 C).   The amount of drainage from your wound suddenly increases or decreases.   You accidentally pull out your drain.   You have any other questions or concerns.  MAKE SURE YOU:   Understand these instructions.   Will watch your condition.   Will get help right away if you are not doing well or get worse.     Call our office if you have any questions about your drain. [phone]  You will be on TPN which is supplement through a vein.  You may eat clear liquids, anything you can see through such as sprite, coke, coffee, jello.  You may NOT have milk, creamer, cream of wheat, cream of tomato and such consistency food.  Please call IMMEDIATELY if you have worsening abdominal pain, fever, chills or sweats.

## 2013-11-11 NOTE — Progress Notes (Signed)
PICC line capped off, flushed with 10cc NS, GBR followed by Heaparin 250 units/port. [employer]

## 2013-11-11 NOTE — Discharge Summary (Signed)
Physician Discharge Summary  Eduardo [name] [id number] DOB: [birth year] DOA: 10/27/2013  PCP: [name]., MD  Consultation: none  Admit date: 10/27/2013 Discharge date: 11/11/2013  Recommendations for Outpatient Follow-up:   Follow-up Information   Follow up with CORNETT,THOMAS A., MD In 2 weeks.   Specialty:  General Surgery   Contact information:   [address] [place] [address] [phone]       Follow up with [name]., MD. Call in 2 weeks.   Specialty:  Internal Medicine   Contact information:   [id number] [facility] [place] [address] [phone]      Discharge Diagnoses:  1. Enterocutaneous fistula 2. Anastomosis leak 3. Protein calorie malnutrition  4. Right hemicolectomy for invasive adenocarcinoma 5. Post operative abscesses s/p drain placement 10/28/13   Surgical Procedure: none  Discharge Condition: stable Disposition: home  Diet recommendation: TPN may have sips of clears  Filed Weights   10/27/13 1824 11/03/13 1247 11/09/13 1411  Weight: 224 lb (101.606 kg) 207 lb 3.7 oz (94 kg) 205 lb 1.6 oz (93.033 kg)     Filed Vitals:   11/11/13 0501  BP: 98/63  Pulse: 80  Temp: 98.5 F (36.9 C)  Resp: 18     Hospital Course:  68 year old white male who underwent a laparoscopic hand-assisted right hemicolectomy on 10/20/2013 for colon cancer at the hepatic flexure which was found to be invasive adenocarcinoma with lymph node involvement. He did fairly well after surgery and was having normal bowel function and eating until he [facility] 10[facility]27/2013 with generalized malaise. CT scan of the abdomen revealed an intra-abdominal abscess with extraluminal air within the peritoneal cavity. WBC normal. He was cared for by Dr. Arnoldo Morale. He underwent image guided percutaneous drainage of the abscess on 10/28/2013 at [facility]. He tolerated the procedure well. Feculent material was seen. Reportedly his drain  cleared up and amount improved. He had a followup CT scan of the abdomen done on 10/31/2013 which revealed a resolving intra-abdominal abscess with appropriate catheter placement, and no anastomotic leak. The patients diet was advanced as tolerated to fulls, but his drainage output increased and became more feculent and foul smelling with occasional air in the bag. Dr. Arnoldo Morale offered surgery, but the patient and wife were hesitant so they requested transfer to [facility] for further surgical management.  He was admitted and started on TPN, bowel rest and Invanz.  He had a repeat CT on 11/09/13 which showed ongoing leak, no fluid collection.  It was then decided to continue with TPN, drain and bowel rest.  His white count remained normal.  He was allowed sips of clears which he may continue.  His pain remained well controlled. He continued to have fecal output from the drain, this was flushed by nursing.    On HD #15 the patient had stable vital signs, pain well controlled, normal white count. He was therefore felt stable for discharge.  He will be going home with [facility] nursing for TPN, oral antibiotics for an additional 14 days.  He is to remain NPO x sips of clears only.  He is to follow up with Dr. Brantley Stage in 2 weeks.  We had multiple discussions regarding discharge planning.  He feels comfortable with drain flushes and emptying.  He knows when to call our clinic(fever, chills or worsening pain.   Physical Exam: General appearance: alert, cooperative and no distress  Resp: clear to auscultation bilaterally  Cardio: regular rate and  rhythm, S1, S2 normal, no murmur, click, rub or gallop  GI: soft, non-tender; bowel sounds normal; no masses, no organomegaly. Healing midline incision. Rt abdomen drain with stool.     Discharge Instructions     Medication List    STOP taking these medications       AMITIZA 24 MCG capsule  Generic drug:  lubiprostone     HYDROcodone-acetaminophen 5-325 MG per tablet   Commonly known as:  NORCO/VICODIN     oxyCODONE-acetaminophen 7.5-325 MG per tablet  Commonly known as:  PERCOCET  Replaced by:  oxyCODONE-acetaminophen 5-325 MG per tablet      TAKE these medications       benzonatate 100 MG capsule  Commonly known as:  TESSALON  Take 100 mg by mouth 2 (two) times daily as needed. For cough     ciprofloxacin 500 MG tablet  Commonly known as:  CIPRO  Take 1 tablet (500 mg total) by mouth 2 (two) times daily.     fluticasone 50 MCG/ACT nasal spray  Commonly known as:  FLONASE  Place 2 sprays into both nostrils daily.     metroNIDAZOLE 500 MG tablet  Commonly known as:  FLAGYL  Take 1 tablet (500 mg total) by mouth 3 (three) times daily.     omeprazole 20 MG capsule  Commonly known as:  PRILOSEC  Take 20 mg by mouth every morning.     ondansetron 4 MG disintegrating tablet  Commonly known as:  ZOFRAN-ODT  Take 4 mg by mouth 4 (four) times daily as needed. For nausea/vomiting     oxyCODONE-acetaminophen 5-325 MG per tablet  Commonly known as:  PERCOCET/ROXICET  Take 1-2 tablets by mouth every 4 (four) hours as needed for severe pain.     pregabalin 75 MG capsule  Commonly known as:  LYRICA  Take 75 mg by mouth every evening.     rizatriptan 10 MG tablet  Commonly known as:  MAXALT  Take 10 mg by mouth as needed for migraine. May repeat in 2 hours if needed     VENTOLIN HFA 108 (90 BASE) MCG/ACT inhaler  Generic drug:  albuterol  Inhale 1-2 puffs into the lungs every 6 (six) hours as needed. For shortness of breath/wheezing      ASK your doctor about these medications       ranitidine 300 MG tablet  Commonly known as:  ZANTAC  Take 300 mg by mouth at bedtime.           Follow-up Information   Follow up with Turner Daniels., MD.   Specialty:  General Surgery   Contact information:   [address] [place] [address] [phone]       Follow up with [name]., MD. Call in 2 weeks.   Specialty:   Internal Medicine   Contact information:   [id number] [place] [address] [phone]        The results of significant diagnostics from this hospitalization (including imaging, microbiology, ancillary and laboratory) are listed below for reference.    Significant Diagnostic Studies: Chest 2 View  10/19/2013   CLINICAL DATA:  Preop for partial colon resection secondary to cancer.  EXAM: CHEST  2 VIEW  COMPARISON:  03/23/2013  FINDINGS: Midline trachea. Borderline cardiomegaly. Mediastinal contours otherwise within normal limits. No pleural effusion or pneumothorax. Clear lungs.  IMPRESSION: No acute cardiopulmonary disease.   Electronically Signed   By: Abigail Miyamoto M.D.   On:  10/19/2013 14:42   Ct Abdomen Pelvis W Contrast  11/09/2013   CLINICAL DATA:  Status post right lower quadrant quadrant abscess drainage felt secondary to an anastomotic colonic leak.  EXAM: CT ABDOMEN AND PELVIS WITH CONTRAST  TECHNIQUE: Multidetector CT imaging of the abdomen and pelvis was performed using the standard protocol following bolus administration of intravenous contrast.  CONTRAST:  131mL OMNIPAQUE IOHEXOL 300 MG/ML SOLN, the patient also received oral contrast material.  COMPARISON:  CT scan of the abdomen pelvis dated October 31, 2013.  FINDINGS: In the right mid abdomen there is a drainage catheter in place lying adjacent to partially distended small bowel and right colonic loops at the level of the previous ileocolonic anastomosis. There is a small amount of extraluminal contrast present surrounding the loop of the drainage catheter on images 46 through 58 of series 2 and very small amount of extraluminal gas is present on images 47 through 49 of series 2. A small amount of extraluminal gas is also visible adjacent to the liver under the right hemidiaphragm. There remain increased densities in the mesenteric fat surrounding the anastomosis and drainage catheter. There is no  evidence of a small or large bowel obstruction. No no free fluid or contrast in the pelvis is demonstrated.  The liver exhibits no focal mass nor ductal dilation. The gallbladder is mildly distended and exhibits no evidence of stones. The spleen, partially distended stomach, pancreas, adrenal glands, and kidneys are normal in appearance. The caliber of the abdominal aorta is normal. The psoas musculature is normal in appearance. There are postsurgical changes in the midline of the anterior abdominal wall. There is no evidence of an abscess. A tiny amount of gas is again demonstrated along the inner aspect of the incision site however.  Within the pelvis the partially distended urinary bladder is normal in appearance. The prostate gland produces a mild impression upon the bladder base. The rectum is mildly distended with fluid and contrast and stool. The patient has undergone previous inguinal hernia repairs bilaterally.  The lung bases exhibit tip minimal compressive atelectasis in the posterior costophrenic gutters. There is no significant pleural effusion. There is a bullous lesion medially in the right lower lobe. The lumbar vertebral bodies are preserved in height. There are degenerative disc changes at L5-S1. The bony pelvis exhibits no acute abnormality.  IMPRESSION: 1. There remains a small amount of extraluminal gas and contrast adjacent to the drainage catheter worrisome for an ongoing leak from the previous ileocolonic anastomosis. A small amount of gas is present in the subdiaphragmatic region on the right. There is no large fluid collection demonstrated. There is no small or large bowel obstruction. 2. There is no acute abnormality demonstrated elsewhere within the abdomen or pelvis.   Electronically Signed   By: David  Martinique   On: 11/09/2013 09:30   Ct Abdomen Pelvis W Contrast  10/31/2013   CLINICAL DATA:  Colon cancer status post right hemicolectomy with ileocolonic anastomosis. Abdominal fluid  collection or worrisome for anastomotic leak/breakdown, status post percutaneous drainage.  EXAM: CT ABDOMEN AND PELVIS WITH CONTRAST  TECHNIQUE: Multidetector CT imaging of the abdomen and pelvis was performed using the standard protocol following bolus administration of intravenous contrast.  CONTRAST:  68mL OMNIPAQUE IOHEXOL 300 MG/ML SOLN, 163mL OMNIPAQUE IOHEXOL 300 MG/ML SOLN  COMPARISON:  10/27/2013  FINDINGS: Trace bilateral pleural effusions with associated lower lobe opacities, likely atelectasis.  10 mm probable cyst in the lateral segment left hepatic lobe (series 2/ image 17).  Spleen, pancreas, and adrenal glands are within normal limits.  Gallbladder is unremarkable. No intrahepatic or extrahepatic ductal dilatation.  Kidneys are within normal limits.  No hydronephrosis.  Status post right hemicolectomy with ileocolonic anastomosis. Mildly prominent loops of small bowel in the left upper abdomen, without focal transition, grossly unchanged and possibly reflecting adynamic ileus. Fluid-filled loops of distal colon.  4.2 x 6.7 x 6.5 cm fluid and gas collection in the right mid abdomen, near the suture line, previously 8.7 x 9.4 x 10.5 cm. Indwelling pigtail drainage catheter along the superior margin of the collection (series 2/image 49). Small volume pneumoperitoneum, most prominently along the anterior liver dome (series 2/image 19), stable versus mildly decreased.  Atherosclerotic calcifications of the abdominal aorta and branch vessels.  No abdominopelvic ascites.  No suspicious abdominopelvic lymphadenopathy.  Prostate is unremarkable.  Bladder is notable for a tiny focus of nondependent gas, likely related to prior instrumentation, and is otherwise within normal limits.  Postsurgical changes in the anterior abdominal wall.  Mild degenerative changes of the lower thoracic spine and L5-S1. Sequela of prior AVN involving the bilateral femoral heads.  IMPRESSION: Status post right hemicolectomy with  ileocolonic anastomosis.  6.7 cm fluid and gas collection in the right mid abdomen near the suture line, with interval placement of an indwelling pigtail drainage catheter, decreased.  Small volume pneumoperitoneum, stable versus mildly decreased.  Suspected small bowel ileus, grossly unchanged.   Electronically Signed   By: Julian Hy M.D.   On: 10/31/2013 11:57   Ct Abdomen Pelvis W Contrast  10/27/2013   CLINICAL DATA:  Abdominal pain. History of colon cancer status post resection 10/20/2013  EXAM: CT ABDOMEN AND PELVIS WITH CONTRAST  TECHNIQUE: Multidetector CT imaging of the abdomen and pelvis was performed using the standard protocol following bolus administration of intravenous contrast.  CONTRAST:  2mL OMNIPAQUE IOHEXOL 300 MG/ML SOLN, 123mL OMNIPAQUE IOHEXOL 300 MG/ML SOLN  COMPARISON:  10/11/2013  FINDINGS: BODY WALL: Recent laparotomy changes  LOWER CHEST: There are small bilateral pleural effusions and basilar opacities with volume loss, most compatible with atelectasis. High-density material within the posterior costophrenic sulcus on the left, favored to be pre-existing mineralization, possibly more prominent in the setting of collapse. There were a few calcifications noted on the previous exam.  ABDOMEN/PELVIS:  Liver: 13 mm low-attenuation abnormality upper left lobe is stable from prior, likely cyst rather than metastatic deposit.  Biliary: No evidence of biliary obstruction or stone.  Pancreas: Unremarkable.  Spleen: Unremarkable.  Adrenals: Unremarkable.  Kidneys and ureters: 1 cm low dense lesion the upper pole right kidney is presumably a cyst, although small size limits certainty.  Bladder: Unremarkable.  Reproductive: Unremarkable.  Bowel: Interval right hemicolectomy with ileocolonic anastomosis. At the anastomosis, there is a gas and fluid collection measuring 10 cm. Pneumoperitoneum is present, moderate volume. Small volume ascites, likely from peritonitis. Diffuse dilation of  small and large bowel consistent with ileus.  Retroperitoneum: No mass or adenopathy.  Vascular: No acute abnormality.  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 Sclerosis in the anterior right femoral head neck junction is possibly a bone island, stable.  Critical Value/emergent results were called by telephone at the time of interpretation on 10/27/2013 at 9:45 PM to Dr. Pattricia Boss , who verbally acknowledged these results.  IMPRESSION: 1. Enterocolonic anastomotic breakdown with 10 cm abscess and pneumoperitoneum. 2. Reactive ileus. 3. Bibasilar atelectasis with small effusions.   Electronically Signed   By: Gilford Silvius.D.  On: 10/27/2013 21:47   Dg Chest Port 1 View  11/08/2013   CLINICAL DATA:  Cough  EXAM: PORTABLE CHEST - 1 VIEW  COMPARISON:  10/19/2013  FINDINGS: Left arm PICC extends to the cavoatrial junction. New platelike atelectasis or scarring in the right lower lung. Left lung clear. . No effusion. Visualized skeletal structures are unremarkable.  IMPRESSION: 1. PICC line to the cavoatrial junction. 2. Right lower lung linear atelectasis or scarring.   Electronically Signed   By: Arne Cleveland M.D.   On: 11/08/2013 13:52   Ct Image Guided Drainage By Percutaneous Catheter  10/28/2013   CLINICAL DATA:  68 year old male with a history of colon cancer status post right hemicolectomy with ileocolonic anastomosis. His postoperative course is complicated by a perianastamotic fluid and gas collection concerning for anastomotic leak/breakdown. [facility] a CT-guided abscess drain.  EXAM: CT GUIDED DRAINAGE OF intra-abdominal ABSCESS  ANESTHESIA/SEDATION: 3 Mg IV Versed 50 mcg IV Fentanyl  Total Moderate Sedation Time:  18 minutes  PROCEDURE: The procedure, risks, benefits, and alternatives were explained to the patient. Questions regarding the procedure were encouraged and answered. The patient understands and  consents to the procedure.  The right hemi abdomen was prepped with Betadinein a sterile fashion, and a sterile drape was applied covering the operative field. A sterile gown and sterile gloves were used for the procedure. Local anesthesia was provided with 1% Lidocaine.  A planning axial CT scan was performed. The fluid and gas collection adjacent to the ileocolonic anastomosis was successfully identified. A suitable skin entry site was selected and marked. Local anesthesia was attained by infiltration with 1% lidocaine. A small dermatotomy was made. Using CT fluoroscopic guidance, an 18 gauge trocar needle was advanced through the right abdominal wall and into the fluid and gas collection. Feculent material was returned through the needle. A wire was then advanced into the collection and the tract serially dilated to 12 [place]. A Cook 12 [place] all-purpose drain was then advanced over the wire and formed within the collection. Approximately 100 mL of frankly feculent material was aspirated. A post drain axial CT scan demonstrates good positioning of the drainage catheter. The catheter was connected to gravity bag drainage (in an effort to reduce the risk of fistula formation from JP bulb suction) and secured to the skin with 0 Prolene suture. A sterile bandage was placed. Overall, the patient tolerated the procedure well.  COMPLICATIONS: None  IMPRESSION: Successful placement of a 12 French percutaneous drain in the perianastamotic fluid and gas collection yielding approximately 100 mL of frankly feculent material.  Signed,  Criselda Peaches, MD  [facility]  [facility]   Electronically Signed   By: Jacqulynn Cadet M.D.   On: 10/28/2013 14:00    Microbiology: Recent Results (from the past 240 hour(s))  SURGICAL PCR SCREEN     Status: None   Collection Time    11/02/13 10:35 AM      Result Value Range Status   MRSA, PCR NEGATIVE  NEGATIVE Final    Staphylococcus aureus NEGATIVE  NEGATIVE Final   Comment:            The Xpert SA Assay (FDA     approved for NASAL specimens     in patients over 41 years of age),     is one component of     a comprehensive surveillance     program.  Test performance has     been validated by [facility] for patients greater     than or equal to 9 year old.     It is not intended     to diagnose infection nor to     guide or monitor treatment.     Labs: Basic Metabolic Panel:  Recent Labs Lab 11/05/13 0615 11/06/13 0422 11/08/13 0555 11/11/13 0545  NA 132* 133* 136* 139  K 4.2 4.1 4.4 4.1  CL 95* 96 98 102  CO2 23 24 24 26   GLUCOSE 107* 117* 100* 114*  BUN 12 15 16 17   CREATININE 0.75 0.74 0.72 0.65  CALCIUM 9.0 8.7 9.0 8.3*  MG 2.2 2.1 2.2 2.1  PHOS 3.5 3.7 3.7 3.5   Liver Function Tests:  Recent Labs Lab 11/08/13 0555 11/11/13 0545  AST 30 31  ALT 42 55*  ALKPHOS 84 82  BILITOT 0.3 0.2*  PROT 7.7 6.9  ALBUMIN 2.7* 2.5*   No results found for this basename: LIPASE, AMYLASE,  in the last 168 hours No results found for this basename: AMMONIA,  in the last 168 hours CBC:  Recent Labs Lab 11/06/13 0422 11/08/13 0555 11/11/13 0545  WBC 8.6 6.6 4.4  NEUTROABS  --  4.2  --   HGB 11.2* 11.1* 10.6*  HCT 33.5* 33.5* 32.0*  MCV 87.7 86.8 87.2  PLT 488* 471* 274   Cardiac Enzymes: No results found for this basename: CKTOTAL, CKMB, CKMBINDEX, TROPONINI,  in the last 168 hours BNP: BNP (last 3 results) No results found for this basename: PROBNP,  in the last 8760 hours CBG:  Recent Labs Lab 11/10/13 1630 11/10/13 1941 11/10/13 2340 11/11/13 0406 11/11/13 0737  GLUCAP 82 123* 127* 136* 111*    Active Problems:   Abscess of abdominal cavity   Protein-calorie malnutrition, severe   Time coordinating discharge: <30 mins  Signed:  Tremond Shimabukuro, ANP-BC

## 2013-11-11 NOTE — Progress Notes (Signed)
  Subjective: abd abscess drain placed 1/8 CT 1/20- see report Pt feeling some better Not eating yet, on TPA. Up walking halls, reports he is for discharge today.   Objective: Vital signs in last 24 hours: Temp:  [97.5 F (36.4 C)-98.5 F (36.9 C)] 98.5 F (36.9 C) (01/22 0501) Pulse Rate:  [80-82] 80 (01/22 0501) Resp:  [18-20] 18 (01/22 0501) BP: (98-123)/(63-74) 98/63 mmHg (01/22 0501) SpO2:  [95 %-100 %] 95 % (01/22 0501) Last BM Date: 11/10/13  Intake/Output from previous day: 01/21 0701 - 01/22 0700 In: 3485.6 [I.V.:489; JOA:4166.0] Out: 47 [Urine:2; Drains:45] Intake/Output this shift:    PE:  Site of drain NT; no bleeding Clean and dry Output feculent; air   Lab Results:   Recent Labs  11/11/13 0545  WBC 4.4  HGB 10.6*  HCT 32.0*  PLT 274   BMET  Recent Labs  11/11/13 0545  NA 139  K 4.1  CL 102  CO2 26  GLUCOSE 114*  BUN 17  CREATININE 0.65  CALCIUM 8.3*    Anti-infectives: Anti-infectives   Start     Dose/Rate Route Frequency Ordered Stop   11/11/13 0000  ciprofloxacin (CIPRO) 500 MG tablet     500 mg Oral 2 times daily 11/11/13 0810     11/11/13 0000  metroNIDAZOLE (FLAGYL) 500 MG tablet     500 mg Oral 3 times daily 11/11/13 0810 11/25/13 2359   11/03/13 1600  ertapenem (INVANZ) 1 g in sodium chloride 0.9 % 50 mL IVPB     1 g 100 mL/hr over 30 Minutes Intravenous Every 24 hours 11/03/13 1454     10/28/13 0600  piperacillin-tazobactam (ZOSYN) IVPB 3.375 g  Status:  Discontinued     3.375 g 12.5 mL/hr over 240 Minutes Intravenous 3 times per day 10/27/13 2340 11/03/13 1454   10/28/13 0300  fluconazole (DIFLUCAN) IVPB 200 mg  Status:  Discontinued     200 mg 100 mL/hr over 60 Minutes Intravenous Every 24 hours 10/28/13 0217 11/03/13 1507   10/28/13 0000  fluconazole (DIFLUCAN) IVPB 200 mg  Status:  Discontinued     200 mg 100 mL/hr over 60 Minutes Intravenous Every 24 hours 10/27/13 2340 10/28/13 0217   10/27/13 2200   piperacillin-tazobactam (ZOSYN) IVPB 3.375 g     3.375 g 12.5 mL/hr over 240 Minutes Intravenous  Once 10/27/13 2147 10/28/13 0307      Assessment/Plan: s/p Procedure(s): EXPLORATORY LAPAROTOMY (N/A)  abd abscess drain intact Continue drain, HH to flush daily or teach pt. Likely fistula- output feculent Will need inj before removal Plan per [facility] Probable DC today.    LOS: 15 days    Ascencion Dike 11/11/2013

## 2013-11-11 NOTE — Discharge Summary (Signed)
I have seen and examined the patient and agree with the assessment and plans.  Josefine Fuhr A. Camren Henthorn  MD, FACS  

## 2013-11-22 ENCOUNTER — Telehealth (INDEPENDENT_AMBULATORY_CARE_PROVIDER_SITE_OTHER): Payer: Self-pay

## 2013-11-22 NOTE — Telephone Encounter (Signed)
Mandy/ADV states patient is having bulging at [place] drain with pain ,and he stopped his Flagyl because it was causing stomach pain. Please advise  Mandy # [id number]  P/O appt 11-26-13 1050am

## 2013-11-23 ENCOUNTER — Other Ambulatory Visit (INDEPENDENT_AMBULATORY_CARE_PROVIDER_SITE_OTHER): Payer: Self-pay

## 2013-11-23 DIAGNOSIS — G8918 Other acute postprocedural pain: Secondary | ICD-10-CM

## 2013-11-23 NOTE — Telephone Encounter (Signed)
Needs to be seen by IR to make sure drain  working

## 2013-11-23 NOTE — Telephone Encounter (Signed)
Called pt and let him know we are sending him to IR to check drain. He will call back and speak with Stevie to schedule this during a time he has a ride.

## 2013-11-24 ENCOUNTER — Telehealth (INDEPENDENT_AMBULATORY_CARE_PROVIDER_SITE_OTHER): Payer: Self-pay | Admitting: *Deleted

## 2013-11-24 ENCOUNTER — Other Ambulatory Visit (INDEPENDENT_AMBULATORY_CARE_PROVIDER_SITE_OTHER): Payer: Self-pay | Admitting: Surgery

## 2013-11-24 ENCOUNTER — Ambulatory Visit (HOSPITAL_COMMUNITY)
Admission: RE | Admit: 2013-11-24 | Discharge: 2013-11-24 | Disposition: A | Discharge status: Home / Self Care | Payer: Managed Care, Other (non HMO) | Source: Ambulatory Visit | Attending: Surgery | Admitting: Surgery

## 2013-11-24 DIAGNOSIS — Z4682 Encounter for fitting and adjustment of non-vascular catheter: Secondary | ICD-10-CM | POA: Insufficient documentation

## 2013-11-24 DIAGNOSIS — G8918 Other acute postprocedural pain: Secondary | ICD-10-CM

## 2013-11-24 MED ORDER — IOHEXOL 300 MG/ML  SOLN
50.0000 mL | Freq: Once | INTRAMUSCULAR | Status: AC | PRN
  Administered 2013-11-24: 1 mL

## 2013-11-24 NOTE — Telephone Encounter (Signed)
I spoke with pts wife and informed her that per IR at [facility] the patient can go ahead and come on over and they will slide him in.  I informed pts wife to go to the 1st floor radiology department at [facility].  She is agreeable with this at this time.

## 2013-11-26 ENCOUNTER — Encounter (INDEPENDENT_AMBULATORY_CARE_PROVIDER_SITE_OTHER): Payer: Self-pay | Admitting: Surgery

## 2013-11-26 ENCOUNTER — Ambulatory Visit (INDEPENDENT_AMBULATORY_CARE_PROVIDER_SITE_OTHER): Payer: Managed Care, Other (non HMO) | Admitting: Surgery

## 2013-11-26 ENCOUNTER — Encounter (INDEPENDENT_AMBULATORY_CARE_PROVIDER_SITE_OTHER): Payer: Self-pay

## 2013-11-26 VITALS — BP 132/78 | HR 76 | Temp 97.0°F | Resp 18 | Ht 71.0 in | Wt 200.0 lb

## 2013-11-26 DIAGNOSIS — K632 Fistula of intestine: Secondary | ICD-10-CM

## 2013-11-26 MED ORDER — OXYCODONE-ACETAMINOPHEN 5-325 MG PO TABS: 1.0000 | ORAL_TABLET | ORAL | Status: DC | PRN

## 2013-11-26 NOTE — Patient Instructions (Signed)
RETURN TWO WEEKS.  NO OTHER CHANGES.

## 2013-11-26 NOTE — Progress Notes (Signed)
Subjective:     Patient ID: Eduardo [name], male   DOB: 19-Aug-1946, 68 y.o.   MRN: [id number]  HPI  Patient returns for followup of his enterocutaneous fistula 5 weeks after elective right hemicolectomy by Dr. Aviva Signs at [facility].  He was transferred after he developed an abscess 1 week after surgery. Dr. Arnoldo Morale wish to explore the patient at that time the patient and family wish to be transferred. I was contacted Arnoldo Morale and except the patient. Per drain showed bilious material consistent with small bowel contents. He was placed n.p.o., antibiotics and TNA was started. He is being discharged on nighttime TNA. Patient underwent drain study this week which showed small communicating fistula. He is very tired and weak but otherwise doing okay. Drain output is about 30 cc a day. If he takes more and by mouth he does go up we'll more than that he states. Denies fever or chills. No bowel pain. Review of Systems  Respiratory: Negative.   Cardiovascular: Negative.   Gastrointestinal: Negative.        Objective:   Physical Exam  Constitutional: He appears lethargic. He has a sickly appearance.  Eyes: No scleral icterus.  Neck: Normal range of motion.  Cardiovascular: Normal rate and regular rhythm.   Pulmonary/Chest: Effort normal and breath sounds normal.  Abdominal: There is no tenderness.    Neurological: He appears lethargic.  Skin: Skin is warm and dry.       Assessment:     Enterocutaneous fistula now 5 weeks out from right hemicolectomy for colon cancer stage III  Protein calorie malnutrition severe  Fatigue    Plan:     Check prealbumin. Continue drainage for now. Continue nighttime parenteral nutrition. Patient to take small amounts of oral liquids. Return in 2 weeks for reevaluation. If fistula is not close within the next 6 weeks or so may require expiration and takedown. It would be prudent to maximize his physical condition prior to any surgery since in his current state  complication rates high. Patient understands.

## 2013-11-29 ENCOUNTER — Telehealth (INDEPENDENT_AMBULATORY_CARE_PROVIDER_SITE_OTHER): Payer: Self-pay

## 2013-11-29 ENCOUNTER — Inpatient Hospital Stay (HOSPITAL_COMMUNITY)
Admission: RE | Admit: 2013-11-29 | Discharge: 2013-11-29 | Disposition: A | Discharge status: Home / Self Care | Payer: Managed Care, Other (non HMO) | Source: Ambulatory Visit | Attending: Surgery | Admitting: Surgery

## 2013-11-29 ENCOUNTER — Other Ambulatory Visit (INDEPENDENT_AMBULATORY_CARE_PROVIDER_SITE_OTHER): Payer: Self-pay | Admitting: Surgery

## 2013-11-29 DIAGNOSIS — Z95828 Presence of other vascular implants and grafts: Secondary | ICD-10-CM

## 2013-11-29 DIAGNOSIS — B999 Unspecified infectious disease: Secondary | ICD-10-CM

## 2013-11-29 NOTE — Telephone Encounter (Signed)
Rec'd call from [facility] regarding Mr. Paskett PICC Line coming out.  Leafy Ro reports that the PICC line has come out about 30 cm's.  Reviewed with Dr. Brantley Stage, patient will need to have PICC Line replaced.  Order entered in [facility], signed and faxed to IR @ [phone].  Patient notified, placement to be @ [facility] IR

## 2013-11-30 ENCOUNTER — Ambulatory Visit (HOSPITAL_COMMUNITY)
Admission: RE | Admit: 2013-11-30 | Discharge: 2013-11-30 | Disposition: A | Discharge status: Home / Self Care | Payer: Managed Care, Other (non HMO) | Source: Ambulatory Visit | Attending: Surgery | Admitting: Surgery

## 2013-11-30 ENCOUNTER — Other Ambulatory Visit (INDEPENDENT_AMBULATORY_CARE_PROVIDER_SITE_OTHER): Payer: Self-pay | Admitting: Surgery

## 2013-11-30 DIAGNOSIS — Z452 Encounter for adjustment and management of vascular access device: Secondary | ICD-10-CM | POA: Insufficient documentation

## 2013-11-30 DIAGNOSIS — Z95828 Presence of other vascular implants and grafts: Secondary | ICD-10-CM

## 2013-12-09 ENCOUNTER — Encounter (INDEPENDENT_AMBULATORY_CARE_PROVIDER_SITE_OTHER): Payer: Self-pay

## 2013-12-10 ENCOUNTER — Telehealth (INDEPENDENT_AMBULATORY_CARE_PROVIDER_SITE_OTHER): Payer: Self-pay

## 2013-12-10 ENCOUNTER — Encounter (INDEPENDENT_AMBULATORY_CARE_PROVIDER_SITE_OTHER): Payer: Self-pay | Admitting: Surgery

## 2013-12-10 ENCOUNTER — Ambulatory Visit (INDEPENDENT_AMBULATORY_CARE_PROVIDER_SITE_OTHER): Payer: Managed Care, Other (non HMO) | Admitting: Surgery

## 2013-12-10 VITALS — BP 106/70 | HR 60 | Temp 97.6°F | Resp 14 | Ht 74.0 in | Wt 202.0 lb

## 2013-12-10 DIAGNOSIS — K632 Fistula of intestine: Secondary | ICD-10-CM

## 2013-12-10 MED ORDER — OXYCODONE-ACETAMINOPHEN 5-325 MG PO TABS: 1.0000 | ORAL_TABLET | ORAL | Status: DC | PRN

## 2013-12-10 NOTE — Telephone Encounter (Signed)
Called and spoke to patient to give appointment date & time for CT Scan.  Patient to have CT and Chest X-ray on 12/14/13 arrive 3:00pm.  Patient has contrast and instructions given.

## 2013-12-10 NOTE — Patient Instructions (Signed)
TRY ENSURE 1 CAN BY MOUTH 1 - 3 TIMES A DAY.  WRITE DOWN THE AMOUNT YOU DRINK AND AMOUNT IN DRAIN.  WILL SET UP ct AND cxr.  Return in 10 -14 days.

## 2013-12-10 NOTE — Progress Notes (Signed)
Subjective:     Patient ID: Eduardo [name], male   DOB: 03-07-1946, 68 y.o.   MRN: [id number]  HPI  Patient returns for followup of his enterocutaneous fistula 7  weeks after elective right hemicolectomy by Dr. Aviva Signs at [facility].  He was transferred after he developed an abscess 1 week after surgery. Dr. Arnoldo Morale wish to explore the patient at that time the patient and family wish to be transferred. I was contacted Arnoldo Morale and except the patient. Per drain showed bilious material consistent with small bowel contents. He was placed n.p.o., antibiotics and TNA was started. He is being discharged on nighttime TNA. Patient underwent drain study this week which showed small communicating fistula. He is very tired and weak but otherwise doing okay. Drain output is about 30 cc a day. If he takes more and by mouth he does go up we'll more than that he states. Denies fever or chills. No bowel pain.still feels very tired. The catheter is hurting him under his rib cage. He feels short of breath at times without chest pain. His vital signs today are normal low. Review of Systems  Respiratory: Negative.   Cardiovascular: Negative.   Gastrointestinal: Negative.        Objective:   Physical Exam  Constitutional: He appears lethargic. He has a sickly appearance.  Eyes: No scleral icterus.  Neck: Normal range of motion.  Cardiovascular: Normal rate and regular rhythm.   Pulmonary/Chest: Effort normal and breath sounds normal.  Abdominal: There is no tenderness.    Neurological: He appears lethargic.  Skin: Skin is warm and dry.       Assessment:     Enterocutaneous fistula now 7 weeks out from right hemicolectomy for colon cancer stage III  Protein calorie malnutrition moderate  Fatigue    Plan:    patient's albumin is now 3.1. He's putting out 30 cc a day but is not quantitating how much he takes them by mouth. I've asked him to try Ensure one can by mouth 3 times a day. I've asked him to write  down how much he takes them by mouth saline at a better estimate of how much his fistulas really putting out. He is still on nighttime TNA. We'll arrange for CT scan of abdomen and pelvis with rectal contrast to reassess his anatomy. We'll order chest x-ray cc having some subjective shortness of breath. Part of this may be from the catheter under rib cage. The site looks clean and intact without swelling. Return to clinic in 10-14 days for reassessment. Discussed operative intervention with he and his wife today and the pros and cons of this.

## 2013-12-14 ENCOUNTER — Ambulatory Visit
Admission: RE | Admit: 2013-12-14 | Discharge: 2013-12-14 | Disposition: A | Discharge status: Home / Self Care | Payer: Managed Care, Other (non HMO) | Source: Ambulatory Visit | Attending: Surgery | Admitting: Surgery

## 2013-12-14 DIAGNOSIS — K632 Fistula of intestine: Secondary | ICD-10-CM

## 2013-12-14 MED ORDER — IOHEXOL 300 MG/ML  SOLN
125.0000 mL | Freq: Once | INTRAMUSCULAR | Status: AC | PRN
  Administered 2013-12-14: 125 mL via INTRAVENOUS

## 2013-12-17 ENCOUNTER — Encounter (INDEPENDENT_AMBULATORY_CARE_PROVIDER_SITE_OTHER): Payer: Self-pay | Admitting: Surgery

## 2013-12-17 ENCOUNTER — Encounter (INDEPENDENT_AMBULATORY_CARE_PROVIDER_SITE_OTHER): Payer: Self-pay

## 2013-12-20 ENCOUNTER — Ambulatory Visit (INDEPENDENT_AMBULATORY_CARE_PROVIDER_SITE_OTHER): Payer: Managed Care, Other (non HMO) | Admitting: Surgery

## 2013-12-20 ENCOUNTER — Encounter (INDEPENDENT_AMBULATORY_CARE_PROVIDER_SITE_OTHER): Payer: Self-pay | Admitting: Surgery

## 2013-12-20 VITALS — BP 122/80 | HR 74 | Temp 98.0°F | Wt 202.0 lb

## 2013-12-20 DIAGNOSIS — K632 Fistula of intestine: Secondary | ICD-10-CM

## 2013-12-20 NOTE — Progress Notes (Signed)
Subjective:     Patient ID: Eduardo [name], male   DOB: 02-16-1946, 68 y.o.   MRN: [id number]  HPI  Patient returns for followup of his enterocutaneous fistula 7  weeks after elective right hemicolectomy by Dr. Aviva Signs at [facility].  He was transferred after he developed an abscess 1 week after surgery. Dr. Arnoldo Morale wish to explore the patient at that time the patient and family wish to be transferred. I was contacted Arnoldo Morale and except the patient. Per drain showed bilious material consistent with small bowel contents. He was placed n.p.o., antibiotics and TNA was started. He is being discharged on nighttime TNA. Patient underwent drain study this week which showed small communicating fistula. He is very tired and weak but otherwise doing okay. Drain output is about 30  75 cc cc a day. If he takes more and by mouth he does go up we'll more than that he states. Denies fever or chills. No bowel pain.still feels very tired. The catheter is hurting him under his rib cage. He feels short of breath at times without chest pain. His vital signs today are normal low. Review of Systems  Respiratory: Negative.   Cardiovascular: Negative.   Gastrointestinal: Negative.        Objective:   Physical Exam  Constitutional: He appears lethargic. He has a sickly appearance.  Eyes: No scleral icterus.  Neck: Normal range of motion.  Cardiovascular: Normal rate and regular rhythm.   Pulmonary/Chest: Effort normal and breath sounds normal.  Abdominal: There is no tenderness.    Neurological: He appears lethargic.  Skin: Skin is warm and dry.  CLINICAL DATA: 68 year old male with history of fistula following  percutaneous abscess drain which developed post partial colectomy  for colon cancer. Subsequent encounter.  EXAM:  CT ABDOMEN AND PELVIS WITH CONTRAST  TECHNIQUE:  Multidetector CT imaging of the abdomen and pelvis was performed  using the standard protocol following bolus administration of  intravenous  contrast.  CONTRAST: 125 mL Omnipaque 300  1.2 L water-soluble per-rectum contrast also administered.  COMPARISON: 10/2013 and earlier.  FINDINGS:  Improved ventilation at both lung bases. No pericardial or pleural  effusion. Residual scarring or atelectasis. No pulmonary nodule or  mass identified. Negative visible mediastinal structures.  Stable visualized osseous structures.  Rectal balloon catheter in place. Large bowel contrast was  administered in a retrograde fashion. Could colonic distention.  Redundant sigmoid colon. Sequelae of bilateral inguinal/ lower  abdominal hernia repair. Negative left colon. Negative proximal  transverse colon. Negative Mid transverse colon.  At the right hepatic flexure there is a small bowel to large bowel  anastomosis with an adjacent percutaneous pigtail drainage catheter.  At the time of the initial images contrast head traversed this  anastomosis in a retrograde fashion, opacifying multiple nondilated  distal small bowel loops (series 2, image 64). There is no overt  leakage of contrast from the anastomosis, but there is a min amount  of what appears to be extraluminal contrast adjacent to the pigtail  drain (series 2, image 55), in an area of irregular soft tissue  probable granulation. However, this appearance has improved compared  to 10/2013 at which time a larger volume of contrast was evident at  the drain (series 2, image 49 on that study).  No dilated small bowel loops. No per oral contrast administered.  Largely decompressed stomach and duodenum. No abdominal free fluid.  No pneumoperitoneum.  Stable liver, gallbladder, spleen, pancreas, adrenal glands, and  kidneys. Portal  venous system within normal limits. Major arterial  structures in the abdomen and pelvis are patent. Mild Aortoiliac  calcified atherosclerosis noted. No lymphadenopathy identified.  Chronic ventral midline abdomen incision unchanged. No acute  inflammation.    IMPRESSION:  1. Persistent small fistula connection between the during cavity and  the anastomosis, with trace rectally administered contrast  collecting adjacent to the pigtail drain (series 2, image 55), but  this volume of abnormal contrast appears decreased compared to  11/09/13 (compare with series 2, image 49 on that study).  2. The retrograde contrast did pass through the anastomosis into  nondilated distal small bowel during this exam.  3. Otherwise stable abdomen and pelvis. No metastatic disease  identified.  Electronically Signed  By: Lars Pinks M.D. CLINICAL DATA: 68 year old male shortness of breath since partial  colectomy 1 month ago. Initial encounter. Colon cancer.  Enterocutaneous fistula.  EXAM:  CHEST 2 VIEW  COMPARISON: Chest radiographs 10/1913 and earlier.  FINDINGS:  Right PICC line now in place. Stable lung volumes. Stable mild to  moderate elevation of the right hemidiaphragm. Normal cardiac size  and mediastinal contours. Small calcified left hilar nodes suspected  and unchanged. No pneumothorax or pulmonary edema. No pleural  effusion or acute pulmonary opacity. Stable visualized osseous  structures. Small volume of oral contrast in nondilated colon in the  upper abdomen.  IMPRESSION:  No acute or metastatic cardiopulmonary abnormality.      Assessment:     Enterocutaneous fistula now 9 weeks out from right hemicolectomy for colon cancer stage III  Protein calorie malnutrition moderate       improved   Fatigue    Plan:    patient's albumin is now 3.1. He's putting out 30 cc - 75 cc a day but is not quantitating how much he takes them by mouth. I've asked him to try Ensure one can by mouth 3 times a day. I've asked him to write down how much he takes them by mouth saline at a better estimate of how much his fistulas really putting out. He is still on nighttime TNA.   Part of this may be from the catheter under rib cage. The site looks clean and  intact without swelling. Return to clinic in 21  days for reassessment. Discussed operative intervention with he and his wife today and the pros and cons of this.

## 2013-12-20 NOTE — Patient Instructions (Signed)
CT looks better.  Lab work  Looks good.  If no resolution in 1 month,  Will need surgery in April.

## 2013-12-23 ENCOUNTER — Encounter (INDEPENDENT_AMBULATORY_CARE_PROVIDER_SITE_OTHER): Payer: Self-pay

## 2014-01-04 ENCOUNTER — Encounter (INDEPENDENT_AMBULATORY_CARE_PROVIDER_SITE_OTHER): Payer: Self-pay

## 2014-01-10 ENCOUNTER — Encounter (INDEPENDENT_AMBULATORY_CARE_PROVIDER_SITE_OTHER): Payer: Self-pay

## 2014-01-10 ENCOUNTER — Encounter (INDEPENDENT_AMBULATORY_CARE_PROVIDER_SITE_OTHER): Payer: Self-pay | Admitting: Surgery

## 2014-01-10 ENCOUNTER — Ambulatory Visit (INDEPENDENT_AMBULATORY_CARE_PROVIDER_SITE_OTHER): Payer: Managed Care, Other (non HMO) | Admitting: Surgery

## 2014-01-10 VITALS — BP 122/76 | HR 84 | Temp 97.6°F | Resp 18 | Ht 74.0 in | Wt 216.6 lb

## 2014-01-10 DIAGNOSIS — K632 Fistula of intestine: Secondary | ICD-10-CM

## 2014-01-10 MED ORDER — OXYCODONE-ACETAMINOPHEN 5-325 MG PO TABS: 1.0000 | ORAL_TABLET | ORAL | Status: DC | PRN

## 2014-01-10 NOTE — Patient Instructions (Signed)
Open Colectomy An open colectomy is surgery to take out part or all of the large intestine (colon). This procedure is used to treat several conditions, including:  Inflammation and infection of the colon (diverticulitis).  Tumors or masses in the colon.  Inflammatory bowel disease, such as Crohn disease or ulcerative colitis. Colectomy is an option when symptoms cannot be controlled with medicines.  Bleeding from the colon that cannot be controlled by another method.  Blockage or obstruction of the colon. LET [facility] CARE PROVIDER KNOW ABOUT:  Any allergies you have.  All medicines you are taking, including vitamins, herbs, eye drops, creams, and over-the-counter medicines.  Previous problems you or members of your family have had with the use of anesthetics.  Any blood disorders you have.  Previous surgeries you have had.  Medical conditions you have. RISKS AND COMPLICATIONS  Generally, this is a safe procedure. However, as with any procedure, complications can occur. Possible complications include:  An infection developing in the area where the surgery was done.  Problems with the incisions, including:  Bleeding from an incision.  The wound reopening.  Tissues from inside the abdomen bulging through the incision (hernia).  Bleeding inside the abdomen.  Reopening of the colon where it was stitched or stapled together. This is a serious complication. Another procedure may be needed to fix the problem.  Damage to other organs in the abdomen.  A blood clot forming in a vein and traveling to the lungs.  Future blockage of the small intestine from scar tissue. Another surgery may be needed to repair this. BEFORE THE PROCEDURE  Various tests may be done before the procedure. These may include:  Blood tests.  A test to check the heart's rhythm (electrocardiography).  A CT scan to get pictures of your abdomen.  Ask your health care provider about changing or  stopping any regular medicines. You will need to stop taking aspirin and nonsteroidal anti-inflammatory drugs (NSAIDs) at least 5 days before the surgery. You will also need to stop taking any blood thinners and vitamin E.  You may be prescribed an oral bowel prep. This involves drinking a large amount of medicated liquid, starting the day before your surgery. The liquid will cause you to have multiple loose stools until your stool is almost clear or light green. This cleans out your colon in preparation for the surgery.  Do not eat or drink anything else once you have started the bowel prep, unless your health care provider tells you it is safe to do so.  You may also be given antibiotic pills to clean out your colon of bacteria. Be sure to follow the directions carefully and take the medicine at the correct time.  Make plans to have someone drive you home after your hospital stay. Also arrange for someone to help you with activities during your recovery. PROCEDURE This surgery can take 2 4 hours.  Small monitors will be put on your body. They are used to check your heart, blood pressure, and oxygen level.  An IV access tube will be put into one of your veins. Medicine will be able to flow directly into your body through this IV tube.  You might be given a medicine to help you relax (sedative).  You will be given a medicine to make you sleep through the procedure (general anesthetic). A breathing tube may be placed into your lungs during the procedure.  A thin, flexible tube (catheter) will be placed into your bladder to  collect urine.  A tube may be put in through your nose. It is called a nasogastric tube. It is used to remove stomach fluids after surgery until the intestines start working again.  Once you are asleep, the surgeon will make an incision in the abdomen.  Clamps or staples are put on both ends of the diseased part of the colon.  The part of the intestine between the  clamps or staples is removed.  If possible, the ends of the healthy colon that remain will be stitched or stapled together to allow your body to expel waste (stool).  Sometimes, the remaining colon cannot be stitched back together. If this is the case, a colostomy is needed. For a colostomy:  An opening (stoma) to the outside of your body is made through the abdomen.  The end of the colon is brought to the opening. It is stitched to the skin.  A bag is attached to the opening. Stool will drain into this bag. The bag is removable.  The colostomy can be temporary or permanent.  The incision from the colectomy will be closed with stitches or staples. AFTER THE PROCEDURE  You will stay in a recovery area until the anesthetic has worn off. Your blood pressure and pulse will be checked often. Then you will be taken to a hospital room.  You will continue to get fluids through the IV tube for a while. The IV tube will be taken out when the colon starts working again.  You will start on clear liquids and gradually go back to a normal diet.  You will be encouraged to cough and to take deep breaths to open your lungs and prevent pneumonia.  Some pain is normal after a colectomy. You will be given pain medicine as needed.  You will be urged to get up and start walking within a day.  If you had a colostomy, your health care provider will explain how it works and what you will need to do.  You will likely need to stay in the hospital for 3 7 days. Document Released: 08/04/2009 Document Revised: 07/28/2013 Document Reviewed: 05/19/2013 [facility] Patient Information 2014 [place].

## 2014-01-10 NOTE — Progress Notes (Signed)
Subjective:     Patient ID: Eduardo [name], male   DOB: 05-Nov-1945, 68 y.o.   MRN: [id number]  HPI  Patient returns for followup of his enterocutaneous fistula 7  weeks after elective right hemicolectomy by Dr. Aviva Signs at [facility].  He was transferred after he developed an abscess 1 week after surgery. Dr. Arnoldo Morale wish to explore the patient at that time the patient and family wish to be transferred. I was contacted Arnoldo Morale and except the patient. Per drain showed bilious material consistent with small bowel contents. He was placed n.p.o., antibiotics and TNA was started. He is being discharged on nighttime TNA. Patient underwent drain study this week which showed small communicating fistula. He is very tired and weak but otherwise doing okay. Drain output is about 30  75 cc cc a day. If he takes more and by mouth he does go up we'll more than that he states. Denies fever or chills. No bowel pain.still feels very tired. The catheter is hurting him under his rib cage. He feels short of breath at times without chest pain. His vital signs today are normal low. He feels a little better but output the same from [place].  Review of Systems  Respiratory: Negative.   Cardiovascular: Negative.   Gastrointestinal: Negative.        Objective:   Physical Exam  Constitutional: He appears lethargic. He has a sickly appearance.  Eyes: No scleral icterus.  Neck: Normal range of motion.  Cardiovascular: Normal rate and regular rhythm.   Pulmonary/Chest: Effort normal and breath sounds normal.  Abdominal: There is no tenderness.    Neurological: He appears lethargic.  Skin: Skin is warm and dry.  CLINICAL DATA: 68 year old male with history of fistula following  percutaneous abscess drain which developed post partial colectomy  for colon cancer. Subsequent encounter.  EXAM:  CT ABDOMEN AND PELVIS WITH CONTRAST  TECHNIQUE:  Multidetector CT imaging of the abdomen and pelvis was performed  using the standard  protocol following bolus administration of  intravenous contrast.  CONTRAST: 125 mL Omnipaque 300  1.2 L water-soluble per-rectum contrast also administered.  COMPARISON: 10/2013 and earlier.  FINDINGS:  Improved ventilation at both lung bases. No pericardial or pleural  effusion. Residual scarring or atelectasis. No pulmonary nodule or  mass identified. Negative visible mediastinal structures.  Stable visualized osseous structures.  Rectal balloon catheter in place. Large bowel contrast was  administered in a retrograde fashion. Could colonic distention.  Redundant sigmoid colon. Sequelae of bilateral inguinal/ lower  abdominal hernia repair. Negative left colon. Negative proximal  transverse colon. Negative Mid transverse colon.  At the right hepatic flexure there is a small bowel to large bowel  anastomosis with an adjacent percutaneous pigtail drainage catheter.  At the time of the initial images contrast head traversed this  anastomosis in a retrograde fashion, opacifying multiple nondilated  distal small bowel loops (series 2, image 64). There is no overt  leakage of contrast from the anastomosis, but there is a min amount  of what appears to be extraluminal contrast adjacent to the pigtail  drain (series 2, image 55), in an area of irregular soft tissue  probable granulation. However, this appearance has improved compared  to 10/2013 at which time a larger volume of contrast was evident at  the drain (series 2, image 49 on that study).  No dilated small bowel loops. No per oral contrast administered.  Largely decompressed stomach and duodenum. No abdominal free fluid.  No pneumoperitoneum.  Stable liver, gallbladder, spleen, pancreas, adrenal glands, and  kidneys. Portal venous system within normal limits. Major arterial  structures in the abdomen and pelvis are patent. Mild Aortoiliac  calcified atherosclerosis noted. No lymphadenopathy identified.  Chronic ventral midline  abdomen incision unchanged. No acute  inflammation.  IMPRESSION:  1. Persistent small fistula connection between the during cavity and  the anastomosis, with trace rectally administered contrast  collecting adjacent to the pigtail drain (series 2, image 55), but  this volume of abnormal contrast appears decreased compared to  11/09/13 (compare with series 2, image 49 on that study).  2. The retrograde contrast did pass through the anastomosis into  nondilated distal small bowel during this exam.  3. Otherwise stable abdomen and pelvis. No metastatic disease  identified.  Electronically Signed  By: Lars Pinks M.D. CLINICAL DATA: 68 year old male shortness of breath since partial  colectomy 1 month ago. Initial encounter. Colon cancer.  Enterocutaneous fistula.  EXAM:  CHEST 2 VIEW  COMPARISON: Chest radiographs 10/1913 and earlier.  FINDINGS:  Right PICC line now in place. Stable lung volumes. Stable mild to  moderate elevation of the right hemidiaphragm. Normal cardiac size  and mediastinal contours. Small calcified left hilar nodes suspected  and unchanged. No pneumothorax or pulmonary edema. No pleural  effusion or acute pulmonary opacity. Stable visualized osseous  structures. Small volume of oral contrast in nondilated colon in the  upper abdomen.  IMPRESSION:  No acute or metastatic cardiopulmonary abnormality.      Assessment:     Enterocutaneous fistula now 12 weeks out from right hemicolectomy for colon cancer stage III  Protein calorie malnutrition moderate       improved   Fatigue    Plan:    Pt has failed conservative means and will require resection of anastomosis.  The procedure was discussed with the patient.  partial colectomy and anstomosis  discussed with the patient as well as non operative treatments. The risks of operative management include bleeding,  Infection,  Leak of anastamosis,  Ostomy formation, open procedure,  Sepsis,  Abcess,  Hernia,  DVT,   Pulmonary complications,  Cardiovascular  complications,  Injury to ureter,  Bladder,kidney,and anesthesia risks,  And death. The patient understands.  Questions answered.   The success of the procedure is 50-100  % for treating the patients symptoms. They agree to proceed.

## 2014-01-13 ENCOUNTER — Encounter (INDEPENDENT_AMBULATORY_CARE_PROVIDER_SITE_OTHER): Payer: Self-pay

## 2014-01-21 ENCOUNTER — Encounter (HOSPITAL_COMMUNITY): Payer: Self-pay | Admitting: Pharmacy Technician

## 2014-01-24 ENCOUNTER — Telehealth (INDEPENDENT_AMBULATORY_CARE_PROVIDER_SITE_OTHER): Payer: Self-pay | Admitting: General Surgery

## 2014-01-24 ENCOUNTER — Encounter (HOSPITAL_COMMUNITY): Payer: Self-pay

## 2014-01-24 ENCOUNTER — Encounter (HOSPITAL_COMMUNITY)
Admission: RE | Admit: 2014-01-24 | Discharge: 2014-01-24 | Disposition: A | Discharge status: Home / Self Care | Payer: Managed Care, Other (non HMO) | Source: Ambulatory Visit | Attending: Surgery | Admitting: Surgery

## 2014-01-24 DIAGNOSIS — Z01818 Encounter for other preprocedural examination: Secondary | ICD-10-CM | POA: Insufficient documentation

## 2014-01-24 HISTORY — DX: Allergy status to unspecified drugs, medicaments and biological substances: Z88.9

## 2014-01-24 NOTE — Progress Notes (Signed)
Mr Eduardo [name] has a Right upper arm 2 lumen PICC which he recievesTNA 12 hours a night at home.  He is managed by [facility].  Mr. Tschantz also has a right abdomen drain. Mr Sacks had labs drawn in past 2 days.  I will request labs from Dr Eduardo [name] as labs do not show in EPIC.

## 2014-01-24 NOTE — Pre-Procedure Instructions (Signed)
Eduardo [name]  01/24/2014   Your procedure is scheduled on:  Wednesday, April 15.  Report to [facility], Main Entrance [name] "A" at 10:30AM.  Call this number if you have problems the morning of surgery: [phone]   Remember:   Do not eat food or drink liquids after midnight Tuesday, April 14.   Take these medicines the morning of surgery with A SIP OF WATER: omeprazole (PRILOSEC).              May use fluticasone (FLONASE).              Use VENTOLIN HFA inhaler if needed and bring it to the hospital with you.               Take if needed: oxyCODONE-acetaminophen (PERCOCET/ROXICET).              Stop taking Aspirin, Coumadin, Plavix, Effient and Herbal medications.  Do not take any NSAIDs ie: Ibuprofen,  Advil,Naproxen or any medication containing Aspirin.    Do not wear jewelry, make-up or nail polish.  Do not wear lotions, powders, or perfumes. You may wear deodorant             Men may shave face and neck.  Do not bring valuables to the hospital.  [facility] is not responsible for any belongings or valuables.               Contacts, dentures or bridgework may not be worn into surgery.  Leave suitcase in the car. After surgery it may be brought to your room.  For patients admitted to the hospital, discharge time is determined by your treatment team.               Patients discharged the day of surgery will not be allowed to drive home.  Name and phone number of your driver: -   Special Instructions: [facility], Main Entrance/ Entrance "A"   Please read over the following fact sheets that you were given: Pain Booklet, Coughing and Deep Breathing and Surgical Site Infection Prevention

## 2014-01-24 NOTE — Telephone Encounter (Signed)
Mandy called from [facility] called and stated that she went out to check on the patient and flash his pic line, and the patient told her that he fell 10 days ago out his back door and he just told someone today, so Leafy Ro called to let us know that the patient fell 10 days ago, she state that he is doing well , she told the patient if that happens again that he needs to call someone

## 2014-01-28 ENCOUNTER — Encounter (INDEPENDENT_AMBULATORY_CARE_PROVIDER_SITE_OTHER): Payer: Self-pay

## 2014-02-01 MED ORDER — DEXTROSE 5 % IV SOLN
2.0000 g | INTRAVENOUS | Status: AC
  Administered 2014-02-02: 2 g via INTRAVENOUS
  Filled 2014-02-01: qty 2

## 2014-02-01 MED ORDER — PEG 3350-KCL-NA BICARB-NACL 420 G PO SOLR
4000.0000 mL | Freq: Once | ORAL | Status: DC
  Filled 2014-02-01: qty 4000

## 2014-02-01 MED ORDER — NEOMYCIN SULFATE 500 MG PO TABS
1000.0000 mg | ORAL_TABLET | ORAL | Status: DC
Start: 2014-02-01 — End: 2014-02-02
  Filled 2014-02-01 (×2): qty 2

## 2014-02-01 MED ORDER — ERYTHROMYCIN BASE 250 MG PO TABS
1000.0000 mg | ORAL_TABLET | ORAL | Status: DC
  Filled 2014-02-01 (×2): qty 4

## 2014-02-02 ENCOUNTER — Inpatient Hospital Stay (HOSPITAL_COMMUNITY)
Admission: RE | Admit: 2014-02-02 | Discharge: 2014-02-10 | DRG: 909 | Disposition: A | Discharge status: Home / Self Care | Payer: Managed Care, Other (non HMO) | Source: Ambulatory Visit | Attending: Surgery | Admitting: Surgery

## 2014-02-02 ENCOUNTER — Ambulatory Visit (HOSPITAL_COMMUNITY): Payer: Managed Care, Other (non HMO) | Admitting: Anesthesiology

## 2014-02-02 ENCOUNTER — Encounter (HOSPITAL_COMMUNITY): Payer: Self-pay | Admitting: *Deleted

## 2014-02-02 ENCOUNTER — Encounter (HOSPITAL_COMMUNITY)
Admission: RE | Disposition: A | Discharge status: Home / Self Care | Payer: Self-pay | Source: Ambulatory Visit | Attending: Surgery

## 2014-02-02 ENCOUNTER — Encounter (HOSPITAL_COMMUNITY): Payer: Managed Care, Other (non HMO) | Admitting: Anesthesiology

## 2014-02-02 DIAGNOSIS — M62838 Other muscle spasm: Secondary | ICD-10-CM | POA: Diagnosis not present

## 2014-02-02 DIAGNOSIS — M129 Arthropathy, unspecified: Secondary | ICD-10-CM | POA: Diagnosis present

## 2014-02-02 DIAGNOSIS — E876 Hypokalemia: Secondary | ICD-10-CM | POA: Diagnosis not present

## 2014-02-02 DIAGNOSIS — E43 Unspecified severe protein-calorie malnutrition: Secondary | ICD-10-CM

## 2014-02-02 DIAGNOSIS — R339 Retention of urine, unspecified: Secondary | ICD-10-CM

## 2014-02-02 DIAGNOSIS — K651 Peritoneal abscess: Secondary | ICD-10-CM

## 2014-02-02 DIAGNOSIS — Z1211 Encounter for screening for malignant neoplasm of colon: Secondary | ICD-10-CM

## 2014-02-02 DIAGNOSIS — M5030 Other cervical disc degeneration, unspecified cervical region: Secondary | ICD-10-CM | POA: Diagnosis present

## 2014-02-02 DIAGNOSIS — Z85038 Personal history of other malignant neoplasm of large intestine: Secondary | ICD-10-CM

## 2014-02-02 DIAGNOSIS — K660 Peritoneal adhesions (postprocedural) (postinfection): Secondary | ICD-10-CM | POA: Diagnosis present

## 2014-02-02 DIAGNOSIS — K632 Fistula of intestine: Secondary | ICD-10-CM

## 2014-02-02 DIAGNOSIS — K219 Gastro-esophageal reflux disease without esophagitis: Secondary | ICD-10-CM

## 2014-02-02 DIAGNOSIS — Y832 Surgical operation with anastomosis, bypass or graft as the cause of abnormal reaction of the patient, or of later complication, without mention of misadventure at the time of the procedure: Secondary | ICD-10-CM | POA: Diagnosis present

## 2014-02-02 DIAGNOSIS — G589 Mononeuropathy, unspecified: Secondary | ICD-10-CM | POA: Diagnosis present

## 2014-02-02 DIAGNOSIS — K5904 Chronic idiopathic constipation: Secondary | ICD-10-CM

## 2014-02-02 DIAGNOSIS — T8183XA Persistent postprocedural fistula, initial encounter: Principal | ICD-10-CM | POA: Diagnosis present

## 2014-02-02 DIAGNOSIS — C189 Malignant neoplasm of colon, unspecified: Secondary | ICD-10-CM

## 2014-02-02 HISTORY — PX: TAKE DOWN OF INTESTINAL FISTULA: SHX6107

## 2014-02-02 LAB — CREATININE, SERUM
CREATININE: 0.82 mg/dL (ref 0.50–1.35)
GFR calc Af Amer: >90 mL/min (ref >90)
GFR calc non Af Amer: >90 mL/min (ref >90)

## 2014-02-02 LAB — CBC
HCT: 34.9 % — ABNORMAL LOW (ref 39.0–52.0)
Hemoglobin: 11.7 g/dL — ABNORMAL LOW (ref 13.0–17.0)
MCH: 28.7 pg (ref 26.0–34.0)
MCHC: 33.5 g/dL (ref 30.0–36.0)
MCV: 85.5 fL (ref 78.0–100.0)
PLATELETS: 213 10*3/uL (ref 150–400)
RBC: 4.08 MIL/uL — ABNORMAL LOW (ref 4.22–5.81)
RDW: 14.9 % (ref 11.5–15.5)
WBC: 13.6 10*3/uL — ABNORMAL HIGH (ref 4.0–10.5)

## 2014-02-02 SURGERY — TAKE DOWN OF INTESTINAL FISTULA
Anesthesia: General | Site: Abdomen

## 2014-02-02 MED ORDER — 0.9 % SODIUM CHLORIDE (POUR BTL) OPTIME
TOPICAL | Status: DC | PRN
  Administered 2014-02-02 (×4): 1000 mL

## 2014-02-02 MED ORDER — OXYCODONE HCL 5 MG PO TABS: 5.0000 mg | ORAL_TABLET | Freq: Once | ORAL | Status: DC | PRN

## 2014-02-02 MED ORDER — KETOROLAC TROMETHAMINE 30 MG/ML IJ SOLN
INTRAMUSCULAR | Status: AC
  Administered 2014-02-02: 15 mg
  Filled 2014-02-02: qty 1

## 2014-02-02 MED ORDER — KETOROLAC TROMETHAMINE 15 MG/ML IJ SOLN
15.0000 mg | Freq: Four times a day (QID) | INTRAMUSCULAR | Status: DC | PRN
  Administered 2014-02-03 – 2014-02-07 (×14): 15 mg via INTRAVENOUS
  Filled 2014-02-02 (×15): qty 1

## 2014-02-02 MED ORDER — HYDROMORPHONE HCL PF 1 MG/ML IJ SOLN
INTRAMUSCULAR | Status: AC
  Filled 2014-02-02: qty 1

## 2014-02-02 MED ORDER — ENOXAPARIN SODIUM 40 MG/0.4ML ~~LOC~~ SOLN
40.0000 mg | SUBCUTANEOUS | Status: DC
  Administered 2014-02-03 – 2014-02-09 (×6): 40 mg via SUBCUTANEOUS
  Filled 2014-02-02 (×9): qty 0.4

## 2014-02-02 MED ORDER — LACTATED RINGERS IV SOLN
INTRAVENOUS | Status: DC | PRN
  Administered 2014-02-02 (×3): via INTRAVENOUS

## 2014-02-02 MED ORDER — ONDANSETRON HCL 4 MG/2ML IJ SOLN
4.0000 mg | Freq: Four times a day (QID) | INTRAMUSCULAR | Status: DC | PRN
  Administered 2014-02-05 – 2014-02-09 (×3): 4 mg via INTRAVENOUS
  Filled 2014-02-02 (×4): qty 2

## 2014-02-02 MED ORDER — VECURONIUM BROMIDE 10 MG IV SOLR
INTRAVENOUS | Status: DC | PRN
  Administered 2014-02-02: 1 mg via INTRAVENOUS

## 2014-02-02 MED ORDER — NALOXONE HCL 0.4 MG/ML IJ SOLN: 0.4000 mg | INTRAMUSCULAR | Status: DC | PRN

## 2014-02-02 MED ORDER — OXYCODONE HCL 5 MG/5ML PO SOLN: 5.0000 mg | Freq: Once | ORAL | Status: DC | PRN

## 2014-02-02 MED ORDER — NEOSTIGMINE METHYLSULFATE 1 MG/ML IJ SOLN
INTRAMUSCULAR | Status: AC
  Filled 2014-02-02: qty 10

## 2014-02-02 MED ORDER — DIPHENHYDRAMINE HCL 12.5 MG/5ML PO ELIX: 12.5000 mg | ORAL_SOLUTION | Freq: Four times a day (QID) | ORAL | Status: DC | PRN

## 2014-02-02 MED ORDER — ONDANSETRON HCL 4 MG/2ML IJ SOLN
INTRAMUSCULAR | Status: AC
  Filled 2014-02-02: qty 2

## 2014-02-02 MED ORDER — PROPOFOL 10 MG/ML IV BOLUS
INTRAVENOUS | Status: AC
  Filled 2014-02-02: qty 20

## 2014-02-02 MED ORDER — ACETAMINOPHEN 325 MG PO TABS
650.0000 mg | ORAL_TABLET | Freq: Four times a day (QID) | ORAL | Status: DC | PRN
  Administered 2014-02-04 – 2014-02-09 (×4): 650 mg via ORAL
  Filled 2014-02-02 (×5): qty 2

## 2014-02-02 MED ORDER — ROCURONIUM BROMIDE 100 MG/10ML IV SOLN
INTRAVENOUS | Status: DC | PRN
  Administered 2014-02-02: 50 mg via INTRAVENOUS

## 2014-02-02 MED ORDER — ALBUTEROL SULFATE (2.5 MG/3ML) 0.083% IN NEBU: 3.0000 mL | INHALATION_SOLUTION | Freq: Four times a day (QID) | RESPIRATORY_TRACT | Status: DC | PRN

## 2014-02-02 MED ORDER — SODIUM CHLORIDE 0.9 % IJ SOLN: 9.0000 mL | INTRAMUSCULAR | Status: DC | PRN

## 2014-02-02 MED ORDER — ROCURONIUM BROMIDE 50 MG/5ML IV SOLN
INTRAVENOUS | Status: AC
  Filled 2014-02-02: qty 1

## 2014-02-02 MED ORDER — LIDOCAINE HCL (CARDIAC) 20 MG/ML IV SOLN
INTRAVENOUS | Status: DC | PRN
  Administered 2014-02-02: 80 mg via INTRAVENOUS

## 2014-02-02 MED ORDER — GLYCOPYRROLATE 0.2 MG/ML IJ SOLN
INTRAMUSCULAR | Status: AC
  Filled 2014-02-02: qty 3

## 2014-02-02 MED ORDER — DEXTROSE IN LACTATED RINGERS 5 % IV SOLN
INTRAVENOUS | Status: DC
  Administered 2014-02-02 – 2014-02-04 (×5): via INTRAVENOUS
  Administered 2014-02-05: 125 mL/h via INTRAVENOUS
  Administered 2014-02-05 – 2014-02-08 (×9): via INTRAVENOUS

## 2014-02-02 MED ORDER — FENTANYL CITRATE 0.05 MG/ML IJ SOLN
INTRAMUSCULAR | Status: AC
  Filled 2014-02-02: qty 5

## 2014-02-02 MED ORDER — PROPOFOL 10 MG/ML IV BOLUS
INTRAVENOUS | Status: DC | PRN
  Administered 2014-02-02: 170 mg via INTRAVENOUS

## 2014-02-02 MED ORDER — LACTATED RINGERS IV SOLN
INTRAVENOUS | Status: DC
  Administered 2014-02-02: 11:00:00 via INTRAVENOUS

## 2014-02-02 MED ORDER — FENTANYL CITRATE 0.05 MG/ML IJ SOLN
INTRAMUSCULAR | Status: DC | PRN
  Administered 2014-02-02 (×3): 50 ug via INTRAVENOUS
  Administered 2014-02-02: 150 ug via INTRAVENOUS
  Administered 2014-02-02 (×2): 50 ug via INTRAVENOUS
  Administered 2014-02-02: 100 ug via INTRAVENOUS

## 2014-02-02 MED ORDER — HYDROMORPHONE 0.3 MG/ML IV SOLN
INTRAVENOUS | Status: DC
  Administered 2014-02-02: 2.9 mg via INTRAVENOUS
  Administered 2014-02-02: 18:00:00 via INTRAVENOUS
  Administered 2014-02-02: 2.9 mg via INTRAVENOUS
  Administered 2014-02-03: 21:00:00 via INTRAVENOUS
  Administered 2014-02-03: 3 mg via INTRAVENOUS
  Administered 2014-02-03: 2.1 mg via INTRAVENOUS
  Administered 2014-02-03: 11:00:00 via INTRAVENOUS
  Administered 2014-02-03: 5.7 mg via INTRAVENOUS
  Administered 2014-02-03: 3.9 mg via INTRAVENOUS
  Administered 2014-02-03: 1.33 mg via INTRAVENOUS
  Administered 2014-02-04: 1.93 mg via INTRAVENOUS
  Administered 2014-02-04: 3.3 mg via INTRAVENOUS
  Administered 2014-02-04: 0.9 mg via INTRAVENOUS
  Administered 2014-02-04: 2.7 mg via INTRAVENOUS
  Administered 2014-02-04: 08:00:00 via INTRAVENOUS
  Administered 2014-02-04 (×2): 2.1 mg via INTRAVENOUS
  Administered 2014-02-05: 0.9 mg via INTRAVENOUS
  Administered 2014-02-05: 0.3 mg via INTRAVENOUS
  Administered 2014-02-05: 1.2 mg via INTRAVENOUS
  Filled 2014-02-02 (×5): qty 25

## 2014-02-02 MED ORDER — DIPHENHYDRAMINE HCL 50 MG/ML IJ SOLN: 12.5000 mg | Freq: Four times a day (QID) | INTRAMUSCULAR | Status: DC | PRN

## 2014-02-02 MED ORDER — ONDANSETRON HCL 4 MG PO TABS: 4.0000 mg | ORAL_TABLET | Freq: Four times a day (QID) | ORAL | Status: DC | PRN

## 2014-02-02 MED ORDER — NEOSTIGMINE METHYLSULFATE 1 MG/ML IJ SOLN
INTRAMUSCULAR | Status: DC | PRN
  Administered 2014-02-02: 3 mg via INTRAVENOUS

## 2014-02-02 MED ORDER — HYDROMORPHONE HCL PF 1 MG/ML IJ SOLN
0.2500 mg | INTRAMUSCULAR | Status: DC | PRN
  Administered 2014-02-02 (×3): 0.5 mg via INTRAVENOUS

## 2014-02-02 MED ORDER — PROMETHAZINE HCL 25 MG/ML IJ SOLN: 6.2500 mg | INTRAMUSCULAR | Status: DC | PRN

## 2014-02-02 MED ORDER — GLYCOPYRROLATE 0.2 MG/ML IJ SOLN
INTRAMUSCULAR | Status: DC | PRN
Start: 2014-02-02 — End: 2014-02-02
  Administered 2014-02-02: 0.4 mg via INTRAVENOUS

## 2014-02-02 MED ORDER — HYDROMORPHONE 0.3 MG/ML IV SOLN
INTRAVENOUS | Status: AC
  Filled 2014-02-02: qty 25

## 2014-02-02 MED ORDER — MIDAZOLAM HCL 2 MG/2ML IJ SOLN
INTRAMUSCULAR | Status: AC
  Filled 2014-02-02: qty 2

## 2014-02-02 MED ORDER — PREGABALIN 75 MG PO CAPS
75.0000 mg | ORAL_CAPSULE | Freq: Every evening | ORAL | Status: DC
  Filled 2014-02-02 (×2): qty 3

## 2014-02-02 MED ORDER — EPHEDRINE SULFATE 50 MG/ML IJ SOLN
INTRAMUSCULAR | Status: AC
  Filled 2014-02-02: qty 1

## 2014-02-02 MED ORDER — ONDANSETRON HCL 4 MG/2ML IJ SOLN
INTRAMUSCULAR | Status: DC | PRN
  Administered 2014-02-02: 4 mg via INTRAVENOUS

## 2014-02-02 MED ORDER — ONDANSETRON HCL 4 MG/2ML IJ SOLN
4.0000 mg | Freq: Four times a day (QID) | INTRAMUSCULAR | Status: DC | PRN
  Administered 2014-02-03 – 2014-02-05 (×4): 4 mg via INTRAVENOUS
  Filled 2014-02-02 (×4): qty 2

## 2014-02-02 MED ORDER — SODIUM CHLORIDE 0.9 % IJ SOLN
INTRAMUSCULAR | Status: AC
  Filled 2014-02-02: qty 10

## 2014-02-02 MED ORDER — DEXTROSE 5 % IV SOLN
1.0000 g | Freq: Four times a day (QID) | INTRAVENOUS | Status: AC
  Administered 2014-02-02: 1 g via INTRAVENOUS
  Filled 2014-02-02: qty 1

## 2014-02-02 MED ORDER — MIDAZOLAM HCL 5 MG/5ML IJ SOLN
INTRAMUSCULAR | Status: DC | PRN
  Administered 2014-02-02: 2 mg via INTRAVENOUS

## 2014-02-02 MED ORDER — GLYCOPYRROLATE 0.2 MG/ML IJ SOLN
INTRAMUSCULAR | Status: AC
  Filled 2014-02-02: qty 1

## 2014-02-02 SURGICAL SUPPLY — 62 items
BLADE SURG ROTATE 9660 (MISCELLANEOUS) ×2 IMPLANT
CANISTER SUCTION 2500CC (MISCELLANEOUS) ×3 IMPLANT
COVER MAYO STAND STRL (DRAPES) ×4 IMPLANT
COVER SURGICAL LIGHT HANDLE (MISCELLANEOUS) ×6 IMPLANT
DRAPE LAPAROSCOPIC ABDOMINAL (DRAPES) ×3 IMPLANT
DRAPE PROXIMA HALF (DRAPES) IMPLANT
DRAPE UTILITY 15X26 W/TAPE STR (DRAPE) ×15 IMPLANT
DRAPE WARM FLUID 44X44 (DRAPE) ×3 IMPLANT
DRSG OPSITE POSTOP 4X10 (GAUZE/BANDAGES/DRESSINGS) ×2 IMPLANT
DRSG OPSITE POSTOP 4X8 (GAUZE/BANDAGES/DRESSINGS) IMPLANT
ELECT BLADE 6.5 EXT (BLADE) ×3 IMPLANT
ELECT CAUTERY BLADE 6.4 (BLADE) ×6 IMPLANT
ELECT REM PT RETURN 9FT ADLT (ELECTROSURGICAL) ×3
ELECTRODE REM PT RTRN 9FT ADLT (ELECTROSURGICAL) ×1 IMPLANT
GEL ULTRASOUND 20GR AQUASONIC (MISCELLANEOUS) IMPLANT
GLOVE BIO SURGEON STRL SZ 6.5 (GLOVE) ×1 IMPLANT
GLOVE BIO SURGEON STRL SZ8 (GLOVE) ×8 IMPLANT
GLOVE BIO SURGEONS STRL SZ 6.5 (GLOVE) ×1
GLOVE BIOGEL PI IND STRL 6.5 (GLOVE) IMPLANT
GLOVE BIOGEL PI IND STRL 7.0 (GLOVE) IMPLANT
GLOVE BIOGEL PI IND STRL 8 (GLOVE) ×2 IMPLANT
GLOVE BIOGEL PI INDICATOR 6.5 (GLOVE) ×2
GLOVE BIOGEL PI INDICATOR 7.0 (GLOVE) ×2
GLOVE BIOGEL PI INDICATOR 8 (GLOVE) ×4
GLOVE ECLIPSE 6.5 STRL STRAW (GLOVE) ×4 IMPLANT
GLOVE SURG SS PI 6.5 STRL IVOR (GLOVE) ×4 IMPLANT
GOWN STRL REUS W/ TWL LRG LVL3 (GOWN DISPOSABLE) ×4 IMPLANT
GOWN STRL REUS W/ TWL XL LVL3 (GOWN DISPOSABLE) ×2 IMPLANT
GOWN STRL REUS W/TWL LRG LVL3 (GOWN DISPOSABLE) ×12
GOWN STRL REUS W/TWL XL LVL3 (GOWN DISPOSABLE) ×9
KIT BASIN OR (CUSTOM PROCEDURE TRAY) ×3 IMPLANT
KIT ROOM TURNOVER OR (KITS) ×3 IMPLANT
LEGGING LITHOTOMY PAIR STRL (DRAPES) IMPLANT
LIGASURE IMPACT 36 18CM CVD LR (INSTRUMENTS) ×2 IMPLANT
NS IRRIG 1000ML POUR BTL (IV SOLUTION) ×10 IMPLANT
PACK GENERAL/GYN (CUSTOM PROCEDURE TRAY) ×3 IMPLANT
PAD ARMBOARD 7.5X6 YLW CONV (MISCELLANEOUS) ×3 IMPLANT
PENCIL BUTTON HOLSTER BLD 10FT (ELECTRODE) ×3 IMPLANT
RELOAD PROXIMATE 75MM BLUE (ENDOMECHANICALS) ×12 IMPLANT
RELOAD STAPLE 75 3.8 BLU REG (ENDOMECHANICALS) IMPLANT
SPONGE LAP 18X18 X RAY DECT (DISPOSABLE) ×8 IMPLANT
STAPLER GUN LINEAR PROX 60 (STAPLE) ×2 IMPLANT
STAPLER PROXIMATE 75MM BLUE (STAPLE) ×2 IMPLANT
STAPLER VISISTAT 35W (STAPLE) ×3 IMPLANT
SUCTION POOLE TIP (SUCTIONS) ×3 IMPLANT
SURGILUBE 2OZ TUBE FLIPTOP (MISCELLANEOUS) IMPLANT
SUT PDS AB 1 TP1 96 (SUTURE) ×6 IMPLANT
SUT PROLENE 2 0 CT2 30 (SUTURE) IMPLANT
SUT PROLENE 2 0 KS (SUTURE) IMPLANT
SUT SILK 3 0 TIES 10X30 (SUTURE) ×1 IMPLANT
SUT VIC AB 2-0 SH 18 (SUTURE) ×5 IMPLANT
SUT VIC AB 3-0 SH 18 (SUTURE) ×3 IMPLANT
SUT VICRYL AB 2 0 TIES (SUTURE) ×3 IMPLANT
SUT VICRYL AB 3 0 TIES (SUTURE) ×3 IMPLANT
SYR BULB IRRIGATION 50ML (SYRINGE) ×3 IMPLANT
TOWEL OR 17X26 10 PK STRL BLUE (TOWEL DISPOSABLE) ×4 IMPLANT
TRAY FOLEY CATH 16FRSI W/METER (SET/KITS/TRAYS/PACK) ×2 IMPLANT
TRAY PROCTOSCOPIC FIBER OPTIC (SET/KITS/TRAYS/PACK) IMPLANT
TUBE CONNECTING 12'X1/4 (SUCTIONS) ×1
TUBE CONNECTING 12X1/4 (SUCTIONS) ×2 IMPLANT
UNDERPAD 30X30 INCONTINENT (UNDERPADS AND DIAPERS) IMPLANT
YANKAUER SUCT BULB TIP NO VENT (SUCTIONS) ×3 IMPLANT

## 2014-02-02 NOTE — Anesthesia Preprocedure Evaluation (Signed)
Anesthesia Evaluation  Patient identified by MRN, date of birth, ID band Patient awake    Reviewed: Allergy & Precautions, H&P , NPO status , Patient's Chart, lab work & pertinent test results  Airway Mallampati: II TM Distance: >3 FB Neck ROM: Full    Dental  (+) Teeth Intact, Dental Advisory Given   Pulmonary neg pulmonary ROS,    Pulmonary exam normal       Cardiovascular negative cardio ROS      Neuro/Psych  Headaches, negative psych ROS   GI/Hepatic Neg liver ROS, GERD-  ,  Endo/Other  negative endocrine ROS  Renal/GU negative Renal ROS  negative genitourinary   Musculoskeletal negative musculoskeletal ROS (+)   Abdominal   Peds negative pediatric ROS (+)  Hematology negative hematology ROS (+)   Anesthesia Other Findings   Reproductive/Obstetrics negative OB ROS                           Anesthesia Physical Anesthesia Plan  ASA: III  Anesthesia Plan: General   Post-op Pain Management:    Induction: Intravenous  Airway Management Planned: Oral ETT  Additional Equipment:   Intra-op Plan:   Post-operative Plan: Extubation in OR  Informed Consent: I have reviewed the patients History and Physical, chart, labs and discussed the procedure including the risks, benefits and alternatives for the proposed anesthesia with the patient or authorized representative who has indicated his/her understanding and acceptance.   Dental advisory given  Plan Discussed with: CRNA, Anesthesiologist and Surgeon  Anesthesia Plan Comments:         Anesthesia Quick Evaluation

## 2014-02-02 NOTE — Transfer of Care (Signed)
Immediate Anesthesia Transfer of Care Note  Patient: Eduardo [name]  Procedure(s) Performed: Procedure(s): TAKE DOWN OF ENTEROCUTANEOUS  FISTULA (N/A)  Patient Location: PACU  Anesthesia Type:General  Level of Consciousness: awake, oriented and patient cooperative  Airway & Oxygen Therapy: Patient Spontanous Breathing and Patient connected to face mask oxygen  Post-op Assessment: Report given to PACU RN and Post -op Vital signs reviewed and stable  Post vital signs: Reviewed  Complications: No apparent anesthesia complications

## 2014-02-02 NOTE — Anesthesia Postprocedure Evaluation (Signed)
Anesthesia Post Note  Patient: Eduardo [name]  Procedure(s) Performed: Procedure(s) (LRB): TAKE DOWN OF ENTEROCUTANEOUS  FISTULA (N/A)  Anesthesia type: General  Patient location: PACU  Post pain: Pain level controlled  Post assessment: Patient's Cardiovascular Status Stable  Last Vitals:  Filed Vitals:   02/02/14 1815  BP: 123/81  Pulse: 95  Temp: 36.9 C  Resp: 19    Post vital signs: Reviewed and stable  Level of consciousness: alert  Complications: No apparent anesthesia complications

## 2014-02-02 NOTE — H&P (View-Only) (Signed)
Subjective:     Patient ID: Eduardo [name], male   DOB: 05-Nov-1945, 68 y.o.   MRN: [id number]  HPI  Patient returns for followup of his enterocutaneous fistula 7  weeks after elective right hemicolectomy by Dr. Aviva Signs at [facility].  He was transferred after he developed an abscess 1 week after surgery. Dr. Arnoldo Morale wish to explore the patient at that time the patient and family wish to be transferred. I was contacted Arnoldo Morale and except the patient. Per drain showed bilious material consistent with small bowel contents. He was placed n.p.o., antibiotics and TNA was started. He is being discharged on nighttime TNA. Patient underwent drain study this week which showed small communicating fistula. He is very tired and weak but otherwise doing okay. Drain output is about 30  75 cc cc a day. If he takes more and by mouth he does go up we'll more than that he states. Denies fever or chills. No bowel pain.still feels very tired. The catheter is hurting him under his rib cage. He feels short of breath at times without chest pain. His vital signs today are normal low. He feels a little better but output the same from [place].  Review of Systems  Respiratory: Negative.   Cardiovascular: Negative.   Gastrointestinal: Negative.        Objective:   Physical Exam  Constitutional: He appears lethargic. He has a sickly appearance.  Eyes: No scleral icterus.  Neck: Normal range of motion.  Cardiovascular: Normal rate and regular rhythm.   Pulmonary/Chest: Effort normal and breath sounds normal.  Abdominal: There is no tenderness.    Neurological: He appears lethargic.  Skin: Skin is warm and dry.  CLINICAL DATA: 68 year old male with history of fistula following  percutaneous abscess drain which developed post partial colectomy  for colon cancer. Subsequent encounter.  EXAM:  CT ABDOMEN AND PELVIS WITH CONTRAST  TECHNIQUE:  Multidetector CT imaging of the abdomen and pelvis was performed  using the standard  protocol following bolus administration of  intravenous contrast.  CONTRAST: 125 mL Omnipaque 300  1.2 L water-soluble per-rectum contrast also administered.  COMPARISON: 10/2013 and earlier.  FINDINGS:  Improved ventilation at both lung bases. No pericardial or pleural  effusion. Residual scarring or atelectasis. No pulmonary nodule or  mass identified. Negative visible mediastinal structures.  Stable visualized osseous structures.  Rectal balloon catheter in place. Large bowel contrast was  administered in a retrograde fashion. Could colonic distention.  Redundant sigmoid colon. Sequelae of bilateral inguinal/ lower  abdominal hernia repair. Negative left colon. Negative proximal  transverse colon. Negative Mid transverse colon.  At the right hepatic flexure there is a small bowel to large bowel  anastomosis with an adjacent percutaneous pigtail drainage catheter.  At the time of the initial images contrast head traversed this  anastomosis in a retrograde fashion, opacifying multiple nondilated  distal small bowel loops (series 2, image 64). There is no overt  leakage of contrast from the anastomosis, but there is a min amount  of what appears to be extraluminal contrast adjacent to the pigtail  drain (series 2, image 55), in an area of irregular soft tissue  probable granulation. However, this appearance has improved compared  to 10/2013 at which time a larger volume of contrast was evident at  the drain (series 2, image 49 on that study).  No dilated small bowel loops. No per oral contrast administered.  Largely decompressed stomach and duodenum. No abdominal free fluid.  No pneumoperitoneum.  Stable liver, gallbladder, spleen, pancreas, adrenal glands, and  kidneys. Portal venous system within normal limits. Major arterial  structures in the abdomen and pelvis are patent. Mild Aortoiliac  calcified atherosclerosis noted. No lymphadenopathy identified.  Chronic ventral midline  abdomen incision unchanged. No acute  inflammation.  IMPRESSION:  1. Persistent small fistula connection between the during cavity and  the anastomosis, with trace rectally administered contrast  collecting adjacent to the pigtail drain (series 2, image 55), but  this volume of abnormal contrast appears decreased compared to  11/09/13 (compare with series 2, image 49 on that study).  2. The retrograde contrast did pass through the anastomosis into  nondilated distal small bowel during this exam.  3. Otherwise stable abdomen and pelvis. No metastatic disease  identified.  Electronically Signed  By: Lars Pinks M.D. CLINICAL DATA: 68 year old male shortness of breath since partial  colectomy 1 month ago. Initial encounter. Colon cancer.  Enterocutaneous fistula.  EXAM:  CHEST 2 VIEW  COMPARISON: Chest radiographs 10/1913 and earlier.  FINDINGS:  Right PICC line now in place. Stable lung volumes. Stable mild to  moderate elevation of the right hemidiaphragm. Normal cardiac size  and mediastinal contours. Small calcified left hilar nodes suspected  and unchanged. No pneumothorax or pulmonary edema. No pleural  effusion or acute pulmonary opacity. Stable visualized osseous  structures. Small volume of oral contrast in nondilated colon in the  upper abdomen.  IMPRESSION:  No acute or metastatic cardiopulmonary abnormality.      Assessment:     Enterocutaneous fistula now 12 weeks out from right hemicolectomy for colon cancer stage III  Protein calorie malnutrition moderate       improved   Fatigue    Plan:    Pt has failed conservative means and will require resection of anastomosis.  The procedure was discussed with the patient.  partial colectomy and anstomosis  discussed with the patient as well as non operative treatments. The risks of operative management include bleeding,  Infection,  Leak of anastamosis,  Ostomy formation, open procedure,  Sepsis,  Abcess,  Hernia,  DVT,   Pulmonary complications,  Cardiovascular  complications,  Injury to ureter,  Bladder,kidney,and anesthesia risks,  And death. The patient understands.  Questions answered.   The success of the procedure is 50-100  % for treating the patients symptoms. They agree to proceed.

## 2014-02-02 NOTE — Progress Notes (Addendum)
Labs drawn on [facility]-18-14 with [facility]. Spoke with Dr Ermalene Postin and no need to repeat labs at this time.  [facility] nurse called Leafy Ro) message left to return call if pt had further labs since 01-18-14.

## 2014-02-02 NOTE — Interval H&P Note (Signed)
History and Physical Interval Note:  02/02/2014 1:20 PM  Eduardo [name]  has presented today for surgery, with the diagnosis of ENTEROCUTANEOUS FISTULA   The various methods of treatment have been discussed with the patient and family. After consideration of risks, benefits and other options for treatment, the patient has consented to  Procedure(s): TAKE DOWN OF ENTEROCUTANEOUS  FISTULA (N/A) as a surgical intervention .  The patient's history has been reviewed, patient examined, no change in status, stable for surgery.  I have reviewed the patient's chart and labs.  Questions were answered to the patient's satisfaction.     Eduardo [name]

## 2014-02-02 NOTE — Brief Op Note (Signed)
02/02/2014  4:08 PM  PATIENT:  Eduardo [name]  68 y.o. male  PRE-OPERATIVE DIAGNOSIS:  ENTEROCUTANEOUS FISTULA   POST-OPERATIVE DIAGNOSIS:  ENTEROCUTANEOUS FISTULA   PROCEDURE:  Procedure(s): TAKE DOWN OF ENTEROCUTANEOUS  FISTULA (N/A)  SURGEON:  Surgeon(s) and Role:    * Coda Mathey A. Shaquelle Hernon, MD - Primary    ASSISTANTS: Dr Georgette Dover   ANESTHESIA:   general  EBL:  Total I/O In: 2000 [I.V.:2000] Out: 43 [Blood:50]  BLOOD ADMINISTERED:none  DRAINS: none   LOCAL MEDICATIONS USED:  NONE  SPECIMEN:  Source of Specimen:  ileocolonic anastamosis  DISPOSITION OF SPECIMEN:  PATHOLOGY  COUNTS:  YES  TOURNIQUET:  * No tourniquets in log *  DICTATION: .Other Dictation: Dictation Number [id number]  PLAN OF CARE: Admit to inpatient   PATIENT DISPOSITION:  PACU - hemodynamically stable.   Delay start of Pharmacological VTE agent (>24hrs) due to surgical blood loss or risk of bleeding: no

## 2014-02-02 NOTE — Progress Notes (Signed)
Order noted for e-mycin and neomycin to be taken pre-op.  Dr Donella Stade called and informed that he did not take these meds at home this am,(pt wife states it was marked out on the papers they received). Dr Donella Stade states not to take these now.  Pt did take the bowel prep as directed.

## 2014-02-02 NOTE — Anesthesia Procedure Notes (Signed)
Procedure Name: Intubation Date/Time: 02/02/2014 1:47 PM Performed by: [employer], Sheron Nightingale Pre-anesthesia Checklist: Patient identified, Timeout performed, Emergency Drugs available, Suction available and Patient being monitored Patient Re-evaluated:Patient Re-evaluated prior to inductionOxygen Delivery Method: Circle system utilized Preoxygenation: Pre-oxygenation with 100% oxygen Intubation Type: IV induction Ventilation: Mask ventilation without difficulty Laryngoscope Size: Mac and 3 Grade View: Grade II Tube size: 7.0 mm Number of attempts: 1 Placement Confirmation: ETT inserted through vocal cords under direct vision,  breath sounds checked- equal and bilateral and positive ETCO2 Secured at: 22 cm Dental Injury: Teeth and Oropharynx as per pre-operative assessment

## 2014-02-03 ENCOUNTER — Encounter (INDEPENDENT_AMBULATORY_CARE_PROVIDER_SITE_OTHER): Payer: Self-pay

## 2014-02-03 ENCOUNTER — Encounter (HOSPITAL_COMMUNITY): Payer: Self-pay | Admitting: Surgery

## 2014-02-03 LAB — CBC
HEMATOCRIT: 35.7 % — AB (ref 39.0–52.0)
HEMOGLOBIN: 11.8 g/dL — AB (ref 13.0–17.0)
MCH: 28.7 pg (ref 26.0–34.0)
MCHC: 33.1 g/dL (ref 30.0–36.0)
MCV: 86.9 fL (ref 78.0–100.0)
Platelets: 236 10*3/uL (ref 150–400)
RBC: 4.11 MIL/uL — ABNORMAL LOW (ref 4.22–5.81)
RDW: 15.3 % (ref 11.5–15.5)
WBC: 13.6 10*3/uL — ABNORMAL HIGH (ref 4.0–10.5)

## 2014-02-03 LAB — BASIC METABOLIC PANEL
BUN: 13 mg/dL (ref 6–23)
CHLORIDE: 103 meq/L (ref 96–112)
CO2: 26 mEq/L (ref 19–32)
Calcium: 8.4 mg/dL (ref 8.4–10.5)
Creatinine, Ser: 0.92 mg/dL (ref 0.50–1.35)
GFR calc Af Amer: >90 mL/min (ref >90)
GFR calc non Af Amer: 87 mL/min — ABNORMAL LOW (ref >90)
Glucose, Bld: 120 mg/dL — ABNORMAL HIGH (ref 70–99)
POTASSIUM: 3.8 meq/L (ref 3.7–5.3)
SODIUM: 143 meq/L (ref 137–147)

## 2014-02-03 MED ORDER — BACLOFEN 1 MG/ML ORAL SUSPENSION
5.0000 mg | Freq: Three times a day (TID) | ORAL | Status: DC
  Administered 2014-02-03 – 2014-02-10 (×21): 5 mg
  Filled 2014-02-03 (×25): qty 0.5

## 2014-02-03 MED ORDER — SODIUM CHLORIDE 0.9 % IJ SOLN
10.0000 mL | INTRAMUSCULAR | Status: DC | PRN
  Administered 2014-02-04 – 2014-02-05 (×2): 10 mL
  Administered 2014-02-06: 20 mL
  Administered 2014-02-07 – 2014-02-09 (×4): 10 mL

## 2014-02-03 NOTE — Progress Notes (Signed)
1 Day Post-Op  Subjective: PT WITH PAIN ISSUES AND MUSCLE SPASMS.  PCA TUBING BROKE OVERNIGHT.  NO FLATUS  Objective: Vital signs in last 24 hours: Temp:  [97.5 F (36.4 C)-98.5 F (36.9 C)] 97.6 F (36.4 C) (04/16 0520) Pulse Rate:  [70-103] 102 (04/16 0520) Resp:  [12-21] 13 (04/16 0728) BP: (111-151)/(64-90) 122/72 mmHg (04/16 0520) SpO2:  [92 %-99 %] 96 % (04/16 0728) Weight:  [220 lb (99.791 kg)] 220 lb (99.791 kg) (04/15 1024)    Intake/Output from previous day: 04/15 0701 - 04/16 0700 In: 2000 [I.V.:2000] Out: 2075 [Urine:1625; Emesis/NG output:400; Blood:50] Intake/Output this shift: Total I/O In: 1589.6 [I.V.:1589.6] Out: -   Incision/Wound:C/D/I UNDER HONEYCOMB DRESSING.   FLAT SOFT ABDOMEN  Lab Results:   Recent Labs  02/02/14 2210 02/03/14 0551  WBC 13.6* 13.6*  HGB 11.7* 11.8*  HCT 34.9* 35.7*  PLT 213 236   BMET  Recent Labs  02/02/14 2210 02/03/14 0551  NA  --  143  K  --  3.8  CL  --  103  CO2  --  26  GLUCOSE  --  120*  BUN  --  13  CREATININE 0.82 0.92  CALCIUM  --  8.4   PT/INR No results found for this basename: LABPROT, INR,  in the last 72 hours ABG No results found for this basename: PHART, PCO2, PO2, HCO3,  in the last 72 hours  Studies/Results: No results found.  Anti-infectives: Anti-infectives   Start     Dose/Rate Route Frequency Ordered Stop   02/02/14 2000  cefOXitin (MEFOXIN) 1 g in dextrose 5 % 50 mL IVPB     1 g 100 mL/hr over 30 Minutes Intravenous 4 times per day 02/02/14 1838 02/02/14 2113   02/02/14 0600  cefoTEtan (CEFOTAN) 2 g in dextrose 5 % 50 mL IVPB     2 g 100 mL/hr over 30 Minutes Intravenous On call to O.R. 02/01/14 1442 02/02/14 1338   02/01/14 1442  neomycin (MYCIFRADIN) tablet 1,000 mg  Status:  Discontinued     1,000 mg Oral 3 times per day 02/01/14 1442 02/02/14 1838   02/01/14 1442  erythromycin (E-MYCIN) tablet 1,000 mg  Status:  Discontinued     1,000 mg Oral 3 times per day 02/01/14 1442  02/02/14 1838      Assessment/Plan: s/p Procedure(s): TAKE DOWN OF ENTEROCUTANEOUS  FISTULA (N/A) Add baclofen 5 mg po tid for spasms OOB D/C foley Cont NGT   LOS: 1 day    Neema Fluegge A. Loys Hoselton 02/03/2014

## 2014-02-03 NOTE — Progress Notes (Signed)
Patients PCA tubing observed leaking during the night. PCA tubing changed and wasted.

## 2014-02-03 NOTE — Op Note (Signed)
Eduardo [name], DEVEREUX                 ACCOUNT NO.:  [id number]  MEDICAL RECORD NO.:  [id number]  LOCATION:  6N22C                        FACILITY:  [place]  PHYSICIAN:  Marcello Moores A. Declan Mier, M.D.DATE OF BIRTH:  Oct 14, 1946  DATE OF PROCEDURE:  02/02/2014 DATE OF DISCHARGE:                              OPERATIVE REPORT   PREOPERATIVE DIAGNOSIS:  History of right colectomy with intracutaneous fistula from anastomotic breakdown.  POSTOPERATIVE DIAGNOSIS:  History of right colectomy with intracutaneous fistula from anastomotic breakdown.  PROCEDURE:  Takedown of ileocolonic enterocutaneous fistula with partial colectomy.  SURGEON:  Marcello Moores A. Danial Sisley, M.D.  ASSISTANT:  Imogene Burn. Tsuei, M.D.  ANESTHESIA:  General endotracheal anesthesia.  EBL:  50 mL.  SPECIMEN:  Ileocolonic anastomosis to pathology.  DRAINS:  None.  IV FLUIDS:  2000 mL of crystalloid.  INDICATIONS FOR PROCEDURE:  The patient is a 68 year old male with stage III colon cancer who underwent a right hemicolectomy at [facility] in January, 2015.  He developed an abscess and a postoperative leak and requested transfer to [facility] for management of these complications from [facility].  He was taken in transfer at that point and I managed non-operatively with a percutaneous drain and TNA.  After 8 weeks of therapy unfortunately he did not heal and I recommended surgical resection at this point.  His nutritional parameters were much improved compared to his initial hospitalization.  He was gaining weight.  His albumin was normal.  I discussed the risks of procedure to include, but not exclusive of bleeding, infection, anastomotic breakdown with recurrence of fistula, possible ostomy, death, DVT, incisional hernia, wound infection, anesthesia risks.  He understood the above and agreed to proceed.  DESCRIPTION OF PROCEDURE:  The patient was met in the holding area and questions answered.  He was then taken  back to the operating room and placed supine on the OR table.  After induction of general endotracheal anesthesia, I removed his previous percutaneous drain from his right flank.  Abdomen then prepped and draped in sterile fashion.  He received 2 g of cefoxitin.  Time-out was done.  Midline incision was used to the old scar to enters the abdominal cavity.  He had severe adhesions to his anterior abdominal wall taken with down with Metzenbaum scissors.  He had significant right upper quadrant inflammation, which extended over toward the right flank where the drain was.  I was able to take small bowel adhesions down and traced this up to his anastomotic site in the right upper quadrant.  I then found the transverse colon and took the omentum off this to be better visualized colon all way to the anastomosis.  This was stuck towards his right flank posteriorly and this was very tedious and had significant amount of chronic inflammatory change.  I was able to mobilize it from off an abscess cavity and pull the entire anastomosis up in the field, which included the fistula tract.  I then divided the small bowel proximal to this about 20 cm back to some healthy small bowel using a GIA-75 stapling device.  We then went about 5 cm distal to the old anastomotic site and divided the  transverse colon with the GIA-75 stapling device.  We then created a side-to-side functional and end-to-end anastomosis using a single fire of the GIA-75 stapler and TA-60 to close the common enterotomy. Hemostasis was excellent.  The common mesenteric defect closed with 2-0 Vicryl.  The anastomosis was widely patent at 2 fingerbreadths and a stitch was placed of 3-0 Vicryl to crotch the anastomosis.  There was no kinking or twisting of the anastomosis and laid very naturally in the upper epigastrium.  Irrigation was used and suctioned out.  The right flank was reexamined and hemostasis at the previous Drain  site was  good.  After irrigation the abdominal cavity nasogastric tube was placed and verified in the stomach.  We then changed gown, gloves, and followed the colonic protocol.  We then closed the fascia with double-stranded #1 PDS.  The subcutaneous fat was irrigated and skin was closed with staples.  All final counts of sponge, needle, instruments were found to be correct with this portion of the case.  The patient was extubated and taken to recovery in satisfactory condition.     Carolyne Whitsel A. Azyiah Bo, M.D.     TAC/MEDQ  D:  02/02/2014  T:  02/03/2014  Job:  [id number]

## 2014-02-03 NOTE — Progress Notes (Signed)
Patient sitting up in chair, attempted to void after foley catheter removed at 8:30 a.m. Patient states he does not feel like he has to void and is unable to pass any urine.  After patient back in bed bladder scan reveals greater than 410 ccs in bladder.  In and out cath performed as per protocol, 500 ccs of amber urine returned.  Will continue to monitor and encourage spontaneous void.

## 2014-02-04 ENCOUNTER — Encounter (INDEPENDENT_AMBULATORY_CARE_PROVIDER_SITE_OTHER): Payer: Self-pay

## 2014-02-04 ENCOUNTER — Inpatient Hospital Stay (HOSPITAL_COMMUNITY): Payer: Managed Care, Other (non HMO)

## 2014-02-04 LAB — BASIC METABOLIC PANEL
BUN: 15 mg/dL (ref 6–23)
CALCIUM: 8.6 mg/dL (ref 8.4–10.5)
CO2: 29 meq/L (ref 19–32)
CREATININE: 1.11 mg/dL (ref 0.50–1.35)
Chloride: 106 mEq/L (ref 96–112)
GFR calc Af Amer: 79 mL/min — ABNORMAL LOW (ref >90)
GFR calc non Af Amer: 68 mL/min — ABNORMAL LOW (ref >90)
Glucose, Bld: 116 mg/dL — ABNORMAL HIGH (ref 70–99)
Potassium: 3.5 mEq/L — ABNORMAL LOW (ref 3.7–5.3)
Sodium: 144 mEq/L (ref 137–147)

## 2014-02-04 LAB — URINALYSIS, ROUTINE W REFLEX MICROSCOPIC
Bilirubin Urine: NEGATIVE
Glucose, UA: NEGATIVE mg/dL
Hgb urine dipstick: NEGATIVE
Ketones, ur: NEGATIVE mg/dL
NITRITE: NEGATIVE
PROTEIN: 100 mg/dL — AB
SPECIFIC GRAVITY, URINE: 1.019 (ref 1.005–1.030)
Urobilinogen, UA: 1 mg/dL (ref 0.0–1.0)
pH: 8.5 — ABNORMAL HIGH (ref 5.0–8.0)

## 2014-02-04 LAB — CBC
HCT: 29.9 % — ABNORMAL LOW (ref 39.0–52.0)
HEMOGLOBIN: 9.5 g/dL — AB (ref 13.0–17.0)
MCH: 28.6 pg (ref 26.0–34.0)
MCHC: 31.8 g/dL (ref 30.0–36.0)
MCV: 90.1 fL (ref 78.0–100.0)
PLATELETS: 176 10*3/uL (ref 150–400)
RBC: 3.32 MIL/uL — ABNORMAL LOW (ref 4.22–5.81)
RDW: 15.6 % — ABNORMAL HIGH (ref 11.5–15.5)
WBC: 10.3 10*3/uL (ref 4.0–10.5)

## 2014-02-04 LAB — URINE MICROSCOPIC-ADD ON

## 2014-02-04 NOTE — Progress Notes (Signed)
2 Days Post-Op  Subjective: No flatus or BM. Could not void.   Objective: Vital signs in last 24 hours: Temp:  [97.7 F (36.5 C)-100.1 F (37.8 C)] 98.2 F (36.8 C) (04/17 0639) Pulse Rate:  [86-102] 86 (04/17 0639) Resp:  [14-24] 19 (04/17 0731) BP: (98-115)/(60-70) 98/60 mmHg (04/17 0639) SpO2:  [95 %-100 %] 95 % (04/17 0731) Last BM Date: 02/01/14  Intake/Output from previous day: 04/16 0701 - 04/17 0700 In: 4316.2 [I.V.:4316.2] Out: 1725 [Urine:975; Emesis/NG output:750] Intake/Output this shift:    Resp: clear to auscultation bilaterally Cardio: regular rate and rhythm, S1, S2 normal, no murmur, click, rub or gallop Incision/Wound:C/D/I occasionaL BS  Soft   Lab Results:   Recent Labs  02/03/14 0551 02/04/14 0600  WBC 13.6* 10.3  HGB 11.8* 9.5*  HCT 35.7* 29.9*  PLT 236 176   BMET  Recent Labs  02/03/14 0551 02/04/14 0600  NA 143 144  K 3.8 3.5*  CL 103 106  CO2 26 29  GLUCOSE 120* 116*  BUN 13 15  CREATININE 0.92 1.11  CALCIUM 8.4 8.6   PT/INR No results found for this basename: LABPROT, INR,  in the last 72 hours ABG No results found for this basename: PHART, PCO2, PO2, HCO3,  in the last 72 hours  Studies/Results: No results found.  Anti-infectives: Anti-infectives   Start     Dose/Rate Route Frequency Ordered Stop   02/02/14 2000  cefOXitin (MEFOXIN) 1 g in dextrose 5 % 50 mL IVPB     1 g 100 mL/hr over 30 Minutes Intravenous 4 times per day 02/02/14 1838 02/02/14 2113   02/02/14 0600  cefoTEtan (CEFOTAN) 2 g in dextrose 5 % 50 mL IVPB     2 g 100 mL/hr over 30 Minutes Intravenous On call to O.R. 02/01/14 1442 02/02/14 1338   02/01/14 1442  neomycin (MYCIFRADIN) tablet 1,000 mg  Status:  Discontinued     1,000 mg Oral 3 times per day 02/01/14 1442 02/02/14 1838   02/01/14 1442  erythromycin (E-MYCIN) tablet 1,000 mg  Status:  Discontinued     1,000 mg Oral 3 times per day 02/01/14 1442 02/02/14 1838      Assessment/Plan: s/p  Procedure(s): TAKE DOWN OF ENTEROCUTANEOUS  FISTULA (N/A) Await the return of bowel function Urinary retention  Keep foley for today and D/C in am   LOS: 2 days    Jhoel Stieg A. Charle Mclaurin 02/04/2014

## 2014-02-04 NOTE — Progress Notes (Signed)
[facility]  Patient Status: Active (receiving services up to time of hospitalization)  [facility] is providing the following services: RN - home TNA  If patient discharges after hours, please call [phone].   Eduardo [name] 02/04/2014, 4:14 PM

## 2014-02-04 NOTE — Progress Notes (Signed)
Dr. Brantley Stage notified of patient fever 101.5 axillary.  Orders received.

## 2014-02-05 LAB — URINE CULTURE
Colony Count: NO GROWTH
Culture: NO GROWTH
SPECIAL REQUESTS: NORMAL

## 2014-02-05 LAB — CBC
HCT: 29.1 % — ABNORMAL LOW (ref 39.0–52.0)
Hemoglobin: 9.4 g/dL — ABNORMAL LOW (ref 13.0–17.0)
MCH: 28.6 pg (ref 26.0–34.0)
MCHC: 32.3 g/dL (ref 30.0–36.0)
MCV: 88.4 fL (ref 78.0–100.0)
PLATELETS: 163 10*3/uL (ref 150–400)
RBC: 3.29 MIL/uL — ABNORMAL LOW (ref 4.22–5.81)
RDW: 14.8 % (ref 11.5–15.5)
WBC: 8.4 10*3/uL (ref 4.0–10.5)

## 2014-02-05 LAB — COMPREHENSIVE METABOLIC PANEL
ALT: 14 U/L (ref 0–53)
AST: 20 U/L (ref 0–37)
Albumin: 2.5 g/dL — ABNORMAL LOW (ref 3.5–5.2)
Alkaline Phosphatase: 52 U/L (ref 39–117)
BUN: 16 mg/dL (ref 6–23)
CALCIUM: 8.8 mg/dL (ref 8.4–10.5)
CO2: 29 mEq/L (ref 19–32)
Chloride: 104 mEq/L (ref 96–112)
Creatinine, Ser: 1 mg/dL (ref 0.50–1.35)
GFR calc Af Amer: 89 mL/min — ABNORMAL LOW (ref >90)
GFR calc non Af Amer: 77 mL/min — ABNORMAL LOW (ref >90)
Glucose, Bld: 116 mg/dL — ABNORMAL HIGH (ref 70–99)
Potassium: 3.4 mEq/L — ABNORMAL LOW (ref 3.7–5.3)
SODIUM: 143 meq/L (ref 137–147)
TOTAL PROTEIN: 6.6 g/dL (ref 6.0–8.3)
Total Bilirubin: 0.7 mg/dL (ref 0.3–1.2)

## 2014-02-05 MED ORDER — METHOCARBAMOL 100 MG/ML IJ SOLN
1000.0000 mg | Freq: Three times a day (TID) | INTRAVENOUS | Status: DC | PRN
  Filled 2014-02-05: qty 10

## 2014-02-05 MED ORDER — NYSTATIN 100000 UNIT/ML MT SUSP
5.0000 mL | Freq: Four times a day (QID) | OROMUCOSAL | Status: AC
  Administered 2014-02-05 – 2014-02-07 (×7): 500000 [IU] via ORAL
  Filled 2014-02-05 (×8): qty 5

## 2014-02-05 MED ORDER — TAMSULOSIN HCL 0.4 MG PO CAPS: 0.4000 mg | ORAL_CAPSULE | Freq: Every day | ORAL | Status: DC

## 2014-02-05 MED ORDER — FENTANYL CITRATE 0.05 MG/ML IJ SOLN
25.0000 ug | INTRAMUSCULAR | Status: DC | PRN
  Administered 2014-02-05 – 2014-02-06 (×7): 50 ug via INTRAVENOUS
  Filled 2014-02-05 (×7): qty 2

## 2014-02-05 MED ORDER — ACETAMINOPHEN 10 MG/ML IV SOLN
1000.0000 mg | Freq: Four times a day (QID) | INTRAVENOUS | Status: AC
  Administered 2014-02-05 – 2014-02-06 (×4): 1000 mg via INTRAVENOUS
  Filled 2014-02-05 (×4): qty 100

## 2014-02-05 NOTE — Progress Notes (Signed)
Pt. Ambulated around unit 515ft. And tolerated well.  Will continue to monitor. Syliva Overman

## 2014-02-05 NOTE — Progress Notes (Signed)
Sitting in chair. No flatus. Pain ok. Thrush on tongue.  >1L in ng   Alert, nad cta  Reg Soft, mild distension. hypoBs  Cont NG tube.  Await return of bowel funciton Voiding trial Sunday  Eduardo [name]. Redmond Pulling, MD, FACS General, Bariatric, & Minimally Invasive Surgery [facility], [place]

## 2014-02-05 NOTE — Progress Notes (Signed)
3 Days Post-Op  Subjective: Pt feels very nauseated after administration of dilaudid.  Pain is not significant.  NG putting out greenish/yellow output 1040mL/24 hours.  Using IS up to 1000 today.  Not ambulated much.     Objective: Vital signs in last 24 hours: Temp:  [98.2 F (36.8 C)-101.5 F (38.6 C)] 98.3 F (36.8 C) (04/18 0747) Pulse Rate:  [79-98] 80 (04/18 0747) Resp:  [14-24] 14 (04/18 0747) BP: (97-112)/(57-73) 112/73 mmHg (04/18 0747) SpO2:  [93 %-98 %] 98 % (04/18 0747) Last BM Date: 02/01/14  Intake/Output from previous day: 04/17 0701 - 04/18 0700 In: 1955 [P.O.:480; I.V.:1475] Out: 1950 [Urine:900; Emesis/NG output:1050] Intake/Output this shift:    PE: Gen:  Alert, NAD, pleasant Card:  RRR, no M/G/R heard Pulm:  CTA, no W/R/R, good effort, IS to 1000 Abd: Soft, mild tenderness, ND, great BS, no HSM, incisions with sanguinous drainage on the honeycomb dressing, but closed with staples Ext:  No erythema, edema, or tenderness   Lab Results:   Recent Labs  02/03/14 0551 02/04/14 0600  WBC 13.6* 10.3  HGB 11.8* 9.5*  HCT 35.7* 29.9*  PLT 236 176   BMET  Recent Labs  02/03/14 0551 02/04/14 0600  NA 143 144  K 3.8 3.5*  CL 103 106  CO2 26 29  GLUCOSE 120* 116*  BUN 13 15  CREATININE 0.92 1.11  CALCIUM 8.4 8.6   PT/INR No results found for this basename: LABPROT, INR,  in the last 72 hours CMP     Component Value Date/Time   NA 144 02/04/2014 0600   K 3.5* 02/04/2014 0600   CL 106 02/04/2014 0600   CO2 29 02/04/2014 0600   GLUCOSE 116* 02/04/2014 0600   BUN 15 02/04/2014 0600   CREATININE 1.11 02/04/2014 0600   CALCIUM 8.6 02/04/2014 0600   PROT 6.9 11/11/2013 0545   ALBUMIN 2.5* 11/11/2013 0545   AST 31 11/11/2013 0545   ALT 55* 11/11/2013 0545   ALKPHOS 82 11/11/2013 0545   BILITOT 0.2* 11/11/2013 0545   GFRNONAA 68* 02/04/2014 0600   GFRAA 79* 02/04/2014 0600   Lipase     Component Value Date/Time   LIPASE 31 10/27/2013 1915        Studies/Results: Dg Chest Port 1 View  02/04/2014   CLINICAL DATA:  Fever.  Colon surgery yesterday.  EXAM: PORTABLE CHEST - 1 VIEW  COMPARISON:  DG CHEST 2 VIEW dated 12/14/2013  FINDINGS: Nasogastric tube tip: Stomach body. Right-sided PICC line tip: Upper SVC.  Low lung volumes are present, causing crowding of the pulmonary vasculature. Borderline elevated right hemidiaphragm. Mild atelectasis of both lung bases.  Cardiac and mediastinal margins appear normal.  IMPRESSION: 1. Mild atelectasis at both lung bases. Borderline elevation of the right hemidiaphragm.   Electronically Signed   By: Sherryl Barters M.D.   On: 02/04/2014 16:05    Anti-infectives: Anti-infectives   Start     Dose/Rate Route Frequency Ordered Stop   02/02/14 2000  cefOXitin (MEFOXIN) 1 g in dextrose 5 % 50 mL IVPB     1 g 100 mL/hr over 30 Minutes Intravenous 4 times per day 02/02/14 1838 02/02/14 2113   02/02/14 0600  cefoTEtan (CEFOTAN) 2 g in dextrose 5 % 50 mL IVPB     2 g 100 mL/hr over 30 Minutes Intravenous On call to O.R. 02/01/14 1442 02/02/14 1338   02/01/14 1442  neomycin (MYCIFRADIN) tablet 1,000 mg  Status:  Discontinued  1,000 mg Oral 3 times per day 02/01/14 1442 02/02/14 1838   02/01/14 1442  erythromycin (E-MYCIN) tablet 1,000 mg  Status:  Discontinued     1,000 mg Oral 3 times per day 02/01/14 1442 02/02/14 1838       Assessment/Plan Stage III colon cancer EC fistula s/p right hemicolectomy POD #3 s/p take down of EC fistula Urinary retention   Plan: 1.  Great BS but no flatus or BM yet, NG with high output (1077mL/24hour), await bowel function 2.  Maintain foley today and try voiding trial tomorrow, hopefully with less narcotic this will resolve 3.  Ambulate and IS 4.  SCD's and lovenox 5.  D/c PCA due to hallucinations, start IV push fentanyl, with IV tylenol and baclofen to avoid having to use narcotics     LOS: 3 days    Coralie Keens 02/05/2014, 8:22 AM Pager:  [phone]

## 2014-02-05 NOTE — Progress Notes (Signed)
Pt. Ambulated up to bathroom and then sat in chair for about an hour.  Pt. Tolerated well.  Now back in bed and sleeping.  Will continue to monitor. Syliva Overman

## 2014-02-06 LAB — BASIC METABOLIC PANEL
BUN: 17 mg/dL (ref 6–23)
CO2: 29 meq/L (ref 19–32)
Calcium: 8.6 mg/dL (ref 8.4–10.5)
Chloride: 107 mEq/L (ref 96–112)
Creatinine, Ser: 0.83 mg/dL (ref 0.50–1.35)
GFR calc Af Amer: >90 mL/min (ref >90)
Glucose, Bld: 101 mg/dL — ABNORMAL HIGH (ref 70–99)
Potassium: 3.1 mEq/L — ABNORMAL LOW (ref 3.7–5.3)
Sodium: 147 mEq/L (ref 137–147)

## 2014-02-06 LAB — CBC
HCT: 26.5 % — ABNORMAL LOW (ref 39.0–52.0)
HEMOGLOBIN: 8.7 g/dL — AB (ref 13.0–17.0)
MCH: 28.2 pg (ref 26.0–34.0)
MCHC: 32.8 g/dL (ref 30.0–36.0)
MCV: 86 fL (ref 78.0–100.0)
Platelets: 200 10*3/uL (ref 150–400)
RBC: 3.08 MIL/uL — ABNORMAL LOW (ref 4.22–5.81)
RDW: 14.3 % (ref 11.5–15.5)
WBC: 7.3 10*3/uL (ref 4.0–10.5)

## 2014-02-06 MED ORDER — FENTANYL CITRATE 0.05 MG/ML IJ SOLN
50.0000 ug | INTRAMUSCULAR | Status: DC | PRN
  Administered 2014-02-06 (×7): 100 ug via INTRAVENOUS
  Filled 2014-02-06 (×7): qty 2

## 2014-02-06 MED ORDER — ACETAMINOPHEN 10 MG/ML IV SOLN
1000.0000 mg | Freq: Four times a day (QID) | INTRAVENOUS | Status: AC
  Administered 2014-02-06 – 2014-02-07 (×4): 1000 mg via INTRAVENOUS
  Filled 2014-02-06 (×4): qty 100

## 2014-02-06 MED ORDER — PANTOPRAZOLE SODIUM 40 MG IV SOLR
40.0000 mg | Freq: Two times a day (BID) | INTRAVENOUS | Status: DC
  Administered 2014-02-06 – 2014-02-09 (×7): 40 mg via INTRAVENOUS
  Filled 2014-02-06 (×9): qty 40

## 2014-02-06 MED ORDER — HYDROMORPHONE HCL PF 1 MG/ML IJ SOLN
1.0000 mg | INTRAMUSCULAR | Status: DC | PRN
  Administered 2014-02-06 – 2014-02-07 (×2): 1 mg via INTRAVENOUS
  Administered 2014-02-07: 2 mg via INTRAVENOUS
  Filled 2014-02-06: qty 2
  Filled 2014-02-06 (×2): qty 1
  Filled 2014-02-06: qty 2
  Filled 2014-02-06: qty 1

## 2014-02-06 MED ORDER — POTASSIUM CHLORIDE 10 MEQ/100ML IV SOLN
10.0000 meq | INTRAVENOUS | Status: AC
  Administered 2014-02-06 (×2): 10 meq via INTRAVENOUS
  Filled 2014-02-06 (×2): qty 100

## 2014-02-06 MED ORDER — PHENOL 1.4 % MT LIQD
1.0000 | OROMUCOSAL | Status: DC | PRN
  Filled 2014-02-06: qty 177

## 2014-02-06 NOTE — Progress Notes (Signed)
Foley removed at 0630 by night RN.  Pt. Attempted to void but said he "didn't feel like I had to."  Bladder scanned to get 404cc.  MD made aware.  MD gave orders to wait an hour to try again and if still cannot void, replace foley.  Will continue to monitor. Syliva Overman

## 2014-02-06 NOTE — Progress Notes (Signed)
Frequently irrig. NGT,lg. amt.clots aspirated.Marland Kitchen

## 2014-02-06 NOTE — Progress Notes (Signed)
Pt. Ambulated approximately 355ft in hallway and tolerated well.  Will continue to monitor. Syliva Overman

## 2014-02-06 NOTE — Progress Notes (Signed)
Upon entering pt.'s room I noticed bright red blood in his NG tube. VSS.  PA made aware.  Wall suction also appeared to be on max suction. I clamped his NG and had the CN, Sharee Pimple come look at the tube with me. The tube had a long blood clot in it.  I irrigated the tube and was able to remove the clot.  The tube is now working correctly on low-intermittent wall suction and is draining a brown liquid with what appears to be some old blood.  Pt. Has been given pain medicine and has no new complaints.  Will continue to monitor. Syliva Overman

## 2014-02-06 NOTE — Progress Notes (Addendum)
Flat affect today. +BMs but 800cc/24hrs ng tube output -dark green  Alert, nad cta  Reg Soft, approp TTP, incision ok. +BS  Will cont NG tube to LIWS given this is second surgery and hi volume bilious output. Hopefully clamp and removed tomorrow O/w agree with PA note Replace potassium Nurse reports pt hasnt void yet since foley removed. May need to be replaced. Will have PA followup on bladder scan  Leighton Ruff. Redmond Pulling, MD, FACS General, Bariatric, & Minimally Invasive Surgery [facility], [place]

## 2014-02-06 NOTE — Progress Notes (Signed)
4 Days Post-Op  Subjective: Pt's pain isn't well controlled, but he's not actually getting a dose every 2 hours.  Pt denies N/V.  Not hungry and isn't eating much ice.  Ambulating some in the halls.  Using IS.  Objective: Vital signs in last 24 hours: Temp:  [97.7 F (36.5 C)-98.9 F (37.2 C)] 98.2 F (36.8 C) (04/19 0520) Pulse Rate:  [74-91] 74 (04/19 0520) Resp:  [14-18] 18 (04/19 0520) BP: (120-147)/(73-79) 147/73 mmHg (04/19 0520) SpO2:  [95 %-97 %] 97 % (04/19 0520) Last BM Date: 02/06/14  Intake/Output from previous day: 04/18 0701 - 04/19 0700 In: 3718.1 [I.V.:3718.1] Out: 2100 [Urine:1300; Emesis/NG output:800] Intake/Output this shift:    PE: Gen:  Alert, NAD, pleasant Abd: Soft, NT/ND, +BS, no HSM, incisions C/D/I, drain with minimal sanguinous drainage, no abdominal scars noted   Lab Results:   Recent Labs  02/04/14 0600 02/05/14 1100  WBC 10.3 8.4  HGB 9.5* 9.4*  HCT 29.9* 29.1*  PLT 176 163   BMET  Recent Labs  02/04/14 0600 02/05/14 1100  NA 144 143  K 3.5* 3.4*  CL 106 104  CO2 29 29  GLUCOSE 116* 116*  BUN 15 16  CREATININE 1.11 1.00  CALCIUM 8.6 8.8   PT/INR No results found for this basename: LABPROT, INR,  in the last 72 hours CMP     Component Value Date/Time   NA 143 02/05/2014 1100   K 3.4* 02/05/2014 1100   CL 104 02/05/2014 1100   CO2 29 02/05/2014 1100   GLUCOSE 116* 02/05/2014 1100   BUN 16 02/05/2014 1100   CREATININE 1.00 02/05/2014 1100   CALCIUM 8.8 02/05/2014 1100   PROT 6.6 02/05/2014 1100   ALBUMIN 2.5* 02/05/2014 1100   AST 20 02/05/2014 1100   ALT 14 02/05/2014 1100   ALKPHOS 52 02/05/2014 1100   BILITOT 0.7 02/05/2014 1100   GFRNONAA 77* 02/05/2014 1100   GFRAA 89* 02/05/2014 1100   Lipase     Component Value Date/Time   LIPASE 31 10/27/2013 1915       Studies/Results: Dg Chest Port 1 View  02/04/2014   CLINICAL DATA:  Fever.  Colon surgery yesterday.  EXAM: PORTABLE CHEST - 1 VIEW  COMPARISON:  DG CHEST 2 VIEW  dated 12/14/2013  FINDINGS: Nasogastric tube tip: Stomach body. Right-sided PICC line tip: Upper SVC.  Low lung volumes are present, causing crowding of the pulmonary vasculature. Borderline elevated right hemidiaphragm. Mild atelectasis of both lung bases.  Cardiac and mediastinal margins appear normal.  IMPRESSION: 1. Mild atelectasis at both lung bases. Borderline elevation of the right hemidiaphragm.   Electronically Signed   By: Sherryl Barters M.D.   On: 02/04/2014 16:05    Anti-infectives: Anti-infectives   Start     Dose/Rate Route Frequency Ordered Stop   02/02/14 2000  cefOXitin (MEFOXIN) 1 g in dextrose 5 % 50 mL IVPB     1 g 100 mL/hr over 30 Minutes Intravenous 4 times per day 02/02/14 1838 02/02/14 2113   02/02/14 0600  cefoTEtan (CEFOTAN) 2 g in dextrose 5 % 50 mL IVPB     2 g 100 mL/hr over 30 Minutes Intravenous On call to O.R. 02/01/14 1442 02/02/14 1338   02/01/14 1442  neomycin (MYCIFRADIN) tablet 1,000 mg  Status:  Discontinued     1,000 mg Oral 3 times per day 02/01/14 1442 02/02/14 1838   02/01/14 1442  erythromycin (E-MYCIN) tablet 1,000 mg  Status:  Discontinued  1,000 mg Oral 3 times per day 02/01/14 1442 02/02/14 1838       Assessment/Plan Stage III colon cancer  EC fistula s/p right hemicolectomy POD #3 s/p take down of EC fistula  Urinary retention   Plan:  1. NG with less output today (837mL/24hour) 2. D/c foley, voiding trial 3. Ambulate and IS  4. SCD's and lovenox  5. D/c PCA due to hallucinations, start IV push fentanyl will increase dosage to see if we can get better control, IV tylenol and baclofen to avoid having to use narcotics 6.  Had 2 BM's, but will hold off on clamping trails due to his NG output being so high, may be able to try tomorrow 7.  Remove honeycomb dressing, replace with dry dressing as needed    LOS: 4 days    Coralie Keens 02/06/2014, 7:51 AM Pager: [phone]

## 2014-02-06 NOTE — Progress Notes (Signed)
Pt. Ambulated in room and tolerated well.  Pt. States he will try and walk in the hall some tonight.  Will continue to monitor. Syliva Overman

## 2014-02-06 NOTE — Progress Notes (Signed)
Pt. Unable to void after two tries. Urinary catheter placed per verbal MD order and 600cc urine immediately returned. Will continue to monitor. Syliva Overman

## 2014-02-07 ENCOUNTER — Encounter (INDEPENDENT_AMBULATORY_CARE_PROVIDER_SITE_OTHER): Payer: Self-pay

## 2014-02-07 ENCOUNTER — Telehealth (INDEPENDENT_AMBULATORY_CARE_PROVIDER_SITE_OTHER): Payer: Self-pay

## 2014-02-07 DIAGNOSIS — R339 Retention of urine, unspecified: Secondary | ICD-10-CM | POA: Diagnosis not present

## 2014-02-07 LAB — CBC
HCT: 25.1 % — ABNORMAL LOW (ref 39.0–52.0)
Hemoglobin: 8.3 g/dL — ABNORMAL LOW (ref 13.0–17.0)
MCH: 28.4 pg (ref 26.0–34.0)
MCHC: 33.1 g/dL (ref 30.0–36.0)
MCV: 86 fL (ref 78.0–100.0)
Platelets: 204 10*3/uL (ref 150–400)
RBC: 2.92 MIL/uL — ABNORMAL LOW (ref 4.22–5.81)
RDW: 14.4 % (ref 11.5–15.5)
WBC: 7.5 10*3/uL (ref 4.0–10.5)

## 2014-02-07 LAB — BASIC METABOLIC PANEL
BUN: 15 mg/dL (ref 6–23)
CO2: 28 mEq/L (ref 19–32)
Calcium: 8.6 mg/dL (ref 8.4–10.5)
Chloride: 108 mEq/L (ref 96–112)
Creatinine, Ser: 0.79 mg/dL (ref 0.50–1.35)
GFR calc Af Amer: >90 mL/min (ref >90)
Glucose, Bld: 106 mg/dL — ABNORMAL HIGH (ref 70–99)
Potassium: 3.1 mEq/L — ABNORMAL LOW (ref 3.7–5.3)
SODIUM: 145 meq/L (ref 137–147)

## 2014-02-07 MED ORDER — FENTANYL CITRATE 0.05 MG/ML IJ SOLN
50.0000 ug | INTRAMUSCULAR | Status: DC | PRN
  Administered 2014-02-07 – 2014-02-08 (×9): 100 ug via INTRAVENOUS
  Administered 2014-02-09: 50 ug via INTRAVENOUS
  Filled 2014-02-07 (×10): qty 2

## 2014-02-07 MED ORDER — POTASSIUM CHLORIDE 10 MEQ/100ML IV SOLN
10.0000 meq | INTRAVENOUS | Status: AC
  Administered 2014-02-07 (×3): 10 meq via INTRAVENOUS
  Filled 2014-02-07: qty 100

## 2014-02-07 MED ORDER — METOCLOPRAMIDE HCL 5 MG/ML IJ SOLN
10.0000 mg | Freq: Four times a day (QID) | INTRAMUSCULAR | Status: DC
  Administered 2014-02-07 – 2014-02-08 (×5): 10 mg via INTRAVENOUS
  Filled 2014-02-07 (×9): qty 2

## 2014-02-07 MED ORDER — ACETAMINOPHEN 10 MG/ML IV SOLN
1000.0000 mg | Freq: Four times a day (QID) | INTRAVENOUS | Status: AC
  Administered 2014-02-07 – 2014-02-08 (×4): 1000 mg via INTRAVENOUS
  Filled 2014-02-07 (×4): qty 100

## 2014-02-07 MED ORDER — DIPHENHYDRAMINE HCL 50 MG/ML IJ SOLN
25.0000 mg | Freq: Every evening | INTRAMUSCULAR | Status: DC | PRN
  Administered 2014-02-07: 25 mg via INTRAVENOUS
  Filled 2014-02-07: qty 1

## 2014-02-07 MED ORDER — TAMSULOSIN HCL 0.4 MG PO CAPS
0.4000 mg | ORAL_CAPSULE | Freq: Every day | ORAL | Status: DC
  Administered 2014-02-07 – 2014-02-10 (×4): 0.4 mg via ORAL
  Filled 2014-02-07 (×5): qty 1

## 2014-02-07 NOTE — Telephone Encounter (Signed)
Message copied by Carlene Coria on Mon Feb 07, 2014  9:32 AM ------      Message from: Erroll Luna A      Created: Mon Feb 07, 2014  7:27 AM       Needs inpatient urology consult for urinary retention POD 5 fistula takedown at [facility] 22.      THX      TC ------

## 2014-02-07 NOTE — Progress Notes (Signed)
5 Days Post-Op  Subjective: FEELS WEAK. HAS HAD 3 BM  Objective: Vital signs in last 24 hours: Temp:  [97.8 F (36.6 C)-98.3 F (36.8 C)] 98.3 F (36.8 C) (04/20 0207) Pulse Rate:  [70-75] 75 (04/20 0207) Resp:  [16-19] 16 (04/20 0207) BP: (116-135)/(62-77) 116/62 mmHg (04/20 0207) SpO2:  [94 %-97 %] 97 % (04/20 0207) Last BM Date: 02/06/14  Intake/Output from previous day: 04/19 0701 - 04/20 0700 In: 1440 [I.V.:1440] Out: 3200 [Urine:2350; Emesis/NG output:850] Intake/Output this shift:    General appearance: fatigued Incision/Wound:C/D/I incision.  Soft ND  Lab Results:   Recent Labs  02/05/14 1100 02/06/14 1128  WBC 8.4 7.3  HGB 9.4* 8.7*  HCT 29.1* 26.5*  PLT 163 200   BMET  Recent Labs  02/05/14 1100 02/06/14 1128  NA 143 147  K 3.4* 3.1*  CL 104 107  CO2 29 29  GLUCOSE 116* 101*  BUN 16 17  CREATININE 1.00 0.83  CALCIUM 8.8 8.6   PT/INR No results found for this basename: LABPROT, INR,  in the last 72 hours ABG No results found for this basename: PHART, PCO2, PO2, HCO3,  in the last 72 hours  Studies/Results: No results found.  Anti-infectives: Anti-infectives   Start     Dose/Rate Route Frequency Ordered Stop   02/02/14 2000  cefOXitin (MEFOXIN) 1 g in dextrose 5 % 50 mL IVPB     1 g 100 mL/hr over 30 Minutes Intravenous 4 times per day 02/02/14 1838 02/02/14 2113   02/02/14 0600  cefoTEtan (CEFOTAN) 2 g in dextrose 5 % 50 mL IVPB     2 g 100 mL/hr over 30 Minutes Intravenous On call to O.R. 02/01/14 1442 02/02/14 1338   02/01/14 1442  neomycin (MYCIFRADIN) tablet 1,000 mg  Status:  Discontinued     1,000 mg Oral 3 times per day 02/01/14 1442 02/02/14 1838   02/01/14 1442  erythromycin (E-MYCIN) tablet 1,000 mg  Status:  Discontinued     1,000 mg Oral 3 times per day 02/01/14 1442 02/02/14 1838      Assessment/Plan: s/p Procedure(s): TAKE DOWN OF ENTEROCUTANEOUS  FISTULA (N/A) URINARY RETENTION  ADD FLOMAX AND WILL CONSULT  UROLOGY D.C NGT OOB HOPEFULLY START DIET Tuesday IF NOT WILL NEED TNA REPLACE K ANEMIA COMBINATION OF NO PO INTAKE AND SOME NGT IRRITATION.   LOS: 5 days    Javelle Donigan A. Lacreasha Hinds 02/07/2014

## 2014-02-07 NOTE — Consult Note (Signed)
5 Days Post-Op  Subjective: I was asked to see Eduardo [name] by Dr. Brantley Stage for postop urinary retention.   Eduardo [name] had repair of an enterocutaneous fistula on 4/15.   His foley was initially removed on 4/19 but he was unable to void.  The foley was replaced and another attempt was made today also without success and the foley was replaced with a PVR of 600cc.  He has subsequently been given tamsulosin and has received one dose.   He reports that prior to surgery he had no voiding complaints or prior GU history other than a transient rise in his PSA a few years ago for which he was seen by urology in [place].  He did have nocturia x 5-6 prior to surgery but attributed that to increased fluids from the TPN.  He has no prior history of post operative voiding difficulty. ROS:  Review of Systems  Constitutional: Positive for malaise/fatigue. Negative for fever and chills.  Gastrointestinal: Positive for abdominal pain.  Genitourinary:       None prior to this episode.  Neurological: Positive for tingling (in feet with prolonged standing. ).  All other systems reviewed and are negative.   Allergies  Allergen Reactions  . Niaspan [Niacin Er] Rash    Past Medical History  Diagnosis Date  . Arthritis   . DDD (degenerative disc disease), cervical   . Wears glasses   . Neuropathy 2010  . History of seasonal allergies   . Headache(784.0)   . GERD (gastroesophageal reflux disease)   . Constipation   . Colon cancer 10/20/13    colon resection. Skin cancer removed from face    Past Surgical History  Procedure Laterality Date  . Hemorrhoid surgery    . Carpal tunnel release  2011    left  . Knee arthroscopy  2001    rt and lt  . Upper gi endoscopy    . Colonoscopy    . Inguinal hernia repair      right ([place])  . Tonsillectomy    . Carpal tunnel release Right 01/07/2013    Procedure: CARPAL TUNNEL RELEASE;  Surgeon: Tennis Must, MD;  Location: [facility];   Service: Orthopedics;  Laterality: Right;  . Colonoscopy N/A 10/18/2013    Procedure: COLONOSCOPY;  Surgeon: Danie Binder, MD;  [facility]: [facility];  Service: Endoscopy;  Laterality: N/A;  2:00-moved to 1030 Pam to notify pt  . Colon resection N/A 10/20/2013    Procedure:  LAPAROSCOPIC hand assisted partial colectomy;  Surgeon: Jamesetta So, MD;  Location: [facility];  Service: General;  Laterality: N/A;  . Appendectomy  10/20/13    (with right hemicolectomy)  . Inguinal hernia repair Bilateral     left x2 ([place], and one at [facility])  . Take down of intestinal fistula N/A 02/02/2014    Procedure: TAKE DOWN OF ENTEROCUTANEOUS  FISTULA;  Surgeon: Marcello Moores A. Cornett, MD;  Location: [facility];  Service: General;  Laterality: N/A;    History   Social History  . Marital Status: Married    Spouse Name: N/A    Number of Children: N/A  . Years of Education: N/A   Occupational History  . Not on file.   Social History Main Topics  . Smoking status: Never Smoker   . Smokeless tobacco: Not on file  . Alcohol Use: No  . Drug Use: No  . Sexual Activity: Yes    Birth Control/ Protection: None  Other Topics Concern  . Not on file   Social History Narrative  . No narrative on file    Family History  Problem Relation Age of Onset  . Colon polyps Father   . Colon polyps Paternal Uncle   . Colon polyps Maternal Grandfather     Anti-infectives: Anti-infectives   Start     Dose/Rate Route Frequency Ordered Stop   02/02/14 2000  cefOXitin (MEFOXIN) 1 g in dextrose 5 % 50 mL IVPB     1 g 100 mL/hr over 30 Minutes Intravenous 4 times per day 02/02/14 1838 02/02/14 2113   02/02/14 0600  cefoTEtan (CEFOTAN) 2 g in dextrose 5 % 50 mL IVPB     2 g 100 mL/hr over 30 Minutes Intravenous On call to O.R. 02/01/14 1442 02/02/14 1338   02/01/14 1442  neomycin (MYCIFRADIN) tablet 1,000 mg  Status:  Discontinued     1,000 mg Oral 3 times per day 02/01/14 1442 02/02/14 1838   02/01/14 1442   erythromycin (E-MYCIN) tablet 1,000 mg  Status:  Discontinued     1,000 mg Oral 3 times per day 02/01/14 1442 02/02/14 1838      Current Facility-Administered Medications  Medication Dose Route Frequency Provider Last Rate Last Dose  . acetaminophen (OFIRMEV) IV 1,000 mg  1,000 mg Intravenous 4 times per day Megan Dort, PA-C   1,000 mg at 02/07/14 1733  . acetaminophen (TYLENOL) tablet 650 mg  650 mg Oral Q6H PRN Thomas A. Cornett, MD   650 mg at 02/04/14 1604  . albuterol (PROVENTIL) (2.5 MG/3ML) 0.083% nebulizer solution 3 mL  3 mL Inhalation Q6H PRN Thomas A. Cornett, MD      . baclofen (LIORESAL) 10 mg/mL oral suspension 5 mg  5 mg Per Tube TID Thomas A. Cornett, MD   5 mg at 02/07/14 2043  . dextrose 5 % in lactated ringers infusion   Intravenous Continuous Thomas A. Cornett, MD 100 mL/hr at 02/07/14 1744    . diphenhydrAMINE (BENADRYL) injection 25 mg  25 mg Intravenous QHS PRN Zenovia Jarred, MD      . enoxaparin (LOVENOX) injection 40 mg  40 mg Subcutaneous Q24H Thomas A. Cornett, MD   40 mg at 02/07/14 1735  . fentaNYL (SUBLIMAZE) injection 50-100 mcg  50-100 mcg Intravenous Q2H PRN Megan Dort, PA-C   100 mcg at 02/07/14 2043  . metoCLOPramide (REGLAN) injection 10 mg  10 mg Intravenous 4 times per day Thomas A. Cornett, MD   10 mg at 02/07/14 1735  . ondansetron (ZOFRAN) tablet 4 mg  4 mg Oral Q6H PRN Thomas A. Cornett, MD       Or  . ondansetron (ZOFRAN) injection 4 mg  4 mg Intravenous Q6H PRN Thomas A. Cornett, MD   4 mg at 02/07/14 0449  . pantoprazole (PROTONIX) injection 40 mg  40 mg Intravenous Q12H Megan Dort, PA-C   40 mg at 02/07/14 2042  . phenol (CHLORASEPTIC) mouth spray 1 spray  1 spray Mouth/Throat PRN Gwenyth Ober, MD      . pregabalin (LYRICA) capsule 75 mg  75 mg Oral QPM Thomas A. Cornett, MD      . sodium chloride 0.9 % injection 10-40 mL  10-40 mL Intracatheter PRN Thomas A. Cornett, MD   10 mL at 02/07/14 0656  . tamsulosin (FLOMAX) capsule 0.4 mg  0.4 mg  Oral Daily Thomas A. Cornett, MD   0.4 mg at 02/07/14 0954     Objective: Vital  signs in last 24 hours: Temp:  [98.1 F (36.7 C)-98.4 F (36.9 C)] 98.4 F (36.9 C) (04/20 1812) Pulse Rate:  [62-75] 62 (04/20 1812) Resp:  [16-19] 16 (04/20 1812) BP: (116-131)/(62-77) 129/77 mmHg (04/20 1812) SpO2:  [93 %-99 %] 95 % (04/20 1812)  Intake/Output from previous day: 04/19 0701 - 04/20 0700 In: 1440 [I.V.:1440] Out: 3200 [Urine:2350; Emesis/NG output:850] Intake/Output this shift:     Physical Exam  Constitutional: He is oriented to person, place, and time and well-developed, well-nourished, and in no distress. No distress.  HENT:  Head: Normocephalic and atraumatic.  Neck: Normal range of motion. Neck supple.  Cardiovascular: Normal rate and regular rhythm.   Pulmonary/Chest: Effort normal. No respiratory distress.  Abdominal: He exhibits no distension. There is tenderness (diffuse from recent surgery). There is guarding.  Genitourinary: Penis normal.  Uncircumcised with a foley in placed draining well.   Rectal exam was deferred as the patient was uncomfortable from his recent surgery and I didn't think the exam would add to management in the acute setting.   Musculoskeletal: Normal range of motion. He exhibits no edema.  Neurological: He is alert and oriented to person, place, and time.  Skin: Skin is warm and dry.  Psychiatric: Mood and affect normal.    Lab Results:   Recent Labs  02/06/14 1128 02/07/14 0656  WBC 7.3 7.5  HGB 8.7* 8.3*  HCT 26.5* 25.1*  PLT 200 204   BMET  Recent Labs  02/06/14 1128 02/07/14 0656  NA 147 145  K 3.1* 3.1*  CL 107 108  CO2 29 28  GLUCOSE 101* 106*  BUN 17 15  CREATININE 0.83 0.79  CALCIUM 8.6 8.6   PT/INR No results found for this basename: LABPROT, INR,  in the last 72 hours ABG No results found for this basename: PHART, PCO2, PO2, HCO3,  in the last 72 hours  Studies/Results: No results found.   Assessment: s/p  Procedure(s): TAKE DOWN OF ENTEROCUTANEOUS  FISTULA  Postoperative urinary retention.   Plan: Continue tamsulosin and repeat voiding trial in AM. If he fails tomorrow, he should be discharged home with the catheter and f/u with [place] in 1-2 weeks for another voiding trial.   CC: Dr. Erroll Luna.     LOS: 5 days    Irine Seal 02/07/2014

## 2014-02-07 NOTE — Telephone Encounter (Signed)
Spoke to [facility] with [facility]. She took pt information and will take care of consult.

## 2014-02-08 ENCOUNTER — Encounter (INDEPENDENT_AMBULATORY_CARE_PROVIDER_SITE_OTHER): Payer: Self-pay

## 2014-02-08 DIAGNOSIS — E876 Hypokalemia: Secondary | ICD-10-CM

## 2014-02-08 LAB — CBC
HEMATOCRIT: 24.1 % — AB (ref 39.0–52.0)
HEMOGLOBIN: 8.1 g/dL — AB (ref 13.0–17.0)
MCH: 28.4 pg (ref 26.0–34.0)
MCHC: 33.6 g/dL (ref 30.0–36.0)
MCV: 84.6 fL (ref 78.0–100.0)
Platelets: 195 10*3/uL (ref 150–400)
RBC: 2.85 MIL/uL — AB (ref 4.22–5.81)
RDW: 14.4 % (ref 11.5–15.5)
WBC: 7.3 10*3/uL (ref 4.0–10.5)

## 2014-02-08 MED ORDER — FENTANYL 50 MCG/HR TD PT72
75.0000 ug | MEDICATED_PATCH | TRANSDERMAL | Status: DC
  Administered 2014-02-08: 75 ug via TRANSDERMAL
  Filled 2014-02-08 (×2): qty 1

## 2014-02-08 MED ORDER — POTASSIUM CHLORIDE 10 MEQ/100ML IV SOLN
10.0000 meq | INTRAVENOUS | Status: AC
  Administered 2014-02-08 (×4): 10 meq via INTRAVENOUS
  Filled 2014-02-08 (×4): qty 100

## 2014-02-08 MED ORDER — METOCLOPRAMIDE HCL 10 MG PO TABS
10.0000 mg | ORAL_TABLET | Freq: Four times a day (QID) | ORAL | Status: DC | PRN
Start: 2014-02-08 — End: 2014-02-10

## 2014-02-08 MED ORDER — LORAZEPAM 1 MG PO TABS: 1.0000 mg | ORAL_TABLET | Freq: Four times a day (QID) | ORAL | Status: DC | PRN

## 2014-02-08 MED ORDER — OXYCODONE HCL 5 MG PO TABS
10.0000 mg | ORAL_TABLET | ORAL | Status: DC | PRN
  Administered 2014-02-10: 10 mg via ORAL
  Filled 2014-02-08: qty 2

## 2014-02-08 NOTE — Progress Notes (Signed)
16 french foley inserted with immediate return of 500 ccs yellow urine, patient with significant relief.

## 2014-02-08 NOTE — Progress Notes (Signed)
6 Days Post-Op  Subjective: Feels week multiple BM.   Objective: Vital signs in last 24 hours: Temp:  [98 F (36.7 C)-98.4 F (36.9 C)] 98.3 F (36.8 C) (04/21 0557) Pulse Rate:  [62-68] 68 (04/21 0557) Resp:  [16-20] 20 (04/21 0557) BP: (124-130)/(71-77) 127/71 mmHg (04/21 0557) SpO2:  [93 %-100 %] 95 % (04/21 0557) Last BM Date: 02/07/14  Intake/Output from previous day: 04/20 0701 - 04/21 0700 In: 7517.3 [I.V.:7317.3; IV Piggyback:200] Out: 7510 [CHENI:7782; Emesis/NG output:200] Intake/Output this shift:    General appearance: fatigued Incision/Wound:C/D/I no erythema ND sore  Lab Results:   Recent Labs  02/07/14 0656 02/08/14 0420  WBC 7.5 7.3  HGB 8.3* 8.1*  HCT 25.1* 24.1*  PLT 204 195   BMET  Recent Labs  02/06/14 1128 02/07/14 0656  NA 147 145  K 3.1* 3.1*  CL 107 108  CO2 29 28  GLUCOSE 101* 106*  BUN 17 15  CREATININE 0.83 0.79  CALCIUM 8.6 8.6   PT/INR No results found for this basename: LABPROT, INR,  in the last 72 hours ABG No results found for this basename: PHART, PCO2, PO2, HCO3,  in the last 72 hours  Studies/Results: No results found.  Anti-infectives: Anti-infectives   Start     Dose/Rate Route Frequency Ordered Stop   02/02/14 2000  cefOXitin (MEFOXIN) 1 g in dextrose 5 % 50 mL IVPB     1 g 100 mL/hr over 30 Minutes Intravenous 4 times per day 02/02/14 1838 02/02/14 2113   02/02/14 0600  cefoTEtan (CEFOTAN) 2 g in dextrose 5 % 50 mL IVPB     2 g 100 mL/hr over 30 Minutes Intravenous On call to O.R. 02/01/14 1442 02/02/14 1338   02/01/14 1442  neomycin (MYCIFRADIN) tablet 1,000 mg  Status:  Discontinued     1,000 mg Oral 3 times per day 02/01/14 1442 02/02/14 1838   02/01/14 1442  erythromycin (E-MYCIN) tablet 1,000 mg  Status:  Discontinued     1,000 mg Oral 3 times per day 02/01/14 1442 02/02/14 1838      Assessment/Plan: s/p Procedure(s): TAKE DOWN OF ENTEROCUTANEOUS  FISTULA (N/A) Urinary retention appreciate  urology help.  Voiding trial /flomax.  May need catheter. Adv diet Anemia acute on chronic  Stable Decrease IVF Add fentanyl patch for better pain control Ativan for anxiety.  Hypokalemia  replace   LOS: 6 days    Eduardo [name] 02/08/2014

## 2014-02-08 NOTE — Progress Notes (Signed)
Patient unable to void after going to bathroom to try, bladder scan performed registering greater than 381.  Call to Dr. Jeffie Pollock with notification of above, order received to replace foley.  Patient having significant pain and spasm at this time as well.  Has had two doses of Flomax.

## 2014-02-09 ENCOUNTER — Encounter (INDEPENDENT_AMBULATORY_CARE_PROVIDER_SITE_OTHER): Payer: Self-pay

## 2014-02-09 LAB — BASIC METABOLIC PANEL
BUN: 9 mg/dL (ref 6–23)
CHLORIDE: 103 meq/L (ref 96–112)
CO2: 23 mEq/L (ref 19–32)
CREATININE: 0.71 mg/dL (ref 0.50–1.35)
Calcium: 8.7 mg/dL (ref 8.4–10.5)
GFR calc non Af Amer: >90 mL/min (ref >90)
Glucose, Bld: 88 mg/dL (ref 70–99)
POTASSIUM: 2.8 meq/L — AB (ref 3.7–5.3)
Sodium: 141 mEq/L (ref 137–147)

## 2014-02-09 LAB — CREATININE, SERUM
Creatinine, Ser: 0.64 mg/dL (ref 0.50–1.35)
GFR calc Af Amer: >90 mL/min (ref >90)

## 2014-02-09 MED ORDER — SODIUM CHLORIDE 0.9 % IV SOLN: 250.0000 mL | INTRAVENOUS | Status: DC | PRN

## 2014-02-09 MED ORDER — POTASSIUM CHLORIDE 10 MEQ/100ML IV SOLN
10.0000 meq | INTRAVENOUS | Status: AC
  Administered 2014-02-09 (×4): 10 meq via INTRAVENOUS
  Filled 2014-02-09 (×4): qty 100

## 2014-02-09 MED ORDER — SODIUM CHLORIDE 0.9 % IJ SOLN: 3.0000 mL | INTRAMUSCULAR | Status: DC | PRN

## 2014-02-09 MED ORDER — POTASSIUM CHLORIDE CRYS ER 20 MEQ PO TBCR
40.0000 meq | EXTENDED_RELEASE_TABLET | Freq: Once | ORAL | Status: AC
  Administered 2014-02-09: 40 meq via ORAL
  Filled 2014-02-09: qty 2

## 2014-02-09 MED ORDER — SODIUM CHLORIDE 0.9 % IJ SOLN
3.0000 mL | Freq: Two times a day (BID) | INTRAMUSCULAR | Status: DC
  Administered 2014-02-09 (×2): 3 mL via INTRAVENOUS

## 2014-02-09 MED ORDER — PANTOPRAZOLE SODIUM 40 MG PO TBEC
40.0000 mg | DELAYED_RELEASE_TABLET | Freq: Every day | ORAL | Status: DC
  Administered 2014-02-10: 40 mg via ORAL
  Filled 2014-02-09: qty 1

## 2014-02-09 NOTE — Progress Notes (Signed)
Patient ID: Eduardo [name], male   DOB: 1946/01/28, 68 y.o.   MRN: [id number] I was not able to get back to see the patient yesterday and since the foley was replaced, he just needs to see me in the office in f/u in about 1-2 weeks.   He should stay on the tamsulosin.

## 2014-02-09 NOTE — Progress Notes (Signed)
7 Days Post-Op  Subjective: Feels ok.  Could not void and foley replaced.  Having BM.  Objective: Vital signs in last 24 hours: Temp:  [98.5 F (36.9 C)-99.3 F (37.4 C)] 98.5 F (36.9 C) (04/22 0440) Pulse Rate:  [70-89] 70 (04/22 0440) Resp:  [17-20] 18 (04/22 0440) BP: (134-141)/(47-79) 134/79 mmHg (04/22 0440) SpO2:  [97 %-99 %] 99 % (04/22 0440) Last BM Date: 02/07/14  Intake/Output from previous day: 04/21 0701 - 04/22 0700 In: 1916.7 [P.O.:360; I.V.:1156.7; IV Piggyback:400] Out: 1850 [Urine:1850] Intake/Output this shift:    Incision/Wound:C/D/I soft BS present min tender  Lab Results:   Recent Labs  02/07/14 0656 02/08/14 0420  WBC 7.5 7.3  HGB 8.3* 8.1*  HCT 25.1* 24.1*  PLT 204 195   BMET  Recent Labs  02/06/14 1128 02/07/14 0656 02/09/14 0556  NA 147 145  --   K 3.1* 3.1*  --   CL 107 108  --   CO2 29 28  --   GLUCOSE 101* 106*  --   BUN 17 15  --   CREATININE 0.83 0.79 0.64  CALCIUM 8.6 8.6  --    PT/INR No results found for this basename: LABPROT, INR,  in the last 72 hours ABG No results found for this basename: PHART, PCO2, PO2, HCO3,  in the last 72 hours  Studies/Results: No results found.  Anti-infectives: Anti-infectives   Start     Dose/Rate Route Frequency Ordered Stop   02/02/14 2000  cefOXitin (MEFOXIN) 1 g in dextrose 5 % 50 mL IVPB     1 g 100 mL/hr over 30 Minutes Intravenous 4 times per day 02/02/14 1838 02/02/14 2113   02/02/14 0600  cefoTEtan (CEFOTAN) 2 g in dextrose 5 % 50 mL IVPB     2 g 100 mL/hr over 30 Minutes Intravenous On call to O.R. 02/01/14 1442 02/02/14 1338   02/01/14 1442  neomycin (MYCIFRADIN) tablet 1,000 mg  Status:  Discontinued     1,000 mg Oral 3 times per day 02/01/14 1442 02/02/14 1838   02/01/14 1442  erythromycin (E-MYCIN) tablet 1,000 mg  Status:  Discontinued     1,000 mg Oral 3 times per day 02/01/14 1442 02/02/14 1838      Assessment/Plan: s/p Procedure(s): TAKE DOWN OF  ENTEROCUTANEOUS  FISTULA (N/A) Urinary retention  Will go home with foley Advance diet SL IV Recheck K level and replace as needed.   LOS: 7 days    Eduardo [name] A. Durante Violett 02/09/2014

## 2014-02-09 NOTE — Progress Notes (Signed)
CRITICAL VALUE ALERT  Critical value received:  Potassium: 2.8  Date of notification:  02/09/14  Time of notification:  6384  Critical value read back:yes  Nurse who received alert:  Kathrine Cords, RN  MD notified (1st page):  Dr. Brantley Stage  Time of first page:  30  MD notified (2nd page):   Time of second page:  Responding MD:  Dr. Brantley Stage  Time MD responded:  [phone]

## 2014-02-10 ENCOUNTER — Telehealth (INDEPENDENT_AMBULATORY_CARE_PROVIDER_SITE_OTHER): Payer: Self-pay

## 2014-02-10 ENCOUNTER — Other Ambulatory Visit (INDEPENDENT_AMBULATORY_CARE_PROVIDER_SITE_OTHER): Payer: Self-pay

## 2014-02-10 DIAGNOSIS — C189 Malignant neoplasm of colon, unspecified: Secondary | ICD-10-CM

## 2014-02-10 LAB — BASIC METABOLIC PANEL
BUN: 12 mg/dL (ref 6–23)
CALCIUM: 8.8 mg/dL (ref 8.4–10.5)
CHLORIDE: 103 meq/L (ref 96–112)
CO2: 24 mEq/L (ref 19–32)
CREATININE: 0.76 mg/dL (ref 0.50–1.35)
GFR calc non Af Amer: >90 mL/min (ref >90)
Glucose, Bld: 87 mg/dL (ref 70–99)
Potassium: 3.4 mEq/L — ABNORMAL LOW (ref 3.7–5.3)
Sodium: 140 mEq/L (ref 137–147)

## 2014-02-10 MED ORDER — TAMSULOSIN HCL 0.4 MG PO CAPS: 0.4000 mg | ORAL_CAPSULE | Freq: Every day | ORAL | Status: DC

## 2014-02-10 MED ORDER — FENTANYL 75 MCG/HR TD PT72: 75.0000 ug | MEDICATED_PATCH | TRANSDERMAL | Status: DC

## 2014-02-10 MED ORDER — AMOXICILLIN-POT CLAVULANATE 875-125 MG PO TABS: 1.0000 | ORAL_TABLET | Freq: Two times a day (BID) | ORAL | Status: DC

## 2014-02-10 MED ORDER — AMOXICILLIN-POT CLAVULANATE 875-125 MG PO TABS
1.0000 | ORAL_TABLET | Freq: Two times a day (BID) | ORAL | Status: DC
  Administered 2014-02-10: 1 via ORAL
  Filled 2014-02-10: qty 1

## 2014-02-10 MED ORDER — OXYCODONE HCL 10 MG PO TABS: 10.0000 mg | ORAL_TABLET | ORAL | Status: DC | PRN

## 2014-02-10 NOTE — Discharge Summary (Signed)
Physician Discharge Summary  Patient ID: HADES MATHEW MRN: [id number] DOB/AGE: [birth year] 66 y.o.  Admit date: 02/02/2014 Discharge date: 02/10/2014  Admission Diagnoses: ENTEROCUTANEOUS FISTULA  Discharge Diagnoses: SAME  Active Problems:   Enterocutaneous fistula   Urinary retention   Discharged Condition: good  Hospital Course: did well except for urinary retention and slow recovery of bowel function.  Hypokalemia an issue and this was corrected.  Bowels moving and pt discharged with foley and urology follow up.  On flomax for this.    Consults: urology Dr Jeffie Pollock   Significant Diagnostic Studies: labs:  BMET    Component Value Date/Time   NA 140 02/10/2014 0530   K 3.4* 02/10/2014 0530   CL 103 02/10/2014 0530   CO2 24 02/10/2014 0530   GLUCOSE 87 02/10/2014 0530   BUN 12 02/10/2014 0530   CREATININE 0.76 02/10/2014 0530   CALCIUM 8.8 02/10/2014 0530   GFRNONAA >90 02/10/2014 0530   GFRAA >90 02/10/2014 0530     Treatments: surgery: takedown enterocutaneous fistula  Discharge Exam: Blood pressure 116/68, pulse 69, temperature 98.3 F (36.8 C), temperature source Oral, resp. rate 16, height 6\' 2"  (1.88 m), weight 220 lb (99.791 kg), SpO2 95.00%. General appearance: alert and cooperative Incision mild serosanguinous drainage not tender or hot. Min erythema  Soft not distended.   Disposition: 06-Home-Health Care Svc  Discharge Orders   Future Appointments Provider Department Dept Phone   02/18/2014 9:00 AM Marcello Moores A. Faiz Weber, [facility], [place] [phone]   Future Orders Complete By Expires   Discharge instructions  As directed    Increase activity slowly  As directed        Medication List    STOP taking these medications       oxyCODONE-acetaminophen 5-325 MG per tablet  Commonly known as:  PERCOCET/ROXICET      TAKE these medications       amoxicillin-clavulanate 875-125 MG per tablet  Commonly known as:  AUGMENTIN  Take 1 tablet by mouth every  12 (twelve) hours.     fentaNYL 75 MCG/HR  Commonly known as:  DURAGESIC - dosed mcg/hr  Place 1 patch (75 mcg total) onto the skin every 3 (three) days.     fluticasone 50 MCG/ACT nasal spray  Commonly known as:  FLONASE  Place 2 sprays into both nostrils daily.     omeprazole 20 MG capsule  Commonly known as:  PRILOSEC  Take 20 mg by mouth every morning. PRN     Oxycodone HCl 10 MG Tabs  Take 1 tablet (10 mg total) by mouth every 4 (four) hours as needed for severe pain.     pregabalin 75 MG capsule  Commonly known as:  LYRICA  Take 75 mg by mouth every evening.     ranitidine 300 MG tablet  Commonly known as:  ZANTAC  Take 300 mg by mouth at bedtime. PRN     rizatriptan 10 MG tablet  Commonly known as:  MAXALT  Take 10 mg by mouth as needed for migraine. May repeat in 2 hours if needed     tamsulosin 0.4 MG Caps capsule  Commonly known as:  FLOMAX  Take 1 capsule (0.4 mg total) by mouth daily.     VENTOLIN HFA 108 (90 BASE) MCG/ACT inhaler  Generic drug:  albuterol  Inhale 1 puff into the lungs every 6 (six) hours as needed for wheezing or shortness of breath.           Follow-up  Information   Call Malka So, MD. (1-2 weeks in either our [place] or [place] offices.  Please call for time. )    Specialty:  Urology   Contact information:   [facility]  [place] [address] [phone]       Follow up with Latresa Gasser A., MD. Call in 1 week.   Specialty:  General Surgery   Contact information:   [address] [place] [address] [phone]       Signed: Joyice Faster. Draden Cottingham 02/10/2014, 7:13 AM

## 2014-02-10 NOTE — Telephone Encounter (Signed)
Message copied by Carlene Coria on Thu Feb 10, 2014  3:38 PM ------      Message from: Erroll Luna A      Created: Thu Feb 10, 2014  7:03 AM       Needs f/u next week for staple removal. If I 'm not there you can do and he can see me next week.  Any day next week is fine to take them out. Needs referral to Dr Everardo All in [facility] oncologist for colon cancer.       thx       TC ------

## 2014-02-10 NOTE — Discharge Instructions (Signed)

## 2014-02-10 NOTE — Telephone Encounter (Signed)
appt made, referral done

## 2014-02-10 NOTE — Progress Notes (Signed)
Per MD order, PICC line removed. Cath intact at 37.5cm. Vaseline pressure gauze to site, pressure held x 53min. No bleeding to site. Pt instructed to keep dressing CDI x 24 hours. Avoid heavy lifting, pushing or pulling x 24 hours,  If bleeding occurs hold pressure, if bleeding does not stop contact MD or go to the ED. Pt does not have any questions. Catalina Pizza

## 2014-02-10 NOTE — Progress Notes (Signed)
8 Days Post-Op  Subjective: Bowels moving tolerating diet.  Good pain control  Objective: Vital signs in last 24 hours: Temp:  [98.3 F (36.8 C)-98.6 F (37 C)] 98.3 F (36.8 C) (04/23 0617) Pulse Rate:  [69-76] 69 (04/23 0617) Resp:  [16-17] 16 (04/23 0617) BP: (116-132)/(68-81) 116/68 mmHg (04/23 0617) SpO2:  [95 %-97 %] 95 % (04/23 0617) Last BM Date: 02/09/14  Intake/Output from previous day: 04/22 0701 - 04/23 0700 In: 1780.5 [P.O.:1200; I.V.:180.5; IV Piggyback:400] Out: 2200 [Urine:2200] Intake/Output this shift:    Incision/Wound:mild serosanguinous drainage noted.  Minimal erythema. Not tender or warm   Lab Results:   Recent Labs  02/08/14 0420  WBC 7.3  HGB 8.1*  HCT 24.1*  PLT 195   BMET  Recent Labs  02/09/14 0556 02/10/14 0530  NA 141 140  K 2.8* 3.4*  CL 103 103  CO2 23 24  GLUCOSE 88 87  BUN 9 12  CREATININE 0.64  0.71 0.76  CALCIUM 8.7 8.8   PT/INR No results found for this basename: LABPROT, INR,  in the last 72 hours ABG No results found for this basename: PHART, PCO2, PO2, HCO3,  in the last 72 hours  Studies/Results: No results found.  Anti-infectives: Anti-infectives   Start     Dose/Rate Route Frequency Ordered Stop   02/10/14 1000  amoxicillin-clavulanate (AUGMENTIN) 875-125 MG per tablet 1 tablet     1 tablet Oral Every 12 hours 02/10/14 0705     02/02/14 2000  cefOXitin (MEFOXIN) 1 g in dextrose 5 % 50 mL IVPB     1 g 100 mL/hr over 30 Minutes Intravenous 4 times per day 02/02/14 1838 02/02/14 2113   02/02/14 0600  cefoTEtan (CEFOTAN) 2 g in dextrose 5 % 50 mL IVPB     2 g 100 mL/hr over 30 Minutes Intravenous On call to O.R. 02/01/14 1442 02/02/14 1338   02/01/14 1442  neomycin (MYCIFRADIN) tablet 1,000 mg  Status:  Discontinued     1,000 mg Oral 3 times per day 02/01/14 1442 02/02/14 1838   02/01/14 1442  erythromycin (E-MYCIN) tablet 1,000 mg  Status:  Discontinued     1,000 mg Oral 3 times per day 02/01/14 1442  02/02/14 1838      Assessment/Plan: s/p Procedure(s): TAKE DOWN OF ENTEROCUTANEOUS  FISTULA (N/A) Discharge Keep foley  Follow up next week for staple removal augmentin 875 mg po bid for 7 days. folow up with urology as outpatient  LOS: 8 days    Hezzie Karim A. Georgios Kina 02/10/2014

## 2014-02-10 NOTE — Progress Notes (Signed)
Discharge instructions reviewed with patient and wife, questions answered, verbalized understanding.  Did switch patient over to leg bag with instructions to wife and patient on how to do this as well as switch to regular bag at night.  Gave instruction sheet on how to care for foley at home.  Written prescriptions given to patient for pain medication.  Dry gauze dressing over incision.  Patient transported to front of hospital via wheelchair to be taken home by wife.  Patient in good condition at time of discharge from [facility].

## 2014-02-13 ENCOUNTER — Emergency Department (HOSPITAL_COMMUNITY)
Admission: EM | Admit: 2014-02-13 | Discharge: 2014-02-14 | Disposition: A | Discharge status: Home / Self Care | Payer: Managed Care, Other (non HMO) | Attending: Emergency Medicine | Admitting: Emergency Medicine

## 2014-02-13 ENCOUNTER — Emergency Department (HOSPITAL_COMMUNITY): Payer: Managed Care, Other (non HMO)

## 2014-02-13 ENCOUNTER — Telehealth (INDEPENDENT_AMBULATORY_CARE_PROVIDER_SITE_OTHER): Payer: Self-pay | Admitting: Surgery

## 2014-02-13 ENCOUNTER — Encounter (HOSPITAL_COMMUNITY): Payer: Self-pay | Admitting: Emergency Medicine

## 2014-02-13 DIAGNOSIS — E860 Dehydration: Secondary | ICD-10-CM

## 2014-02-13 DIAGNOSIS — K219 Gastro-esophageal reflux disease without esophagitis: Secondary | ICD-10-CM | POA: Insufficient documentation

## 2014-02-13 DIAGNOSIS — IMO0002 Reserved for concepts with insufficient information to code with codable children: Secondary | ICD-10-CM | POA: Insufficient documentation

## 2014-02-13 DIAGNOSIS — Z789 Other specified health status: Secondary | ICD-10-CM | POA: Insufficient documentation

## 2014-02-13 DIAGNOSIS — R112 Nausea with vomiting, unspecified: Secondary | ICD-10-CM | POA: Insufficient documentation

## 2014-02-13 DIAGNOSIS — Z79899 Other long term (current) drug therapy: Secondary | ICD-10-CM | POA: Insufficient documentation

## 2014-02-13 DIAGNOSIS — Z9889 Other specified postprocedural states: Secondary | ICD-10-CM | POA: Insufficient documentation

## 2014-02-13 DIAGNOSIS — Z792 Long term (current) use of antibiotics: Secondary | ICD-10-CM | POA: Insufficient documentation

## 2014-02-13 DIAGNOSIS — M129 Arthropathy, unspecified: Secondary | ICD-10-CM | POA: Insufficient documentation

## 2014-02-13 DIAGNOSIS — G589 Mononeuropathy, unspecified: Secondary | ICD-10-CM | POA: Insufficient documentation

## 2014-02-13 DIAGNOSIS — Z85038 Personal history of other malignant neoplasm of large intestine: Secondary | ICD-10-CM | POA: Insufficient documentation

## 2014-02-13 DIAGNOSIS — Z9089 Acquired absence of other organs: Secondary | ICD-10-CM | POA: Insufficient documentation

## 2014-02-13 DIAGNOSIS — M5030 Other cervical disc degeneration, unspecified cervical region: Secondary | ICD-10-CM | POA: Insufficient documentation

## 2014-02-13 LAB — CBC WITH DIFFERENTIAL/PLATELET
BASOS PCT: 0 % (ref 0–1)
Basophils Absolute: 0 10*3/uL (ref 0.0–0.1)
EOS PCT: 0 % (ref 0–5)
Eosinophils Absolute: 0 10*3/uL (ref 0.0–0.7)
HEMATOCRIT: 30.7 % — AB (ref 39.0–52.0)
HEMOGLOBIN: 10.2 g/dL — AB (ref 13.0–17.0)
Lymphocytes Relative: 6 % — ABNORMAL LOW (ref 12–46)
Lymphs Abs: 0.7 10*3/uL (ref 0.7–4.0)
MCH: 28.1 pg (ref 26.0–34.0)
MCHC: 33.2 g/dL (ref 30.0–36.0)
MCV: 84.6 fL (ref 78.0–100.0)
MONO ABS: 0.8 10*3/uL (ref 0.1–1.0)
MONOS PCT: 6 % (ref 3–12)
NEUTROS ABS: 11.3 10*3/uL — AB (ref 1.7–7.7)
Neutrophils Relative %: 88 % — ABNORMAL HIGH (ref 43–77)
Platelets: 540 10*3/uL — ABNORMAL HIGH (ref 150–400)
RBC: 3.63 MIL/uL — ABNORMAL LOW (ref 4.22–5.81)
RDW: 14.3 % (ref 11.5–15.5)
WBC: 12.8 10*3/uL — ABNORMAL HIGH (ref 4.0–10.5)

## 2014-02-13 LAB — COMPREHENSIVE METABOLIC PANEL
ALT: 102 U/L — ABNORMAL HIGH (ref 0–53)
AST: 108 U/L — ABNORMAL HIGH (ref 0–37)
Albumin: 3.1 g/dL — ABNORMAL LOW (ref 3.5–5.2)
Alkaline Phosphatase: 411 U/L — ABNORMAL HIGH (ref 39–117)
BILIRUBIN TOTAL: 0.7 mg/dL (ref 0.3–1.2)
BUN: 9 mg/dL (ref 6–23)
CALCIUM: 9.2 mg/dL (ref 8.4–10.5)
CHLORIDE: 98 meq/L (ref 96–112)
CO2: 27 mEq/L (ref 19–32)
Creatinine, Ser: 0.74 mg/dL (ref 0.50–1.35)
GFR calc Af Amer: >90 mL/min (ref >90)
Glucose, Bld: 120 mg/dL — ABNORMAL HIGH (ref 70–99)
Potassium: 3.3 mEq/L — ABNORMAL LOW (ref 3.7–5.3)
Sodium: 135 mEq/L — ABNORMAL LOW (ref 137–147)
Total Protein: 8 g/dL (ref 6.0–8.3)

## 2014-02-13 MED ORDER — SODIUM CHLORIDE 0.9 % IV SOLN
1000.0000 mL | Freq: Once | INTRAVENOUS | Status: AC
Start: 2014-02-13 — End: 2014-02-13
  Administered 2014-02-13: 1000 mL via INTRAVENOUS

## 2014-02-13 MED ORDER — ONDANSETRON HCL 4 MG/2ML IJ SOLN
4.0000 mg | Freq: Once | INTRAMUSCULAR | Status: AC
  Administered 2014-02-13: 4 mg via INTRAVENOUS
  Filled 2014-02-13: qty 2

## 2014-02-13 MED ORDER — SODIUM CHLORIDE 0.9 % IV SOLN
1000.0000 mL | Freq: Once | INTRAVENOUS | Status: AC
  Administered 2014-02-13: 1000 mL via INTRAVENOUS

## 2014-02-13 MED ORDER — ONDANSETRON 4 MG PO TBDP
8.0000 mg | ORAL_TABLET | Freq: Once | ORAL | Status: AC
  Administered 2014-02-13: 8 mg via ORAL
  Filled 2014-02-13: qty 2

## 2014-02-13 MED ORDER — SODIUM CHLORIDE 0.9 % IV SOLN
1000.0000 mL | INTRAVENOUS | Status: DC
  Administered 2014-02-13: 1000 mL via INTRAVENOUS

## 2014-02-13 NOTE — ED Notes (Signed)
MD at bedside. 

## 2014-02-13 NOTE — ED Notes (Signed)
Patient transported to X-ray 

## 2014-02-13 NOTE — Telephone Encounter (Signed)
Mr. Hurrell had a takedown of a ileocolonic enterocutaneous fistula with partial colectomy on 02/02/2014 by Dr. Brantley Stage.  Was doing well until 5 AM today, when he developed worsening abdominal pain.  The about 1 and 1/2 hours ago, he started vomiting.  He now has dry heaves.    They live in [place].  I have advised that he come to [facility] ER.  Dr. Grandville Silos is on call there and I will speak to him.  Alphonsa Overall, MD, [facility] Pager: [phone] Office phone:  [phone]

## 2014-02-13 NOTE — ED Notes (Signed)
Pt was given orange juice, and pt was able to keep juice down.

## 2014-02-13 NOTE — ED Notes (Signed)
Pt. reports nausea and vomitting onset last night , s/p colon (cancer) surgery by Dr. Donella Stade last Thursday.

## 2014-02-13 NOTE — ED Provider Notes (Signed)
CSN: [id number]     Arrival date & time 02/13/14  1901 History   First MD Initiated Contact with Patient 02/13/14 2143     Chief Complaint  Patient presents with  . Emesis     (Consider location/radiation/quality/duration/timing/severity/associated sxs/prior Treatment) HPI Patient reports he had surgery done on April 15 by Dr. 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 He reports he has been doing well. He states last night the family shared a sub-sandwich and salad. About 4 hours later he started having diffuse abdominal pains all night long. He states it started about 3 AM. He reports at 5 AM his fentanyl patch came off however was time for a new one. He states his new fentanyl patch would not stay on so he taped it in place. About 7 AM he took Gas-X and 2 pain pills. He reports nausea all day long. He started having vomiting about 4:30 this afternoon and he states he's vomited at least 15 times. He states he had a normal bowel movement earlier this morning and none since. He denies any swelling of his abdomen or urge to have a bowel movement. He denies any fever. He denies any abdominal pain now. He removed his fentanyl patch about 5 PM thinking that was making him ill.  PCP Dr Gerarda Fraction Surgeon  Dr Donella Stade  Past Medical History  Diagnosis Date  . Arthritis   . DDD (degenerative disc disease), cervical   . Wears glasses   . Neuropathy 2010  . History of seasonal allergies   . Headache(784.0)   . GERD (gastroesophageal reflux disease)   . Constipation   . Colon cancer 10/20/13    colon resection. Skin cancer removed from face   Past Surgical History  Procedure Laterality Date  . Hemorrhoid surgery    . Carpal tunnel release  2011    left  . Knee arthroscopy  2001    rt and lt  . Upper gi endoscopy    . Colonoscopy    . Inguinal hernia repair      right ([place])  . Tonsillectomy    . Carpal tunnel  release Right 01/07/2013    Procedure: CARPAL TUNNEL RELEASE;  Surgeon: Tennis Must, MD;  Location: [facility];  Service: Orthopedics;  Laterality: Right;  . Colonoscopy N/A 10/18/2013    Procedure: COLONOSCOPY;  Surgeon: Danie Binder, MD;  Location: [facility];  Service: Endoscopy;  Laterality: N/A;  2:00-moved to 1030 Pam to notify pt  . Colon resection N/A 10/20/2013    Procedure:  LAPAROSCOPIC hand assisted partial colectomy;  Surgeon: Jamesetta So, MD;  Location: [facility];  Service: General;  Laterality: N/A;  . Appendectomy  10/20/13    (with right hemicolectomy)  . Inguinal hernia repair Bilateral     left x2 ([place], and one at [facility])  . Take down of intestinal fistula N/A 02/02/2014    Procedure: TAKE DOWN OF ENTEROCUTANEOUS  FISTULA;  Surgeon: Marcello Moores A. Cornett, MD;  Location: [facility];  Service: General;  Laterality: N/A;   Family History  Problem Relation Age of Onset  . Colon polyps Father   . Colon polyps Paternal Uncle   . Colon polyps Maternal Grandfather    History  Substance Use Topics  . Smoking status: Never Smoker   . Smokeless tobacco: Not on file  . Alcohol Use: No  Lives at home Lives with spouse  Review of Systems  All other systems reviewed and are negative.     Allergies  Niaspan  Home Medications   Prior to Admission medications   Medication Sig Start Date End Date Taking? Authorizing Provider  amoxicillin-clavulanate (AUGMENTIN) 875-125 MG per tablet Take 1 tablet by mouth every 12 (twelve) hours. 02/10/14   Emina Riebock, NP  fentaNYL (DURAGESIC - DOSED MCG/HR) 75 MCG/HR Place 1 patch (75 mcg total) onto the skin every 3 (three) days. 02/10/14   Emina Riebock, NP  fluticasone (FLONASE) 50 MCG/ACT nasal spray Place 2 sprays into both nostrils daily. 09/28/13   Historical Provider, MD  omeprazole (PRILOSEC) 20 MG capsule Take 20 mg by mouth every morning. PRN    Historical Provider, MD  Oxycodone HCl 10 MG TABS Take 1 tablet (10 mg  total) by mouth every 4 (four) hours as needed. 02/10/14   Emina Riebock, NP  pregabalin (LYRICA) 75 MG capsule Take 75 mg by mouth every evening.     Historical Provider, MD  ranitidine (ZANTAC) 300 MG tablet Take 300 mg by mouth at bedtime. PRN 07/15/13   Historical Provider, MD  rizatriptan (MAXALT) 10 MG tablet Take 10 mg by mouth as needed for migraine. May repeat in 2 hours if needed    Historical Provider, MD  tamsulosin (FLOMAX) 0.4 MG CAPS capsule Take 1 capsule (0.4 mg total) by mouth daily. 02/10/14   Emina Riebock, NP  VENTOLIN HFA 108 (90 BASE) MCG/ACT inhaler Inhale 1 puff into the lungs every 6 (six) hours as needed for wheezing or shortness of breath.  11/26/13   Historical Provider, MD   BP 125/71  Pulse 64  Temp(Src) 97.8 F (36.6 C) (Oral)  Resp 16  Ht 6\' 2"  (1.88 m)  Wt 200 lb (90.719 kg)  BMI 25.67 kg/m2  SpO2 99%  Vital signs normal   Physical Exam  Nursing note and vitals reviewed. Constitutional: He is oriented to person, place, and time. He appears well-developed and well-nourished.  Non-toxic appearance. He does not appear ill. No distress.  HENT:  Head: Normocephalic and atraumatic.  Right Ear: External ear normal.  Left Ear: External ear normal.  Nose: Nose normal. No mucosal edema or rhinorrhea.  Mouth/Throat: Mucous membranes are normal. No dental abscesses or uvula swelling.  Tongue is dry  Eyes: Conjunctivae and EOM are normal. Pupils are equal, round, and reactive to light.  Neck: Normal range of motion and full passive range of motion without pain. Neck supple.  Cardiovascular: Normal rate, regular rhythm and normal heart sounds.  Exam reveals no gallop and no friction rub.   No murmur heard. Pulmonary/Chest: Effort normal and breath sounds normal. No respiratory distress. He has no wheezes. He has no rhonchi. He has no rales. He exhibits no tenderness and no crepitus.  Abdominal: Soft. Normal appearance. He exhibits no distension. There is no  tenderness. There is no rebound and no guarding.  Bowel sounds are diminished. Patient has intact staples in his midline wound. There is minimal redness at the incision. There is no purulent drainage, there is evidence of some mild blood-tinged drainage in his navel  Musculoskeletal: Normal range of motion. He exhibits no edema and no tenderness.  Moves all extremities well.   Neurological: He is alert and oriented to person, place, and time. He has normal strength. No cranial nerve deficit.  Skin: Skin is warm, dry and intact. No rash noted. No erythema. No pallor.  Psychiatric: He has a normal mood and affect. His  speech is normal and behavior is normal. His mood appears not anxious.    ED Course  Procedures (including critical care time)  Medications  0.9 %  sodium chloride infusion (0 mLs Intravenous Stopped 02/13/14 2350)    Followed by  0.9 %  sodium chloride infusion (0 mLs Intravenous Stopped 02/13/14 2350)    Followed by  0.9 %  sodium chloride infusion (1,000 mLs Intravenous New Bag/Given 02/13/14 2350)  ondansetron (ZOFRAN-ODT) disintegrating tablet 8 mg (8 mg Oral Given 02/13/14 1914)  ondansetron (ZOFRAN) injection 4 mg (4 mg Intravenous Given 02/13/14 2251)   23:15 Feeling better, wanting to try to drink fluids.   23:50 drinking orange juice and feels fine, getting his 3rd liter of NS, feels ready to be discharged.   Labs Review Results for orders placed during the hospital encounter of 02/13/14  CBC WITH DIFFERENTIAL      Result Value Ref Range   WBC 12.8 (*) 4.0 - 10.5 K/uL   RBC 3.63 (*) 4.22 - 5.81 MIL/uL   Hemoglobin 10.2 (*) 13.0 - 17.0 g/dL   HCT 30.7 (*) 39.0 - 52.0 %   MCV 84.6  78.0 - 100.0 fL   MCH 28.1  26.0 - 34.0 pg   MCHC 33.2  30.0 - 36.0 g/dL   RDW 14.3  11.5 - 15.5 %   Platelets 540 (*) 150 - 400 K/uL   Neutrophils Relative % 88 (*) 43 - 77 %   Neutro Abs 11.3 (*) 1.7 - 7.7 K/uL   Lymphocytes Relative 6 (*) 12 - 46 %   Lymphs Abs 0.7  0.7 - 4.0 K/uL    Monocytes Relative 6  3 - 12 %   Monocytes Absolute 0.8  0.1 - 1.0 K/uL   Eosinophils Relative 0  0 - 5 %   Eosinophils Absolute 0.0  0.0 - 0.7 K/uL   Basophils Relative 0  0 - 1 %   Basophils Absolute 0.0  0.0 - 0.1 K/uL  COMPREHENSIVE METABOLIC PANEL      Result Value Ref Range   Sodium 135 (*) 137 - 147 mEq/L   Potassium 3.3 (*) 3.7 - 5.3 mEq/L   Chloride 98  96 - 112 mEq/L   CO2 27  19 - 32 mEq/L   Glucose, Bld 120 (*) 70 - 99 mg/dL   BUN 9  6 - 23 mg/dL   Creatinine, Ser 0.74  0.50 - 1.35 mg/dL   Calcium 9.2  8.4 - 10.5 mg/dL   Total Protein 8.0  6.0 - 8.3 g/dL   Albumin 3.1 (*) 3.5 - 5.2 g/dL   AST 108 (*) 0 - 37 U/L   ALT 102 (*) 0 - 53 U/L   Alkaline Phosphatase 411 (*) 39 - 117 U/L   Total Bilirubin 0.7  0.3 - 1.2 mg/dL   GFR calc non Af Amer >90  >90 mL/min   GFR calc Af Amer >90  >90 mL/min    Laboratory interpretation all normal except mild hypokalemia, leukocytosis, stable anemia     Imaging Review Dg Abd Acute W/chest  02/13/2014   CLINICAL DATA:  Nausea and vomiting.  Recent colon surgery.  EXAM: ACUTE ABDOMEN SERIES (ABDOMEN 2 VIEW & CHEST 1 VIEW)  COMPARISON:  Chest radiograph performed 02/04/2014, and CT of the abdomen and pelvis performed 12/14/2013  FINDINGS: The lungs are well-aerated. Mild left basilar atelectasis is noted. There is no evidence of focal opacification, pleural effusion or pneumothorax. The cardiomediastinal silhouette is within normal  limits. There is elevation of the right hemidiaphragm.  The visualized bowel gas pattern is unremarkable. Scattered fluid and air are seen within the colon; there is no evidence of small bowel dilatation to suggest obstruction. A bowel suture line is noted at the right mid abdomen. No free intra-abdominal air is identified on the provided upright view. Midline skin staples are seen at the lower abdomen and pelvis. A mesh is noted at the lower pelvis.  No acute osseous abnormalities are seen; the sacroiliac joints  are unremarkable in appearance.  IMPRESSION: 1. Unremarkable bowel gas pattern; no free intra-abdominal air seen. 2. Mild elevation of the right hemidiaphragm; mild left basilar atelectasis noted. Lungs otherwise grossly clear.   Electronically Signed   By: Garald Balding M.D.   On: 02/13/2014 22:51     EKG Interpretation None      MDM   Final diagnoses:  Dehydration  Nausea and vomiting   New Prescriptions   ONDANSETRON (ZOFRAN ODT) 4 MG DISINTEGRATING TABLET    Take 1 tablet (4 mg total) by mouth every 8 (eight) hours as needed for nausea or vomiting.     Plan discharge  Rolland Porter, MD, Alanson Aly, MD 02/14/14 [phone]

## 2014-02-14 ENCOUNTER — Encounter (INDEPENDENT_AMBULATORY_CARE_PROVIDER_SITE_OTHER): Payer: Self-pay

## 2014-02-14 ENCOUNTER — Telehealth (INDEPENDENT_AMBULATORY_CARE_PROVIDER_SITE_OTHER): Payer: Self-pay | Admitting: Surgery

## 2014-02-14 ENCOUNTER — Telehealth (INDEPENDENT_AMBULATORY_CARE_PROVIDER_SITE_OTHER): Payer: Self-pay

## 2014-02-14 MED ORDER — ONDANSETRON 4 MG PO TBDP: 4.0000 mg | ORAL_TABLET | Freq: Three times a day (TID) | ORAL | Status: DC | PRN

## 2014-02-14 NOTE — Discharge Instructions (Signed)
Drink plenty of fluids, slowly advance your diet as you can tolerate it.  Use the zofran for nausea or vomiting. Keep your appointment with Dr Donella Stade later this week. Return to the ED if you feel worse again.

## 2014-02-14 NOTE — Telephone Encounter (Signed)
DR. Nigel Berthold called to inform Dr. Brantley Stage patient has appt 12-18-13 @ 11am patient is unreachable ,please make sure he is aware .

## 2014-02-14 NOTE — Telephone Encounter (Signed)
Patient is aware of Date/Time of appt

## 2014-02-14 NOTE — ED Notes (Signed)
Discharge instructions reviewed with pt and family member. Pt verbalized understanding.  

## 2014-02-15 ENCOUNTER — Telehealth (INDEPENDENT_AMBULATORY_CARE_PROVIDER_SITE_OTHER): Payer: Self-pay

## 2014-02-15 DIAGNOSIS — R110 Nausea: Secondary | ICD-10-CM

## 2014-02-15 DIAGNOSIS — M62830 Muscle spasm of back: Secondary | ICD-10-CM

## 2014-02-15 MED ORDER — BACLOFEN 10 MG PO TABS: 5.0000 mg | ORAL_TABLET | Freq: Three times a day (TID) | ORAL | Status: DC

## 2014-02-15 MED ORDER — METOCLOPRAMIDE HCL 10 MG PO TABS: 10.0000 mg | ORAL_TABLET | Freq: Three times a day (TID) | ORAL | Status: DC

## 2014-02-15 NOTE — Addendum Note (Signed)
Addended by: Carlene Coria on: 02/15/2014 01:50 PM   Modules accepted: Orders

## 2014-02-15 NOTE — Telephone Encounter (Signed)
Prescriptions called into pharmacy. Pt aware and given all below instructions.

## 2014-02-15 NOTE — Telephone Encounter (Signed)
Pt s/p colon surgery on 02/02/2014 by Dr. Brantley Stage.  His wife is calling this morning to report that he was seen in ED at [facility] this past Sunday for N/V.  No fever or chills.  He was given nausea medication and sent home.  Pt still has urinary catheter and is urinating fine.  Yesterday he began having muscle spasms in his back.  Advised her that these spasms may be due to the vomiting spells and to apply a heating pad and rest.  Nausea only occurs after he eats.  Advised to only eat small meals and liquids as tolerated.  Will make Dr. Brantley Stage aware that the patient would like some medication to help with the spasms.

## 2014-02-15 NOTE — Telephone Encounter (Signed)
Call in baclofen 5 mg po tid  # 30  For spasms  reglan 10 mg tabs # 20  1 po tid prn nausea.  May want to take before meals.   gatorade 1 bottle po tid  Boost 1 con po tid

## 2014-02-17 ENCOUNTER — Ambulatory Visit (INDEPENDENT_AMBULATORY_CARE_PROVIDER_SITE_OTHER): Payer: Managed Care, Other (non HMO) | Admitting: Surgery

## 2014-02-17 ENCOUNTER — Encounter (INDEPENDENT_AMBULATORY_CARE_PROVIDER_SITE_OTHER): Payer: Self-pay | Admitting: Surgery

## 2014-02-17 VITALS — BP 122/70 | HR 68 | Temp 97.8°F | Resp 16 | Ht 74.0 in | Wt 199.6 lb

## 2014-02-17 DIAGNOSIS — Z9889 Other specified postprocedural states: Secondary | ICD-10-CM

## 2014-02-17 NOTE — Patient Instructions (Signed)
KEEP GAUZE OVER CENTRAL AREA AND CHANGE DAILY. DRINK PLENTY FLUIDS.

## 2014-02-17 NOTE — Progress Notes (Signed)
Patient returns 15 days post op and after takedown of enterocutaneous fistula. He was seen Monday dehydration in the emergency room he received IV fluids. He is weak but moving his bowels. He has an indwelling Foley catheter into urinary retention and he is to see the urologist next week. He is able to eat and drink. He does have some episodes of nausea controlled with Reglan. Overall, he is very tired.  Exam: He is afebrile with stable vital signs.  Abdomen: Staples removed from incision. Small opening in the middle of the incision with serous-like drainage. No pus, erythema or fluctuance.  Impression: Takedown of enterocutaneous fistula now 15 days out  Plan: Continue to push fluids. Increase diet as he can tolerate. Keep dry dressing over incision. Steri-Strips now in place. He may shower. Return in 2 weeks. Add boost one can mouth 3 times a day. Patient to see urology next week

## 2014-02-18 ENCOUNTER — Encounter (INDEPENDENT_AMBULATORY_CARE_PROVIDER_SITE_OTHER): Payer: Managed Care, Other (non HMO) | Admitting: Surgery

## 2014-02-24 ENCOUNTER — Telehealth (INDEPENDENT_AMBULATORY_CARE_PROVIDER_SITE_OTHER): Payer: Self-pay

## 2014-02-24 ENCOUNTER — Encounter (INDEPENDENT_AMBULATORY_CARE_PROVIDER_SITE_OTHER): Payer: Self-pay

## 2014-02-24 NOTE — Telephone Encounter (Signed)
Pt s/p takedown of enterocutaneous fistula on 4/15. Pt staples removed on 02/17/14 with a small opening in the middle of the incision. Mandy from [facility] noticed when she went to see him today that the opening has gotten deeper. The opening last week was 0.8cms and this week it is 1.5cm deep. Nurse asked if packing should be initiated or what would Dr Brantley Stage advise to do. Dr Brantley Stage not available to page will ask urgent officeDr for instructions. Leafy Ro states that incision  otherwise looks good.

## 2014-02-24 NOTE — Telephone Encounter (Signed)
Informed Mandy that Dr Cristal Generous recommendations were to pack with W-D dressing BID.  She verbalized understanding and agreed with POC.

## 2014-03-03 ENCOUNTER — Encounter (INDEPENDENT_AMBULATORY_CARE_PROVIDER_SITE_OTHER): Payer: Self-pay

## 2014-03-04 ENCOUNTER — Ambulatory Visit (INDEPENDENT_AMBULATORY_CARE_PROVIDER_SITE_OTHER): Payer: Managed Care, Other (non HMO) | Admitting: Surgery

## 2014-03-04 ENCOUNTER — Encounter (INDEPENDENT_AMBULATORY_CARE_PROVIDER_SITE_OTHER): Payer: Self-pay | Admitting: Surgery

## 2014-03-04 VITALS — BP 110/70 | HR 84 | Temp 97.8°F | Resp 16 | Ht 74.0 in | Wt 201.0 lb

## 2014-03-04 DIAGNOSIS — Z9889 Other specified postprocedural states: Secondary | ICD-10-CM

## 2014-03-04 NOTE — Patient Instructions (Signed)
Pack for 2 more weeks then stop and cover with clean dry dressing.

## 2014-03-04 NOTE — Progress Notes (Signed)
Patient returns 30  days post op and after takedown of enterocutaneous fistula.He is doing well. Voiding and eating better.  Bowels moving.  Exam: He is afebrile with stable vital signs.  Abdomen: . Small opening in the middle of the incision with serous-like drainage.  Repacked 2 cm x 1 cm x 1 cm clean drain site ok    Impression: Takedown of enterocutaneous fistula now 30 days out  Plan: Continue to push fluids. Increase diet as he can tolerate. Keep packing  incision.  Looks much better.

## 2014-04-01 ENCOUNTER — Encounter (HOSPITAL_COMMUNITY): Payer: Self-pay

## 2014-04-04 ENCOUNTER — Ambulatory Visit (INDEPENDENT_AMBULATORY_CARE_PROVIDER_SITE_OTHER): Payer: Managed Care, Other (non HMO) | Admitting: Surgery

## 2014-04-04 ENCOUNTER — Encounter (INDEPENDENT_AMBULATORY_CARE_PROVIDER_SITE_OTHER): Payer: Self-pay | Admitting: Surgery

## 2014-04-04 VITALS — BP 116/80 | HR 76 | Resp 16 | Ht 74.0 in | Wt 203.0 lb

## 2014-04-04 DIAGNOSIS — Z9889 Other specified postprocedural states: Secondary | ICD-10-CM

## 2014-04-04 NOTE — Progress Notes (Signed)
Patient returns 40   days post op and after takedown of enterocutaneous fistula.He is doing well. Voiding and eating better.  Bowels moving but having some hemorrhoid flare up.  Better now.  Feeling certain foods not digesting well.   Exam: He is afebrile with stable vital signs.  Abdomen: . Small opening in the middle of the incision with serous-like drainage.  5 mm wound midline.  clean    Impression: Takedown of enterocutaneous fistula now 40 days out   Plan: high fiber diet Stop packing wound RTC 3 months

## 2014-04-04 NOTE — Patient Instructions (Signed)
High-Fiber Diet Fiber is found in fruits, vegetables, and grains. A high-fiber diet encourages the addition of more whole grains, legumes, fruits, and vegetables in your diet. The recommended amount of fiber for adult males is 38 g per day. For adult females, it is 25 g per day. Pregnant and lactating women should get 28 g of fiber per day. If you have a digestive or bowel problem, ask your caregiver for advice before adding high-fiber foods to your diet. Eat a variety of high-fiber foods instead of only a select few type of foods.  PURPOSE  To increase stool bulk.  To make bowel movements more regular to prevent constipation.  To lower cholesterol.  To prevent overeating. WHEN IS THIS DIET USED?  It may be used if you have constipation and hemorrhoids.  It may be used if you have uncomplicated diverticulosis (intestine condition) and irritable bowel syndrome.  It may be used if you need help with weight management.  It may be used if you want to add it to your diet as a protective measure against atherosclerosis, diabetes, and cancer. SOURCES OF FIBER  Whole-grain breads and cereals.  Fruits, such as apples, oranges, bananas, berries, prunes, and pears.  Vegetables, such as green peas, carrots, sweet potatoes, beets, broccoli, cabbage, spinach, and artichokes.  Legumes, such split peas, soy, lentils.  Almonds. FIBER CONTENT IN FOODS Starches and Grains / Dietary Fiber (g)  Cheerios, 1 cup / 3 g  Corn Flakes cereal, 1 cup / 0.7 g  Rice crispy treat cereal, 1 cup / 0.3 g  Instant oatmeal (cooked),  cup / 2 g  Frosted wheat cereal, 1 cup / 5.1 g  Brown, long-grain rice (cooked), 1 cup / 3.5 g  White, long-grain rice (cooked), 1 cup / 0.6 g  Enriched macaroni (cooked), 1 cup / 2.5 g Legumes / Dietary Fiber (g)  Baked beans (canned, plain, or vegetarian),  cup / 5.2 g  Kidney beans (canned),  cup / 6.8 g  Pinto beans (cooked),  cup / 5.5 g Breads and Crackers  / Dietary Fiber (g)  Plain or honey graham crackers, 2 squares / 0.7 g  Saltine crackers, 3 squares / 0.3 g  Plain, salted pretzels, 10 pieces / 1.8 g  Whole-wheat bread, 1 slice / 1.9 g  White bread, 1 slice / 0.7 g  Raisin bread, 1 slice / 1.2 g  Plain bagel, 3 oz / 2 g  Flour tortilla, 1 oz / 0.9 g  Corn tortilla, 1 small / 1.5 g  Hamburger or hotdog bun, 1 small / 0.9 g Fruits / Dietary Fiber (g)  Apple with skin, 1 medium / 4.4 g  Sweetened applesauce,  cup / 1.5 g  Banana,  medium / 1.5 g  Grapes, 10 grapes / 0.4 g  Orange, 1 small / 2.3 g  Raisin, 1.5 oz / 1.6 g  Melon, 1 cup / 1.4 g Vegetables / Dietary Fiber (g)  Green beans (canned),  cup / 1.3 g  Carrots (cooked),  cup / 2.3 g  Broccoli (cooked),  cup / 2.8 g  Peas (cooked),  cup / 4.4 g  Mashed potatoes,  cup / 1.6 g  Lettuce, 1 cup / 0.5 g  Corn (canned),  cup / 1.6 g  Tomato,  cup / 1.1 g Document Released: 10/07/2005 Document Revised: 04/07/2012 Document Reviewed: 01/09/2012 [facility] Patient Information 2014 [place], [place]. Hemorrhoids Hemorrhoids are swollen veins around the rectum or anus. There are two types of hemorrhoids:  Internal hemorrhoids. These occur in the veins just inside the rectum. They may poke through to the outside and become irritated and painful.  External hemorrhoids. These occur in the veins outside the anus and can be felt as a painful swelling or hard lump near the anus. CAUSES  Pregnancy.   Obesity.   Constipation or diarrhea.   Straining to have a bowel movement.   Sitting for long periods on the toilet.  Heavy lifting or other activity that caused you to strain.  Anal intercourse. SYMPTOMS   Pain.   Anal itching or irritation.   Rectal bleeding.   Fecal leakage.   Anal swelling.   One or more lumps around the anus.  DIAGNOSIS  Your caregiver may be able to diagnose hemorrhoids by visual examination. Other  examinations or tests that may be performed include:   Examination of the rectal area with a gloved hand (digital rectal exam).   Examination of anal canal using a small tube (scope).   A blood test if you have lost a significant amount of blood.  A test to look inside the colon (sigmoidoscopy or colonoscopy). TREATMENT Most hemorrhoids can be treated at home. However, if symptoms do not seem to be getting better or if you have a lot of rectal bleeding, your caregiver may perform a procedure to help make the hemorrhoids get smaller or remove them completely. Possible treatments include:   Placing a rubber band at the base of the hemorrhoid to cut off the circulation (rubber band ligation).   Injecting a chemical to shrink the hemorrhoid (sclerotherapy).   Using a tool to burn the hemorrhoid (infrared light therapy).   Surgically removing the hemorrhoid (hemorrhoidectomy).   Stapling the hemorrhoid to block blood flow to the tissue (hemorrhoid stapling).  HOME CARE INSTRUCTIONS   Eat foods with fiber, such as whole grains, beans, nuts, fruits, and vegetables. Ask your doctor about taking products with added fiber in them (fibersupplements).  Increase fluid intake. Drink enough water and fluids to keep your urine clear or pale yellow.   Exercise regularly.   Go to the bathroom when you have the urge to have a bowel movement. Do not wait.   Avoid straining to have bowel movements.   Keep the anal area dry and clean. Use wet toilet paper or moist towelettes after a bowel movement.   Medicated creams and suppositories may be used or applied as directed.   Only take over-the-counter or prescription medicines as directed by your caregiver.   Take warm sitz baths for 15 20 minutes, 3 4 times a day to ease pain and discomfort.   Place ice packs on the hemorrhoids if they are tender and swollen. Using ice packs between sitz baths may be helpful.   Put ice in a  plastic bag.   Place a towel between your skin and the bag.   Leave the ice on for 15 20 minutes, 3 4 times a day.   Do not use a donut-shaped pillow or sit on the toilet for long periods. This increases blood pooling and pain.  SEEK MEDICAL CARE IF:  You have increasing pain and swelling that is not controlled by treatment or medicine.  You have uncontrolled bleeding.  You have difficulty or you are unable to have a bowel movement.  You have pain or inflammation outside the area of the hemorrhoids. MAKE SURE YOU:  Understand these instructions.  Will watch your condition.  Will get help right away if you  are not doing well or get worse. Document Released: 10/04/2000 Document Revised: 09/23/2012 Document Reviewed: 08/11/2012 [facility] Patient Information 2014 [place].

## 2014-04-13 ENCOUNTER — Encounter: Payer: Self-pay | Admitting: Gastroenterology

## 2014-05-06 ENCOUNTER — Encounter (HOSPITAL_COMMUNITY): Payer: Self-pay | Admitting: Emergency Medicine

## 2014-05-06 ENCOUNTER — Other Ambulatory Visit (HOSPITAL_COMMUNITY): Payer: Self-pay | Admitting: Internal Medicine

## 2014-05-06 ENCOUNTER — Emergency Department (HOSPITAL_COMMUNITY)
Admission: EM | Admit: 2014-05-06 | Discharge: 2014-05-06 | Disposition: A | Discharge status: Home / Self Care | Payer: Managed Care, Other (non HMO) | Attending: Emergency Medicine | Admitting: Emergency Medicine

## 2014-05-06 ENCOUNTER — Encounter (HOSPITAL_COMMUNITY): Payer: Self-pay

## 2014-05-06 ENCOUNTER — Ambulatory Visit (HOSPITAL_COMMUNITY)
Admission: RE | Admit: 2014-05-06 | Discharge: 2014-05-06 | Disposition: A | Discharge status: Home / Self Care | Payer: Managed Care, Other (non HMO) | Source: Ambulatory Visit | Attending: Internal Medicine | Admitting: Internal Medicine

## 2014-05-06 DIAGNOSIS — R310 Gross hematuria: Secondary | ICD-10-CM

## 2014-05-06 DIAGNOSIS — R1031 Right lower quadrant pain: Secondary | ICD-10-CM

## 2014-05-06 DIAGNOSIS — K219 Gastro-esophageal reflux disease without esophagitis: Secondary | ICD-10-CM

## 2014-05-06 DIAGNOSIS — N139 Obstructive and reflux uropathy, unspecified: Secondary | ICD-10-CM

## 2014-05-06 DIAGNOSIS — M129 Arthropathy, unspecified: Secondary | ICD-10-CM | POA: Insufficient documentation

## 2014-05-06 DIAGNOSIS — R1032 Left lower quadrant pain: Secondary | ICD-10-CM

## 2014-05-06 DIAGNOSIS — G8929 Other chronic pain: Secondary | ICD-10-CM

## 2014-05-06 DIAGNOSIS — K7689 Other specified diseases of liver: Secondary | ICD-10-CM | POA: Insufficient documentation

## 2014-05-06 DIAGNOSIS — N1330 Unspecified hydronephrosis: Secondary | ICD-10-CM | POA: Insufficient documentation

## 2014-05-06 DIAGNOSIS — IMO0002 Reserved for concepts with insufficient information to code with codable children: Secondary | ICD-10-CM | POA: Insufficient documentation

## 2014-05-06 DIAGNOSIS — N200 Calculus of kidney: Secondary | ICD-10-CM | POA: Insufficient documentation

## 2014-05-06 DIAGNOSIS — Z85038 Personal history of other malignant neoplasm of large intestine: Secondary | ICD-10-CM | POA: Insufficient documentation

## 2014-05-06 DIAGNOSIS — Z79899 Other long term (current) drug therapy: Secondary | ICD-10-CM | POA: Insufficient documentation

## 2014-05-06 DIAGNOSIS — Z791 Long term (current) use of non-steroidal anti-inflammatories (NSAID): Secondary | ICD-10-CM | POA: Insufficient documentation

## 2014-05-06 LAB — URINALYSIS, ROUTINE W REFLEX MICROSCOPIC
Bilirubin Urine: NEGATIVE
Glucose, UA: NEGATIVE mg/dL
Ketones, ur: NEGATIVE mg/dL
LEUKOCYTES UA: NEGATIVE
Nitrite: NEGATIVE
PH: 5 (ref 5.0–8.0)
Protein, ur: NEGATIVE mg/dL
SPECIFIC GRAVITY, URINE: 1.015 (ref 1.005–1.030)
Urobilinogen, UA: 0.2 mg/dL (ref 0.0–1.0)

## 2014-05-06 LAB — CBC WITH DIFFERENTIAL/PLATELET
BASOS ABS: 0 10*3/uL (ref 0.0–0.1)
Basophils Relative: 0 % (ref 0–1)
Eosinophils Absolute: 0.1 10*3/uL (ref 0.0–0.7)
Eosinophils Relative: 1 % (ref 0–5)
HCT: 34.4 % — ABNORMAL LOW (ref 39.0–52.0)
Hemoglobin: 12 g/dL — ABNORMAL LOW (ref 13.0–17.0)
LYMPHS ABS: 1.1 10*3/uL (ref 0.7–4.0)
LYMPHS PCT: 12 % (ref 12–46)
MCH: 29 pg (ref 26.0–34.0)
MCHC: 34.9 g/dL (ref 30.0–36.0)
MCV: 83.1 fL (ref 78.0–100.0)
Monocytes Absolute: 0.8 10*3/uL (ref 0.1–1.0)
Monocytes Relative: 9 % (ref 3–12)
Neutro Abs: 7.1 10*3/uL (ref 1.7–7.7)
Neutrophils Relative %: 78 % — ABNORMAL HIGH (ref 43–77)
PLATELETS: 188 10*3/uL (ref 150–400)
RBC: 4.14 MIL/uL — AB (ref 4.22–5.81)
RDW: 15.6 % — AB (ref 11.5–15.5)
WBC: 9.1 10*3/uL (ref 4.0–10.5)

## 2014-05-06 LAB — BASIC METABOLIC PANEL
Anion gap: 14 (ref 5–15)
BUN: 13 mg/dL (ref 6–23)
CHLORIDE: 104 meq/L (ref 96–112)
CO2: 20 meq/L (ref 19–32)
Calcium: 8.8 mg/dL (ref 8.4–10.5)
Creatinine, Ser: 1.11 mg/dL (ref 0.50–1.35)
GFR calc Af Amer: 78 mL/min — ABNORMAL LOW (ref >90)
GFR calc non Af Amer: 67 mL/min — ABNORMAL LOW (ref >90)
Glucose, Bld: 101 mg/dL — ABNORMAL HIGH (ref 70–99)
Potassium: 2.8 mEq/L — CL (ref 3.7–5.3)
SODIUM: 138 meq/L (ref 137–147)

## 2014-05-06 LAB — URINE MICROSCOPIC-ADD ON

## 2014-05-06 LAB — POCT I-STAT CREATININE: CREATININE: 1.2 mg/dL (ref 0.50–1.35)

## 2014-05-06 MED ORDER — OXYCODONE-ACETAMINOPHEN 5-325 MG PO TABS: 1.0000 | ORAL_TABLET | Freq: Four times a day (QID) | ORAL | Status: DC | PRN

## 2014-05-06 MED ORDER — ONDANSETRON HCL 4 MG/2ML IJ SOLN
4.0000 mg | Freq: Once | INTRAMUSCULAR | Status: AC
  Administered 2014-05-06: 4 mg via INTRAVENOUS
  Filled 2014-05-06: qty 2

## 2014-05-06 MED ORDER — POTASSIUM CHLORIDE CRYS ER 20 MEQ PO TBCR
40.0000 meq | EXTENDED_RELEASE_TABLET | Freq: Once | ORAL | Status: AC
  Administered 2014-05-06: 40 meq via ORAL
  Filled 2014-05-06: qty 2

## 2014-05-06 MED ORDER — IOHEXOL 300 MG/ML  SOLN
100.0000 mL | Freq: Once | INTRAMUSCULAR | Status: AC | PRN
  Administered 2014-05-06: 100 mL via INTRAVENOUS

## 2014-05-06 MED ORDER — HYDROMORPHONE HCL PF 1 MG/ML IJ SOLN
1.0000 mg | Freq: Once | INTRAMUSCULAR | Status: AC
  Administered 2014-05-06: 1 mg via INTRAVENOUS
  Filled 2014-05-06: qty 1

## 2014-05-06 MED ORDER — ONDANSETRON 4 MG PO TBDP: ORAL_TABLET | ORAL | Status: DC

## 2014-05-06 MED ORDER — KETOROLAC TROMETHAMINE 30 MG/ML IJ SOLN
15.0000 mg | Freq: Once | INTRAMUSCULAR | Status: AC
  Administered 2014-05-06: 15 mg via INTRAVENOUS
  Filled 2014-05-06: qty 1

## 2014-05-06 MED ORDER — IOHEXOL 350 MG/ML SOLN
100.0000 mL | Freq: Once | INTRAVENOUS | Status: DC | PRN
Start: 2014-05-06 — End: 2014-05-07

## 2014-05-06 NOTE — ED Notes (Signed)
CRITICAL VALUE ALERT  Critical value received:  K+ 2.8  Date of notification:  05/06/14  Time of notification:  1610  Critical value read back:Yes.    Nurse who received alert:  Felicity Coyer, RN  MD notified (1st page):    Time of first page:    MD notified (2nd page):  Time of second page:  Responding MD:  Dr. Milton Ferguson  Time MD responded:  [phone]

## 2014-05-06 NOTE — ED Notes (Signed)
C/o pain. MD Aware, orders obtained.

## 2014-05-06 NOTE — ED Notes (Signed)
Pt is a pt of Dr. Gerarda Fraction, sent for outpatient CT, positive for kidney stone, sent to ER for management of kidney stone.

## 2014-05-06 NOTE — ED Provider Notes (Signed)
CSN: [id number]     Arrival date & time 05/06/14  1447 History   First MD Initiated Contact with Patient 05/06/14 1501   This chart was scribed for Maudry Diego, MD by Rosary Lively, ED scribe. This patient was seen in room APA11/APA11 and the patient's care was started at 4:21 PM.    Chief Complaint  Patient presents with  . Nephrolithiasis   Patient is a 68 y.o. male presenting with flank pain. The history is provided by the patient (the pt complains of left flank pain). No language interpreter was used.  Flank Pain This is a new problem. The current episode started 12 to 24 hours ago. The problem occurs constantly. The problem has not changed since onset.Pertinent negatives include no chest pain, no abdominal pain and no headaches. Nothing aggravates the symptoms. Nothing relieves the symptoms.   HPI Comments:  Eduardo [name] is a 68 y.o. male who presents to the Emergency Department complaining of back pain   Past Medical History  Diagnosis Date  . Arthritis   . DDD (degenerative disc disease), cervical   . Wears glasses   . Neuropathy 2010  . History of seasonal allergies   . Headache(784.0)   . GERD (gastroesophageal reflux disease)   . Constipation   . Colon cancer 10/20/13    colon resection. Skin cancer removed from face   Past Surgical History  Procedure Laterality Date  . Hemorrhoid surgery    . Carpal tunnel release  2011    left  . Knee arthroscopy  2001    rt and lt  . Upper gi endoscopy    . Colonoscopy    . Inguinal hernia repair      right ([place])  . Tonsillectomy    . Carpal tunnel release Right 01/07/2013    Procedure: CARPAL TUNNEL RELEASE;  Surgeon: Tennis Must, MD;  Location: [facility];  Service: Orthopedics;  Laterality: Right;  . Colonoscopy N/A 10/18/2013    Procedure: COLONOSCOPY;  Surgeon: Danie Binder, MD;  [facility];  Service: Endoscopy;  Laterality: N/A;  2:00-moved to 1030 Pam to notify pt  . Colon  resection N/A 10/20/2013    Procedure:  LAPAROSCOPIC hand assisted partial colectomy;  Surgeon: Jamesetta So, MD;  Location: [facility];  Service: General;  Laterality: N/A;  . Appendectomy  10/20/13    (with right hemicolectomy)  . Inguinal hernia repair Bilateral     left x2 ([place], and one at [facility])  . Take down of intestinal fistula N/A 02/02/2014    Procedure: TAKE DOWN OF ENTEROCUTANEOUS  FISTULA;  Surgeon: Marcello Moores A. Cornett, MD;  Location: [facility];  Service: General;  Laterality: N/A;   Family History  Problem Relation Age of Onset  . Colon polyps Father   . Colon polyps Paternal Uncle   . Colon polyps Maternal Grandfather    History  Substance Use Topics  . Smoking status: Never Smoker   . Smokeless tobacco: Not on file  . Alcohol Use: No    Review of Systems  Constitutional: Negative for appetite change and fatigue.  HENT: Negative for congestion, ear discharge and sinus pressure.   Eyes: Negative for discharge.  Respiratory: Negative for cough.   Cardiovascular: Negative for chest pain.  Gastrointestinal: Negative for abdominal pain and diarrhea.  Genitourinary: Positive for flank pain. Negative for frequency and hematuria.  Musculoskeletal: Negative for back pain.  Skin: Negative for rash.  Neurological: Negative for seizures  and headaches.  Psychiatric/Behavioral: Negative for hallucinations.      Allergies  Niaspan and Tape  Home Medications   Prior to Admission medications   Medication Sig Start Date End Date Taking? Authorizing Provider  fluticasone (FLONASE) 50 MCG/ACT nasal spray Place 2 sprays into both nostrils daily. 09/28/13  Yes Historical Provider, MD  HYDROcodone-acetaminophen (NORCO/VICODIN) 5-325 MG per tablet Take 1 tablet by mouth every 6 (six) hours as needed for moderate pain.   Yes Historical Provider, MD  hydrocortisone (ANUSOL-HC) 25 MG suppository Place 25 mg rectally 2 (two) times daily.   Yes Historical Provider, MD  metoCLOPramide  (REGLAN) 10 MG tablet Take 1 tablet (10 mg total) by mouth 3 (three) times daily before meals. 02/15/14  Yes Thomas A. Cornett, MD  naproxen sodium (ANAPROX) 220 MG tablet Take 440 mg by mouth 2 (two) times daily as needed (migraine).   Yes Historical Provider, MD  omeprazole (PRILOSEC) 20 MG capsule Take 20 mg by mouth daily as needed (acid reflux).    Yes Historical Provider, MD  ondansetron (ZOFRAN ODT) 4 MG disintegrating tablet Take 1 tablet (4 mg total) by mouth every 8 (eight) hours as needed for nausea or vomiting. 02/14/14  Yes [name], MD  rizatriptan (MAXALT) 10 MG tablet Take 10 mg by mouth as needed for migraine. May repeat in 2 hours if needed   Yes Historical Provider, MD  VENTOLIN HFA 108 (90 BASE) MCG/ACT inhaler Inhale 1 puff into the lungs every 6 (six) hours as needed for wheezing or shortness of breath.  11/26/13  Yes Historical Provider, MD   BP 158/92  Pulse 75  Temp(Src) 97.7 F (36.5 C) (Oral)  Resp 18  SpO2 100% Physical Exam  Constitutional: He is oriented to person, place, and time. He appears well-developed.  HENT:  Head: Normocephalic.  Eyes: Conjunctivae and EOM are normal. No scleral icterus.  Neck: Neck supple. No thyromegaly present.  Cardiovascular: Normal rate and regular rhythm.  Exam reveals no gallop and no friction rub.   No murmur heard. Pulmonary/Chest: No stridor. He has no wheezes. He has no rales. He exhibits no tenderness.  Abdominal: He exhibits no distension. There is no tenderness. There is no rebound.  Genitourinary:  Tender left flank  Musculoskeletal: Normal range of motion. He exhibits no edema.  Lymphadenopathy:    He has no cervical adenopathy.  Neurological: He is oriented to person, place, and time. He exhibits normal muscle tone. Coordination normal.  Skin: No rash noted. No erythema.  Psychiatric: He has a normal mood and affect. His behavior is normal.    ED Course  Procedures  DIAGNOSTIC STUDIES: Oxygen Saturation is  100% on RA, normal by my interpretation.  COORDINATION OF CARE: 4:21 PM-Discussed treatment plan with pt at bedside and pt agreed to plan. Results for orders placed during the hospital encounter of 05/06/14  CBC WITH DIFFERENTIAL      Result Value Ref Range   WBC 9.1  4.0 - 10.5 K/uL   RBC 4.14 (*) 4.22 - 5.81 MIL/uL   Hemoglobin 12.0 (*) 13.0 - 17.0 g/dL   HCT 34.4 (*) 39.0 - 52.0 %   MCV 83.1  78.0 - 100.0 fL   MCH 29.0  26.0 - 34.0 pg   MCHC 34.9  30.0 - 36.0 g/dL   RDW 15.6 (*) 11.5 - 15.5 %   Platelets 188  150 - 400 K/uL   Neutrophils Relative % 78 (*) 43 - 77 %   Neutro  Abs 7.1  1.7 - 7.7 K/uL   Lymphocytes Relative 12  12 - 46 %   Lymphs Abs 1.1  0.7 - 4.0 K/uL   Monocytes Relative 9  3 - 12 %   Monocytes Absolute 0.8  0.1 - 1.0 K/uL   Eosinophils Relative 1  0 - 5 %   Eosinophils Absolute 0.1  0.0 - 0.7 K/uL   Basophils Relative 0  0 - 1 %   Basophils Absolute 0.0  0.0 - 0.1 K/uL  BASIC METABOLIC PANEL      Result Value Ref Range   Sodium 138  137 - 147 mEq/L   Potassium 2.8 (*) 3.7 - 5.3 mEq/L   Chloride 104  96 - 112 mEq/L   CO2 20  19 - 32 mEq/L   Glucose, Bld 101 (*) 70 - 99 mg/dL   BUN 13  6 - 23 mg/dL   Creatinine, Ser 1.11  0.50 - 1.35 mg/dL   Calcium 8.8  8.4 - 10.5 mg/dL   GFR calc non Af Amer 67 (*) >90 mL/min   GFR calc Af Amer 78 (*) >90 mL/min   Anion gap 14  5 - 15  URINALYSIS, ROUTINE W REFLEX MICROSCOPIC      Result Value Ref Range   Color, Urine YELLOW  YELLOW   APPearance CLEAR  CLEAR   Specific Gravity, Urine 1.015  1.005 - 1.030   pH 5.0  5.0 - 8.0   Glucose, UA NEGATIVE  NEGATIVE mg/dL   Hgb urine dipstick LARGE (*) NEGATIVE   Bilirubin Urine NEGATIVE  NEGATIVE   Ketones, ur NEGATIVE  NEGATIVE mg/dL   Protein, ur NEGATIVE  NEGATIVE mg/dL   Urobilinogen, UA 0.2  0.0 - 1.0 mg/dL   Nitrite NEGATIVE  NEGATIVE   Leukocytes, UA NEGATIVE  NEGATIVE  URINE MICROSCOPIC-ADD ON      Result Value Ref Range   Squamous Epithelial / LPF RARE  RARE    WBC, UA 0-2  <3 WBC/hpf   RBC / HPF 21-50  <3 RBC/hpf   Bacteria, UA RARE  RARE     Labs Review Labs Reviewed  CBC WITH DIFFERENTIAL - Abnormal; Notable for the following:    RBC 4.14 (*)    Hemoglobin 12.0 (*)    HCT 34.4 (*)    RDW 15.6 (*)    Neutrophils Relative % 78 (*)    All other components within normal limits  BASIC METABOLIC PANEL  URINALYSIS, ROUTINE W REFLEX MICROSCOPIC    Imaging Review Ct Abdomen Pelvis W Contrast  05/06/2014   CLINICAL DATA:  Left groin pain and hematuria.  EXAM: CT ABDOMEN AND PELVIS WITH CONTRAST  TECHNIQUE: Multidetector CT imaging of the abdomen and pelvis was performed using the standard protocol following bolus administration of intravenous contrast.  CONTRAST:  100 mL OMNIPAQUE IOHEXOL 300 MG/ML  SOLN  COMPARISON:  CT abdomen and pelvis 12/14/2013.  FINDINGS: There is some dependent atelectasis in the lung bases. No pleural or pericardial effusion.  There is delayed contrast excretion from the left kidney and moderate left hydronephrosis. Extensive stranding about the left kidney and ureter is identified. Changes are due to a 0.2 cm stone at the left ureterovesical junction. There appears to be a punctate nonobstructing stone in the lower pole of the left kidney. Cyst in the upper pole the right kidney is unchanged.  The liver is diffusely low attenuating consistent with fatty infiltration. No focal liver lesion is identified. The gallbladder, spleen, adrenal glands  and pancreas appear normal. The patient is status post right hemicolectomy with a surgical anastomosis seen in the right upper quadrant of the abdomen. Small and large bowel are otherwise unremarkable. No lymphadenopathy is seen. There is some scattered atherosclerotic vascular disease without aneurysm.  Bones demonstrate avascular necrosis of the femoral heads bilaterally without fragmentation or collapse.  IMPRESSION: Moderate left hydronephrosis due to a 0.2 cm stone at the left UVJ.   Punctate nonobstructing stone lower pole left kidney.  Diffuse fatty infiltration of the liver.  Avascular necrosis of the femoral heads bilaterally without fragmentation or collapse.  Status post right hemicolectomy without complicating feature   Electronically Signed   By: Inge Rise M.D.   On: 05/06/2014 14:48     EKG Interpretation None      MDM   Final diagnoses:  None    Kidney stone  The chart was scribed for me under my direct supervision.  I personally performed the history, physical, and medical decision making and all procedures in the evaluation of this patient.Maudry Diego, MD 05/06/14 [phone]

## 2014-05-06 NOTE — Discharge Instructions (Signed)
Follow up with [facility] next week °

## 2014-05-19 ENCOUNTER — Encounter (INDEPENDENT_AMBULATORY_CARE_PROVIDER_SITE_OTHER): Payer: Self-pay | Admitting: *Deleted

## 2014-07-05 ENCOUNTER — Ambulatory Visit (INDEPENDENT_AMBULATORY_CARE_PROVIDER_SITE_OTHER): Payer: Managed Care, Other (non HMO) | Admitting: Surgery

## 2014-08-08 ENCOUNTER — Encounter (INDEPENDENT_AMBULATORY_CARE_PROVIDER_SITE_OTHER): Payer: Self-pay | Admitting: Internal Medicine

## 2014-08-08 ENCOUNTER — Other Ambulatory Visit (INDEPENDENT_AMBULATORY_CARE_PROVIDER_SITE_OTHER): Payer: Self-pay | Admitting: *Deleted

## 2014-08-08 ENCOUNTER — Telehealth (INDEPENDENT_AMBULATORY_CARE_PROVIDER_SITE_OTHER): Payer: Self-pay | Admitting: *Deleted

## 2014-08-08 ENCOUNTER — Ambulatory Visit (INDEPENDENT_AMBULATORY_CARE_PROVIDER_SITE_OTHER): Payer: Managed Care, Other (non HMO) | Admitting: Internal Medicine

## 2014-08-08 VITALS — BP 110/68 | HR 78 | Temp 97.3°F | Resp 18 | Ht 74.0 in | Wt 208.9 lb

## 2014-08-08 DIAGNOSIS — Z85038 Personal history of other malignant neoplasm of large intestine: Secondary | ICD-10-CM

## 2014-08-08 DIAGNOSIS — R197 Diarrhea, unspecified: Secondary | ICD-10-CM

## 2014-08-08 DIAGNOSIS — C189 Malignant neoplasm of colon, unspecified: Secondary | ICD-10-CM

## 2014-08-08 MED ORDER — PEG-KCL-NACL-NASULF-NA ASC-C 100 G PO SOLR: 1.0000 | Freq: Once | ORAL | Status: DC

## 2014-08-08 MED ORDER — LOPERAMIDE HCL 2 MG PO CAPS: 2.0000 mg | ORAL_CAPSULE | Freq: Every day | ORAL | Status: DC | PRN

## 2014-08-08 NOTE — Progress Notes (Signed)
Presenting complaint;  History of colon carcinoma. Diarrhea.  History of present illness;  Patient is 68 year old Caucasian male who is referred through courtesy of Dr. Gerarda Fraction for GI care. Patient underwent screening colonoscopy by Dr.Fields on 10/18/2013 and is found to have a large mass in the region of hepatic flexure he also had sigmoid colon diverticulosis and external hemorrhoids. Biopsy revealed it to be adenocarcinoma arising out of sessile serrated polyp. Patient underwent a right hemicolectomy on 10/20/2013 by Dr. Aviva Signs. Patient presented one week later on 10/27/2013 to the emergency room and found to have pneumoperitoneum and intra-abdominal abscess felt to be secondary to anastomotic leak. Patient was begun on IV antibiotics, parenteral nutrition and underwent percutaneous drainage of an abscess. Patient was subsequently transferred to Dr. Tobe Sos service at [facility] on 11/03/2013. He was maintained on IV antibiotics, TNA and kept NPO. He developed enterocutaneous fistula which did not close after 8 weeks of conservative therapy and he finally underwent takedown of ileocolonic anastomoses with partial colectomy on 02/03/2014 with uneventful course and into recovery. Initial pathology revealed pT3 and N1a stage. One out of 19 lymph nodes was positive. He was evaluated by Dr. Tressie Stalker no therapy was recommended given without complication and long illness. He is seeing Dr. Tressie Stalker remains in remission. Patient is interested in having colonoscopy in December this year. He is having 3 loose stools per day. His baseline was constipation prior to this surgery. He has occasional nocturnal bowel movements. He denies melena or rectal bleeding he remains with mid abdominal pain which he describes as soreness. His soreness is gradually getting better. He has good appetite. He lost 40 pounds during this illness and has gained some back. He has intermittent heartburn and uses Prilosec on an  as-needed basis. He denies nausea vomiting or dysphagia.   Current Medications: Outpatient Encounter Prescriptions as of 08/08/2014  Medication Sig  . Cyanocobalamin (VITAMIN B-12 IJ) Inject as directed. Administered Monthly @@ Oncologist/[place]  . fluticasone (FLONASE) 50 MCG/ACT nasal spray Place 2 sprays into both nostrils daily.  Marland Kitchen HYDROcodone-acetaminophen (NORCO/VICODIN) 5-325 MG per tablet Take 1 tablet by mouth every 6 (six) hours as needed for moderate pain.  Marland Kitchen metoCLOPramide (REGLAN) 10 MG tablet Take 1 tablet (10 mg total) by mouth 3 (three) times daily before meals.  Marland Kitchen omeprazole (PRILOSEC) 20 MG capsule Take 20 mg by mouth daily as needed (acid reflux).   . Probiotic Product (PROBIOTIC DAILY PO) Take by mouth daily.  . rizatriptan (MAXALT) 10 MG tablet Take 10 mg by mouth as needed for migraine. May repeat in 2 hours if needed  . VENTOLIN HFA 108 (90 BASE) MCG/ACT inhaler Inhale 1 puff into the lungs every 6 (six) hours as needed for wheezing or shortness of breath.   . [DISCONTINUED] hydrocortisone (ANUSOL-HC) 25 MG suppository Place 25 mg rectally 2 (two) times daily.  . [DISCONTINUED] naproxen sodium (ANAPROX) 220 MG tablet Take 440 mg by mouth 2 (two) times daily as needed (migraine).  . [DISCONTINUED] ondansetron (ZOFRAN ODT) 4 MG disintegrating tablet Take 1 tablet (4 mg total) by mouth every 8 (eight) hours as needed for nausea or vomiting.  . [DISCONTINUED] ondansetron (ZOFRAN ODT) 4 MG disintegrating tablet 4mg  ODT q4 hours prn nausea/vomit  . [DISCONTINUED] oxyCODONE-acetaminophen (PERCOCET/ROXICET) 5-325 MG per tablet Take 1 tablet by mouth every 6 (six) hours as needed.   Past Medical History  Diagnosis Date  . Arthritis   . DDD (degenerative disc disease), cervical   . Wears glasses   .  Neuropathy 2010  . History of seasonal allergies   . Headache(784.0)   . GERD (gastroesophageal reflux disease)   . Constipation   . Colon cancer 10/20/13    colon resection.  Skin cancer removed from face   Past Surgical History  Procedure Laterality Date  . Hemorrhoid surgery    . Carpal tunnel release  2011    left  . Knee arthroscopy  2001    rt and lt  . Upper gi endoscopy    . Colonoscopy    . Inguinal hernia repair      right ([place])  . Tonsillectomy    . Carpal tunnel release Right 01/07/2013    Procedure: CARPAL TUNNEL RELEASE;  Surgeon: Tennis Must, MD;  Location: [facility];  Service: Orthopedics;  Laterality: Right;  . Colonoscopy N/A 10/18/2013    Procedure: COLONOSCOPY;  Surgeon: Danie Binder, MD;  [facility];  Service: Endoscopy;  Laterality: N/A;  2:00-moved to 1030 Pam to notify pt  . Colon resection N/A 10/20/2013    Procedure:  LAPAROSCOPIC hand assisted partial colectomy;  Surgeon: Jamesetta So, MD;  Location[facility];  Service: General;  Laterality: N/A;  . Appendectomy  10/20/13    (with right hemicolectomy)  . Inguinal hernia repair Bilateral     left x2 ([place], and one at [facility])  . Take down of intestinal fistula N/A 02/02/2014    Procedure: TAKE DOWN OF ENTEROCUTANEOUS  FISTULA;  Surgeon: Marcello Moores A. Cornett, MD;  Location: [facility];  Service: General;  Laterality: N/A;   Allergies; Allergies  Allergen Reactions  . Niaspan [Niacin Er] Rash  . Tape Rash    Paper tape OK   Family history;  Father was 76 years old and died of small bowel cancer possibly adenocarcinoma. Mother had emphysema and died at 51 of uterine cancer. He had one brother who died of auto accident at age 75.  Social history;  He is married and accompanied by his wife(second marriage). He has daughter age 77 from first marriage and a 64 year old adopted child. 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 He's been working for 43 years. He has never smoked cigarettes and drinks alcohol occasionally.  Physical examination;  Blood pressure 110/68, pulse 78,  temperature 97.3 F (36.3 C), temperature source Oral, resp. rate 18, height 6\' 2"  (1.88 m), weight 208 lb 14.4 oz (94.756 kg). Patient is alert and in no acute distress. Conjunctiva is pink. Sclera is nonicteric Oropharyngeal mucosa is normal. No neck masses or thyromegaly noted. Cardiac exam with regular rhythm normal S1 and S2. No murmur or gallop noted. Lungs are clear to auscultation. Abdomen is symmetrical with midline scar. Bowel sounds are normal. On palpation abdomen is soft with mild peri-umbilicus tenderness but no organomegaly or masses.  No LE edema or clubbing noted.  Labs/studies Results: Recent lab data not available. CBC from 05/06/2014  WC 9.1, H&H 12 and 34.4 and platelet count 188K.   Assessment:   History of adenocarcinoma of colon, status post right hemicolectomy in December 2993 complicated by anastomotic leak and abscess treated with percutaneous drain IV antibiotics and TNA and finally leading to takedown of the ileocolonic enterocutaneous fistula with partial colectomy in April, 2015. Patient remains in remission and under care of Dr.Neijstrom. He has chronic mild diarrhea which should improve with intestinal adaptation. Symptoms are not suggestive of small intestinal bacterial overgrowth for which he is  at risk because of loss of ileocecal valve. For now will treat him symptomatically and monitor his course. He will be due for surveillance colonoscopy in December this year.   Recommendations;  Loperamide OTC 2 mg by mouth every morning. Surveillance colonoscopy will be phoned in December 2015.

## 2014-08-08 NOTE — Patient Instructions (Signed)
Colonoscopy to be scheduled in December 2015

## 2014-08-08 NOTE — Telephone Encounter (Signed)
Patient needs movi prep 

## 2014-09-22 ENCOUNTER — Encounter (HOSPITAL_COMMUNITY)
Admission: RE | Disposition: A | Discharge status: Home / Self Care | Payer: Self-pay | Source: Ambulatory Visit | Attending: Internal Medicine

## 2014-09-22 ENCOUNTER — Ambulatory Visit (HOSPITAL_COMMUNITY)
Admission: RE | Admit: 2014-09-22 | Discharge: 2014-09-22 | Disposition: A | Discharge status: Home / Self Care | Payer: Managed Care, Other (non HMO) | Source: Ambulatory Visit | Attending: Internal Medicine | Admitting: Internal Medicine

## 2014-09-22 ENCOUNTER — Encounter (HOSPITAL_COMMUNITY): Payer: Self-pay

## 2014-09-22 DIAGNOSIS — M1990 Unspecified osteoarthritis, unspecified site: Secondary | ICD-10-CM | POA: Insufficient documentation

## 2014-09-22 DIAGNOSIS — Z85828 Personal history of other malignant neoplasm of skin: Secondary | ICD-10-CM | POA: Diagnosis not present

## 2014-09-22 DIAGNOSIS — K5730 Diverticulosis of large intestine without perforation or abscess without bleeding: Secondary | ICD-10-CM | POA: Insufficient documentation

## 2014-09-22 DIAGNOSIS — K5900 Constipation, unspecified: Secondary | ICD-10-CM | POA: Insufficient documentation

## 2014-09-22 DIAGNOSIS — M5030 Other cervical disc degeneration, unspecified cervical region: Secondary | ICD-10-CM | POA: Diagnosis not present

## 2014-09-22 DIAGNOSIS — G629 Polyneuropathy, unspecified: Secondary | ICD-10-CM | POA: Insufficient documentation

## 2014-09-22 DIAGNOSIS — K219 Gastro-esophageal reflux disease without esophagitis: Secondary | ICD-10-CM | POA: Insufficient documentation

## 2014-09-22 DIAGNOSIS — Z1211 Encounter for screening for malignant neoplasm of colon: Secondary | ICD-10-CM | POA: Diagnosis present

## 2014-09-22 DIAGNOSIS — Z85038 Personal history of other malignant neoplasm of large intestine: Secondary | ICD-10-CM

## 2014-09-22 DIAGNOSIS — R51 Headache: Secondary | ICD-10-CM | POA: Insufficient documentation

## 2014-09-22 DIAGNOSIS — Z8371 Family history of colonic polyps: Secondary | ICD-10-CM | POA: Insufficient documentation

## 2014-09-22 DIAGNOSIS — R197 Diarrhea, unspecified: Secondary | ICD-10-CM

## 2014-09-22 DIAGNOSIS — Z86010 Personal history of colonic polyps: Secondary | ICD-10-CM | POA: Insufficient documentation

## 2014-09-22 HISTORY — PX: COLONOSCOPY: SHX5424

## 2014-09-22 SURGERY — COLONOSCOPY
Anesthesia: Moderate Sedation

## 2014-09-22 MED ORDER — MIDAZOLAM HCL 5 MG/5ML IJ SOLN
INTRAMUSCULAR | Status: AC
  Filled 2014-09-22: qty 10

## 2014-09-22 MED ORDER — SIMETHICONE 40 MG/0.6ML PO SUSP
ORAL | Status: DC | PRN
  Administered 2014-09-22: 12:00:00

## 2014-09-22 MED ORDER — MEPERIDINE HCL 50 MG/ML IJ SOLN
INTRAMUSCULAR | Status: DC | PRN
  Administered 2014-09-22 (×2): 25 mg via INTRAVENOUS

## 2014-09-22 MED ORDER — MEPERIDINE HCL 50 MG/ML IJ SOLN
INTRAMUSCULAR | Status: AC
  Filled 2014-09-22: qty 1

## 2014-09-22 MED ORDER — SODIUM CHLORIDE 0.9 % IV SOLN
INTRAVENOUS | Status: DC
  Administered 2014-09-22: 11:00:00 via INTRAVENOUS

## 2014-09-22 MED ORDER — MIDAZOLAM HCL 5 MG/5ML IJ SOLN
INTRAMUSCULAR | Status: DC | PRN
  Administered 2014-09-22 (×3): 2 mg via INTRAVENOUS
  Administered 2014-09-22: 3 mg via INTRAVENOUS

## 2014-09-22 NOTE — Discharge Instructions (Signed)
Resume usual medications and diet. Can take Imodium OTC 2 mg by mouth daily as needed. No driving for 24 hours. Next colonoscopy in 3 years.  Colonoscopy, Care After Refer to this sheet in the next few weeks. These instructions provide you with information on caring for yourself after your procedure. Your health care provider may also give you more specific instructions. Your treatment has been planned according to current medical practices, but problems sometimes occur. Call your health care provider if you have any problems or questions after your procedure. WHAT TO EXPECT AFTER THE PROCEDURE  After your procedure, it is typical to have the following:  A small amount of blood in your stool.  Moderate amounts of gas and mild abdominal cramping or bloating. HOME CARE INSTRUCTIONS  Do not drive, operate machinery, or sign important documents for 24 hours.  You may shower and resume your regular physical activities, but move at a slower pace for the first 24 hours.  Take frequent rest periods for the first 24 hours.  Walk around or put a warm pack on your abdomen to help reduce abdominal cramping and bloating.  Drink enough fluids to keep your urine clear or pale yellow.  You may resume your normal diet as instructed by your health care provider. Avoid heavy or fried foods that are hard to digest.  Avoid drinking alcohol for 24 hours or as instructed by your health care provider.  Only take over-the-counter or prescription medicines as directed by your health care provider.  If a tissue sample (biopsy) was taken during your procedure:  Do not take aspirin or blood thinners for 7 days, or as instructed by your health care provider.  Do not drink alcohol for 7 days, or as instructed by your health care provider.  Eat soft foods for the first 24 hours. SEEK MEDICAL CARE IF: You have persistent spotting of blood in your stool 2-3 days after the procedure. SEEK IMMEDIATE MEDICAL CARE  IF:  You have more than a small spotting of blood in your stool.  You pass large blood clots in your stool.  Your abdomen is swollen (distended).  You have nausea or vomiting.  You have a fever.  You have increasing abdominal pain that is not relieved with medicine. Document Released: 05/21/2004 Document Revised: 07/28/2013 Document Reviewed: 06/14/2013 [facility] Patient Information 2015 [place], [place]. This information is not intended to replace advice given to you by your health care provider. Make sure you discuss any questions you have with your health care provider.   Diverticulosis Diverticulosis is the condition that develops when small pouches (diverticula) form in the wall of your colon. Your colon, or large intestine, is where water is absorbed and stool is formed. The pouches form when the inside layer of your colon pushes through weak spots in the outer layers of your colon. CAUSES  No one knows exactly what causes diverticulosis. RISK FACTORS  Being older than 84. Your risk for this condition increases with age. Diverticulosis is rare in people younger than 40 years. By age 62, almost everyone has it.  Eating a low-fiber diet.  Being frequently constipated.  Being overweight.  Not getting enough exercise.  Smoking.  Taking over-the-counter pain medicines, like aspirin and ibuprofen. SYMPTOMS  Most people with diverticulosis do not have symptoms. DIAGNOSIS  Because diverticulosis often has no symptoms, health care providers often discover the condition during an exam for other colon problems. In many cases, a health care provider will diagnose diverticulosis while using  a flexible scope to examine the colon (colonoscopy). TREATMENT  If you have never developed an infection related to diverticulosis, you may not need treatment. If you have had an infection before, treatment may include:  Eating more fruits, vegetables, and grains.  Taking a fiber  supplement.  Taking a live bacteria supplement (probiotic).  Taking medicine to relax your colon. HOME CARE INSTRUCTIONS   Drink at least 6-8 glasses of water each day to prevent constipation.  Try not to strain when you have a bowel movement.  Keep all follow-up appointments. If you have had an infection before:  Increase the fiber in your diet as directed by your health care provider or dietitian.  Take a dietary fiber supplement if your health care provider approves.  Only take medicines as directed by your health care provider. SEEK MEDICAL CARE IF:   You have abdominal pain.  You have bloating.  You have cramps.  You have not gone to the bathroom in 3 days. SEEK IMMEDIATE MEDICAL CARE IF:   Your pain gets worse.  Yourbloating becomes very bad.  You have a fever or chills, and your symptoms suddenly get worse.  You begin vomiting.  You have bowel movements that are bloody or black. MAKE SURE YOU:  Understand these instructions.  Will watch your condition.  Will get help right away if you are not doing well or get worse. Document Released: 07/04/2004 Document Revised: 10/12/2013 Document Reviewed: 09/01/2013 [facility] Patient Information 2015 [place], [place]. This information is not intended to replace advice given to you by your health care provider. Make sure you discuss any questions you have with your health care provider.

## 2014-09-22 NOTE — Op Note (Signed)
COLONOSCOPY PROCEDURE REPORT  PATIENT:  Eduardo [name]  MR#:  [id number] Birthdate:  Jul 04, 1946, 68 y.o., male Endoscopist:  Dr. Rogene Houston, MD Referred By:  Dr. [name], MD  Procedure Date: 09/22/2014  Procedure:   Colonoscopy  Indications: Patient is 68 year old Caucasian male who is here for surveillance colonoscopy. He is status post right hemicolectomy in December 2014 for ascending colon carcinoma arising out of sessile serrated polyp. He has intermittent diarrhea but denies abdominal pain or rectal bleeding.  Informed Consent:  The procedure and risks were reviewed with the patient and informed consent was obtained.  Medications:  Demerol 50 mg IV Versed 9 mg IV  Description of procedure:  After a digital rectal exam was performed, that colonoscope was advanced from the anus through the rectum and colon to the area of hepatic flexure where ileocolonic anastomosis was identified. These structures were well-seen and photographed for the record. Sope was slowly and cautiously withdrawn. The mucosal surfaces were carefully surveyed utilizing scope tip to flexion to facilitate fold flattening as needed. The scope was pulled down into the rectum where a thorough exam including retroflexion was performed.  Findings:   Prep satisfactory. Wide-open ileocolonic anastomosis located in the region of hepatic flexure. Two diverticula at transverse colon. No polyps or other abnormalities identified. Normal rectal mucosa and anorectal junction.   Therapeutic/Diagnostic Maneuvers Performed:   None  Complications:  None  Colon Withdrawal Time:  11 minutes  Impression:  Two small diverticula at transverse colon otherwise normal colonoscopy with wide-open ileocolonic anastomosis.  Recommendations:  Standard instructions given. Imodium OTC 2 mg by mouth daily as needed. Next colonoscopy in 3 years.    Mak Bonny U  09/22/2014 12:25 PM  CC: Dr. [name]., MD & Dr.  Rayne Du ref. provider found CC:  Dr. Everardo All, MD

## 2014-09-22 NOTE — H&P (Signed)
Eduardo [name] is an 68 y.o. male.   Chief Complaint: Patient is here for colonoscopy. HPI: Patient is 68 year old Caucasian male with history of CRC. He status post right hemicolectomy in December 2014. Surgery was complicated by anastomotic leak requiring antibiotics with percutaneous drainage. Subsequently he required takedown of enterocutaneous fistula(April 2015)   He did not require adjuvant chemotherapy. He remains in remission. He has diuresed 3 stools per day. He denies abdominal pain rectal bleeding.  Past Medical History  Diagnosis Date  . Arthritis   . DDD (degenerative disc disease), cervical   . Wears glasses   . Neuropathy 2010  . History of seasonal allergies   . Headache(784.0)   . GERD (gastroesophageal reflux disease)   . Constipation   . Colon cancer 10/20/13    colon resection. Skin cancer removed from face    Past Surgical History  Procedure Laterality Date  . Hemorrhoid surgery    . Carpal tunnel release  2011    left  . Knee arthroscopy  2001    rt and lt  . Upper gi endoscopy    . Colonoscopy    . Inguinal hernia repair      right ([place])  . Tonsillectomy    . Carpal tunnel release Right 01/07/2013    Procedure: CARPAL TUNNEL RELEASE;  Surgeon: Tennis Must, MD;  Location: [facility];  Service: Orthopedics;  Laterality: Right;  . Colonoscopy N/A 10/18/2013    Procedure: COLONOSCOPY;  Surgeon: Danie Binder, MD;  [facility];  Service: Endoscopy;  Laterality: N/A;  2:00-moved to 1030 Pam to notify pt  . Colon resection N/A 10/20/2013    Procedure:  LAPAROSCOPIC hand assisted partial colectomy;  Surgeon: Jamesetta So, MD;  Location: [facility];  Service: General;  Laterality: N/A;  . Appendectomy  10/20/13    (with right hemicolectomy)  . Inguinal hernia repair Bilateral     left x2 ([place], and one at [facility])  . Take down of intestinal fistula N/A 02/02/2014    Procedure: TAKE DOWN OF ENTEROCUTANEOUS  FISTULA;  Surgeon: Marcello Moores  A. Cornett, MD;  Location: [facility];  Service: General;  Laterality: N/A;    Family History  Problem Relation Age of Onset  . Colon polyps Father   . Colon polyps Paternal Uncle   . Colon polyps Maternal Grandfather    Social History:  reports that he has never smoked. He has never used smokeless tobacco. He reports that he does not drink alcohol or use illicit drugs.  Allergies:  Allergies  Allergen Reactions  . Niaspan [Niacin Er] Rash  . Tape Rash    Paper tape OK    Medications Prior to Admission  Medication Sig Dispense Refill  . Cyanocobalamin (VITAMIN B-12 IJ) Inject as directed. Administered Monthly @@ Oncologist/[place]    . fluticasone (FLONASE) 50 MCG/ACT nasal spray Place 2 sprays into both nostrils daily.    Marland Kitchen HYDROcodone-acetaminophen (NORCO/VICODIN) 5-325 MG per tablet Take 1 tablet by mouth every 6 (six) hours as needed for moderate pain.    Marland Kitchen loperamide (IMODIUM) 2 MG capsule Take 1 capsule (2 mg total) by mouth daily as needed for diarrhea or loose stools. 30 capsule 5  . omeprazole (PRILOSEC) 20 MG capsule Take 20 mg by mouth daily as needed (acid reflux).     . peg 3350 powder (MOVIPREP) 100 G SOLR Take 1 kit (200 g total) by mouth once. 1 kit 0  . Probiotic Product (PROBIOTIC  DAILY PO) Take 1 capsule by mouth daily.     . rizatriptan (MAXALT) 10 MG tablet Take 10 mg by mouth as needed for migraine. May repeat in 2 hours if needed    . VENTOLIN HFA 108 (90 BASE) MCG/ACT inhaler Inhale 1 puff into the lungs every 6 (six) hours as needed for wheezing or shortness of breath.       No results found for this or any previous visit (from the past 48 hour(s)). No results found.  ROS  Blood pressure 125/86, pulse 63, temperature 97.7 F (36.5 C), temperature source Oral, resp. rate 16, height 6' 2"  (1.88 m), weight 208 lb (94.348 kg), SpO2 98 %. Physical Exam  Constitutional: He appears well-developed and well-nourished.  HENT:  Mouth/Throat: Oropharynx is clear and  moist.  Eyes: Conjunctivae are normal. No scleral icterus.  Neck: No thyromegaly present.  Cardiovascular: Normal rate, regular rhythm and normal heart sounds.   No murmur heard. Respiratory: Effort normal and breath sounds normal.  GI: Soft. He exhibits no distension and no mass. There is no tenderness.  Midline scar.  Musculoskeletal: He exhibits no edema.  Lymphadenopathy:    He has no cervical adenopathy.  Neurological: He is alert.  Skin: Skin is warm and dry.     Assessment/Plan History of CRC. Status post right hemicolectomy one year ago. Surveillance colonoscopy.  Jalene Demo U 09/22/2014, 11:42 AM

## 2014-09-26 ENCOUNTER — Encounter (HOSPITAL_COMMUNITY): Payer: Self-pay | Admitting: Internal Medicine

## 2014-11-28 ENCOUNTER — Emergency Department (HOSPITAL_COMMUNITY)
Admission: EM | Admit: 2014-11-28 | Discharge: 2014-11-29 | Disposition: A | Discharge status: Home / Self Care | Payer: Managed Care, Other (non HMO) | Attending: Emergency Medicine | Admitting: Emergency Medicine

## 2014-11-28 ENCOUNTER — Emergency Department (HOSPITAL_COMMUNITY): Payer: Managed Care, Other (non HMO)

## 2014-11-28 ENCOUNTER — Encounter (HOSPITAL_COMMUNITY): Payer: Self-pay | Admitting: *Deleted

## 2014-11-28 DIAGNOSIS — R109 Unspecified abdominal pain: Secondary | ICD-10-CM

## 2014-11-28 DIAGNOSIS — Z7951 Long term (current) use of inhaled steroids: Secondary | ICD-10-CM | POA: Insufficient documentation

## 2014-11-28 DIAGNOSIS — Z85038 Personal history of other malignant neoplasm of large intestine: Secondary | ICD-10-CM | POA: Insufficient documentation

## 2014-11-28 DIAGNOSIS — Z8709 Personal history of other diseases of the respiratory system: Secondary | ICD-10-CM | POA: Insufficient documentation

## 2014-11-28 DIAGNOSIS — Z79899 Other long term (current) drug therapy: Secondary | ICD-10-CM | POA: Insufficient documentation

## 2014-11-28 DIAGNOSIS — K219 Gastro-esophageal reflux disease without esophagitis: Secondary | ICD-10-CM | POA: Diagnosis not present

## 2014-11-28 DIAGNOSIS — Z87442 Personal history of urinary calculi: Secondary | ICD-10-CM | POA: Diagnosis not present

## 2014-11-28 DIAGNOSIS — N201 Calculus of ureter: Secondary | ICD-10-CM

## 2014-11-28 DIAGNOSIS — M1990 Unspecified osteoarthritis, unspecified site: Secondary | ICD-10-CM | POA: Diagnosis not present

## 2014-11-28 HISTORY — DX: Calculus of kidney: N20.0

## 2014-11-28 LAB — CBC WITH DIFFERENTIAL/PLATELET
BASOS ABS: 0 10*3/uL (ref 0.0–0.1)
BASOS PCT: 0 % (ref 0–1)
EOS ABS: 0.1 10*3/uL (ref 0.0–0.7)
Eosinophils Relative: 1 % (ref 0–5)
HEMATOCRIT: 41.6 % (ref 39.0–52.0)
HEMOGLOBIN: 14.1 g/dL (ref 13.0–17.0)
LYMPHS ABS: 2.4 10*3/uL (ref 0.7–4.0)
Lymphocytes Relative: 19 % (ref 12–46)
MCH: 30.1 pg (ref 26.0–34.0)
MCHC: 33.9 g/dL (ref 30.0–36.0)
MCV: 88.9 fL (ref 78.0–100.0)
MONO ABS: 1 10*3/uL (ref 0.1–1.0)
Monocytes Relative: 8 % (ref 3–12)
Neutro Abs: 9.2 10*3/uL — ABNORMAL HIGH (ref 1.7–7.7)
Neutrophils Relative %: 72 % (ref 43–77)
PLATELETS: 208 10*3/uL (ref 150–400)
RBC: 4.68 MIL/uL (ref 4.22–5.81)
RDW: 13 % (ref 11.5–15.5)
WBC: 12.8 10*3/uL — AB (ref 4.0–10.5)

## 2014-11-28 LAB — URINALYSIS, ROUTINE W REFLEX MICROSCOPIC
Bilirubin Urine: NEGATIVE
Glucose, UA: NEGATIVE mg/dL
Ketones, ur: NEGATIVE mg/dL
Leukocytes, UA: NEGATIVE
Nitrite: NEGATIVE
Protein, ur: NEGATIVE mg/dL
Specific Gravity, Urine: >1.03 — ABNORMAL HIGH (ref 1.005–1.030)
Urobilinogen, UA: 0.2 mg/dL (ref 0.0–1.0)
pH: 5.5 (ref 5.0–8.0)

## 2014-11-28 LAB — BASIC METABOLIC PANEL
Anion gap: 5 (ref 5–15)
BUN: 21 mg/dL (ref 6–23)
CO2: 23 mmol/L (ref 19–32)
CREATININE: 1.21 mg/dL (ref 0.50–1.35)
Calcium: 9.5 mg/dL (ref 8.4–10.5)
Chloride: 109 mmol/L (ref 96–112)
GFR, EST AFRICAN AMERICAN: 70 mL/min — AB (ref >90)
GFR, EST NON AFRICAN AMERICAN: 61 mL/min — AB (ref >90)
Glucose, Bld: 109 mg/dL — ABNORMAL HIGH (ref 70–99)
POTASSIUM: 3.8 mmol/L (ref 3.5–5.1)
Sodium: 137 mmol/L (ref 135–145)

## 2014-11-28 LAB — URINE MICROSCOPIC-ADD ON

## 2014-11-28 MED ORDER — SODIUM CHLORIDE 0.9 % IV BOLUS (SEPSIS)
500.0000 mL | Freq: Once | INTRAVENOUS | Status: AC
Start: 2014-11-28 — End: 2014-11-29
  Administered 2014-11-28: 500 mL via INTRAVENOUS

## 2014-11-28 MED ORDER — TAMSULOSIN HCL 0.4 MG PO CAPS
0.4000 mg | ORAL_CAPSULE | Freq: Once | ORAL | Status: AC
  Administered 2014-11-28: 0.4 mg via ORAL
  Filled 2014-11-28: qty 1

## 2014-11-28 MED ORDER — ONDANSETRON HCL 4 MG/2ML IJ SOLN
4.0000 mg | Freq: Once | INTRAMUSCULAR | Status: DC
  Filled 2014-11-28: qty 2

## 2014-11-28 MED ORDER — ONDANSETRON HCL 4 MG/2ML IJ SOLN
4.0000 mg | Freq: Once | INTRAMUSCULAR | Status: AC
  Administered 2014-11-28: 4 mg via INTRAVENOUS

## 2014-11-28 MED ORDER — HYDROMORPHONE HCL 1 MG/ML IJ SOLN
1.0000 mg | Freq: Once | INTRAMUSCULAR | Status: AC
  Administered 2014-11-28: 1 mg via INTRAVENOUS
  Filled 2014-11-28: qty 1

## 2014-11-28 MED ORDER — ONDANSETRON HCL 4 MG/2ML IJ SOLN
4.0000 mg | Freq: Once | INTRAMUSCULAR | Status: AC
  Administered 2014-11-28: 4 mg via INTRAVENOUS
  Filled 2014-11-28: qty 2

## 2014-11-28 NOTE — ED Notes (Signed)
Patient medicated per order for pain; states pain is decreasing after being given pain mediation.

## 2014-11-28 NOTE — ED Notes (Signed)
Pain rt flank, onset this am,  Nausea, no vomiting, Hx of kidney stones

## 2014-11-28 NOTE — ED Provider Notes (Signed)
CSN: [id number]     Arrival date & time 11/28/14  1855 History   First MD Initiated Contact with Patient 11/28/14 2309    This chart was scribed for Eduardo Norrie, MD by Terressa Koyanagi, ED Scribe. This patient was seen in room [facility] and the patient's care was started at 11:15 PM.  Chief Complaint  Patient presents with  . Flank Pain   HPI PCP: [name]., MD HPI Comments: Eduardo [name] is a 69 y.o. male, with PMH noted below including Hx of kidney stones (July 2015 left sided kidney stone), who presents to the Emergency Department complaining of worsening, atraumatic right flank pain onset this morning around 11 am. The pain has been constant all day but has been getting progressively worse. Pt also complains of nausea with dry heaves but denies vomiting. Pt denies anything makes the pain worse or better. Pt specifically denies fever, blood in urine, weakness, dizziness, or any other Sx.   Pt notes he saw an urologist in 01/2014, but does not have a regular urologist. (was seen by Dr Jeffie Pollock while in the hospital)   PCP Dr Gerarda Fraction  Past Medical History  Diagnosis Date  . Arthritis   . DDD (degenerative disc disease), cervical   . Wears glasses   . Neuropathy 2010  . History of seasonal allergies   . Headache(784.0)   . GERD (gastroesophageal reflux disease)   . Constipation   . Colon cancer 10/20/13    colon resection. Skin cancer removed from face  . Kidney stone    Past Surgical History  Procedure Laterality Date  . Hemorrhoid surgery    . Carpal tunnel release  2011    left  . Knee arthroscopy  2001    rt and lt  . Upper gi endoscopy    . Colonoscopy    . Inguinal hernia repair      right ([place])  . Tonsillectomy    . Carpal tunnel release Right 01/07/2013    Procedure: CARPAL TUNNEL RELEASE;  Surgeon: Tennis Must, MD;  Location: [facility];  Service: Orthopedics;  Laterality: Right;  . Colonoscopy N/A 10/18/2013    Procedure: COLONOSCOPY;   Surgeon: Danie Binder, MD;  [facility];  Service: Endoscopy;  Laterality: N/A;  2:00-moved to 1030 Pam to notify pt  . Colon resection N/A 10/20/2013    Procedure:  LAPAROSCOPIC hand assisted partial colectomy;  Surgeon: Jamesetta So, MD;  Location: [facility];  Service: General;  Laterality: N/A;  . Appendectomy  10/20/13    (with right hemicolectomy)  . Inguinal hernia repair Bilateral     left x2 ([place], and one at [facility])  . Take down of intestinal fistula N/A 02/02/2014    Procedure: TAKE DOWN OF ENTEROCUTANEOUS  FISTULA;  Surgeon: Marcello Moores A. Cornett, MD;  Location: [facility];  Service: General;  Laterality: N/A;  . Colonoscopy N/A 09/22/2014    Procedure: COLONOSCOPY;  Surgeon: Rogene Houston, MD;  [facility];  Service: Endoscopy;  Laterality: N/A;  1200   Family History  Problem Relation Age of Onset  . Colon polyps Father   . Colon polyps Paternal Uncle   . Colon polyps Maternal Grandfather    History  Substance Use Topics  . Smoking status: Never Smoker   . Smokeless tobacco: Never Used  . Alcohol Use: No  lives at home Lives with spouse and daughter  Review of Systems  Constitutional: Negative for fever.  Gastrointestinal: Positive for nausea. Negative for vomiting.  Genitourinary: Positive for flank pain (right flank pain). Negative for hematuria.  Neurological: Negative for dizziness and weakness.  All other systems reviewed and are negative.  Allergies  Niaspan and Tape  Home Medications   Prior to Admission medications   Medication Sig Start Date End Date Taking? Authorizing Provider  AMITIZA 24 MCG capsule Take 1 capsule by mouth 2 (two) times daily with a meal. 11/09/14  Yes Historical Provider, MD  Cyanocobalamin (VITAMIN B-12 IJ) Inject as directed every 30 (thirty) days. Administered Monthly @@ Oncologist/[facility]   Yes Historical Provider, MD  fluticasone (FLONASE) 50 MCG/ACT nasal spray Place 2 sprays into both nostrils daily. 09/28/13  Yes  Historical Provider, MD  HYDROcodone-acetaminophen (NORCO/VICODIN) 5-325 MG per tablet Take 1 tablet by mouth every 6 (six) hours as needed for moderate pain.   Yes Historical Provider, MD  LYRICA 75 MG capsule Take 75 mg by mouth daily as needed. For pain 11/22/14  Yes Historical Provider, MD  potassium chloride SA (K-DUR,KLOR-CON) 20 MEQ tablet Take 20 mEq by mouth 3 (three) times daily. 08/22/14  Yes Historical Provider, MD  Probiotic Product (PROBIOTIC DAILY PO) Take 1 capsule by mouth daily.    Yes Historical Provider, MD  ranitidine (ZANTAC) 300 MG tablet Take 300 mg by mouth. 11/22/14  Yes Historical Provider, MD  loperamide (IMODIUM) 2 MG capsule Take 1 capsule (2 mg total) by mouth daily as needed for diarrhea or loose stools. Patient not taking: Reported on 11/28/2014 08/08/14   Rogene Houston, MD  ondansetron (ZOFRAN) 4 MG tablet Take 1 tablet (4 mg total) by mouth every 8 (eight) hours as needed. 11/29/14   Eduardo Norrie, MD  oxyCODONE-acetaminophen (PERCOCET/ROXICET) 5-325 MG per tablet Take 1-2 tablets by mouth every 6 (six) hours as needed for severe pain. 11/29/14   Eduardo Norrie, MD  oxyCODONE-acetaminophen (PERCOCET/ROXICET) 5-325 MG per tablet Take 1-2 tablets by mouth every 6 (six) hours as needed for moderate pain or severe pain. 11/29/14   Eduardo Norrie, MD  rizatriptan (MAXALT) 10 MG tablet Take 10 mg by mouth as needed for migraine. May repeat in 2 hours if needed    Historical Provider, MD  tamsulosin (FLOMAX) 0.4 MG CAPS capsule Take 1 po QD until you pass the stone. 11/29/14   Eduardo Norrie, MD  VENTOLIN HFA 108 (90 BASE) MCG/ACT inhaler Inhale 1 puff into the lungs every 6 (six) hours as needed for wheezing or shortness of breath.  11/26/13   Historical Provider, MD   Triage Vitals: BP 126/78 mmHg  Pulse 79  Temp(Src) 97.4 F (36.3 C) (Oral)  Resp 19  Ht 6\' 2"  (1.88 m)  Wt 208 lb (94.348 kg)  BMI 26.69 kg/m2  SpO2 100%  Vital signs normal   Physical Exam  Constitutional: He is  oriented to person, place, and time. He appears well-developed and well-nourished.  Non-toxic appearance. He does not appear ill. No distress.  HENT:  Head: Normocephalic and atraumatic.  Right Ear: External ear normal.  Left Ear: External ear normal.  Nose: Nose normal. No mucosal edema or rhinorrhea.  Mouth/Throat: Mucous membranes are normal. No dental abscesses or uvula swelling.  Dry tongue  Eyes: Conjunctivae and EOM are normal. Pupils are equal, round, and reactive to light.  Neck: Normal range of motion and full passive range of motion without pain. Neck supple.  Cardiovascular: Normal rate, regular rhythm and normal heart sounds.  Exam reveals no gallop  and no friction rub.   No murmur heard. Pulmonary/Chest: Effort normal and breath sounds normal. No respiratory distress. He has no wheezes. He has no rhonchi. He has no rales. He exhibits no tenderness and no crepitus.  Abdominal: Soft. Normal appearance and bowel sounds are normal. He exhibits no distension. There is no tenderness. There is no rebound and no guarding.  Musculoskeletal: Normal range of motion. He exhibits no edema or tenderness.  Moves all extremities well.   Neurological: He is alert and oriented to person, place, and time. He has normal strength. No cranial nerve deficit.  Skin: Skin is warm, dry and intact. No rash noted. No erythema. No pallor.  Psychiatric: He has a normal mood and affect. His speech is normal and behavior is normal. His mood appears not anxious.  Nursing note and vitals reviewed.   ED Course  Procedures (including critical care time)  Medications  ketorolac (TORADOL) 30 MG/ML injection 30 mg (not administered)  HYDROmorphone (DILAUDID) injection 1 mg (1 mg Intravenous Given 11/28/14 2221)  ondansetron (ZOFRAN) injection 4 mg (4 mg Intravenous Given 11/28/14 2221)  HYDROmorphone (DILAUDID) injection 1 mg (1 mg Intravenous Given 11/28/14 2333)  ondansetron (ZOFRAN) injection 4 mg (4 mg  Intravenous Given 11/28/14 2333)  sodium chloride 0.9 % bolus 500 mL (0 mLs Intravenous Stopped 11/29/14 0000)  tamsulosin (FLOMAX) capsule 0.4 mg (0.4 mg Oral Given 11/28/14 2330)    DIAGNOSTIC STUDIES: Oxygen Saturation is 100% on RA, nl by my interpretation.    COORDINATION OF CARE: 11:21 PM-Discussed treatment plan with pt at bedside and pt agreed to plan. Plan was to control his pain so he could go home and follow-up with his urologist.  Patient improved enough that he was wanting to be discharged.  Labs Review Results for orders placed or performed during the hospital encounter of 11/28/14  CBC with Differential  Result Value Ref Range   WBC 12.8 (H) 4.0 - 10.5 K/uL   RBC 4.68 4.22 - 5.81 MIL/uL   Hemoglobin 14.1 13.0 - 17.0 g/dL   HCT 41.6 39.0 - 52.0 %   MCV 88.9 78.0 - 100.0 fL   MCH 30.1 26.0 - 34.0 pg   MCHC 33.9 30.0 - 36.0 g/dL   RDW 13.0 11.5 - 15.5 %   Platelets 208 150 - 400 K/uL   Neutrophils Relative % 72 43 - 77 %   Neutro Abs 9.2 (H) 1.7 - 7.7 K/uL   Lymphocytes Relative 19 12 - 46 %   Lymphs Abs 2.4 0.7 - 4.0 K/uL   Monocytes Relative 8 3 - 12 %   Monocytes Absolute 1.0 0.1 - 1.0 K/uL   Eosinophils Relative 1 0 - 5 %   Eosinophils Absolute 0.1 0.0 - 0.7 K/uL   Basophils Relative 0 0 - 1 %   Basophils Absolute 0.0 0.0 - 0.1 K/uL  Basic metabolic panel  Result Value Ref Range   Sodium 137 135 - 145 mmol/L   Potassium 3.8 3.5 - 5.1 mmol/L   Chloride 109 96 - 112 mmol/L   CO2 23 19 - 32 mmol/L   Glucose, Bld 109 (H) 70 - 99 mg/dL   BUN 21 6 - 23 mg/dL   Creatinine, Ser 1.21 0.50 - 1.35 mg/dL   Calcium 9.5 8.4 - 10.5 mg/dL   GFR calc non Af Amer 61 (L) >90 mL/min   GFR calc Af Amer 70 (L) >90 mL/min   Anion gap 5 5 - 15  Urinalysis, Routine w  reflex microscopic  Result Value Ref Range   Color, Urine YELLOW YELLOW   APPearance CLEAR CLEAR   Specific Gravity, Urine >1.030 (H) 1.005 - 1.030   pH 5.5 5.0 - 8.0   Glucose, UA NEGATIVE NEGATIVE mg/dL   Hgb  urine dipstick SMALL (A) NEGATIVE   Bilirubin Urine NEGATIVE NEGATIVE   Ketones, ur NEGATIVE NEGATIVE mg/dL   Protein, ur NEGATIVE NEGATIVE mg/dL   Urobilinogen, UA 0.2 0.0 - 1.0 mg/dL   Nitrite NEGATIVE NEGATIVE   Leukocytes, UA NEGATIVE NEGATIVE  Urine microscopic-add on  Result Value Ref Range   Squamous Epithelial / LPF RARE RARE   WBC, UA 0-2 <3 WBC/hpf   RBC / HPF 11-20 <3 RBC/hpf   Bacteria, UA FEW (A) RARE   Laboratory interpretation all normal except hematuria, stable creatinine at 1.2 since last year, although it was lower earlier in the year, leukocytosis     Imaging Review Ct Renal Stone Study  11/28/2014   CLINICAL DATA:  RIGHT flank pain. Onset of symptoms this morning. Initial encounter. Previous renal stones.  EXAM: CT ABDOMEN AND PELVIS WITHOUT CONTRAST  TECHNIQUE: Multidetector CT imaging of the abdomen and pelvis was performed following the standard protocol without IV contrast.  COMPARISON:  05/06/2014.  FINDINGS: Musculoskeletal: L5-S1 predominant lumbar spondylosis. No aggressive osseous lesions. Bilateral femoral head AVN is present. No unstable fragments are identified at the hips. Protuberant xiphoid incidentally noted.  Lung Bases: Atelectasis with unchanged 6 mm subpleural nodule in the LEFT lower lobe. This is unchanged compared 10/11/2013. Another followup in 12 months will confirm benign etiology. Areas of mild nodular atelectasis are present in the RIGHT lower lobe.  Liver: Unenhanced CT was performed per clinician order. Lack of IV contrast limits sensitivity and specificity, especially for evaluation of abdominal/pelvic solid viscera. Normal.  Spleen:  Normal.  Gallbladder:  Normal.  Common bile duct:  Normal.  Pancreas:  Normal.  Adrenal glands:  Normal bilaterally.  Kidneys: Punctate nonobstructing LEFT renal collecting system calculi without LEFT hydronephrosis. The LEFT ureter is normal.  There is mild RIGHT hydroureteronephrosis. Punctate nonobstructing renal  collecting system calculi remain. Small RIGHT upper pole renal cyst. Mild RIGHT hydroureter extending into the anatomic pelvis. 2 mm calculus is present at the RIGHT UVJ.  Stomach:  Grossly normal.  Small bowel:  Grossly normal.  Colon: Ascending colectomy with ileocolonic anastomosis in the RIGHT upper quadrant. No complicating features. Fluid is present in the colon, expected following ileocolonic anastomosis.  Pelvic Genitourinary:  No bladder stones are present.  Peritoneum: No free fluid or free air.  Vasculature: Atherosclerosis without a gross acute abnormality allowing for noncontrast technique.  Body Wall: Inguinal herniorrhaphy. Scarring around the umbilicus compatible with prior operations.  IMPRESSION: 1. 2 mm RIGHT UVJ stone with mild RIGHT hydroureteronephrosis. 2. Nonobstructing bilateral residual renal collecting system calculi. 3. Ascending colectomy with ileocolonic anastomosis. 4. Just over 1 year of stability of the 6 mm LEFT lower lobe pulmonary nodule. Another 12 month CT recommended to confirm benign etiology.   Electronically Signed   By: Dereck Ligas M.D.   On: 11/28/2014 22:53     EKG Interpretation None      MDM   Final diagnoses:  Right ureteral stone    New Prescriptions   ONDANSETRON (ZOFRAN) 4 MG TABLET    Take 1 tablet (4 mg total) by mouth every 8 (eight) hours as needed.   OXYCODONE-ACETAMINOPHEN (PERCOCET/ROXICET) 5-325 MG PER TABLET    Take 1-2 tablets by mouth every  6 (six) hours as needed for severe pain.   OXYCODONE-ACETAMINOPHEN (PERCOCET/ROXICET) 5-325 MG PER TABLET    Take 1-2 tablets by mouth every 6 (six) hours as needed for moderate pain or severe pain.   TAMSULOSIN (FLOMAX) 0.4 MG CAPS CAPSULE    Take 1 po QD until you pass the stone.    Plan discharge  Rolland Porter, MD, FACEP   I personally performed the services described in this documentation, which was scribed in my presence. The recorded information has been reviewed and considered.  Rolland Porter, MD, FACEP    Eduardo Norrie, MD 11/29/14 [phone]

## 2014-11-29 DIAGNOSIS — N201 Calculus of ureter: Secondary | ICD-10-CM | POA: Diagnosis not present

## 2014-11-29 MED ORDER — OXYCODONE-ACETAMINOPHEN 5-325 MG PO TABS: 1.0000 | ORAL_TABLET | Freq: Four times a day (QID) | ORAL | Status: DC | PRN

## 2014-11-29 MED ORDER — ONDANSETRON HCL 4 MG PO TABS: 4.0000 mg | ORAL_TABLET | Freq: Three times a day (TID) | ORAL | Status: DC | PRN

## 2014-11-29 MED ORDER — KETOROLAC TROMETHAMINE 30 MG/ML IJ SOLN
30.0000 mg | Freq: Once | INTRAMUSCULAR | Status: AC
  Administered 2014-11-29: 30 mg via INTRAVENOUS
  Filled 2014-11-29: qty 1

## 2014-11-29 MED ORDER — TAMSULOSIN HCL 0.4 MG PO CAPS: ORAL_CAPSULE | ORAL | Status: DC

## 2014-11-29 NOTE — Discharge Instructions (Signed)
Drink plenty of fluids. Take the medications as prescribed. If you should get a fever, have uncontrolled vomiting or pain, go to [facility] where the [facility] do all their procedures. Call [facility]'s office in the morning to get an appointment to follow up on your kidney stone.     Ureteral Colic (Kidney Stones) Ureteral colic is the result of a condition when kidney stones form inside the kidney. Once kidney stones are formed they may move into the tube that connects the kidney with the bladder (ureter). If this occurs, this condition may cause pain (colic) in the ureter.  CAUSES  Pain is caused by stone movement in the ureter and the obstruction caused by the stone. SYMPTOMS  The pain comes and goes as the ureter contracts around the stone. The pain is usually intense, sharp, and stabbing in character. The location of the pain may move as the stone moves through the ureter. When the stone is near the kidney the pain is usually located in the back and radiates to the belly (abdomen). When the stone is ready to pass into the bladder the pain is often located in the lower abdomen on the side the stone is located. At this location, the symptoms may mimic those of a urinary tract infection with urinary frequency. Once the stone is located here it often passes into the bladder and the pain disappears completely. TREATMENT   Your caregiver will provide you with medicine for pain relief.  You may require specialized follow-up X-rays.  The absence of pain does not always mean that the stone has passed. It may have just stopped moving. If the urine remains completely obstructed, it can cause loss of kidney function or even complete destruction of the involved kidney. It is your responsibility and in your interest that X-rays and follow-ups as suggested by your caregiver are completed. Relief of pain without passage of the stone can be associated with severe damage to the kidney,  including loss of kidney function on that side.  If your stone does not pass on its own, additional measures may be taken by your caregiver to ensure its removal. HOME CARE INSTRUCTIONS   Increase your fluid intake. Water is the preferred fluid since juices containing vitamin C may acidify the urine making it less likely for certain stones (uric acid stones) to pass.  Strain all urine. A strainer will be provided. Keep all particulate matter or stones for your caregiver to inspect.  Take your pain medicine as directed.  Make a follow-up appointment with your caregiver as directed.  Remember that the goal is passage of your stone. The absence of pain does not mean the stone is gone. Follow your caregiver's instructions.  Only take over-the-counter or prescription medicines for pain, discomfort, or fever as directed by your caregiver. SEEK MEDICAL CARE IF:   Pain cannot be controlled with the prescribed medicine.  You have a fever.  Pain continues for longer than your caregiver advises it should.  There is a change in the pain, and you develop chest discomfort or constant abdominal pain.  You feel faint or pass out. MAKE SURE YOU:   Understand these instructions.  Will watch your condition.  Will get help right away if you are not doing well or get worse. Document Released: 07/17/2005 Document Revised: 02/01/2013 Document Reviewed: 04/03/2011 [facility] Patient Information 2015 [place], [place]. This information is not intended to replace advice given to you by your health care provider. Make sure you  discuss any questions you have with your health care provider. ° °

## 2014-11-29 NOTE — ED Notes (Signed)
Pt alert & oriented x4, stable gait. Patient given discharge instructions, paperwork & prescription(s). Patient informed not to drive, operate any equipment & handel any important documents 4 hours after taking pain medication. Patient  instructed to stop at the registration desk to finish any additional paperwork. Patient  verbalized understanding. Pt left department w/ no further questions. 

## 2014-12-05 MED FILL — Oxycodone w/ Acetaminophen Tab 5-325 MG: ORAL | Qty: 6 | Status: AC

## 2014-12-30 ENCOUNTER — Ambulatory Visit (INDEPENDENT_AMBULATORY_CARE_PROVIDER_SITE_OTHER): Payer: Managed Care, Other (non HMO) | Admitting: Urology

## 2014-12-30 DIAGNOSIS — N201 Calculus of ureter: Secondary | ICD-10-CM | POA: Diagnosis not present

## 2014-12-30 DIAGNOSIS — N200 Calculus of kidney: Secondary | ICD-10-CM

## 2015-01-06 ENCOUNTER — Ambulatory Visit (INDEPENDENT_AMBULATORY_CARE_PROVIDER_SITE_OTHER): Payer: Managed Care, Other (non HMO) | Admitting: Urology

## 2015-01-06 DIAGNOSIS — N200 Calculus of kidney: Secondary | ICD-10-CM | POA: Diagnosis not present

## 2015-04-17 ENCOUNTER — Other Ambulatory Visit: Payer: Self-pay

## 2015-04-28 ENCOUNTER — Ambulatory Visit: Payer: Self-pay | Admitting: Surgery

## 2015-04-28 NOTE — H&P (Signed)
Hilery Wintle 04/28/2015 11:03 AM Location: [facility] Patient #: [id number] DOB: 1946-02-06 Married / Language: English / Race: White Male History of Present Illness Marcello Moores A. Larin Weissberg MD; 04/28/2015 11:45 AM) Patient words: f/u hernia   Pt returns in follow up of incisional hernia. In 2015 pt underwent right hemicolectomy for colon cancer by Dr Arnoldo Morale at [facility] and developed a fistula and was transfered to [facility] for post op management. He underwnt medical management and this failed so he underwent operative closure 3 months later.  He has developed a bulge at his incision and this is causing pain. No nausea or vomiting and his bowels are moving. Colonoscopy in Dec 2015 shows no cancer recurrence.  The patient is a 69 year old male   Allergies Marjean Donna, CMA; 04/28/2015 11:04 AM) Niaspan *ANTIHYPERLIPIDEMICS* Tape 1"X5yd *MEDICAL DEVICES*  Medication History (Sonya Bynum, CMA; 04/28/2015 11:04 AM) Flonase (50MCG/ACT Suspension, Nasal) Active. Vicodin (5-300MG  Tablet, Oral) Active. Anusol-HC (25MG  Suppository, Rectal) Active. Reglan (10MG  Tablet, Oral) Active. Naproxen Sodium (220MG  Capsule, Oral) Active. Roxicet (5-325MG  Tablet, Oral) Active. Maxalt (10MG  Tablet, Oral) Active. Ventolin HFA (108 (90 Base)MCG/ACT Aerosol Soln, Inhalation) Active. Medications Reconciled    Vitals (Sonya Bynum CMA; 04/28/2015 11:04 AM) 04/28/2015 11:03 AM Weight: 218 lb Height: 74in Body Surface Area: 2.27 m Body Mass Index: 27.99 kg/m Temp.: 97.34F(Temporal)  Pulse: 71 (Regular)  BP: 128/78 (Sitting, Left Arm, Standard)     Physical Exam (Rakel Junio A. Abron Neddo MD; 04/28/2015 11:46 AM)  General Mental Status-Alert. General Appearance-Consistent with stated age. Hydration-Well hydrated. Voice-Normal.  Head and Neck Head-normocephalic, atraumatic with no lesions or palpable masses. Trachea-midline. Thyroid Gland Characteristics - normal size and  consistency.  Chest and Lung Exam Chest and lung exam reveals -quiet, even and easy respiratory effort with no use of accessory muscles and on auscultation, normal breath sounds, no adventitious sounds and normal vocal resonance. Inspection Chest Wall - Normal. Back - normal.  Cardiovascular Cardiovascular examination reveals -normal heart sounds, regular rate and rhythm with no murmurs and normal pedal pulses bilaterally.  Abdomen Note: 4 cm incisional hernia redicible mild tenderness to palpation.    Neurologic Neurologic evaluation reveals -alert and oriented x 3 with no impairment of recent or remote memory. Mental Status-Normal.  Musculoskeletal Normal Exam - Left-Upper Extremity Strength Normal and Lower Extremity Strength Normal. Normal Exam - Right-Upper Extremity Strength Normal and Lower Extremity Strength Normal.    Assessment & Plan (Mikell Kazlauskas A. Valeta Paz MD; 04/28/2015 11:41 AM)  [name] HERNIA (553.21  K43.2)  Current Plans Pt Education - [facility] Abdominal Surgery (Gross) The anatomy and the physiology was discussed. The pathophysiology and natural history of the disease was discussed. Options were discussed and recommendations were made. Technique, risks, benefits, & alternatives were discussed. Risks such as stroke, heart attack, bleeding, indection, death, and other risks discussed. Questions answered. The patient agrees to proceed. The anatomy & physiology of the digestive tract was discussed. The pathophysiology of intestinal obstruction was discussed. Natural history risks without surgery was discussed. Differential diagnosis discuseed. I feel the patient has failed non-operative therapies. The risks of no intervention will lead to serious problems such as necrosis, perforation, dehydration, etc. that outweigh the operative risks; therefore, I recommended abdominal exploration to diagnose & treat the source of the problem. Minimally Invasive & open  techniques were discussed. I expressed a good likelihood that surgery will treat the problem.  Risks such as bleeding, infection, abscess, leak, reoperation, bowel resection, possible ostomy, hernia, heart attack, death, and other  risks were discussed. I noted a good likelihood this will help address the problem. Goals of post-operative recovery were discussed as well. We will work to minimize complications. Questions were answered. The patient expresses understanding & wishes to proceed with surgery. The anatomy & physiology of the abdominal wall was discussed. The pathophysiology of hernias was discussed. Natural history risks without surgery including progeressive enlargement, pain, incarceration, & strangulation was discussed. Contributors to complications such as smoking, obesity, diabetes, prior surgery, etc were discussed.  I feel the risks of no intervention will lead to serious problems that outweigh the operative risks; therefore, I recommended surgery to reduce and repair the hernia. I explained laparoscopic techniques with possible need for an open approach. I noted the probable use of mesh to patch and/or buttress the hernia repair  Risks such as bleeding, infection, abscess, need for further treatment, heart attack, death, and other risks were discussed. I noted a good likelihood this will help address the problem. Goals of post-operative recovery were discussed as well. Possibility that this will not correct all symptoms was explained. I stressed the importance of low-impact activity, aggressive pain control, avoiding constipation, & not pushing through pain to minimize risk of post-operative chronic pain or injury. Possibility of reherniation especially with smoking, obesity, diabetes, immunosuppression, and other health conditions was discussed. We will work to minimize complications.  An educational handout further explaining the pathology & treatment options was given as well. Questions  were answered. The patient expresses understanding & wishes to proceed with surgery. Pt Education - Hernia: discussed with patient and provided information. Pt Education - Incisional hernia: discussed with patient and provided information. Pt Education - Overview of abdominal wall hernias in adults: discussed with patient and provided information.

## 2015-05-24 ENCOUNTER — Encounter (INDEPENDENT_AMBULATORY_CARE_PROVIDER_SITE_OTHER): Payer: Self-pay | Admitting: *Deleted

## 2015-06-13 ENCOUNTER — Encounter (HOSPITAL_COMMUNITY): Payer: Self-pay

## 2015-06-13 ENCOUNTER — Encounter (HOSPITAL_COMMUNITY)
Admission: RE | Admit: 2015-06-13 | Discharge: 2015-06-13 | Disposition: A | Discharge status: Home / Self Care | Payer: Managed Care, Other (non HMO) | Source: Ambulatory Visit | Attending: Surgery | Admitting: Surgery

## 2015-06-13 DIAGNOSIS — K432 Incisional hernia without obstruction or gangrene: Secondary | ICD-10-CM | POA: Diagnosis not present

## 2015-06-13 DIAGNOSIS — Z01812 Encounter for preprocedural laboratory examination: Secondary | ICD-10-CM | POA: Insufficient documentation

## 2015-06-13 LAB — CBC WITH DIFFERENTIAL/PLATELET
Basophils Absolute: 0 10*3/uL (ref 0.0–0.1)
Basophils Relative: 0 % (ref 0–1)
EOS PCT: 3 % (ref 0–5)
Eosinophils Absolute: 0.2 10*3/uL (ref 0.0–0.7)
HEMATOCRIT: 38.3 % — AB (ref 39.0–52.0)
Hemoglobin: 13.2 g/dL (ref 13.0–17.0)
LYMPHS PCT: 28 % (ref 12–46)
Lymphs Abs: 1.6 10*3/uL (ref 0.7–4.0)
MCH: 30.6 pg (ref 26.0–34.0)
MCHC: 34.5 g/dL (ref 30.0–36.0)
MCV: 88.7 fL (ref 78.0–100.0)
MONO ABS: 0.5 10*3/uL (ref 0.1–1.0)
MONOS PCT: 8 % (ref 3–12)
NEUTROS ABS: 3.5 10*3/uL (ref 1.7–7.7)
Neutrophils Relative %: 61 % (ref 43–77)
Platelets: 184 10*3/uL (ref 150–400)
RBC: 4.32 MIL/uL (ref 4.22–5.81)
RDW: 13.3 % (ref 11.5–15.5)
WBC: 5.7 10*3/uL (ref 4.0–10.5)

## 2015-06-13 LAB — COMPREHENSIVE METABOLIC PANEL
ALT: 25 U/L (ref 17–63)
ANION GAP: 10 (ref 5–15)
AST: 30 U/L (ref 15–41)
Albumin: 3.6 g/dL (ref 3.5–5.0)
Alkaline Phosphatase: 53 U/L (ref 38–126)
BILIRUBIN TOTAL: 0.5 mg/dL (ref 0.3–1.2)
BUN: 11 mg/dL (ref 6–20)
CO2: 24 mmol/L (ref 22–32)
Calcium: 9.1 mg/dL (ref 8.9–10.3)
Chloride: 106 mmol/L (ref 101–111)
Creatinine, Ser: 0.94 mg/dL (ref 0.61–1.24)
GFR calc Af Amer: >60 mL/min (ref >60)
Glucose, Bld: 92 mg/dL (ref 65–99)
POTASSIUM: 3.7 mmol/L (ref 3.5–5.1)
Sodium: 140 mmol/L (ref 135–145)
TOTAL PROTEIN: 6.5 g/dL (ref 6.5–8.1)

## 2015-06-13 NOTE — Pre-Procedure Instructions (Signed)
    Gopal Malter Baba  06/13/2015      [facility] [place] [facility] [place] [address] [place] [id number] Phone: [phone] Fax: [phone]    Your procedure is scheduled on 06-20-2015   Tuesday    Report to [facility] Admitting at 5:30 A.M.    Call this number if you have problems the morning of surgery:  [phone]   Remember:  Do not eat food or drink liquids after midnight.   Take these medicines the morning of surgery with A SIP OF WATER pain medication if needed,omeprazole(Prilosec),Flonase nasal spray if needed,   Do not wear jewelry          Do not wear lotions, powders, or perfumes.    Do not shave 48 hours prior to surgery.  Men may shave face and neck .  Do not bring valuables to the hospital.  [facility] is not responsible for any belongings or valuables.  Contacts, dentures or bridgework may not be worn into surgery.  Leave your suitcase in the car.  After surgery it may be brought to your room.  For patients admitted to the hospital, discharge time will be determined by your treatment team.  Patients discharged the day of surgery will not be allowed to drive home.    Special instructions:  See attached sheet "Preparing for Surgery" for instructions on CHG shower  Please read over the following fact sheets that you were given. Pain Booklet, Coughing and Deep Breathing and Surgical Site Infection Prevention

## 2015-06-14 DIAGNOSIS — K439 Ventral hernia without obstruction or gangrene: Secondary | ICD-10-CM | POA: Insufficient documentation

## 2015-06-19 MED ORDER — CEFAZOLIN SODIUM 10 G IJ SOLR
3.0000 g | INTRAMUSCULAR | Status: AC
  Administered 2015-06-20: 3 g via INTRAVENOUS
  Filled 2015-06-19: qty 3000

## 2015-06-19 MED ORDER — CHLORHEXIDINE GLUCONATE 4 % EX LIQD: 1.0000 "application " | Freq: Once | CUTANEOUS | Status: DC

## 2015-06-20 ENCOUNTER — Inpatient Hospital Stay (HOSPITAL_COMMUNITY)
Admission: RE | Admit: 2015-06-20 | Discharge: 2015-06-24 | DRG: 355 | Disposition: A | Discharge status: Home / Self Care | Payer: Managed Care, Other (non HMO) | Source: Ambulatory Visit | Attending: Surgery | Admitting: Surgery

## 2015-06-20 ENCOUNTER — Encounter (HOSPITAL_COMMUNITY)
Admission: RE | Disposition: A | Discharge status: Home / Self Care | Payer: Self-pay | Source: Ambulatory Visit | Attending: Surgery

## 2015-06-20 ENCOUNTER — Ambulatory Visit (HOSPITAL_COMMUNITY): Payer: Managed Care, Other (non HMO) | Admitting: Anesthesiology

## 2015-06-20 ENCOUNTER — Encounter (HOSPITAL_COMMUNITY): Payer: Self-pay | Admitting: *Deleted

## 2015-06-20 DIAGNOSIS — R339 Retention of urine, unspecified: Secondary | ICD-10-CM | POA: Diagnosis not present

## 2015-06-20 DIAGNOSIS — K219 Gastro-esophageal reflux disease without esophagitis: Secondary | ICD-10-CM | POA: Diagnosis present

## 2015-06-20 DIAGNOSIS — Z85038 Personal history of other malignant neoplasm of large intestine: Secondary | ICD-10-CM | POA: Diagnosis not present

## 2015-06-20 DIAGNOSIS — K432 Incisional hernia without obstruction or gangrene: Secondary | ICD-10-CM | POA: Diagnosis present

## 2015-06-20 DIAGNOSIS — M1990 Unspecified osteoarthritis, unspecified site: Secondary | ICD-10-CM | POA: Diagnosis present

## 2015-06-20 DIAGNOSIS — K469 Unspecified abdominal hernia without obstruction or gangrene: Secondary | ICD-10-CM | POA: Diagnosis present

## 2015-06-20 HISTORY — PX: INCISIONAL HERNIA REPAIR: SHX193

## 2015-06-20 HISTORY — DX: Unspecified malignant neoplasm of skin of unspecified part of face: C44.300

## 2015-06-20 HISTORY — PX: INSERTION OF MESH: SHX5868

## 2015-06-20 HISTORY — DX: Migraine, unspecified, not intractable, without status migrainosus: G43.909

## 2015-06-20 LAB — CREATININE, SERUM
Creatinine, Ser: 0.91 mg/dL (ref 0.61–1.24)
GFR calc Af Amer: >60 mL/min (ref >60)
GFR calc non Af Amer: >60 mL/min (ref >60)

## 2015-06-20 LAB — CBC
HCT: 34.5 % — ABNORMAL LOW (ref 39.0–52.0)
Hemoglobin: 11.9 g/dL — ABNORMAL LOW (ref 13.0–17.0)
MCH: 31 pg (ref 26.0–34.0)
MCHC: 34.5 g/dL (ref 30.0–36.0)
MCV: 89.8 fL (ref 78.0–100.0)
PLATELETS: 172 10*3/uL (ref 150–400)
RBC: 3.84 MIL/uL — ABNORMAL LOW (ref 4.22–5.81)
RDW: 13.5 % (ref 11.5–15.5)
WBC: 10 10*3/uL (ref 4.0–10.5)

## 2015-06-20 SURGERY — REPAIR, HERNIA, INCISIONAL
Anesthesia: General | Site: Abdomen

## 2015-06-20 MED ORDER — DOCUSATE SODIUM 100 MG PO CAPS
100.0000 mg | ORAL_CAPSULE | Freq: Two times a day (BID) | ORAL | Status: DC
  Administered 2015-06-20 – 2015-06-24 (×8): 100 mg via ORAL
  Filled 2015-06-20 (×8): qty 1

## 2015-06-20 MED ORDER — ARTIFICIAL TEARS OP OINT
TOPICAL_OINTMENT | OPHTHALMIC | Status: DC | PRN
  Administered 2015-06-20: 1 via OPHTHALMIC

## 2015-06-20 MED ORDER — NEOSTIGMINE METHYLSULFATE 10 MG/10ML IV SOLN
INTRAVENOUS | Status: DC | PRN
  Administered 2015-06-20: 4 mg via INTRAVENOUS

## 2015-06-20 MED ORDER — SODIUM CHLORIDE 0.9 % IJ SOLN: 9.0000 mL | INTRAMUSCULAR | Status: DC | PRN

## 2015-06-20 MED ORDER — ACETAMINOPHEN 10 MG/ML IV SOLN
INTRAVENOUS | Status: AC
  Filled 2015-06-20: qty 100

## 2015-06-20 MED ORDER — EVICEL 5 ML EX KIT
PACK | CUTANEOUS | Status: AC
  Filled 2015-06-20: qty 2

## 2015-06-20 MED ORDER — KCL IN DEXTROSE-NACL 20-5-0.9 MEQ/L-%-% IV SOLN
INTRAVENOUS | Status: DC
  Administered 2015-06-20 – 2015-06-22 (×5): via INTRAVENOUS
  Administered 2015-06-23: 50 mL/h via INTRAVENOUS
  Administered 2015-06-23: 03:00:00 via INTRAVENOUS
  Filled 2015-06-20 (×9): qty 1000

## 2015-06-20 MED ORDER — FENTANYL CITRATE (PF) 250 MCG/5ML IJ SOLN
INTRAMUSCULAR | Status: AC
  Filled 2015-06-20: qty 5

## 2015-06-20 MED ORDER — GLYCOPYRROLATE 0.2 MG/ML IJ SOLN
INTRAMUSCULAR | Status: DC | PRN
  Administered 2015-06-20: 0.6 mg via INTRAVENOUS

## 2015-06-20 MED ORDER — LIDOCAINE HCL (CARDIAC) 20 MG/ML IV SOLN
INTRAVENOUS | Status: DC | PRN
  Administered 2015-06-20: 80 mg via INTRAVENOUS

## 2015-06-20 MED ORDER — CEFAZOLIN SODIUM-DEXTROSE 2-3 GM-% IV SOLR
2.0000 g | Freq: Three times a day (TID) | INTRAVENOUS | Status: AC
  Administered 2015-06-20: 2 g via INTRAVENOUS
  Filled 2015-06-20: qty 50

## 2015-06-20 MED ORDER — PROPOFOL 10 MG/ML IV BOLUS
INTRAVENOUS | Status: DC | PRN
  Administered 2015-06-20: 160 mg via INTRAVENOUS

## 2015-06-20 MED ORDER — HYDROMORPHONE 0.3 MG/ML IV SOLN
INTRAVENOUS | Status: DC
  Administered 2015-06-20: 11:00:00 via INTRAVENOUS
  Administered 2015-06-20: 1.5 mg via INTRAVENOUS
  Administered 2015-06-20: 3.59 mg via INTRAVENOUS
  Administered 2015-06-20: 18:00:00 via INTRAVENOUS
  Administered 2015-06-21: 0.9 mg via INTRAVENOUS
  Administered 2015-06-21: 1.5 mg via INTRAVENOUS
  Administered 2015-06-21: 3.65 mg via INTRAVENOUS
  Administered 2015-06-21: 2.4 mg via INTRAVENOUS
  Administered 2015-06-21: 1.2 mg via INTRAVENOUS
  Administered 2015-06-21: 1.5 mg via INTRAVENOUS
  Administered 2015-06-21: 1.2 mg via INTRAVENOUS
  Administered 2015-06-22: 2.1 mg via INTRAVENOUS
  Administered 2015-06-22: 0.6 mg via INTRAVENOUS
  Administered 2015-06-22: 1.2 mg via INTRAVENOUS
  Administered 2015-06-22: 2.4 mg via INTRAVENOUS
  Administered 2015-06-22: 0.9 mg via INTRAVENOUS
  Administered 2015-06-22: 02:00:00 via INTRAVENOUS
  Administered 2015-06-23: 0.3 mg via INTRAVENOUS
  Administered 2015-06-23: 0 mg via INTRAVENOUS
  Administered 2015-06-23 (×2): 0.3 mg via INTRAVENOUS
  Filled 2015-06-20 (×3): qty 25

## 2015-06-20 MED ORDER — SIMETHICONE 80 MG PO CHEW: 40.0000 mg | CHEWABLE_TABLET | Freq: Four times a day (QID) | ORAL | Status: DC | PRN

## 2015-06-20 MED ORDER — HYDROMORPHONE HCL 1 MG/ML IJ SOLN
INTRAMUSCULAR | Status: AC
  Administered 2015-06-20: 0.5 mg via INTRAVENOUS
  Filled 2015-06-20: qty 1

## 2015-06-20 MED ORDER — MIDAZOLAM HCL 2 MG/2ML IJ SOLN
INTRAMUSCULAR | Status: AC
  Filled 2015-06-20: qty 4

## 2015-06-20 MED ORDER — OXYCODONE HCL 5 MG PO TABS
5.0000 mg | ORAL_TABLET | ORAL | Status: DC | PRN
  Administered 2015-06-23 (×2): 10 mg via ORAL
  Filled 2015-06-20 (×2): qty 2

## 2015-06-20 MED ORDER — PROMETHAZINE HCL 25 MG/ML IJ SOLN: 6.2500 mg | INTRAMUSCULAR | Status: DC | PRN

## 2015-06-20 MED ORDER — LACTATED RINGERS IV SOLN: INTRAVENOUS | Status: DC

## 2015-06-20 MED ORDER — DIPHENHYDRAMINE HCL 50 MG/ML IJ SOLN: 12.5000 mg | Freq: Four times a day (QID) | INTRAMUSCULAR | Status: DC | PRN

## 2015-06-20 MED ORDER — ENOXAPARIN SODIUM 40 MG/0.4ML ~~LOC~~ SOLN
40.0000 mg | SUBCUTANEOUS | Status: DC
  Administered 2015-06-21 – 2015-06-24 (×4): 40 mg via SUBCUTANEOUS
  Filled 2015-06-20 (×4): qty 0.4

## 2015-06-20 MED ORDER — HYDROMORPHONE HCL 1 MG/ML IJ SOLN
0.2500 mg | INTRAMUSCULAR | Status: DC | PRN
  Administered 2015-06-20 (×4): 0.5 mg via INTRAVENOUS

## 2015-06-20 MED ORDER — SUMATRIPTAN SUCCINATE 50 MG PO TABS
50.0000 mg | ORAL_TABLET | ORAL | Status: DC | PRN
  Filled 2015-06-20: qty 1

## 2015-06-20 MED ORDER — ZOLPIDEM TARTRATE 5 MG PO TABS: 5.0000 mg | ORAL_TABLET | Freq: Every evening | ORAL | Status: DC | PRN

## 2015-06-20 MED ORDER — KETOROLAC TROMETHAMINE 30 MG/ML IJ SOLN
30.0000 mg | Freq: Once | INTRAMUSCULAR | Status: AC
  Administered 2015-06-20: 30 mg via INTRAVENOUS

## 2015-06-20 MED ORDER — ALBUTEROL SULFATE (2.5 MG/3ML) 0.083% IN NEBU: 3.0000 mL | INHALATION_SOLUTION | Freq: Four times a day (QID) | RESPIRATORY_TRACT | Status: DC | PRN

## 2015-06-20 MED ORDER — ONDANSETRON HCL 4 MG/2ML IJ SOLN
4.0000 mg | Freq: Four times a day (QID) | INTRAMUSCULAR | Status: DC | PRN
  Administered 2015-06-21 – 2015-06-23 (×2): 4 mg via INTRAVENOUS
  Filled 2015-06-20 (×2): qty 2

## 2015-06-20 MED ORDER — NALOXONE HCL 0.4 MG/ML IJ SOLN: 0.4000 mg | INTRAMUSCULAR | Status: DC | PRN

## 2015-06-20 MED ORDER — ROCURONIUM BROMIDE 100 MG/10ML IV SOLN
INTRAVENOUS | Status: DC | PRN
  Administered 2015-06-20: 40 mg via INTRAVENOUS

## 2015-06-20 MED ORDER — KETOROLAC TROMETHAMINE 30 MG/ML IJ SOLN
INTRAMUSCULAR | Status: AC
  Administered 2015-06-20: 30 mg via INTRAVENOUS
  Filled 2015-06-20: qty 1

## 2015-06-20 MED ORDER — 0.9 % SODIUM CHLORIDE (POUR BTL) OPTIME
TOPICAL | Status: DC | PRN
  Administered 2015-06-20: 1000 mL

## 2015-06-20 MED ORDER — DIPHENHYDRAMINE HCL 12.5 MG/5ML PO ELIX: 12.5000 mg | ORAL_SOLUTION | Freq: Four times a day (QID) | ORAL | Status: DC | PRN

## 2015-06-20 MED ORDER — EVICEL 5 ML EX KIT
PACK | CUTANEOUS | Status: AC
  Filled 2015-06-20: qty 1

## 2015-06-20 MED ORDER — HYDRALAZINE HCL 20 MG/ML IJ SOLN: 10.0000 mg | INTRAMUSCULAR | Status: DC | PRN

## 2015-06-20 MED ORDER — LACTATED RINGERS IV SOLN
INTRAVENOUS | Status: DC | PRN
  Administered 2015-06-20 (×2): via INTRAVENOUS

## 2015-06-20 MED ORDER — TAMSULOSIN HCL 0.4 MG PO CAPS: 0.4000 mg | ORAL_CAPSULE | Freq: Every day | ORAL | Status: DC | PRN

## 2015-06-20 MED ORDER — METHOCARBAMOL 500 MG PO TABS
500.0000 mg | ORAL_TABLET | Freq: Four times a day (QID) | ORAL | Status: DC | PRN
  Administered 2015-06-21 – 2015-06-24 (×2): 500 mg via ORAL
  Filled 2015-06-20 (×2): qty 1

## 2015-06-20 MED ORDER — ACETAMINOPHEN 325 MG PO TABS
650.0000 mg | ORAL_TABLET | Freq: Four times a day (QID) | ORAL | Status: DC | PRN
  Administered 2015-06-21 – 2015-06-22 (×3): 650 mg via ORAL
  Filled 2015-06-20 (×3): qty 2

## 2015-06-20 MED ORDER — HYDROMORPHONE 0.3 MG/ML IV SOLN
INTRAVENOUS | Status: AC
  Filled 2015-06-20: qty 25

## 2015-06-20 MED ORDER — FENTANYL CITRATE (PF) 100 MCG/2ML IJ SOLN
INTRAMUSCULAR | Status: DC | PRN
  Administered 2015-06-20 (×7): 50 ug via INTRAVENOUS

## 2015-06-20 MED ORDER — ONDANSETRON HCL 4 MG/2ML IJ SOLN
INTRAMUSCULAR | Status: DC | PRN
  Administered 2015-06-20: 4 mg via INTRAVENOUS

## 2015-06-20 MED ORDER — ONDANSETRON 4 MG PO TBDP: 4.0000 mg | ORAL_TABLET | Freq: Four times a day (QID) | ORAL | Status: DC | PRN

## 2015-06-20 MED ORDER — FLUTICASONE PROPIONATE 50 MCG/ACT NA SUSP
2.0000 | Freq: Every day | NASAL | Status: DC | PRN
  Filled 2015-06-20: qty 16

## 2015-06-20 MED ORDER — MEPERIDINE HCL 25 MG/ML IJ SOLN: 6.2500 mg | INTRAMUSCULAR | Status: DC | PRN

## 2015-06-20 MED ORDER — ACETAMINOPHEN 650 MG RE SUPP: 650.0000 mg | Freq: Four times a day (QID) | RECTAL | Status: DC | PRN

## 2015-06-20 MED ORDER — MIDAZOLAM HCL 5 MG/5ML IJ SOLN
INTRAMUSCULAR | Status: DC | PRN
  Administered 2015-06-20: 2 mg via INTRAVENOUS

## 2015-06-20 MED ORDER — ACETAMINOPHEN 10 MG/ML IV SOLN
1000.0000 mg | Freq: Once | INTRAVENOUS | Status: AC
  Administered 2015-06-20: 1000 mg via INTRAVENOUS

## 2015-06-20 MED ORDER — VECURONIUM BROMIDE 10 MG IV SOLR
INTRAVENOUS | Status: DC | PRN
  Administered 2015-06-20: 2 mg via INTRAVENOUS
  Administered 2015-06-20: 1 mg via INTRAVENOUS

## 2015-06-20 MED ORDER — PROPOFOL 10 MG/ML IV BOLUS
INTRAVENOUS | Status: AC
  Filled 2015-06-20: qty 20

## 2015-06-20 MED ORDER — EVICEL 5 ML EX KIT
PACK | CUTANEOUS | Status: DC | PRN
  Administered 2015-06-20 (×3): 5 mL

## 2015-06-20 MED ORDER — ONDANSETRON HCL 4 MG/2ML IJ SOLN
4.0000 mg | Freq: Four times a day (QID) | INTRAMUSCULAR | Status: DC | PRN
  Administered 2015-06-20: 4 mg via INTRAVENOUS
  Filled 2015-06-20: qty 2

## 2015-06-20 SURGICAL SUPPLY — 51 items
BINDER ABD UNIV 10 28-50 (GAUZE/BANDAGES/DRESSINGS) IMPLANT
BINDER ABDOM UNIV 10 (GAUZE/BANDAGES/DRESSINGS) ×3
BLADE SURG 11 STRL SS (BLADE) ×2 IMPLANT
CANISTER SUCTION 2500CC (MISCELLANEOUS) ×3 IMPLANT
CHLORAPREP W/TINT 26ML (MISCELLANEOUS) ×3 IMPLANT
COVER SURGICAL LIGHT HANDLE (MISCELLANEOUS) ×3 IMPLANT
DEVICE TROCAR PUNCTURE CLOSURE (ENDOMECHANICALS) ×2 IMPLANT
DRAPE LAPAROSCOPIC ABDOMINAL (DRAPES) ×3 IMPLANT
DRAPE UTILITY XL STRL (DRAPES) ×6 IMPLANT
ELECT CAUTERY BLADE 6.4 (BLADE) ×5 IMPLANT
ELECT REM PT RETURN 9FT ADLT (ELECTROSURGICAL) ×3
ELECTRODE REM PT RTRN 9FT ADLT (ELECTROSURGICAL) ×1 IMPLANT
GLOVE BIO SURGEON STRL SZ7 (GLOVE) ×6 IMPLANT
GLOVE BIO SURGEON STRL SZ7.5 (GLOVE) ×2 IMPLANT
GLOVE BIO SURGEON STRL SZ8 (GLOVE) ×3 IMPLANT
GLOVE BIOGEL PI IND STRL 7.0 (GLOVE) IMPLANT
GLOVE BIOGEL PI IND STRL 8 (GLOVE) ×1 IMPLANT
GLOVE BIOGEL PI INDICATOR 7.0 (GLOVE) ×12
GLOVE BIOGEL PI INDICATOR 8 (GLOVE) ×4
GLOVE ECLIPSE 6.5 STRL STRAW (GLOVE) ×4 IMPLANT
GOWN STRL REUS W/ TWL LRG LVL3 (GOWN DISPOSABLE) ×1 IMPLANT
GOWN STRL REUS W/ TWL XL LVL3 (GOWN DISPOSABLE) ×1 IMPLANT
GOWN STRL REUS W/TWL LRG LVL3 (GOWN DISPOSABLE) ×15
GOWN STRL REUS W/TWL XL LVL3 (GOWN DISPOSABLE) ×6
KIT BASIN OR (CUSTOM PROCEDURE TRAY) ×3 IMPLANT
KIT ROOM TURNOVER OR (KITS) ×3 IMPLANT
LIQUID BAND (GAUZE/BANDAGES/DRESSINGS) ×3 IMPLANT
MESH PROLENE PML 12X12 (Mesh General) ×2 IMPLANT
NDL HYPO 25GX1X1/2 BEV (NEEDLE) ×1 IMPLANT
NEEDLE HYPO 25GX1X1/2 BEV (NEEDLE) ×3 IMPLANT
NS IRRIG 1000ML POUR BTL (IV SOLUTION) ×3 IMPLANT
PACK SURGICAL SETUP 50X90 (CUSTOM PROCEDURE TRAY) ×3 IMPLANT
PAD ARMBOARD 7.5X6 YLW CONV (MISCELLANEOUS) ×3 IMPLANT
PEN SKIN MARKING BROAD (MISCELLANEOUS) ×2 IMPLANT
PENCIL BUTTON HOLSTER BLD 10FT (ELECTRODE) ×5 IMPLANT
SPONGE GAUZE 4X4 12PLY STER LF (GAUZE/BANDAGES/DRESSINGS) ×2 IMPLANT
SPONGE LAP 18X18 X RAY DECT (DISPOSABLE) ×3 IMPLANT
SUT MON AB 4-0 PC3 18 (SUTURE) ×3 IMPLANT
SUT NOVA NAB DX-16 0-1 5-0 T12 (SUTURE) ×3 IMPLANT
SUT PDS AB 1 CTX 36 (SUTURE) ×2 IMPLANT
SUT VIC AB 3-0 SH 18 (SUTURE) ×2 IMPLANT
SUT VIC AB 3-0 SH 27 (SUTURE) ×3
SUT VIC AB 3-0 SH 27X BRD (SUTURE) ×1 IMPLANT
SUT VICRYL AB 3 0 TIES (SUTURE) ×2 IMPLANT
SYR CONTROL 10ML LL (SYRINGE) ×3 IMPLANT
TAPE CLOTH SURG 6X10 WHT LF (GAUZE/BANDAGES/DRESSINGS) ×2 IMPLANT
TOWEL OR 17X24 6PK STRL BLUE (TOWEL DISPOSABLE) ×3 IMPLANT
TOWEL OR 17X26 10 PK STRL BLUE (TOWEL DISPOSABLE) ×3 IMPLANT
TUBE CONNECTING 12'X1/4 (SUCTIONS) ×1
TUBE CONNECTING 12X1/4 (SUCTIONS) ×2 IMPLANT
YANKAUER SUCT BULB TIP NO VENT (SUCTIONS) ×3 IMPLANT

## 2015-06-20 NOTE — H&P (Signed)
H&P   Eduardo [name] (MR# [id number])      H&P Info    Chief Strategy Officer Note Status Last Update User Last Update Date/Time   Eduardo Luna, Eduardo [name] Signed Eduardo Luna, Eduardo [name] 04/28/2015 11:46 AM    H&P    Expand All Collapse All   Eduardo [name] 04/28/2015 11:03 AM Location: [facility] Patient #: [id number] DOB: 1946-02-10 Married / Language: Cleophus Molt / Race: White Male History of Present Illness Eduardo Moores A. Mihika Surrette Eduardo [name]; 04/28/2015 11:45 AM) Patient words: f/u hernia   Pt returns in follow up of incisional hernia. In 2015 pt underwent right hemicolectomy for colon cancer by Eduardo [name] at [facility] and developed a fistula and was transfered to [facility] for post op management. He underwnt medical management and this failed so he underwent operative closure 3 months later.  He has developed a bulge at his incision and this is causing pain. No nausea or vomiting and his bowels are moving. Colonoscopy in Dec 2015 shows no cancer recurrence.  The patient is a 69 year old male   Allergies Eduardo [name], Eduardo [name]; 04/28/2015 11:04 AM) Niaspan *ANTIHYPERLIPIDEMICS* Tape 1"X5yd *MEDICAL DEVICES*  Medication History (Eduardo [name], Eduardo [name]; 04/28/2015 11:04 AM) Flonase (50MCG/ACT Suspension, Nasal) Active. Vicodin (5-300MG  Tablet, Oral) Active. Anusol-HC (25MG  Suppository, Rectal) Active. Reglan (10MG  Tablet, Oral) Active. Naproxen Sodium (220MG  Capsule, Oral) Active. Roxicet (5-325MG  Tablet, Oral) Active. Maxalt (10MG  Tablet, Oral) Active. Ventolin HFA (108 (90 Base)MCG/ACT Aerosol Soln, Inhalation) Active. Medications Reconciled    Vitals (Eduardo [name] Eduardo [name]; 04/28/2015 11:04 AM) 04/28/2015 11:03 AM Weight: 218 lb Height: 74in Body Surface Area: 2.27 m Body Mass Index: 27.99 kg/m Temp.: 97.15F(Temporal)  Pulse: 71 (Regular)  BP: 128/78 (Sitting, Left Arm, Standard)     Physical Exam (Eduardo [name] A. Delberta Folts Eduardo [name]; 04/28/2015 11:46 AM)  General Mental Status-Alert. General  Appearance-Consistent with stated age. Hydration-Well hydrated. Voice-Normal.  Head and Neck Head-normocephalic, atraumatic with no lesions or palpable masses. Trachea-midline. Thyroid Gland Characteristics - normal size and consistency.  Chest and Lung Exam Chest and lung exam reveals -quiet, even and easy respiratory effort with no use of accessory muscles and on auscultation, normal breath sounds, no adventitious sounds and normal vocal resonance. Inspection Chest Wall - Normal. Back - normal.  Cardiovascular Cardiovascular examination reveals -normal heart sounds, regular rate and rhythm with no murmurs and normal pedal pulses bilaterally.  Abdomen Note: 4 cm incisional hernia redicible mild tenderness to palpation.    Neurologic Neurologic evaluation reveals -alert and oriented x 3 with no impairment of recent or remote memory. Mental Status-Normal.  Musculoskeletal Normal Exam - Left-Upper Extremity Strength Normal and Lower Extremity Strength Normal. Normal Exam - Right-Upper Extremity Strength Normal and Lower Extremity Strength Normal.    Assessment & Plan (Eduardo [name] A. Raechelle Sarti Eduardo [name]; 04/28/2015 11:41 AM)  Eduardo [name] HERNIA (553.21  K43.2)  Current Plans Pt Education - [facility] Abdominal [facility] (Gross) The anatomy and the physiology was discussed. The pathophysiology and natural history of the disease was discussed. Options were discussed and recommendations were made. Technique, risks, benefits, & alternatives were discussed. Risks such as stroke, heart attack, bleeding, indection, death, and other risks discussed. Questions answered. The patient agrees to proceed. The anatomy & physiology of the digestive tract was discussed. The pathophysiology of intestinal obstruction was discussed. Natural history risks without surgery was discussed. Differential diagnosis discuseed. I feel the patient has failed non-operative therapies. The risks of no intervention  will lead to serious problems such as necrosis, perforation, dehydration, etc. that outweigh the operative  risks; therefore, I recommended abdominal exploration to diagnose & treat the source of the problem. Minimally Invasive & open techniques were discussed. I expressed a good likelihood that surgery will treat the problem.  Risks such as bleeding, infection, abscess, leak, reoperation, bowel resection, possible ostomy, hernia, heart attack, death, and other risks were discussed. I noted a good likelihood this will help address the problem. Goals of post-operative recovery were discussed as well. We will work to minimize complications. Questions were answered. The patient expresses understanding & wishes to proceed with surgery. The anatomy & physiology of the abdominal wall was discussed. The pathophysiology of hernias was discussed. Natural history risks without surgery including progeressive enlargement, pain, incarceration, & strangulation was discussed. Contributors to complications such as smoking, obesity, diabetes, prior surgery, etc were discussed.  I feel the risks of no intervention will lead to serious problems that outweigh the operative risks; therefore, I recommended surgery to reduce and repair the hernia. I explained laparoscopic techniques with possible need for an open approach. I noted the probable use of mesh to patch and/or buttress the hernia repair  Risks such as bleeding, infection, abscess, need for further treatment, heart attack, death, and other risks were discussed. I noted a good likelihood this will help address the problem. Goals of post-operative recovery were discussed as well. Possibility that this will not correct all symptoms was explained. I stressed the importance of low-impact activity, aggressive pain control, avoiding constipation, & not pushing through pain to minimize risk of post-operative chronic pain or injury. Possibility of reherniation especially with  smoking, obesity, diabetes, immunosuppression, and other health conditions was discussed. We will work to minimize complications.  An educational handout further explaining the pathology & treatment options was given as well. Questions were answered. The patient expresses understanding & wishes to proceed with surgery. Pt Education - Hernia: discussed with patient and provided information. Pt Education - Incisional hernia: discussed with patient and provided information. Pt Education - Overview of abdominal wall hernias in adults: discussed with patient and provided information.

## 2015-06-20 NOTE — Transfer of Care (Signed)
Immediate Anesthesia Transfer of Care Note  Patient: Eduardo [name]  Procedure(s) Performed: Procedure(s): INCISIONAL HERNIA REPAIR WITH MYOFASCIAL ADVANCEMENT FLAP AND MESH (N/A) INSERTION OF MESH (N/A)  Patient Location: PACU  Anesthesia Type:General  Level of Consciousness: awake, alert , oriented and patient cooperative  Airway & Oxygen Therapy: Patient Spontanous Breathing and Patient connected to nasal cannula oxygen  Post-op Assessment: Report given to RN and Post -op Vital signs reviewed and stable  Post vital signs: Reviewed and stable  Last Vitals:  Filed Vitals:   06/20/15 0610  BP: 128/85  Pulse: 58  Temp: 36.7 C  Resp: 20    Complications: No apparent anesthesia complications

## 2015-06-20 NOTE — Anesthesia Postprocedure Evaluation (Signed)
  Anesthesia Post-op Note  Patient: Eduardo [name]  Procedure(s) Performed: Procedure(s): INCISIONAL HERNIA REPAIR WITH MYOFASCIAL ADVANCEMENT FLAP AND MESH (N/A) INSERTION OF MESH (N/A)  Patient Location: PACU  Anesthesia Type:General  Level of Consciousness: awake and alert   Airway and Oxygen Therapy: Patient Spontanous Breathing  Post-op Pain: moderate  Post-op Assessment: Post-op Vital signs reviewed and Patient's Cardiovascular Status Stable              Post-op Vital Signs: Reviewed and stable  Last Vitals:  Filed Vitals:   06/20/15 1245  BP:   Pulse: 66  Temp:   Resp: 9    Complications: No apparent anesthesia complications

## 2015-06-20 NOTE — Anesthesia Procedure Notes (Signed)
Procedure Name: Intubation Date/Time: 06/20/2015 7:43 AM Performed by: Willeen Cass P Pre-anesthesia Checklist: Patient identified, Timeout performed, Emergency Drugs available, Suction available and Patient being monitored Patient Re-evaluated:Patient Re-evaluated prior to inductionOxygen Delivery Method: Circle system utilized Preoxygenation: Pre-oxygenation with 100% oxygen Intubation Type: IV induction Ventilation: Mask ventilation without difficulty Laryngoscope Size: Mac and 3 Grade View: Grade I Tube type: Oral Tube size: 7.5 mm Number of attempts: 1 Airway Equipment and Method: Stylet Placement Confirmation: ETT inserted through vocal cords under direct vision,  breath sounds checked- equal and bilateral and positive ETCO2 Secured at: 23 cm Tube secured with: Tape Dental Injury: Teeth and Oropharynx as per pre-operative assessment

## 2015-06-20 NOTE — Op Note (Signed)
Preoperative diagnosis: Incisional hernia reducible  Postoperative diagnosis: Same  Procedure: Repair of incisional hernia with mesh and myofascial advancement flaps 25 cm x 12 cm   Surgeon: Erroll Luna M.D.  Asst.: Dr. Rosendo Gros M.D.  EBL: Minimal  Anesthesia: Gen.  Drains: 27 French round drain submuscular space  IV fluids: 1200 mL crystalloid  Specimens none  Indications for procedure: The patient presents for repair of incisional hernia. Patient is a history of multiple laparotomies and has developed a bulge in the incision from those. CT scan shows a 7 cm x 8 cm fascial defect.The risk of hernia repair include bleeding,  Infection,   Recurrence of the hernia,  Mesh use, chronic pain,  Organ injury,  Bowel injury,  Bladder injury,   nerve injury with numbness around the incision,  Death,  and worsening of preexisting  medical problems.  The alternatives to surgery have been discussed as well..  Long term expectations of both operative and non operative treatments have been discussed.   The patient agrees to proceed.  Description of procedure: Patient met in holding area and questions answered. Patient taken back to the operating room and placed supine on the OR table. After induction of general anesthesia, the abdomen prepped and draped in a sterile fashion. Timeout was done to verify proper patient and procedure. Patient received 2 g of Ancef. Incision made down previous midline incision from just above the umbilicus down to approximately the pubic symphysis. The old scar was excised. Dissection was carried down to the midline fascia to very dense scar. The abdominal cavity was entered carefully Coker for used a taking down of dense intra-abdominal adhesions. This took about 30 minutes. Once abdominal wall was freed the small bowel: Reexamined and there is no signs of injury to either. We then mobilized the rectus abdominis muscles by incising the fascia mobilizing the posterior sheath  bilaterally. This was done along the entire length of the incision with approximately 25 cm. No sizable none similar fashion releasing the rectus abdominous muscle bilaterally from the posterior sheath. This mobilized quite nicely and was able to close the posterior sheath with #1 PDS. There was minimal tension on the suture line. A 12 x 25 cm piece of Prolene mesh was brought on the field and placed in a submuscular position. This was secured with Evaceal glue this allowed to dry. Is currently the defect nicely. Irrigation was used prior to doing this is no signs of any bleeding. 19 round drain placed in submuscular space fascia both rectus muscles were closed the midline 1 PDS. There is extensive scarring of the skin this was mobilized facilitate closure of the skin. Staples used for skin closure at this point. JP secured with 2-0 nylon placed to bulb suction. Dry dressing applied. Abdominal binder placed. All final counts sponge, needle and instruments were correct. Patient awoke taken recovery in satisfactory condition.

## 2015-06-20 NOTE — Interval H&P Note (Signed)
History and Physical Interval Note:  06/20/2015 7:17 AM  Eduardo [name]  has presented today for surgery, with the diagnosis of Incisional Hernia  The various methods of treatment have been discussed with the patient and family. After consideration of risks, benefits and other options for treatment, the patient has consented to  Procedure(s): INCISIONAL HERNIA REPAIR WITH MYOFASCIAL ADVANCEMENT FLAP AND MESH (N/A) INSERTION OF MESH (N/A) as a surgical intervention .  The patient's history has been reviewed, patient examined, no change in status, stable for surgery.  I have reviewed the patient's chart and labs.  Questions were answered to the patient's satisfaction.     Maeli Spacek A.

## 2015-06-20 NOTE — Anesthesia Preprocedure Evaluation (Addendum)
Anesthesia Evaluation  Patient identified by MRN, date of birth, ID band Patient awake    Reviewed: Allergy & Precautions, NPO status , Patient's Chart, lab work & pertinent test results  Airway Mallampati: II  TM Distance: >3 FB Neck ROM: Full    Dental  (+) Teeth Intact, Dental Advisory Given   Pulmonary neg pulmonary ROS,    Pulmonary exam normal       Cardiovascular negative cardio ROS Normal cardiovascular examRhythm:Regular Rate:Normal     Neuro/Psych negative neurological ROS  negative psych ROS   GI/Hepatic Neg liver ROS, GERD-  Medicated,  Endo/Other  negative endocrine ROS  Renal/GU   negative genitourinary   Musculoskeletal  (+) Arthritis -, Osteoarthritis,    Abdominal   Peds negative pediatric ROS (+)  Hematology negative hematology ROS (+)   Anesthesia Other Findings   Reproductive/Obstetrics negative OB ROS                            Lab Results  Component Value Date   WBC 5.7 06/13/2015   HGB 13.2 06/13/2015   HCT 38.3* 06/13/2015   MCV 88.7 06/13/2015   PLT 184 06/13/2015   Lab Results  Component Value Date   CREATININE 0.94 06/13/2015   BUN 11 06/13/2015   NA 140 06/13/2015   K 3.7 06/13/2015   CL 106 06/13/2015   CO2 24 06/13/2015   Lab Results  Component Value Date   INR 1.21 10/28/2013   INR 1.07 10/18/2013   EKG: normal EKG, normal sinus rhythm.   Anesthesia Physical Anesthesia Plan  ASA: II  Anesthesia Plan: General   Post-op Pain Management:    Induction: Intravenous  Airway Management Planned: Oral ETT  Additional Equipment:   Intra-op Plan:   Post-operative Plan: Extubation in OR  Informed Consent: I have reviewed the patients History and Physical, chart, labs and discussed the procedure including the risks, benefits and alternatives for the proposed anesthesia with the patient or authorized representative who has indicated  his/her understanding and acceptance.   Dental advisory given  Plan Discussed with: CRNA  Anesthesia Plan Comments:         Anesthesia Quick Evaluation

## 2015-06-21 ENCOUNTER — Encounter (HOSPITAL_COMMUNITY): Payer: Self-pay | Admitting: Surgery

## 2015-06-21 LAB — CBC
HEMATOCRIT: 35.8 % — AB (ref 39.0–52.0)
Hemoglobin: 12 g/dL — ABNORMAL LOW (ref 13.0–17.0)
MCH: 30.4 pg (ref 26.0–34.0)
MCHC: 33.5 g/dL (ref 30.0–36.0)
MCV: 90.6 fL (ref 78.0–100.0)
PLATELETS: 176 10*3/uL (ref 150–400)
RBC: 3.95 MIL/uL — ABNORMAL LOW (ref 4.22–5.81)
RDW: 13.5 % (ref 11.5–15.5)
WBC: 7.6 10*3/uL (ref 4.0–10.5)

## 2015-06-21 LAB — BASIC METABOLIC PANEL
Anion gap: 5 (ref 5–15)
BUN: 7 mg/dL (ref 6–20)
CALCIUM: 8.2 mg/dL — AB (ref 8.9–10.3)
CO2: 26 mmol/L (ref 22–32)
CREATININE: 0.86 mg/dL (ref 0.61–1.24)
Chloride: 109 mmol/L (ref 101–111)
GFR calc Af Amer: >60 mL/min (ref >60)
GLUCOSE: 99 mg/dL (ref 65–99)
Potassium: 3.9 mmol/L (ref 3.5–5.1)
Sodium: 140 mmol/L (ref 135–145)

## 2015-06-21 MED ORDER — CETYLPYRIDINIUM CHLORIDE 0.05 % MT LIQD
7.0000 mL | Freq: Two times a day (BID) | OROMUCOSAL | Status: DC
  Administered 2015-06-21 (×2): 7 mL via OROMUCOSAL

## 2015-06-21 MED ORDER — TAMSULOSIN HCL 0.4 MG PO CAPS
0.4000 mg | ORAL_CAPSULE | Freq: Every day | ORAL | Status: DC
  Administered 2015-06-21 – 2015-06-24 (×4): 0.4 mg via ORAL
  Filled 2015-06-21 (×4): qty 1

## 2015-06-21 MED ORDER — CHLORHEXIDINE GLUCONATE 0.12 % MT SOLN
15.0000 mL | Freq: Two times a day (BID) | OROMUCOSAL | Status: DC
  Administered 2015-06-21 (×3): 15 mL via OROMUCOSAL
  Filled 2015-06-21 (×3): qty 15

## 2015-06-21 MED ORDER — PROMETHAZINE HCL 25 MG/ML IJ SOLN
12.5000 mg | Freq: Four times a day (QID) | INTRAMUSCULAR | Status: DC | PRN
  Administered 2015-06-21 (×2): 12.5 mg via INTRAVENOUS
  Filled 2015-06-21 (×2): qty 1

## 2015-06-21 NOTE — Progress Notes (Signed)
1 Day Post-Op  Subjective: Pain an issues  Objective: Vital signs in last 24 hours: Temp:  [97.4 F (36.3 C)-99.6 F (37.6 C)] 99.6 F (37.6 C) (08/31 0435) Pulse Rate:  [65-84] 80 (08/31 0435) Resp:  [8-24] 24 (08/31 0613) BP: (104-158)/(64-89) 128/78 mmHg (08/31 0435) SpO2:  [96 %-100 %] 97 % (08/31 0613) FiO2 (%):  [2 %] 2 % (08/30 1500)    Intake/Output from previous day: 08/30 0701 - 08/31 0700 In: 3092.4 [P.O.:60; I.V.:3032.4] Out: 1619 [Urine:1250; Drains:319; Blood:50] Intake/Output this shift:    Incision/Wound:dssg dry JP bloody  Abdomen flat  Appropriately  Sore   Lab Results:   Recent Labs  06/20/15 1520 06/21/15 0344  WBC 10.0 7.6  HGB 11.9* 12.0*  HCT 34.5* 35.8*  PLT 172 176   BMET  Recent Labs  06/20/15 1520 06/21/15 0344  NA  --  140  K  --  3.9  CL  --  109  CO2  --  26  GLUCOSE  --  99  BUN  --  7  CREATININE 0.91 0.86  CALCIUM  --  8.2*   PT/INR No results for input(s): LABPROT, INR in the last 72 hours. ABG No results for input(s): PHART, HCO3 in the last 72 hours.  Invalid input(s): PCO2, PO2  Studies/Results: No results found.  Anti-infectives: Anti-infectives    Start     Dose/Rate Route Frequency Ordered Stop   06/20/15 1600  ceFAZolin (ANCEF) IVPB 2 g/50 mL premix     2 g 100 mL/hr over 30 Minutes Intravenous 3 times per day 06/20/15 1444 06/20/15 1626   06/20/15 0700  ceFAZolin (ANCEF) 3 g in dextrose 5 % 50 mL IVPB     3 g 160 mL/hr over 30 Minutes Intravenous To ShortStay Surgical 06/19/15 1315 06/20/15 0739      Assessment/Plan: s/p Procedure(s): INCISIONAL HERNIA REPAIR WITH MYOFASCIAL ADVANCEMENT FLAP AND MESH (N/A) INSERTION OF MESH (N/A) OOB D/C foley Clears  Decrease IVF rate I/O cath as needed   LOS: 1 day    Alcides Nutting A. 06/21/2015

## 2015-06-21 NOTE — Progress Notes (Signed)
Pt c/o pressure where bladder is but has no urge to urinate.  Unable to bladder scan d/t surgical insiscion/dressing.  Pt in and out cathed and obtained 400cc amber urine. Pt states pressure is relieved.  Will continue to monitor. Graceann Congress

## 2015-06-21 NOTE — Progress Notes (Signed)
As pt got up to ambulate in hallway blood started leaking from around JP site.  Pt's dressing was saturated and changed.  MD Tseui made aware and ordered to make sure CBC was ordered for 06/22/15 am.  JP drain still appears to be functioning correctly.  40cc bloody drainage emptied from drain.  Will continue to monitor. Graceann Congress

## 2015-06-22 LAB — CBC
HEMATOCRIT: 33.6 % — AB (ref 39.0–52.0)
HEMOGLOBIN: 11.6 g/dL — AB (ref 13.0–17.0)
MCH: 31.7 pg (ref 26.0–34.0)
MCHC: 34.5 g/dL (ref 30.0–36.0)
MCV: 91.8 fL (ref 78.0–100.0)
Platelets: 168 10*3/uL (ref 150–400)
RBC: 3.66 MIL/uL — ABNORMAL LOW (ref 4.22–5.81)
RDW: 13.5 % (ref 11.5–15.5)
WBC: 8.8 10*3/uL (ref 4.0–10.5)

## 2015-06-22 MED ORDER — POLYETHYLENE GLYCOL 3350 17 G PO PACK
17.0000 g | PACK | Freq: Every day | ORAL | Status: DC
  Administered 2015-06-22 – 2015-06-24 (×3): 17 g via ORAL
  Filled 2015-06-22 (×3): qty 1

## 2015-06-22 MED ORDER — PSEUDOEPHEDRINE HCL 30 MG/5ML PO SYRP
30.0000 mg | ORAL_SOLUTION | ORAL | Status: DC | PRN
  Filled 2015-06-22: qty 5

## 2015-06-22 MED ORDER — PSEUDOEPHEDRINE HCL 30 MG PO TABS
30.0000 mg | ORAL_TABLET | ORAL | Status: DC | PRN
  Administered 2015-06-22 – 2015-06-23 (×3): 30 mg via ORAL
  Filled 2015-06-22 (×5): qty 1

## 2015-06-22 MED ORDER — BETHANECHOL CHLORIDE 10 MG PO TABS
10.0000 mg | ORAL_TABLET | Freq: Four times a day (QID) | ORAL | Status: DC
  Administered 2015-06-22 – 2015-06-24 (×8): 10 mg via ORAL
  Filled 2015-06-22 (×13): qty 1

## 2015-06-22 NOTE — Progress Notes (Signed)
Pt does not have urge to urinate and no feeling of "pressure" in bladder.  Bladder scan volume reveals only 118mL.  Will not I&O cath or place foley at this time.  Pt states he hopes to walk some this morning.  This RN will also update day RN on pt's bladder scan volume and pt's difficulty urinating. Graceann Congress

## 2015-06-22 NOTE — Progress Notes (Signed)
2 Days Post-Op  Subjective: Urinary retention an issue Had some drainage around drain last night this has stopped looks better overall no BM or flatus no vomiting  Objective: Vital signs in last 24 hours: Temp:  [98.4 F (36.9 C)-98.9 F (37.2 C)] 98.4 F (36.9 C) (09/01 0529) Pulse Rate:  [76-98] 76 (09/01 0529) Resp:  [16-21] 19 (09/01 0529) BP: (96-133)/(60-78) 106/67 mmHg (09/01 0529) SpO2:  [95 %-99 %] 99 % (09/01 0529) Weight:  [101.606 kg (224 lb)] 101.606 kg (224 lb) (08/31 0743) Last BM Date:  (prior to admission)  Intake/Output from previous day: 08/31 0701 - 09/01 0700 In: 1044.2 [P.O.:360; I.V.:684.2] Out: 1805 [Urine:1650; Drains:155] Intake/Output this shift:    Incision/Wound:CDI dressing clean jp serous soft ND  Lab Results:   Recent Labs  06/21/15 0344 06/22/15 0455  WBC 7.6 8.8  HGB 12.0* 11.6*  HCT 35.8* 33.6*  PLT 176 168   BMET  Recent Labs  06/20/15 1520 06/21/15 0344  NA  --  140  K  --  3.9  CL  --  109  CO2  --  26  GLUCOSE  --  99  BUN  --  7  CREATININE 0.91 0.86  CALCIUM  --  8.2*   PT/INR No results for input(s): LABPROT, INR in the last 72 hours. ABG No results for input(s): PHART, HCO3 in the last 72 hours.  Invalid input(s): PCO2, PO2  Studies/Results: No results found.  Anti-infectives: Anti-infectives    Start     Dose/Rate Route Frequency Ordered Stop   06/20/15 1600  ceFAZolin (ANCEF) IVPB 2 g/50 mL premix     2 g 100 mL/hr over 30 Minutes Intravenous 3 times per day 06/20/15 1444 06/20/15 1626   06/20/15 0700  ceFAZolin (ANCEF) 3 g in dextrose 5 % 50 mL IVPB     3 g 160 mL/hr over 30 Minutes Intravenous To ShortStay Surgical 06/19/15 1315 06/20/15 0739      Assessment/Plan: s/p Procedure(s): INCISIONAL HERNIA REPAIR WITH MYOFASCIAL ADVANCEMENT FLAP AND MESH (N/A) INSERTION OF MESH (N/A) Urinary retention  Add urecholine to flomax encourage ambulation try to avoid foley if possible Clear liquid  diet Drainage has stopped HGB stable this am    LOS: 2 days    Reiley Bertagnolli A. 06/22/2015

## 2015-06-23 MED ORDER — HYDROMORPHONE HCL 2 MG PO TABS
2.0000 mg | ORAL_TABLET | ORAL | Status: DC | PRN
  Administered 2015-06-23 – 2015-06-24 (×4): 2 mg via ORAL
  Filled 2015-06-23 (×4): qty 1

## 2015-06-23 MED ORDER — HYDROMORPHONE HCL 1 MG/ML IJ SOLN
1.0000 mg | INTRAMUSCULAR | Status: DC | PRN
  Administered 2015-06-23 (×2): 1 mg via INTRAVENOUS
  Filled 2015-06-23 (×2): qty 1

## 2015-06-23 NOTE — Progress Notes (Signed)
Patient had bladder scan at 2030 with bladder scan volume of 155 and pt. did not feel bladder fullness. Patient had soft medium BMs x 2 (9/1;2200 and 9/2;0300).  Bladder scan again at 2252 with bladder scan volume of 292 and pt. felt fullness in bladder.  Did in & out cath and able to draw 300 mL amber urine.  Patient tolerated procedure well and relieved.

## 2015-06-23 NOTE — Progress Notes (Signed)
3 Days Post-Op  Subjective: MOVED BOWELS TWICE CANNOT URINATE AND HAS BEEN CATH MULTIPLE TIMES  PLACING FOLEY  THIS AM  Objective: Vital signs in last 24 hours: Temp:  [98 F (36.7 C)-99 F (37.2 C)] 98.4 F (36.9 C) (09/02 0521) Pulse Rate:  [83-90] 90 (09/02 0521) Resp:  [18-23] 20 (09/02 0521) BP: (106-135)/(61-87) 135/87 mmHg (09/02 0521) SpO2:  [92 %-98 %] 98 % (09/02 0521) Last BM Date: 06/22/15  Intake/Output from previous day: 09/01 0701 - 09/02 0700 In: 2864 [P.O.:1420; I.V.:1444] Out: 875 [Urine:800; Drains:75] Intake/Output this shift:    Incision/Wound:CDI JP serous  Flat abdomen   Lab Results:   Recent Labs  06/21/15 0344 06/22/15 0455  WBC 7.6 8.8  HGB 12.0* 11.6*  HCT 35.8* 33.6*  PLT 176 168   BMET  Recent Labs  06/20/15 1520 06/21/15 0344  NA  --  140  K  --  3.9  CL  --  109  CO2  --  26  GLUCOSE  --  99  BUN  --  7  CREATININE 0.91 0.86  CALCIUM  --  8.2*   PT/INR No results for input(s): LABPROT, INR in the last 72 hours. ABG No results for input(s): PHART, HCO3 in the last 72 hours.  Invalid input(s): PCO2, PO2  Studies/Results: No results found.  Anti-infectives: Anti-infectives    Start     Dose/Rate Route Frequency Ordered Stop   06/20/15 1600  ceFAZolin (ANCEF) IVPB 2 g/50 mL premix     2 g 100 mL/hr over 30 Minutes Intravenous 3 times per day 06/20/15 1444 06/20/15 1626   06/20/15 0700  ceFAZolin (ANCEF) 3 g in dextrose 5 % 50 mL IVPB     3 g 160 mL/hr over 30 Minutes Intravenous To ShortStay Surgical 06/19/15 1315 06/20/15 0739      Assessment/Plan: s/p Procedure(s): INCISIONAL HERNIA REPAIR WITH MYOFASCIAL ADVANCEMENT FLAP AND MESH (N/A) INSERTION OF MESH (N/A) Urinary retention  Still having issues despite medical management. Will place foley and leave in for the next 2 weeks.  Will get urology to see him as outpatient.   ADVANCE DIET D/C PCA OOB    LOS: 3 days    Meghna Hagmann A. 06/23/2015

## 2015-06-24 MED ORDER — TAMSULOSIN HCL 0.4 MG PO CAPS: 0.4000 mg | ORAL_CAPSULE | Freq: Every day | ORAL | Status: DC

## 2015-06-24 MED ORDER — ONDANSETRON 4 MG PO TBDP: 4.0000 mg | ORAL_TABLET | Freq: Four times a day (QID) | ORAL | Status: DC | PRN

## 2015-06-24 MED ORDER — HYDROMORPHONE HCL 2 MG PO TABS: 2.0000 mg | ORAL_TABLET | ORAL | Status: DC | PRN

## 2015-06-24 MED ORDER — POLYETHYLENE GLYCOL 3350 17 G PO PACK: 17.0000 g | PACK | Freq: Every day | ORAL | Status: DC

## 2015-06-24 NOTE — Discharge Summary (Signed)
Physician Discharge Summary  Patient ID: Eduardo [name] MRN: [id number] DOB/AGE: [birth year] 67 y.o.  Admit date: 06/20/2015 Discharge date: 06/24/2015  Admission Diagnoses:incisional hernia   Discharge Diagnoses:  Active Problems:   Incisional hernia, without obstruction or gangrene  Urinary retention  Discharged Condition: good  Hospital Course: Pt did well. He had issues with urinary retention and despite  I/O cath and medical management he could not void.  A foley was placed with leg bag and he was discharged home with outpatient urology follow up.  He moved his bowel on POD 3 and was tolerating his diet.  He was ambulating and taking pain medication by mouth. Wound was clean and JP serous.    Consults: None  Significant Diagnostic Studies: labs:  CBC    Component Value Date/Time   WBC 8.8 06/22/2015 0455   RBC 3.66* 06/22/2015 0455   HGB 11.6* 06/22/2015 0455   HCT 33.6* 06/22/2015 0455   PLT 168 06/22/2015 0455   MCV 91.8 06/22/2015 0455   MCH 31.7 06/22/2015 0455   MCHC 34.5 06/22/2015 0455   RDW 13.5 06/22/2015 0455   LYMPHSABS 1.6 06/13/2015 1330   MONOABS 0.5 06/13/2015 1330   EOSABS 0.2 06/13/2015 1330   BASOSABS 0.0 06/13/2015 1330     Treatments: surgery: incisional hernia repair with flaps and mesh   Discharge Exam: Blood pressure 120/71, pulse 75, temperature 98.8 F (37.1 C), temperature source Oral, resp. rate 18, height 6\' 2"  (1.88 m), weight 101.606 kg (224 lb), SpO2 95 %. Resp: clear to auscultation bilaterally Cardio: regular rate and rhythm, S1, S2 normal, no murmur, click, rub or gallop Male genitalia: normal, foley in place without complication Incision/Wound:CDI staples in place no redness or drainage JP in place   Disposition: 01-Home or Self Care     Medication List    ASK your doctor about these medications        fluticasone 50 MCG/ACT nasal spray  Commonly known as:  FLONASE  Place 2 sprays into both nostrils daily as needed for  allergies.     HYDROcodone-acetaminophen 5-325 MG per tablet  Commonly known as:  NORCO/VICODIN  Take 1 tablet by mouth every 6 (six) hours as needed (headaches).     LYRICA 75 MG capsule  Generic drug:  pregabalin  Take 75 mg by mouth daily as needed. For pain     PROBIOTIC DAILY PO  Take 1 capsule by mouth daily.     ranitidine 300 MG tablet  Commonly known as:  ZANTAC  Take 300 mg by mouth.     rizatriptan 10 MG tablet  Commonly known as:  MAXALT  Take 10 mg by mouth as needed for migraine. May repeat in 2 hours if needed     tamsulosin 0.4 MG Caps capsule  Commonly known as:  FLOMAX  Take 1 po QD until you pass the stone.     VENTOLIN HFA 108 (90 BASE) MCG/ACT inhaler  Generic drug:  albuterol  Inhale 1 puff into the lungs every 6 (six) hours as needed for wheezing or shortness of breath.     VITAMIN B-12 IJ  Inject as directed every 30 (thirty) days. Administered Monthly @@ Oncologist/[place]         Signed: Derrica Sieg A. 06/24/2015, 6:39 AM

## 2015-06-24 NOTE — Progress Notes (Signed)
Discharge instructions gone over with patient and wife. Home medications gone over. Prescription given, others electronically sent to patient's pharmacy. Dressing changed. Follow up appointment to be made. Patient and wife comfortable with doing dressing change at home, emptying jp drain, and leg bag. Patient is going home with leg bag. Wife demonstrated how to empty and record drainage with foley and jp drain. Diet, incisional care, and activity discussed. Signs and symptoms of infection and reasons to call the doctor gone over. Patient verbalized understanding of instructions.

## 2015-06-24 NOTE — Progress Notes (Signed)
4 Days Post-Op  Subjective: Feels better  Wants to go home   Objective: Vital signs in last 24 hours: Temp:  [98.3 F (36.8 C)-98.8 F (37.1 C)] 98.8 F (37.1 C) (09/03 0434) Pulse Rate:  [75-91] 75 (09/03 0434) Resp:  [18-20] 18 (09/03 0434) BP: (113-120)/(65-71) 120/71 mmHg (09/03 0434) SpO2:  [93 %-96 %] 95 % (09/03 0434) Last BM Date: 06/23/15  Intake/Output from previous day: 09/02 0701 - 09/03 0700 In: 2693.7 [P.O.:1420; I.V.:1273.7] Out: 920 [Urine:900; Drains:20] Intake/Output this shift: Total I/O In: 941.7 [P.O.:220; I.V.:721.7] Out: 470 [Urine:450; Drains:20]  Incision/Wound:CDI staple in place  JP serous   Lab Results:   Recent Labs  06/22/15 0455  WBC 8.8  HGB 11.6*  HCT 33.6*  PLT 168   BMET No results for input(s): NA, K, CL, CO2, GLUCOSE, BUN, CREATININE, CALCIUM in the last 72 hours. PT/INR No results for input(s): LABPROT, INR in the last 72 hours. ABG No results for input(s): PHART, HCO3 in the last 72 hours.  Invalid input(s): PCO2, PO2  Studies/Results: No results found.  Anti-infectives: Anti-infectives    Start     Dose/Rate Route Frequency Ordered Stop   06/20/15 1600  ceFAZolin (ANCEF) IVPB 2 g/50 mL premix     2 g 100 mL/hr over 30 Minutes Intravenous 3 times per day 06/20/15 1444 06/20/15 1626   06/20/15 0700  ceFAZolin (ANCEF) 3 g in dextrose 5 % 50 mL IVPB     3 g 160 mL/hr over 30 Minutes Intravenous To ShortStay Surgical 06/19/15 1315 06/20/15 0739      Assessment/Plan: s/p Procedure(s): INCISIONAL HERNIA REPAIR WITH MYOFASCIAL ADVANCEMENT FLAP AND MESH (N/A) INSERTION OF MESH (N/A) Urinary retention  Keep foley and go home with a leg bag.  Keep on flomax and have him follow up with urology next week. Discharge Follow up next Friday for staples and drain No lifting pushing or pulling  No driving  He can shower   LOS: 4 days    Keyandra Swenson A. 06/24/2015

## 2015-06-24 NOTE — Discharge Instructions (Signed)
[facility]      [facility] °[phone] ° °OPEN ABDOMINAL SURGERY: POST OP INSTRUCTIONS ° °Always review your discharge instruction sheet given to you by the facility where your surgery was performed. ° °IF YOU HAVE DISABILITY OR FAMILY LEAVE FORMS, YOU MUST BRING THEM TO THE OFFICE FOR PROCESSING.  PLEASE DO NOT GIVE THEM TO YOUR DOCTOR. ° °1. A prescription for pain medication may be given to you upon discharge.  Take your pain medication as prescribed, if needed.  If narcotic pain medicine is not needed, then you may take acetaminophen (Tylenol) or ibuprofen (Advil) as needed. °2. Take your usually prescribed medications unless otherwise directed. °3. If you need a refill on your pain medication, please contact your pharmacy. They will contact our office to request authorization.  Prescriptions will not be filled after 5pm or on week-ends. °4. You should follow a light diet the first few days after arrival home, such as soup and crackers, pudding, etc.unless your doctor has advised otherwise. A high-fiber, low fat diet can be resumed as tolerated.   Be sure to include lots of fluids daily. Most patients will experience some swelling and bruising on the chest and neck area.  Ice packs will help.  Swelling and bruising can take several days to resolve °5. Most patients will experience some swelling and bruising in the area of the incision. Ice pack will help. Swelling and bruising can take several days to resolve..  °6. It is common to experience some constipation if taking pain medication after surgery.  Increasing fluid intake and taking a stool softener will usually help or prevent this problem from occurring.  A mild laxative (Milk of Magnesia or Miralax) should be taken according to package directions if there are no bowel movements after 48 hours. °7.  You may have steri-strips (small skin tapes) in place directly over the incision.  These strips should be left on the skin for 7-10 days.  If your  surgeon used skin glue on the incision, you may shower in 24 hours.  The glue will flake off over the next 2-3 weeks.  Any sutures or staples will be removed at the office during your follow-up visit. You may find that a light gauze bandage over your incision may keep your staples from being rubbed or pulled. You may shower and replace the bandage daily. °8. ACTIVITIES:  You may resume regular (light) daily activities beginning the next day--such as daily self-care, walking, climbing stairs--gradually increasing activities as tolerated.  You may have sexual intercourse when it is comfortable.  Refrain from any heavy lifting or straining until approved by your doctor. °a. You may drive when you no longer are taking prescription pain medication, you can comfortably wear a seatbelt, and you can safely maneuver your car and apply brakes °b. Return to Work: ___________________________________ °9. You should see your doctor in the office for a follow-up appointment approximately two weeks after your surgery.  Make sure that you call for this appointment within a day or two after you arrive home to insure a convenient appointment time. °OTHER INSTRUCTIONS:  °_____________________________________________________________ °_____________________________________________________________ ° °WHEN TO CALL YOUR DOCTOR: °1. Fever over 101.0 °2. Inability to urinate °3. Nausea and/or vomiting °4. Extreme swelling or bruising °5. Continued bleeding from incision. °6. Increased pain, redness, or drainage from the incision. °7. Difficulty swallowing or breathing °8. Muscle cramping or spasms. °9. Numbness or tingling in hands or feet or around lips. ° °The clinic staff is available to   answer your questions during regular business hours.  Please dont hesitate to call and ask to speak to one of the nurses if you have concerns.  For further questions, please visit [email or web address]    Indwelling Urinary Catheter  Care You have been given a flexible tube (catheter) used to drain the bladder. Catheters are often used when a person has difficulty urinating due to blockage, bleeding, infection, or inability to control bladder or bowel movements (incontinence). A catheter requires daily care to prevent infection and blockage. HOME CARE INSTRUCTIONS  Do the following to reduce the risk of infection. Antibiotic medicines cannot prevent infections. Limit the number of bacteria entering your bladder  Wash your hands for 2 minutes with soapy water before and after handling the catheter.  Wash your bottom and the entire catheter twice daily, as well as after each bowel movement. Wash the tip of the penis or just above the vaginal opening with soap and warm water, rinse, and then wash the rectal area. Always wash from front to back.  When changing from the leg bag to overnight bag or from the overnight bag to leg bag, thoroughly clean the end of the catheter where it connects to the tubing with an alcohol wipe.  Clean the leg bag and overnight bag daily after use. Replace your drainage bags weekly.  Always keep the tubing and bag below the level of your bladder. This allows your urine to drain properly. Lifting the bag or tubing above the level of your bladder will cause dirty urine to flow back into your bladder. If you must briefly lift the bag higher than your bladder, pinch the catheter or tubing to prevent backflow.  Drink enough water and fluids to keep your urine clear or pale yellow, or as directed by your caregiver. This will flush bacteria out of the bladder. Protect tissues from injury  Attach the catheter to your leg so there is no tension on the catheter. Use adhesive tape or a leg strap. If you are using adhesive tape, remove any sticky residue left behind by the previous tape you used.  Place your leg bag on your lower leg. Fasten the straps securely and comfortably.  Do not remove the catheter  yourself unless you have been instructed how to do so. Keep the urinary pathway open  Check throughout the day to be sure your catheter is working and urine is draining freely. Make sure the tubing does not become kinked.  Do not let the drainage bag overfill. SEEK IMMEDIATE MEDICAL CARE IF:   The catheter becomes blocked. Urine is not draining.  Urine is leaking.  You have any pain.  You have a fever. Document Released: 10/07/2005 Document Revised: 09/23/2012 Document Reviewed: 03/08/2010 [facility] Patient Information 2015 [place], [place]. This information is not intended to replace advice given to you by your health care provider. Make sure you discuss any questions you have with your health care provider. Bulb Drain Home Care A bulb drain consists of a thin rubber tube and a soft, round bulb that creates a gentle suction. The rubber tube is placed in the area where you had surgery. A bulb is attached to the end of the tube that is outside the body. The bulb drain removes excess fluid that normally builds up in a surgical wound after surgery. The color and amount of fluid will vary. Immediately after surgery, the fluid is bright red and is a little thicker than water. It may gradually change to a yellow or  pink color and become more thin and water-like. When the amount decreases to about 1 or 2 tbsp in 24 hours, your health care provider will usually remove it. DAILY CARE  Keep the bulb flat (compressed) at all times, except while emptying it. The flatness creates suction. You can flatten the bulb by squeezing it firmly in the middle and then closing the cap.  Keep sites where the tube enters the skin dry and covered with a bandage (dressing).  Secure the tube 1-2 in (2.5-5.1 cm) below the insertion sites to keep it from pulling on your stitches. The tube is stitched in place and will not slip out.  Secure the bulb as directed by your health care provider.  For the first 3 days after  surgery, there usually is more fluid in the bulb. Empty the bulb whenever it becomes half full because the bulb does not create enough suction if it is too full. The bulb could also overflow. Write down how much fluid you remove each time you empty your drain. Add up the amount removed in 24 hours.  Empty the bulb at the same time every day once the amount of fluid decreases and you only need to empty it once a day. Write down the amounts and the 24-hour totals to give to your health care provider. This helps your health care provider know when the tubes can be removed. EMPTYING THE BULB DRAIN Before emptying the bulb, get a measuring cup, a piece of paper and a pen, and wash your hands.  Gently run your fingers down the tube (stripping) to empty any drainage from the tubing into the bulb. This may need to be done several times a day to clear the tubing of clots and tissue.  Open the bulb cap to release suction, which causes it to inflate. Do not touch the inside of the cap.  Gently run your fingers down the tube (stripping) to empty any drainage from the tubing into the bulb.  Hold the cap out of the way, and pour fluid into the measuring cup.   Squeeze the bulb to provide suction.  Replace the cap.   Check the tape that holds the tube to your skin. If it is becoming loose, you can remove the loose piece of tape and apply a new one. Then, pin the bulb to your shirt.   Write down the amount of fluid you emptied out. Write down the date and each time you emptied your bulb drain. (If there are 2 bulbs, note the amount of drainage from each bulb and keep the totals separate. Your health care provider will want to know the total amounts for each drain and which tube is draining more.)   Flush the fluid down the toilet and wash your hands.   Call your health care provider once you have less than 2 tbsp of fluid collecting in the bulb drain every 24 hours. If there is drainage around the  tube site, change dressings and keep the area dry. Cleanse around tube with sterile saline and place dry gauze around site. This gauze should be changed when it is soiled. If it stays clean and unsoiled, it should still be changed daily.  SEEK MEDICAL CARE IF:  Your drainage has a bad smell or is cloudy.   You have a fever.   Your drainage is increasing instead of decreasing.   Your tube fell out.   You have redness or swelling around the tube site.  You have drainage from a surgical wound.   Your bulb drain will not stay flat after you empty it.  MAKE SURE YOU:   Understand these instructions.  Will watch your condition.  Will get help right away if you are not doing well or get worse. Document Released: 10/04/2000 Document Revised: 02/21/2014 Document Reviewed: 03/11/2012 [facility] Patient Information 2015 [place], [place]. This information is not intended to replace advice given to you by your health care provider. Make sure you discuss any questions you have with your health care provider.

## 2015-06-26 ENCOUNTER — Emergency Department (HOSPITAL_COMMUNITY)
Admission: EM | Admit: 2015-06-26 | Discharge: 2015-06-26 | Disposition: A | Discharge status: Home / Self Care | Payer: Managed Care, Other (non HMO) | Attending: Emergency Medicine | Admitting: Emergency Medicine

## 2015-06-26 ENCOUNTER — Encounter (HOSPITAL_COMMUNITY): Payer: Self-pay | Admitting: *Deleted

## 2015-06-26 DIAGNOSIS — Z85828 Personal history of other malignant neoplasm of skin: Secondary | ICD-10-CM | POA: Diagnosis not present

## 2015-06-26 DIAGNOSIS — G43909 Migraine, unspecified, not intractable, without status migrainosus: Secondary | ICD-10-CM | POA: Diagnosis not present

## 2015-06-26 DIAGNOSIS — Y998 Other external cause status: Secondary | ICD-10-CM | POA: Insufficient documentation

## 2015-06-26 DIAGNOSIS — L500 Allergic urticaria: Secondary | ICD-10-CM | POA: Insufficient documentation

## 2015-06-26 DIAGNOSIS — Z8719 Personal history of other diseases of the digestive system: Secondary | ICD-10-CM | POA: Diagnosis not present

## 2015-06-26 DIAGNOSIS — Z85038 Personal history of other malignant neoplasm of large intestine: Secondary | ICD-10-CM | POA: Insufficient documentation

## 2015-06-26 DIAGNOSIS — Y9389 Activity, other specified: Secondary | ICD-10-CM | POA: Insufficient documentation

## 2015-06-26 DIAGNOSIS — Z8739 Personal history of other diseases of the musculoskeletal system and connective tissue: Secondary | ICD-10-CM | POA: Insufficient documentation

## 2015-06-26 DIAGNOSIS — Z79899 Other long term (current) drug therapy: Secondary | ICD-10-CM | POA: Diagnosis not present

## 2015-06-26 DIAGNOSIS — Y9289 Other specified places as the place of occurrence of the external cause: Secondary | ICD-10-CM | POA: Diagnosis not present

## 2015-06-26 DIAGNOSIS — Z87442 Personal history of urinary calculi: Secondary | ICD-10-CM | POA: Diagnosis not present

## 2015-06-26 DIAGNOSIS — T7840XA Allergy, unspecified, initial encounter: Secondary | ICD-10-CM | POA: Insufficient documentation

## 2015-06-26 DIAGNOSIS — L509 Urticaria, unspecified: Secondary | ICD-10-CM

## 2015-06-26 DIAGNOSIS — X58XXXA Exposure to other specified factors, initial encounter: Secondary | ICD-10-CM | POA: Insufficient documentation

## 2015-06-26 DIAGNOSIS — R21 Rash and other nonspecific skin eruption: Secondary | ICD-10-CM | POA: Diagnosis present

## 2015-06-26 MED ORDER — FAMOTIDINE IN NACL 20-0.9 MG/50ML-% IV SOLN
INTRAVENOUS | Status: AC
  Administered 2015-06-26: 20 mg
  Filled 2015-06-26: qty 50

## 2015-06-26 MED ORDER — METHYLPREDNISOLONE SODIUM SUCC 125 MG IJ SOLR
INTRAMUSCULAR | Status: AC
  Administered 2015-06-26: 125 mg
  Filled 2015-06-26: qty 2

## 2015-06-26 MED ORDER — CETIRIZINE HCL 10 MG PO CAPS
10.0000 mg | ORAL_CAPSULE | Freq: Every day | ORAL | Status: DC | PRN
Start: 2015-06-26 — End: 2017-11-20

## 2015-06-26 MED ORDER — FAMOTIDINE 20 MG PO TABS: 20.0000 mg | ORAL_TABLET | Freq: Two times a day (BID) | ORAL | Status: DC

## 2015-06-26 MED ORDER — DIPHENHYDRAMINE HCL 50 MG/ML IJ SOLN
INTRAMUSCULAR | Status: AC
  Administered 2015-06-26: 25 mg
  Filled 2015-06-26: qty 1

## 2015-06-26 MED ORDER — PREDNISONE 20 MG PO TABS: ORAL_TABLET | ORAL | Status: DC

## 2015-06-26 NOTE — ED Notes (Signed)
Patient resting in bed. Family at bedside. Patients rash and redness has decreased, and patient states he feels like he is ready to go home. EDP notified.

## 2015-06-26 NOTE — ED Notes (Signed)
Patient verbalizes understanding of discharge instructions, prescription medications, home care and follow up care. Patient ambulatory out of department at this time with family. 

## 2015-06-26 NOTE — ED Notes (Signed)
Pt red, raised rash on his abdomen, back, neck and legs. Pt took 3, 25 mg benadryl and his epi pen. Pt has a hx of allergic reactions. Pt had recent abdominal surgery.

## 2015-06-26 NOTE — Discharge Instructions (Signed)
Stop using the abdominal binder you were given in the hospital. Use the athletic binder you have used in the past without problems. Call the surgeon on call in the morning to discuss whether you should continue with the prednisone and can delay healing or increase your risk of infection. Take the Pepcid and Zyrtec daily as prescribed for at least the next week. Try to stay cool, heat will make the rash worse. Return to the emergency department if you have trouble breathing, feeling like your tongue or throat are swelling, or rashes getting worse or you are unable to swallow.   Hives Hives are itchy, red, swollen areas of the skin. They can vary in size and location on your body. Hives can come and go for hours or several days (acute hives) or for several weeks (chronic hives). Hives do not spread from person to person (noncontagious). They may get worse with scratching, exercise, and emotional stress. CAUSES   Allergic reaction to food, additives, or drugs.  Infections, including the common cold.  Illness, such as vasculitis, lupus, or thyroid disease.  Exposure to sunlight, heat, or cold.  Exercise.  Stress.  Contact with chemicals. SYMPTOMS   Red or white swollen patches on the skin. The patches may change size, shape, and location quickly and repeatedly.  Itching.  Swelling of the hands, feet, and face. This may occur if hives develop deeper in the skin. DIAGNOSIS  Your caregiver can usually tell what is wrong by performing a physical exam. Skin or blood tests may also be done to determine the cause of your hives. In some cases, the cause cannot be determined. TREATMENT  Mild cases usually get better with medicines such as antihistamines. Severe cases may require an emergency epinephrine injection. If the cause of your hives is known, treatment includes avoiding that trigger.  HOME CARE INSTRUCTIONS   Avoid causes that trigger your hives.  Take antihistamines as directed by your  caregiver to reduce the severity of your hives. Non-sedating or low-sedating antihistamines are usually recommended. Do not drive while taking an antihistamine.  Take any other medicines prescribed for itching as directed by your caregiver.  Wear loose-fitting clothing.  Keep all follow-up appointments as directed by your caregiver. SEEK MEDICAL CARE IF:   You have persistent or severe itching that is not relieved with medicine.  You have painful or swollen joints. SEEK IMMEDIATE MEDICAL CARE IF:   You have a fever.  Your tongue or lips are swollen.  You have trouble breathing or swallowing.  You feel tightness in the throat or chest.  You have abdominal pain. These problems may be the first sign of a life-threatening allergic reaction. Call your local emergency services (911 in [place] MAKE SURE YOU:   Understand these instructions.  Will watch your condition.  Will get help right away if you are not doing well or get worse. Document Released: 10/07/2005 Document Revised: 10/12/2013 Document Reviewed: 12/31/2011 [facility] Patient Information 2015 [place], [place]. This information is not intended to replace advice given to you by your health care provider. Make sure you discuss any questions you have with your health care provider.

## 2015-06-26 NOTE — ED Notes (Signed)
Patient states rash, that increased last night. Patient states he took an epi-pen and 75mg  of benadryl PTA. Patient also states he felt like he could breath well. Patient is A&O at this time. Family at bedside.

## 2015-06-26 NOTE — ED Provider Notes (Signed)
CSN: [id number]     Arrival date & time 06/26/15  0057 History   First MD Initiated Contact with Patient 06/26/15 0115     Chief Complaint  Patient presents with  . Allergic Reaction     (Consider location/radiation/quality/duration/timing/severity/associated sxs/prior Treatment) HPI patient reports he had her knee surgery done on Tuesday, August 30 by Dr. Brantley Stage. He was in the hospital until yesterday at noon when he was discharged. He reports they were having him wear an abdominal binder and he started breaking out 3 days after he started wearing it. He states at first it was mainly in his armpits and weren't fitting tight. However he states it is getting worse since he got home. He states in the hospital the rash did not itch. However last night it did start itching. He took 2 Benadryl last night which control the itching but did not resolve the rash. Tonight he started feeling short of breath about 30 minutes prior to arrival and felt like he had a constriction in his throat. He took a EpiPen and took 3 Benadryl. He reports he has had reactions like this to detergents and to latex in the past. He states he has a sports binder that he's worn in the past that he is going to wear now instead of the binder he was given in the hospital.  PCP Dr Gerarda Fraction Surgery Dr Brantley Stage  Past Medical History  Diagnosis Date  . Wears glasses   . Neuropathy 2010  . History of seasonal allergies   . GERD (gastroesophageal reflux disease)   . Kidney stone   . Migraine     "maybe 1-2/month" (06/20/2015)  . DDD (degenerative disc disease), cervical   . Colon cancer 10/20/13    colon resection  . Skin cancer of face     "froze them off"   Past Surgical History  Procedure Laterality Date  . Excisional hemorrhoidectomy    . Carpal tunnel release Left 2011  . Knee arthroscopy Bilateral 2001  . Upper gi endoscopy    . Colonoscopy    . Inguinal hernia repair Left     "@ [facility]"  . Tonsillectomy    .  Carpal tunnel release Right 01/07/2013    Procedure: CARPAL TUNNEL RELEASE;  Surgeon: Tennis Must, MD;  Location: [place];  Service: Orthopedics;  Laterality: Right;  . Colonoscopy N/A 10/18/2013    Procedure: COLONOSCOPY;  Surgeon: Danie Binder, MD;  [facility];  Service: Endoscopy;  Laterality: N/A;  2:00-moved to 1030 Pam to notify pt  . Colon resection N/A 10/20/2013    Procedure:  LAPAROSCOPIC hand assisted partial colectomy;  Surgeon: Jamesetta So, MD;  [facility]: [facility];  Service: General;  Laterality: N/A;  . Appendectomy  10/20/13    (with right hemicolectomy)  . Inguinal hernia repair Bilateral     "[place]"  . Take down of intestinal fistula N/A 02/02/2014    Procedure: TAKE DOWN OF ENTEROCUTANEOUS  FISTULA;  Surgeon: Marcello Moores A. Cornett, MD;  Location: [place];  Service: General;  Laterality: N/A;  . Colonoscopy N/A 09/22/2014    Procedure: COLONOSCOPY;  Surgeon: Rogene Houston, MD;  [facility];  Service: Endoscopy;  Laterality: N/A;  1200  . Incisional hernia repair  06/20/2015    WITH MYOFASCIAL ADVANCEMENT FLAP AND MESH   . Incisional hernia repair N/A 06/20/2015    Procedure: INCISIONAL HERNIA REPAIR WITH MYOFASCIAL ADVANCEMENT FLAP AND MESH;  Surgeon: Marcello Moores  Cornett, MD;  Location: [place];  Service: General;  Laterality: N/A;  . Insertion of mesh N/A 06/20/2015    Procedure: INSERTION OF MESH;  Surgeon: Erroll Luna, MD;  Location: [facility];  Service: General;  Laterality: N/A;   Family History  Problem Relation Age of Onset  . Colon polyps Father   . Colon polyps Paternal Uncle   . Colon polyps Maternal Grandfather    Social History  Substance Use Topics  . Smoking status: Never Smoker   . Smokeless tobacco: Never Used  . Alcohol Use: No  lives at home Lives with spouse  Review of Systems  All other systems reviewed and are negative.     Allergies  Niaspan and Tape  Home Medications   Prior to Admission medications    Medication Sig Start Date End Date Taking? Authorizing Provider  Cetirizine HCl (ZYRTEC ALLERGY) 10 MG CAPS Take 1 capsule (10 mg total) by mouth daily as needed. 06/26/15   Rolland Porter, MD  Cyanocobalamin (VITAMIN B-12 IJ) Inject as directed every 30 (thirty) days. Administered Monthly @@ Oncologist/[place]    Historical Provider, MD  famotidine (PEPCID) 20 MG tablet Take 1 tablet (20 mg total) by mouth 2 (two) times daily. 06/26/15   Rolland Porter, MD  fluticasone (FLONASE) 50 MCG/ACT nasal spray Place 2 sprays into both nostrils daily as needed for allergies.  09/28/13   Historical Provider, MD  HYDROmorphone (DILAUDID) 2 MG tablet Take 1 tablet (2 mg total) by mouth every 2 (two) hours as needed for severe pain. 06/24/15   Erroll Luna, MD  LYRICA 75 MG capsule Take 75 mg by mouth daily as needed. For pain 11/22/14   Historical Provider, MD  ondansetron (ZOFRAN-ODT) 4 MG disintegrating tablet Take 1 tablet (4 mg total) by mouth every 6 (six) hours as needed for nausea. 06/24/15   Erroll Luna, MD  polyethylene glycol (MIRALAX / GLYCOLAX) packet Take 17 g by mouth daily. 06/24/15   Erroll Luna, MD  predniSONE (DELTASONE) 20 MG tablet Take 3 po QD x 3d , then 2 po QD x 3d then 1 po QD x 3d 06/26/15   Rolland Porter, MD  Probiotic Product (PROBIOTIC DAILY PO) Take 1 capsule by mouth daily.     Historical Provider, MD  ranitidine (ZANTAC) 300 MG tablet Take 300 mg by mouth. 11/22/14   Historical Provider, MD  rizatriptan (MAXALT) 10 MG tablet Take 10 mg by mouth as needed for migraine. May repeat in 2 hours if needed    Historical Provider, MD  tamsulosin (FLOMAX) 0.4 MG CAPS capsule Take 1 capsule (0.4 mg total) by mouth daily. 06/24/15   Erroll Luna, MD  VENTOLIN HFA 108 (90 BASE) MCG/ACT inhaler Inhale 1 puff into the lungs every 6 (six) hours as needed for wheezing or shortness of breath.  11/26/13   Historical Provider, MD   BP 125/85 mmHg  Pulse 99  Resp 32  Ht 6\' 2"  (1.88 m)  Wt 208 lb (94.348 kg)  BMI 26.69  kg/m2  SpO2 95%  Vital signs normal   Physical Exam  Constitutional: He is oriented to person, place, and time. He appears well-developed and well-nourished.  Non-toxic appearance. He does not appear ill. No distress.  HENT:  Head: Normocephalic and atraumatic.  Right Ear: External ear normal.  Left Ear: External ear normal.  Nose: Nose normal. No mucosal edema or rhinorrhea.  Mouth/Throat: Oropharynx is clear and moist and mucous membranes are normal. No dental abscesses or uvula swelling.  Eyes: Conjunctivae and EOM are normal. Pupils are equal, round, and reactive to light.  Neck: Normal range of motion and full passive range of motion without pain. Neck supple.  Cardiovascular: Normal rate, regular rhythm and normal heart sounds.  Exam reveals no gallop and no friction rub.   No murmur heard. Pulmonary/Chest: Effort normal and breath sounds normal. No respiratory distress. He has no wheezes. He has no rhonchi. He has no rales. He exhibits no tenderness and no crepitus.  Abdominal: Soft. Normal appearance and bowel sounds are normal. He exhibits no distension. There is no tenderness. There is no rebound and no guarding.  Pt has blood in his drainage bulb  Musculoskeletal: Normal range of motion. He exhibits no edema or tenderness.  Moves all extremities well.   Neurological: He is alert and oriented to person, place, and time. He has normal strength. No cranial nerve deficit.  Skin: Skin is warm, dry and intact. Rash noted. No erythema. No pallor.  Patient's back is totally red with coalescing raised urticarial lesions, he's noted to have smaller urticarial lesions in the popliteal area of both his knees and of his posterior calves, he has the urticarial lesions in his axilla and across to his abdomen and lower chest.  Psychiatric: He has a normal mood and affect. His speech is normal and behavior is normal. His mood appears not anxious.  Nursing note and vitals reviewed.   ED Course   Procedures (including critical care time)  Medications  diphenhydrAMINE (BENADRYL) 50 MG/ML injection (25 mg  Given 06/26/15 0115)  methylPREDNISolone sodium succinate (SOLU-MEDROL) 125 mg/2 mL injection (125 mg  Given 06/26/15 0115)  famotidine (PEPCID) 20-0.9 MG/50ML-% IVPB (  Stopped 06/26/15 0202)   Patient was rechecked prior to discharge. He states his breathing is fine now. His urticarial rash is improving on his legs however he still has it on the posterior thighs and his abdomen and back. He states however his itching is improved and he wants to be discharged. I discussed with him I was concerned about prescribing prednisone to him when he is discharged. He was given a prescription however I wanted him to speak to his surgeon in the morning to discuss if he should take it or not.   MDM   Final diagnoses:  Allergic reaction, initial encounter  Urticarial rash   New Prescriptions   CETIRIZINE HCL (ZYRTEC ALLERGY) 10 MG CAPS    Take 1 capsule (10 mg total) by mouth daily as needed.   FAMOTIDINE (PEPCID) 20 MG TABLET    Take 1 tablet (20 mg total) by mouth 2 (two) times daily.   PREDNISONE (DELTASONE) 20 MG TABLET    Take 3 po QD x 3d , then 2 po QD x 3d then 1 po QD x 3d    Plan discharge  Rolland Porter, MD, Barbette Or, MD 06/26/15 [phone]

## 2015-07-16 ENCOUNTER — Emergency Department (HOSPITAL_COMMUNITY)
Admission: EM | Admit: 2015-07-16 | Discharge: 2015-07-16 | Disposition: A | Discharge status: Home / Self Care | Payer: Managed Care, Other (non HMO) | Attending: Emergency Medicine | Admitting: Emergency Medicine

## 2015-07-16 ENCOUNTER — Encounter (HOSPITAL_COMMUNITY): Payer: Self-pay | Admitting: *Deleted

## 2015-07-16 DIAGNOSIS — Z973 Presence of spectacles and contact lenses: Secondary | ICD-10-CM | POA: Diagnosis not present

## 2015-07-16 DIAGNOSIS — G43909 Migraine, unspecified, not intractable, without status migrainosus: Secondary | ICD-10-CM | POA: Diagnosis not present

## 2015-07-16 DIAGNOSIS — T7840XA Allergy, unspecified, initial encounter: Secondary | ICD-10-CM | POA: Diagnosis not present

## 2015-07-16 DIAGNOSIS — X58XXXA Exposure to other specified factors, initial encounter: Secondary | ICD-10-CM | POA: Insufficient documentation

## 2015-07-16 DIAGNOSIS — Z85828 Personal history of other malignant neoplasm of skin: Secondary | ICD-10-CM | POA: Insufficient documentation

## 2015-07-16 DIAGNOSIS — Z85038 Personal history of other malignant neoplasm of large intestine: Secondary | ICD-10-CM | POA: Insufficient documentation

## 2015-07-16 DIAGNOSIS — Z87442 Personal history of urinary calculi: Secondary | ICD-10-CM | POA: Insufficient documentation

## 2015-07-16 DIAGNOSIS — Y9289 Other specified places as the place of occurrence of the external cause: Secondary | ICD-10-CM | POA: Insufficient documentation

## 2015-07-16 DIAGNOSIS — Y9389 Activity, other specified: Secondary | ICD-10-CM | POA: Insufficient documentation

## 2015-07-16 DIAGNOSIS — Y998 Other external cause status: Secondary | ICD-10-CM | POA: Diagnosis not present

## 2015-07-16 DIAGNOSIS — Z79899 Other long term (current) drug therapy: Secondary | ICD-10-CM | POA: Diagnosis not present

## 2015-07-16 DIAGNOSIS — K219 Gastro-esophageal reflux disease without esophagitis: Secondary | ICD-10-CM | POA: Diagnosis not present

## 2015-07-16 DIAGNOSIS — Z8739 Personal history of other diseases of the musculoskeletal system and connective tissue: Secondary | ICD-10-CM | POA: Insufficient documentation

## 2015-07-16 LAB — CBC WITH DIFFERENTIAL/PLATELET
BASOS ABS: 0 10*3/uL (ref 0.0–0.1)
Basophils Relative: 0 %
Eosinophils Absolute: 0.5 10*3/uL (ref 0.0–0.7)
Eosinophils Relative: 5 %
HEMATOCRIT: 35.5 % — AB (ref 39.0–52.0)
Hemoglobin: 11.9 g/dL — ABNORMAL LOW (ref 13.0–17.0)
LYMPHS ABS: 3.9 10*3/uL (ref 0.7–4.0)
LYMPHS PCT: 43 %
MCH: 30.3 pg (ref 26.0–34.0)
MCHC: 33.5 g/dL (ref 30.0–36.0)
MCV: 90.3 fL (ref 78.0–100.0)
MONO ABS: 0.8 10*3/uL (ref 0.1–1.0)
Monocytes Relative: 9 %
NEUTROS ABS: 3.9 10*3/uL (ref 1.7–7.7)
Neutrophils Relative %: 43 %
Platelets: 175 10*3/uL (ref 150–400)
RBC: 3.93 MIL/uL — AB (ref 4.22–5.81)
RDW: 13 % (ref 11.5–15.5)
WBC: 9 10*3/uL (ref 4.0–10.5)

## 2015-07-16 LAB — BASIC METABOLIC PANEL
ANION GAP: 7 (ref 5–15)
BUN: 11 mg/dL (ref 6–20)
CO2: 21 mmol/L — AB (ref 22–32)
Calcium: 8.3 mg/dL — ABNORMAL LOW (ref 8.9–10.3)
Chloride: 112 mmol/L — ABNORMAL HIGH (ref 101–111)
Creatinine, Ser: 0.85 mg/dL (ref 0.61–1.24)
GFR calc Af Amer: >60 mL/min (ref >60)
GFR calc non Af Amer: >60 mL/min (ref >60)
GLUCOSE: 111 mg/dL — AB (ref 65–99)
POTASSIUM: 3.6 mmol/L (ref 3.5–5.1)
Sodium: 140 mmol/L (ref 135–145)

## 2015-07-16 MED ORDER — PREDNISONE 50 MG PO TABS
ORAL_TABLET | ORAL | Status: DC
Start: 2015-07-16 — End: 2016-05-01

## 2015-07-16 MED ORDER — FAMOTIDINE IN NACL 20-0.9 MG/50ML-% IV SOLN
20.0000 mg | Freq: Once | INTRAVENOUS | Status: AC
  Administered 2015-07-16: 20 mg via INTRAVENOUS
  Filled 2015-07-16: qty 50

## 2015-07-16 MED ORDER — EPINEPHRINE 0.3 MG/0.3ML IJ SOAJ
0.3000 mg | Freq: Once | INTRAMUSCULAR | Status: AC
  Administered 2015-07-16: 0.3 mg via INTRAMUSCULAR
  Filled 2015-07-16: qty 0.3

## 2015-07-16 MED ORDER — DIPHENHYDRAMINE HCL 50 MG/ML IJ SOLN
25.0000 mg | Freq: Once | INTRAMUSCULAR | Status: DC
  Filled 2015-07-16: qty 1

## 2015-07-16 MED ORDER — DIPHENHYDRAMINE HCL 50 MG/ML IJ SOLN
25.0000 mg | Freq: Once | INTRAMUSCULAR | Status: AC
  Administered 2015-07-16: 25 mg via INTRAVENOUS

## 2015-07-16 MED ORDER — METHYLPREDNISOLONE SODIUM SUCC 125 MG IJ SOLR
125.0000 mg | Freq: Once | INTRAMUSCULAR | Status: AC
  Administered 2015-07-16: 125 mg via INTRAVENOUS
  Filled 2015-07-16: qty 2

## 2015-07-16 MED ORDER — EPINEPHRINE 0.3 MG/0.3ML IJ SOAJ: 0.3000 mg | Freq: Once | INTRAMUSCULAR | Status: AC

## 2015-07-16 MED ORDER — FAMOTIDINE 20 MG PO TABS: 20.0000 mg | ORAL_TABLET | Freq: Two times a day (BID) | ORAL | Status: DC

## 2015-07-16 NOTE — ED Notes (Signed)
Rash and sob first began an hour ago. Also occurred a week ago as well. Pt took Benadryl x 2 just PTA.

## 2015-07-16 NOTE — ED Notes (Signed)
After medication pt reports feeling better, breathing less labored not wheezing noted. No redness present on skin, hives still present on bilateral arms. Pt denies itching at this time. NAD noted.

## 2015-07-16 NOTE — ED Provider Notes (Signed)
CSN: [id number]     Arrival date & time 07/16/15  1014 History  This chart was scribed for Ezequiel Essex, MD by Hilda Lias, ED Scribe. This patient was seen in room [facility] and the patient's care was started at 10:29 AM.   Chief Complaint  Patient presents with  . Allergic Reaction     The history is provided by the patient. No language interpreter was used.   HPI Comments: LUCILLE CRICHLOW is a 69 y.o. male who presents to the Emergency Department complaining of a possible allergic reaction that occurred an hour PTA. Pt states he woke up from sleeping, and had SOB in addition to having a generalized itching rash on his chest, arms, and back. Pt also reports his face and mouth feels swollen. Pt was seen a week ago in ED for the same symptoms. Pt took 2 Benadryl at home before coming to ED. Pt states he was sleeping with no shirt on, and denies knowledge of being bitten or stung by anything. Pt states he had knee surgery a month ago. Pt denies using any new medications or detergents, and denies vomiting.    Past Medical History  Diagnosis Date  . Wears glasses   . Neuropathy 2010  . History of seasonal allergies   . GERD (gastroesophageal reflux disease)   . Kidney stone   . Migraine     "maybe 1-2/month" (06/20/2015)  . DDD (degenerative disc disease), cervical   . Colon cancer 10/20/13    colon resection  . Skin cancer of face     "froze them off"   Past Surgical History  Procedure Laterality Date  . Excisional hemorrhoidectomy    . Carpal tunnel release Left 2011  . Knee arthroscopy Bilateral 2001  . Upper gi endoscopy    . Colonoscopy    . Inguinal hernia repair Left     "@ [facility]"  . Tonsillectomy    . Carpal tunnel release Right 01/07/2013    Procedure: CARPAL TUNNEL RELEASE;  Surgeon: Tennis Must, MD;  Location: [place];  Service: Orthopedics;  Laterality: Right;  . Colonoscopy N/A 10/18/2013    Procedure: COLONOSCOPY;  Surgeon: Danie Binder, MD;  [facility];  Service: Endoscopy;  Laterality: N/A;  2:00-moved to 1030 Pam to notify pt  . Colon resection N/A 10/20/2013    Procedure:  LAPAROSCOPIC hand assisted partial colectomy;  Surgeon: Jamesetta So, MD;  [facility];  Service: General;  Laterality: N/A;  . Appendectomy  10/20/13    (with right hemicolectomy)  . Inguinal hernia repair Bilateral     "[place]"  . Take down of intestinal fistula N/A 02/02/2014    Procedure: TAKE DOWN OF ENTEROCUTANEOUS  FISTULA;  Surgeon: Marcello Moores A. Cornett, MD;  Location: [place];  Service: General;  Laterality: N/A;  . Colonoscopy N/A 09/22/2014    Procedure: COLONOSCOPY;  Surgeon: Rogene Houston, MD;  [facility];  Service: Endoscopy;  Laterality: N/A;  1200  . Incisional hernia repair  06/20/2015    WITH MYOFASCIAL ADVANCEMENT FLAP AND MESH   . Incisional hernia repair N/A 06/20/2015    Procedure: INCISIONAL HERNIA REPAIR WITH MYOFASCIAL ADVANCEMENT FLAP AND MESH;  Surgeon: Erroll Luna, MD;  Location: [place];  Service: General;  Laterality: N/A;  . Insertion of mesh N/A 06/20/2015    Procedure: INSERTION OF MESH;  Surgeon: Erroll Luna, MD;  Location: [place];  Service: General;  Laterality: N/A;  Family History  Problem Relation Age of Onset  . Colon polyps Father   . Colon polyps Paternal Uncle   . Colon polyps Maternal Grandfather    Social History  Substance Use Topics  . Smoking status: Never Smoker   . Smokeless tobacco: Never Used  . Alcohol Use: No    Review of Systems  A complete 10 system review of systems was obtained and all systems are negative except as noted in the HPI and PMH.    Allergies  Niaspan and Tape  Home Medications   Prior to Admission medications   Medication Sig Start Date End Date Taking? Authorizing Provider  Cetirizine HCl (ZYRTEC ALLERGY) 10 MG CAPS Take 1 capsule (10 mg total) by mouth daily as needed. 06/26/15  Yes Rolland Porter, MD  Cyanocobalamin (VITAMIN B-12  IJ) Inject as directed every 30 (thirty) days. Administered Monthly @@ Oncologist/[place]   Yes Historical Provider, MD  fluticasone (FLONASE) 50 MCG/ACT nasal spray Place 2 sprays into both nostrils daily as needed for allergies.  09/28/13  Yes Historical Provider, MD  HYDROcodone-acetaminophen (NORCO/VICODIN) 5-325 MG per tablet Take 1 tablet by mouth every 4 (four) hours as needed. 07/15/15  Yes Historical Provider, MD  LYRICA 75 MG capsule Take 75 mg by mouth daily. For pain 11/22/14  Yes Historical Provider, MD  ondansetron (ZOFRAN-ODT) 4 MG disintegrating tablet Take 1 tablet (4 mg total) by mouth every 6 (six) hours as needed for nausea. 06/24/15  Yes Erroll Luna, MD  polyethylene glycol (MIRALAX / GLYCOLAX) packet Take 17 g by mouth daily. Patient taking differently: Take 17 g by mouth daily as needed for mild constipation.  06/24/15  Yes Erroll Luna, MD  Probiotic Product (PROBIOTIC DAILY PO) Take 1 capsule by mouth daily.    Yes Historical Provider, MD  ranitidine (ZANTAC) 300 MG tablet Take 300 mg by mouth. 11/22/14  Yes Historical Provider, MD  rizatriptan (MAXALT) 10 MG tablet Take 10 mg by mouth as needed for migraine. May repeat in 2 hours if needed   Yes Historical Provider, MD  tamsulosin (FLOMAX) 0.4 MG CAPS capsule Take 1 capsule (0.4 mg total) by mouth daily. 06/24/15  Yes Erroll Luna, MD  VENTOLIN HFA 108 (90 BASE) MCG/ACT inhaler Inhale 1 puff into the lungs every 6 (six) hours as needed for wheezing or shortness of breath.  11/26/13  Yes Historical Provider, MD  EPINEPHrine 0.3 mg/0.3 mL IJ SOAJ injection Inject 0.3 mLs (0.3 mg total) into the muscle once. 07/16/15   Ezequiel Essex, MD  famotidine (PEPCID) 20 MG tablet Take 1 tablet (20 mg total) by mouth 2 (two) times daily. 07/16/15   Ezequiel Essex, MD  HYDROmorphone (DILAUDID) 2 MG tablet Take 1 tablet (2 mg total) by mouth every 2 (two) hours as needed for severe pain. 06/24/15   Erroll Luna, MD  predniSONE (DELTASONE) 50 MG  tablet 1 tablet PO daily 07/16/15   Ezequiel Essex, MD   BP 138/91 mmHg  Pulse 83  Temp(Src) 98.4 F (36.9 C) (Oral)  Resp 21  Ht 6\' 2"  (1.88 m)  Wt 210 lb (95.255 kg)  BMI 26.95 kg/m2  SpO2 100% Physical Exam  Constitutional: He is oriented to person, place, and time. He appears well-developed and well-nourished. No distress.  Uncomfortable   HENT:  Head: Normocephalic and atraumatic.  Mouth/Throat: Oropharynx is clear and moist. No oropharyngeal exudate.  Questionable mild swelling of tongue No assymetry Controlling secretions Speaking full sentences No wheezing   healting midline abd incision  Eyes: Conjunctivae and EOM are normal. Pupils are equal, round, and reactive to light.  Neck: Normal range of motion. Neck supple.  No meningismus.  Cardiovascular: Normal rate, regular rhythm, normal heart sounds and intact distal pulses.   No murmur heard. Pulmonary/Chest: Effort normal and breath sounds normal. No respiratory distress.  Abdominal: Soft. There is no tenderness. There is no rebound and no guarding.  Musculoskeletal: Normal range of motion. He exhibits no edema or tenderness.  Neurological: He is alert and oriented to person, place, and time. No cranial nerve deficit. He exhibits normal muscle tone. Coordination normal.  No ataxia on finger to nose bilaterally. No pronator drift. 5/5 strength throughout. CN 2-12 intact. Negative Romberg. Equal grip strength. Sensation intact. Gait is normal.   Skin: Skin is warm.  Diffuse urticaria to chest, neck, upper arms   Psychiatric: He has a normal mood and affect. His behavior is normal.  Nursing note and vitals reviewed.   ED Course  Procedures (including critical care time) DIAGNOSTIC STUDIES: Oxygen Saturation is 94% on room air, adequate by my interpretation.    COORDINATION OF CARE: 10:32 AM Discussed treatment plan with pt at bedside and pt agreed to plan.   Labs Review Labs Reviewed  CBC WITH  DIFFERENTIAL/PLATELET - Abnormal; Notable for the following:    RBC 3.93 (*)    Hemoglobin 11.9 (*)    HCT 35.5 (*)    All other components within normal limits  BASIC METABOLIC PANEL - Abnormal; Notable for the following:    Chloride 112 (*)    CO2 21 (*)    Glucose, Bld 111 (*)    Calcium 8.3 (*)    All other components within normal limits    Imaging Review No results found. I have personally reviewed and evaluated these images and lab results as part of my medical decision-making.   EKG Interpretation   Date/Time:  Sunday July 16 2015 10:23:04 EDT Ventricular Rate:  71 PR Interval:  195 QRS Duration: 89 QT Interval:  385 QTC Calculation: 418 R Axis:   -53 Text Interpretation:  Sinus rhythm LAD, consider left anterior fascicular  block No significant change was found Confirmed by Wyvonnia Dusky  MD, STEPHEN  ([id number]) on 07/16/2015 10:51:19 AM      MDM   Final diagnoses:  Allergic reaction, initial encounter   rash, shortness of breath, allergic reaction symptoms that onset 1 hour ago upon waking from sleep. Denies any new exposures. Seen 3 weeks ago for similar allergic reaction that was thought to be due to the abdominal binder he was wearing.  Denies any new exposures Diffuse urticaria, mild tongue swelling, lungs are clear. No hypotension. Patient given IM epinephrine, steroids, and the histamines, IV fluids   Recheck 1:30 PM. Patient with no respiratory distress. No tongue or lip swelling. His rash is much improved he still says some residual urticaria to his chest and arms. He is requesting to go home. He's feels better.  Uncertain what he is reacting to. We'll refill epinephrine pen and put on course of steroids and anti histamines.  He states surgeon approved the use of steroids. Return precautions discussed including difficulty breathing, tongue or lip swelling, chest pain or any other concerns.  CRITICAL CARE Performed by: Ezequiel Essex Total critical care  time: 30 Critical care time was exclusive of separately billable procedures and treating other patients. Critical care was necessary to treat or prevent imminent or life-threatening deterioration. Critical care was time spent personally by me on  the following activities: development of treatment plan with patient and/or surrogate as well as nursing, discussions with consultants, evaluation of patient's response to treatment, examination of patient, obtaining history from patient or surrogate, ordering and performing treatments and interventions, ordering and review of laboratory studies, ordering and review of radiographic studies, pulse oximetry and re-evaluation of patient's condition.   Ezequiel Essex, MD 07/16/15 1550

## 2015-07-16 NOTE — ED Notes (Signed)
Pt given coke to drink, no issues noted. Pt ambulated to bathroom as well with no issues noted.

## 2015-07-16 NOTE — Discharge Instructions (Signed)
Anaphylactic Reaction Continue steroids any histamines. Use epinephrine pen as needed for severe allergic reaction. Come to ED if you use your epinephrine pen. An anaphylactic reaction is a sudden, severe allergic reaction that involves the whole body. It can be life threatening. A hospital stay is often required. People with asthma, eczema, or hay fever are slightly more likely to have an anaphylactic reaction. CAUSES  An anaphylactic reaction may be caused by anything to which you are allergic. After being exposed to the allergic substance, your immune system becomes sensitized to it. When you are exposed to that allergic substance again, an allergic reaction can occur. Common causes of an anaphylactic reaction include:  Medicines.  Foods, especially peanuts, wheat, shellfish, milk, and eggs.  Insect bites or stings.  Blood products.  Chemicals, such as dyes, latex, and contrast material used for imaging tests. SYMPTOMS  When an allergic reaction occurs, the body releases histamine and other substances. These substances cause symptoms such as tightening of the airway. Symptoms often develop within seconds or minutes of exposure. Symptoms may include:  Skin rash or hives.  Itching.  Chest tightness.  Swelling of the eyes, tongue, or lips.  Trouble breathing or swallowing.  Lightheadedness or fainting.  Anxiety or confusion.  Stomach pains, vomiting, or diarrhea.  Nasal congestion.  A fast or irregular heartbeat (palpitations). DIAGNOSIS  Diagnosis is based on your history of recent exposure to allergic substances, your symptoms, and a physical exam. Your caregiver may also perform blood or urine tests to confirm the diagnosis. TREATMENT  Epinephrine medicine is the main treatment for an anaphylactic reaction. Other medicines that may be used for treatment include antihistamines, steroids, and albuterol. In severe cases, fluids and medicine to support blood pressure may be  given through an intravenous line (IV). Even if you improve after treatment, you need to be observed to make sure your condition does not get worse. This may require a stay in the hospital. Grant a medical alert bracelet or necklace stating your allergy.  You and your family must learn how to use an anaphylaxis kit or give an epinephrine injection to temporarily treat an emergency allergic reaction. Always carry your epinephrine injection or anaphylaxis kit with you. This can be lifesaving if you have a severe reaction.  Do not drive or perform tasks after treatment until the medicines used to treat your reaction have worn off, or until your caregiver says it is okay.  If you have hives or a rash:  Take medicines as directed by your caregiver.  You may use an over-the-counter antihistamine (diphenhydramine) as needed.  Apply cold compresses to the skin or take baths in cool water. Avoid hot baths or showers. SEEK MEDICAL CARE IF:   You develop symptoms of an allergic reaction to a new substance. Symptoms may start right away or minutes later.  You develop a rash, hives, or itching.  You develop new symptoms. SEEK IMMEDIATE MEDICAL CARE IF:   You have swelling of the mouth, difficulty breathing, or wheezing.  You have a tight feeling in your chest or throat.  You develop hives, swelling, or itching all over your body.  You develop severe vomiting or diarrhea.  You feel faint or pass out. This is an emergency. Use your epinephrine injection or anaphylaxis kit as you have been instructed. Call your local emergency services (911 in [place] Even if you improve after the injection, you need to be examined at a hospital emergency department. MAKE  SURE YOU:   Understand these instructions.  Will watch your condition.  Will get help right away if you are not doing well or get worse. Document Released: 10/07/2005 Document Revised: 10/12/2013 Document Reviewed:  01/08/2012 [facility] Patient Information 2015 [place], [place]. This information is not intended to replace advice given to you by your health care provider. Make sure you discuss any questions you have with your health care provider.

## 2015-07-16 NOTE — ED Notes (Signed)
Pt reports he took 50 mg PO benadryl around 10:00am today

## 2015-08-09 ENCOUNTER — Ambulatory Visit (INDEPENDENT_AMBULATORY_CARE_PROVIDER_SITE_OTHER): Payer: Managed Care, Other (non HMO) | Admitting: Internal Medicine

## 2015-10-01 IMAGING — CT CT ABD-PELV W/ CM
2 of 4 series · 16 of 46 positions shown, 18 images · IV contrast (Omnipaque 300)
Comparison: None

CLINICAL DATA: Lower abdominal pain bilaterally for 2-3 months,
prior inguinal hernia repairs, chronic constipation, history GERD

EXAM:
CT ABDOMEN AND PELVIS WITH CONTRAST
TECHNIQUE: Multidetector CT imaging of the abdomen and pelvis was performed
using the standard protocol following bolus administration of
intravenous contrast. Sagittal and coronal MPR images reconstructed
from axial data set.
CONTRAST:  100mL OMNIPAQUE IOHEXOL 300 MG/ML SOLN. Dilute oral
contrast.

[Series 2: abd_pel_with 5.0 b40f · axial · 0.84mm/px · z∈[-522,-27]mm · 13 of 111 slices shown, 15 images]
[im 6/111  soft-tissue]
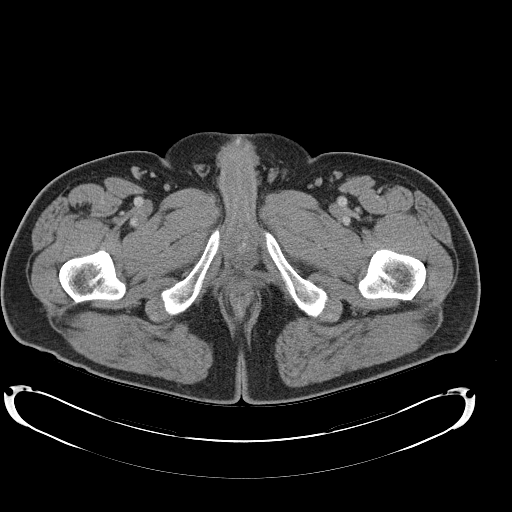
[im 6/111  bone]
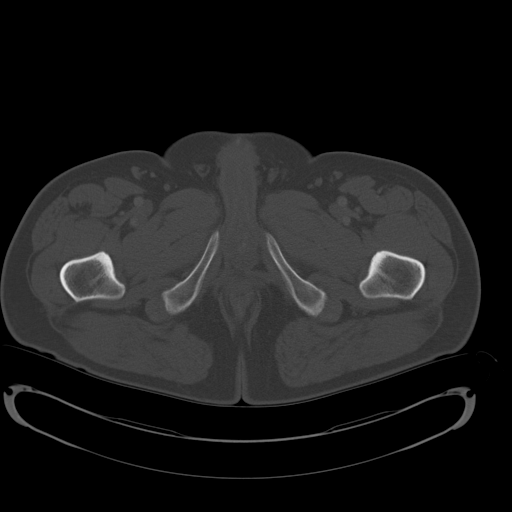
[im 16/111  soft-tissue]
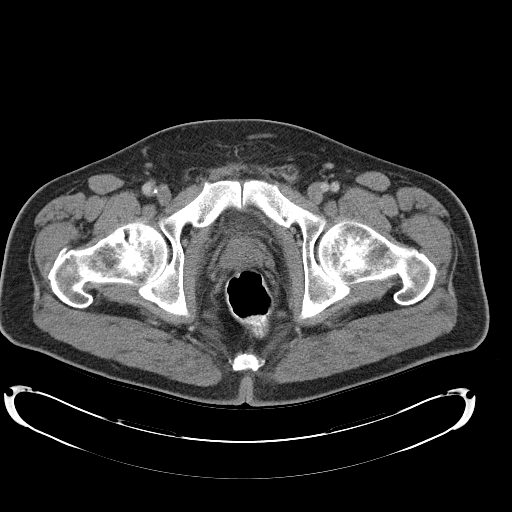
[im 21/111  soft-tissue]
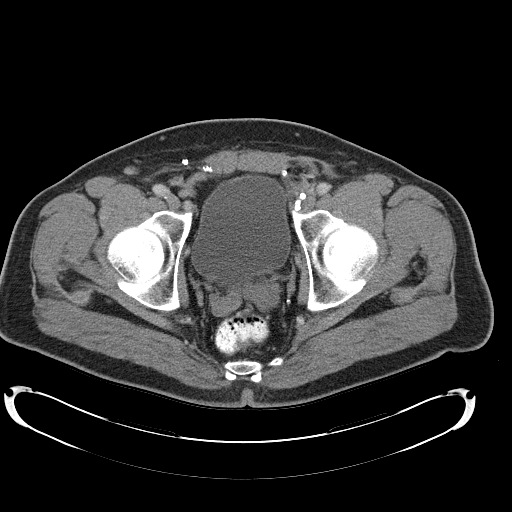
[im 32/111  soft-tissue]
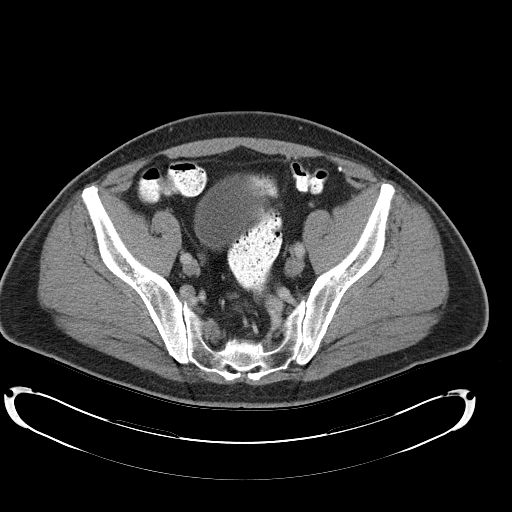
[im 37/111  soft-tissue]
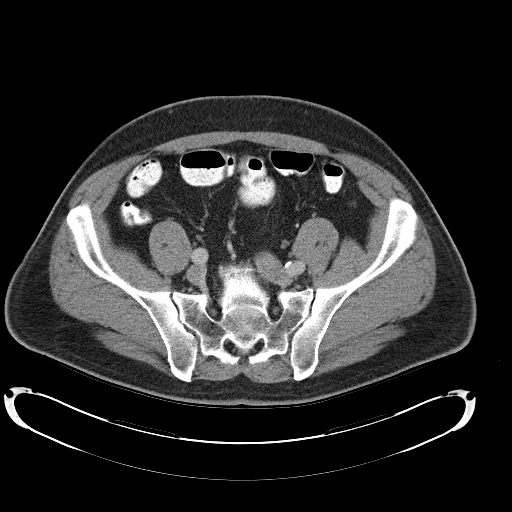
[im 48/111  soft-tissue]
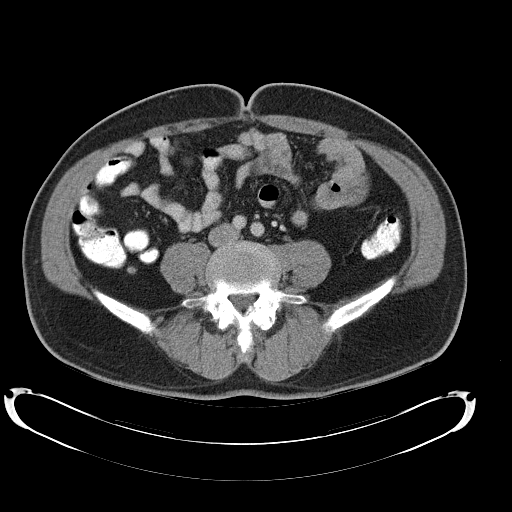
[im 58/111  soft-tissue]
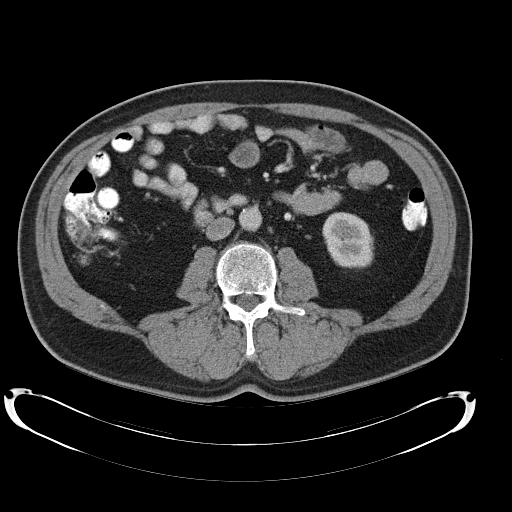
[im 63/111  soft-tissue]
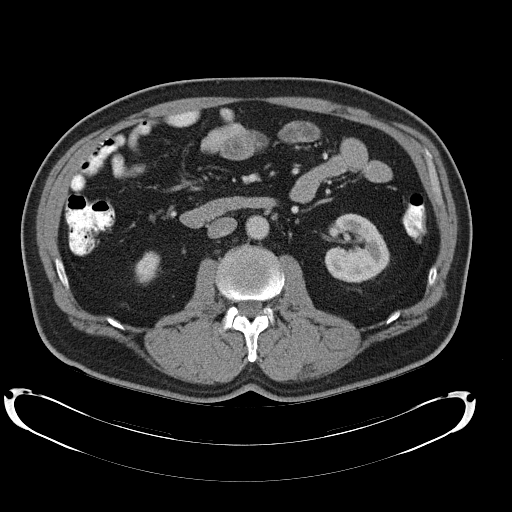
[im 74/111  soft-tissue]
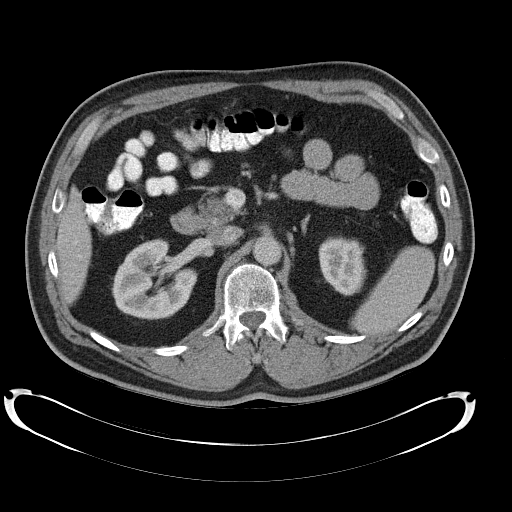
[im 74/111  bone]
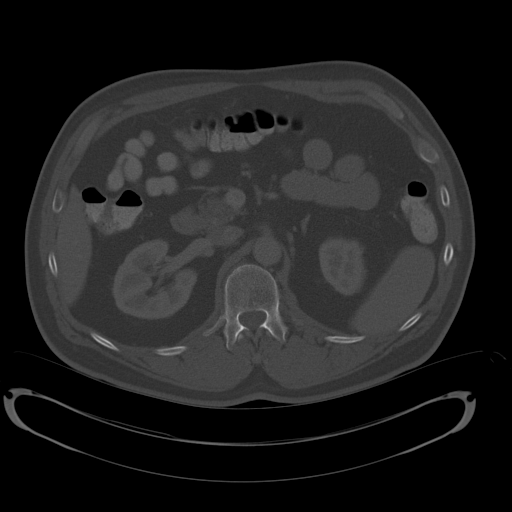
[im 79/111  soft-tissue]
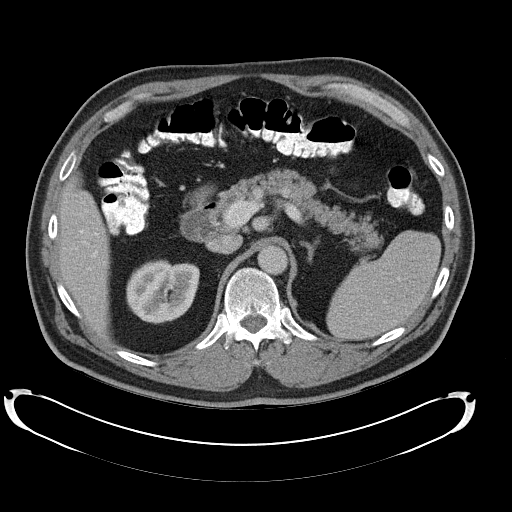
[im 90/111  soft-tissue]
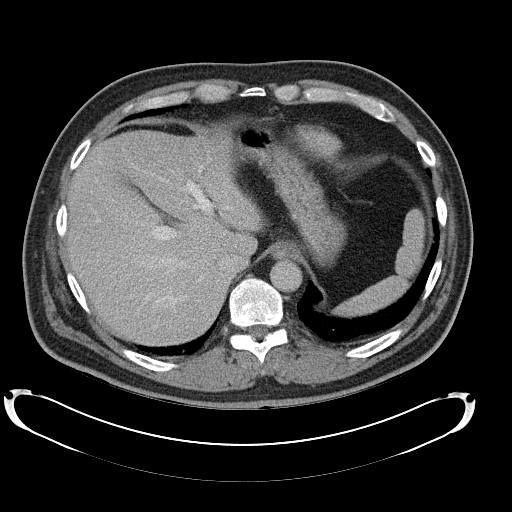
[im 95/111  soft-tissue]
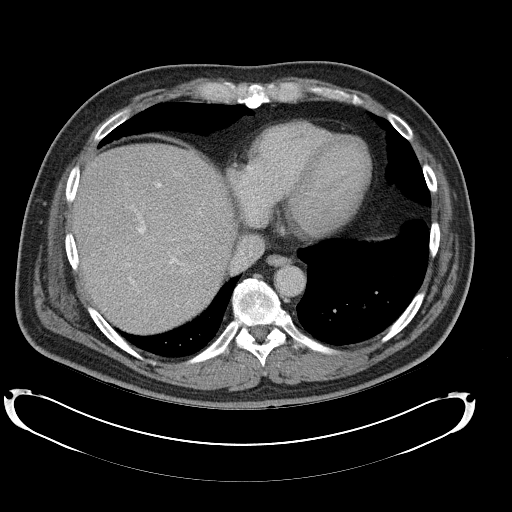
[im 105/111  soft-tissue]
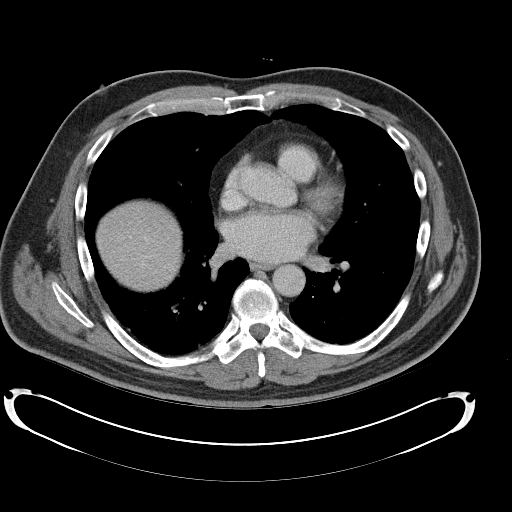

[Series 4: abd_pel_with 3.0 spo cor · coronal · 0.84mm/px · 3 of 87 slices shown]
[im 29/87  soft-tissue]
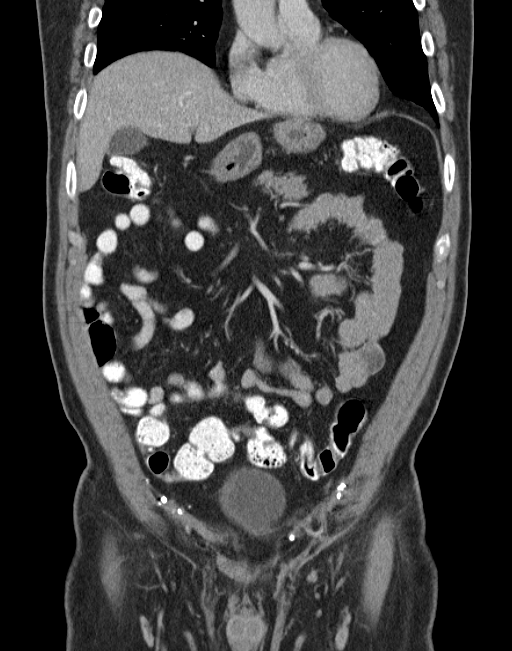
[im 39/87  soft-tissue]
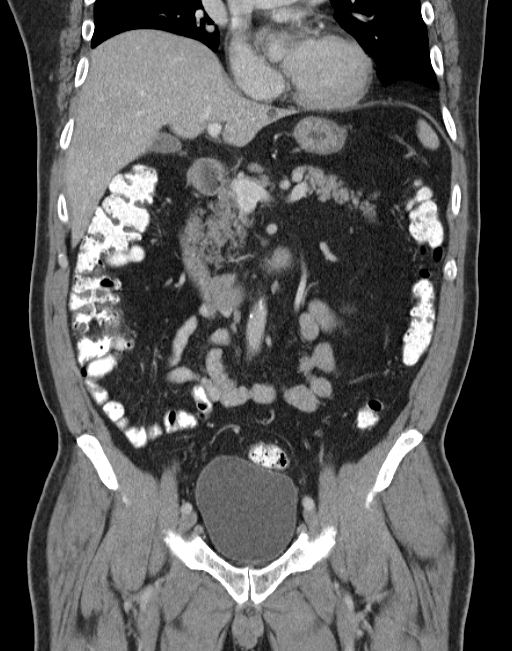
[im 48/87  soft-tissue]
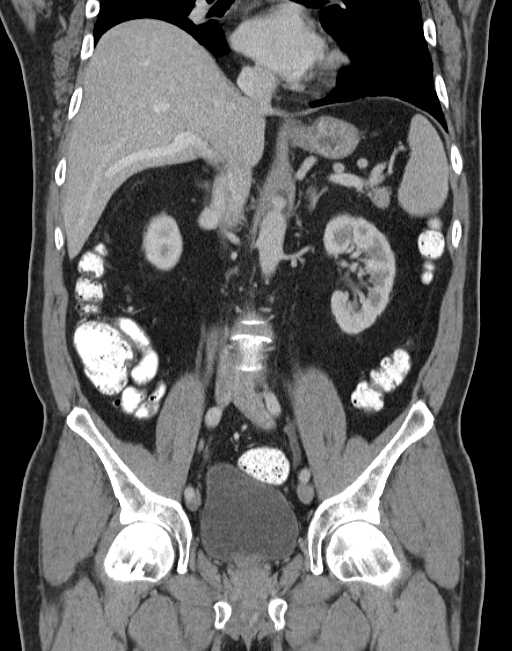

[16 of 46 positions shown; findings below may reference images not displayed]

FINDINGS: Minimal atelectasis at right lower lobe.

Small cyst left lobe liver 13 x 9 mm image 20.

Small cyst upper pole right kidney 15 x 10 mm image 15.

Liver, spleen, pancreas, kidneys, and adrenal glands normal
appearance.

Normal appendix.

Stomach and bowel loops normal appearance.

Unremarkable bladder in ureters.

Minimal scattered atherosclerotic calcification.

Prior bilateral inguinal hernia repairs.

No mass, adenopathy, free fluid, inflammatory process, or recurrent
hernia.

Prior avascular necrosis of the femoral heads with maintenance of
normal femoral head morphology.

No acute osseous findings.

Degenerative disc disease changes L5-S1.
IMPRESSION: No acute intra-abdominal or intrapelvic abnormalities.

Prior avascular necrosis of the femoral heads.

## 2015-10-20 ENCOUNTER — Ambulatory Visit (INDEPENDENT_AMBULATORY_CARE_PROVIDER_SITE_OTHER): Payer: Managed Care, Other (non HMO) | Admitting: Urology

## 2015-10-20 DIAGNOSIS — N3000 Acute cystitis without hematuria: Secondary | ICD-10-CM | POA: Diagnosis not present

## 2015-10-20 DIAGNOSIS — R300 Dysuria: Secondary | ICD-10-CM | POA: Diagnosis not present

## 2015-10-20 DIAGNOSIS — R338 Other retention of urine: Secondary | ICD-10-CM

## 2015-11-10 ENCOUNTER — Ambulatory Visit (INDEPENDENT_AMBULATORY_CARE_PROVIDER_SITE_OTHER): Payer: 59 | Admitting: Urology

## 2015-11-10 DIAGNOSIS — N3000 Acute cystitis without hematuria: Secondary | ICD-10-CM | POA: Diagnosis not present

## 2015-11-10 DIAGNOSIS — R300 Dysuria: Secondary | ICD-10-CM

## 2015-12-04 IMAGING — CR DG CHEST 2V
2 series · 2 of 2 positions shown · non-contrast
Comparison: Chest radiographs [DATE] and earlier.

CLINICAL DATA: 67-year-old male shortness of breath since partial
colectomy 1 month ago. Initial encounter. Colon cancer.
Enterocutaneous fistula.

EXAM:
CHEST  2 VIEW

[view not recorded (1 of 2)]
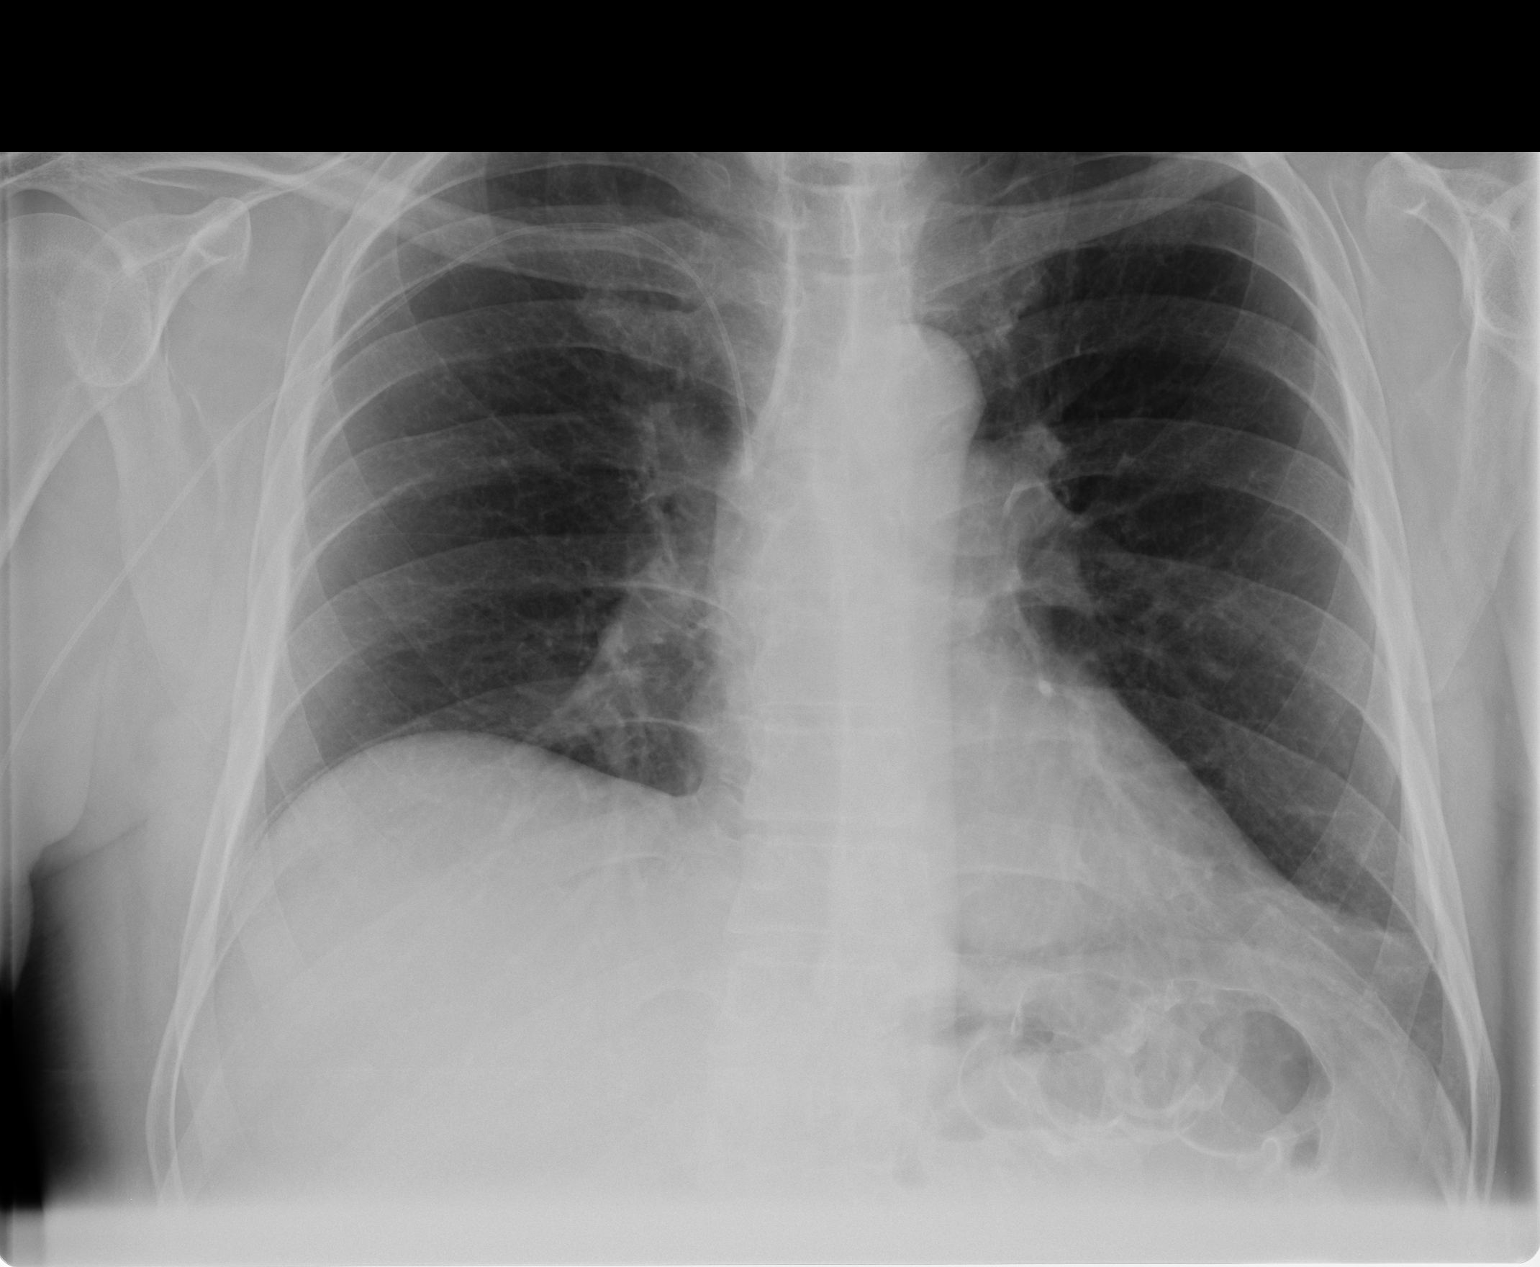

[view not recorded (2 of 2)]
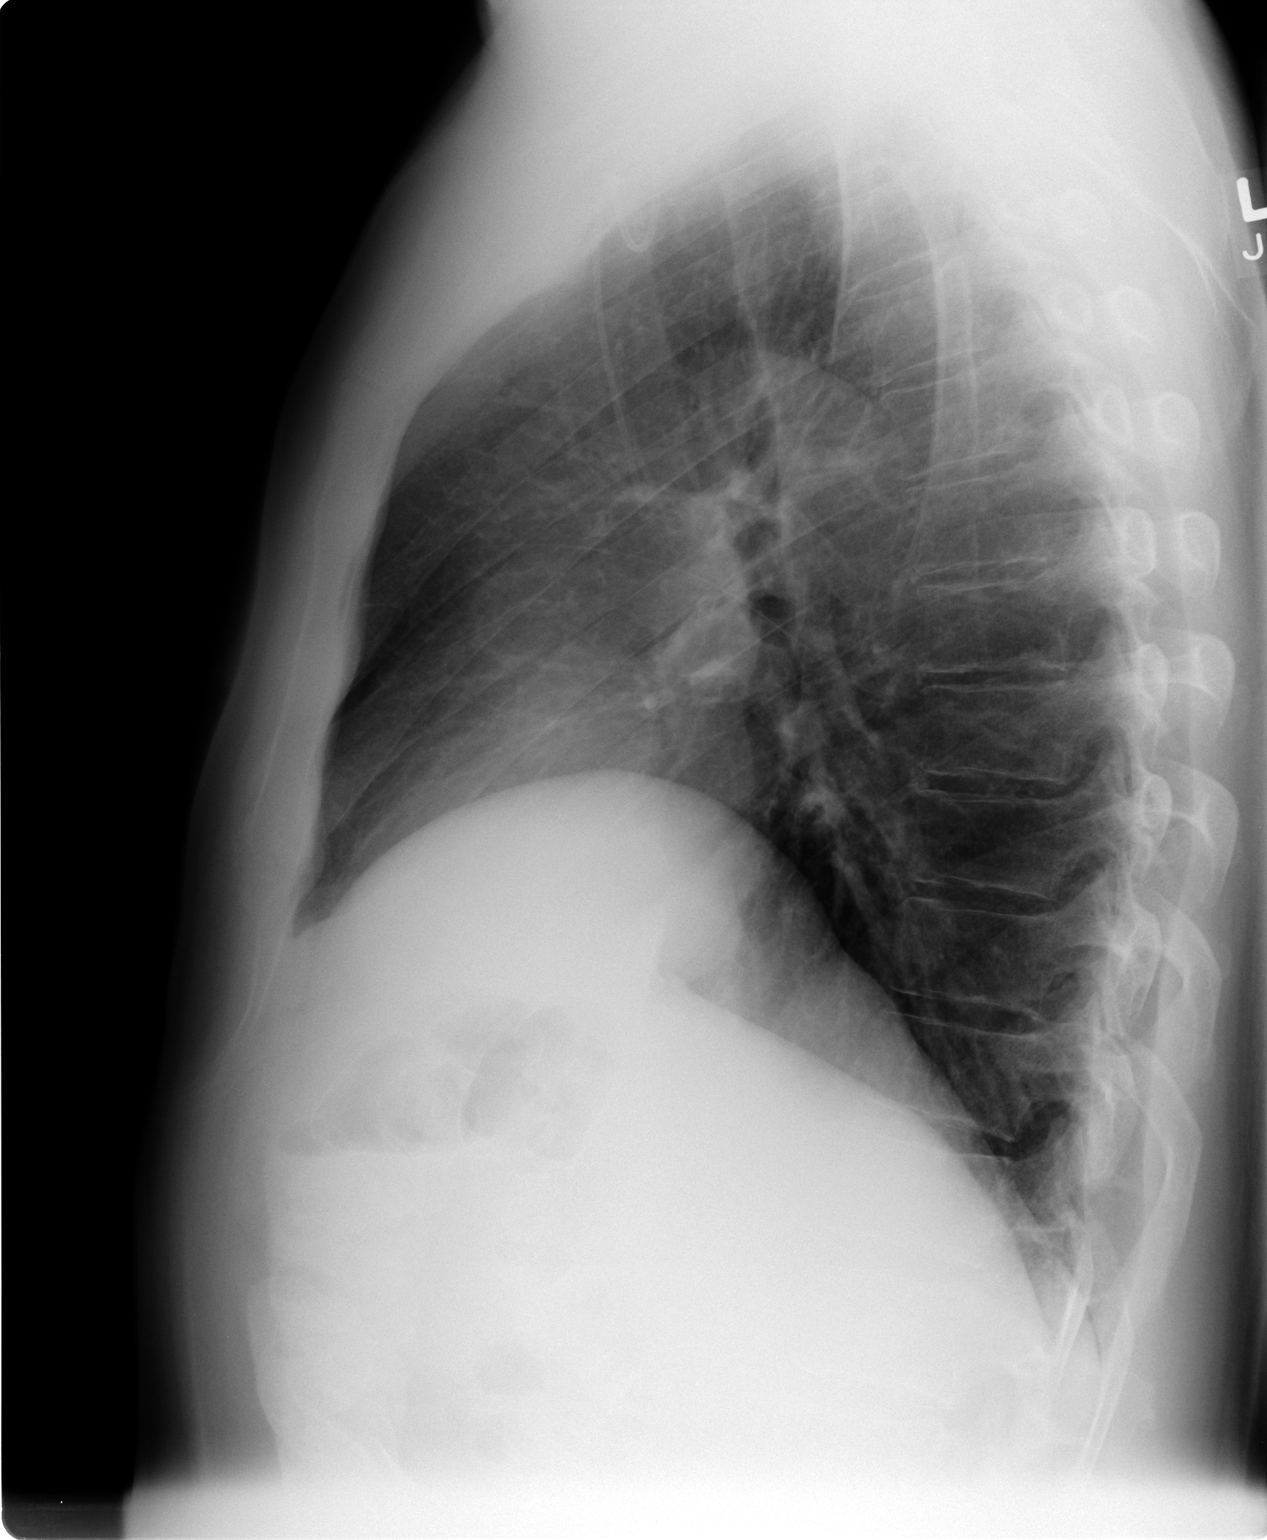

[2 of 2 positions shown; findings below may reference images not displayed]

FINDINGS: Right PICC line now in place. Stable lung volumes. Stable mild to
moderate elevation of the right hemidiaphragm. Normal cardiac size
and mediastinal contours. Small calcified left hilar nodes suspected
and unchanged. No pneumothorax or pulmonary edema. No pleural
effusion or acute pulmonary opacity. Stable visualized osseous
structures. Small volume of oral contrast in nondilated colon in the
upper abdomen.
IMPRESSION: No acute or metastatic cardiopulmonary abnormality.

## 2015-12-19 DIAGNOSIS — L509 Urticaria, unspecified: Secondary | ICD-10-CM | POA: Insufficient documentation

## 2015-12-22 ENCOUNTER — Ambulatory Visit: Payer: Medicare Other | Admitting: Urology

## 2016-05-01 ENCOUNTER — Other Ambulatory Visit (INDEPENDENT_AMBULATORY_CARE_PROVIDER_SITE_OTHER): Payer: Self-pay | Admitting: Internal Medicine

## 2016-05-01 ENCOUNTER — Encounter (INDEPENDENT_AMBULATORY_CARE_PROVIDER_SITE_OTHER): Payer: Self-pay | Admitting: *Deleted

## 2016-05-01 ENCOUNTER — Encounter (INDEPENDENT_AMBULATORY_CARE_PROVIDER_SITE_OTHER): Payer: Self-pay | Admitting: Internal Medicine

## 2016-05-01 ENCOUNTER — Ambulatory Visit (INDEPENDENT_AMBULATORY_CARE_PROVIDER_SITE_OTHER): Payer: BLUE CROSS/BLUE SHIELD | Admitting: Internal Medicine

## 2016-05-01 VITALS — BP 130/90 | HR 81 | Temp 98.3°F | Resp 20 | Ht 74.0 in | Wt 217.0 lb

## 2016-05-01 DIAGNOSIS — R1013 Epigastric pain: Secondary | ICD-10-CM

## 2016-05-01 DIAGNOSIS — G8929 Other chronic pain: Secondary | ICD-10-CM

## 2016-05-01 NOTE — Progress Notes (Signed)
Subjective:    Patient ID: Eduardo [name], male    DOB: 10/06/1946, 70 y.o.   MRN: [id number]  Abdominal Pain  Presents today with c/o of epigastric pain x 2 1/2 weeks ago. Nausea and weakness associated with his symptoms.  He says he does not want to eat now.  He is afraid food will hurt his epigastric region. His main symptoms are nausea and the pain. He denies similar symptoms. He says the pain is constant.  No NSAIDs.  He thinks he has lost about 8 pounds over the past 2 1/2 weeks. No change in his stools. No melena or BRRB. He stools are always loose and soft.   Hx of colon cancer. He status post right hemicolectomy in December 2014. Surgery was complicated by anastomotic leak requiring antibiotics with percutaneous drainage. Subsequently he required takedown of enterocutaneous fistula(April 2015)   09/22/2014 Colonoscopy Indications: Patient is 70 year old Caucasian male who is here for surveillance colonoscopy. He is status post right hemicolectomy in December 2014 for ascending colon carcinoma arising out of sessile serrated polyp. He has intermittent diarrhea but denies abdominal pain or rectal bleeding.  Impression:  Two small diverticula at transverse colon otherwise normal colonoscopy with wide-open ileocolonic anastomosis.      Review of Systems  Gastrointestinal: Positive for abdominal pain.   Past Medical History  Diagnosis Date  . Wears glasses   . Neuropathy ([place]) 2010  . History of seasonal allergies   . GERD (gastroesophageal reflux disease)   . Kidney stone   . Migraine     "maybe 1-2/month" (06/20/2015)  . DDD (degenerative disc disease), cervical   . Colon cancer ([place]) 10/20/13    colon resection  . Skin cancer of face     "froze them off"    Past Surgical History  Procedure Laterality Date  . Excisional hemorrhoidectomy    . Carpal tunnel release Left 2011  . Knee arthroscopy Bilateral 2001  . Upper gi endoscopy    . Colonoscopy    .  Inguinal hernia repair Left     "@ [facility]"  . Tonsillectomy    . Carpal tunnel release Right 01/07/2013    Procedure: CARPAL TUNNEL RELEASE;  Surgeon: Tennis Must, MD;  Location: [place];  Service: Orthopedics;  Laterality: Right;  . Colonoscopy N/A 10/18/2013    Procedure: COLONOSCOPY;  Surgeon: Danie Binder, MD;  [facility];  Service: Endoscopy;  Laterality: N/A;  2:00-moved to 1030 Pam to notify pt  . Colon resection N/A 10/20/2013    Procedure:  LAPAROSCOPIC hand assisted partial colectomy;  Surgeon: Jamesetta So, MD;  Location: [facility];  Service: General;  Laterality: N/A;  . Appendectomy  10/20/13    (with right hemicolectomy)  . Inguinal hernia repair Bilateral     "[place] [facility]"  . Take down of intestinal fistula N/A 02/02/2014    Procedure: TAKE DOWN OF ENTEROCUTANEOUS  FISTULA;  Surgeon: Marcello Moores A. Cornett, MD;  Location: [place];  Service: General;  Laterality: N/A;  . Colonoscopy N/A 09/22/2014    Procedure: COLONOSCOPY;  Surgeon: Rogene Houston, MD;  [facility];  Service: Endoscopy;  Laterality: N/A;  1200  . Incisional hernia repair  06/20/2015    WITH MYOFASCIAL ADVANCEMENT FLAP AND MESH   . Incisional hernia repair N/A 06/20/2015    Procedure: INCISIONAL HERNIA REPAIR WITH MYOFASCIAL ADVANCEMENT FLAP AND MESH;  Surgeon: Erroll Luna, MD;  Location: [place];  Service: General;  Laterality: N/A;  . Insertion of mesh N/A 06/20/2015    Procedure: INSERTION OF MESH;  Surgeon: Erroll Luna, MD;  Location: [place];  Service: General;  Laterality: N/A;    Allergies  Allergen Reactions  . Niaspan [Niacin Er] Rash  . Tape Rash    Paper tape OK    Current Outpatient Prescriptions on File Prior to Visit  Medication Sig Dispense Refill  . Cetirizine HCl (ZYRTEC ALLERGY) 10 MG CAPS Take 1 capsule (10 mg total) by mouth daily as needed. 10 capsule 0  . Cyanocobalamin (VITAMIN B-12 IJ) Inject as directed every 30 (thirty) days. Administered  Monthly @@ Oncologist/[place]    . EPINEPHrine 0.3 mg/0.3 mL IJ SOAJ injection Inject 0.3 mLs (0.3 mg total) into the muscle once. 1 Device 0  . famotidine (PEPCID) 20 MG tablet Take 1 tablet (20 mg total) by mouth 2 (two) times daily. 30 tablet 0  . fluticasone (FLONASE) 50 MCG/ACT nasal spray Place 2 sprays into both nostrils daily as needed for allergies.     Marland Kitchen HYDROcodone-acetaminophen (NORCO/VICODIN) 5-325 MG per tablet Take 1 tablet by mouth every 4 (four) hours as needed.  0  . LYRICA 75 MG capsule Take 75 mg by mouth daily. For pain    . ondansetron (ZOFRAN-ODT) 4 MG disintegrating tablet Take 1 tablet (4 mg total) by mouth every 6 (six) hours as needed for nausea. 20 tablet 0  . polyethylene glycol (MIRALAX / GLYCOLAX) packet Take 17 g by mouth daily. (Patient taking differently: Take 17 g by mouth daily as needed for mild constipation. ) 14 each 0  . Probiotic Product (PROBIOTIC DAILY PO) Take 1 capsule by mouth daily.     . ranitidine (ZANTAC) 300 MG tablet Take 300 mg by mouth.  1  . rizatriptan (MAXALT) 10 MG tablet Take 10 mg by mouth as needed for migraine. May repeat in 2 hours if needed    . VENTOLIN HFA 108 (90 BASE) MCG/ACT inhaler Inhale 1 puff into the lungs every 6 (six) hours as needed for wheezing or shortness of breath.      No current facility-administered medications on file prior to visit.         Objective:   Physical ExamBlood pressure 130/90, pulse 81, temperature 98.3 F (36.8 C), resp. rate 20, height 6\' 2"  (1.88 m), weight 217 lb (98.431 kg), SpO2 98 %. Alert and oriented. Skin warm and dry. Oral mucosa is moist.   . Sclera anicteric, conjunctivae is pink. Thyroid not enlarged. No cervical lymphadenopathy. Lungs clear. Heart regular rate and rhythm.  Abdomen is soft. Bowel sounds are positive. No hepatomegaly. No abdominal masses felt. Tenderness to epigastric region.  No edema to lower extremities.          Assessment & Plan:  Epigastric pain. PUD needs to  be ruled out.  EGD. Patient would like to proceed with an EGD.

## 2016-05-01 NOTE — Patient Instructions (Signed)
EGd. The risks and benefits such as perforation, bleeding, and infection were reviewed with the patient and is agreeable.

## 2016-06-19 ENCOUNTER — Encounter (HOSPITAL_COMMUNITY)
Admission: RE | Disposition: A | Discharge status: Home / Self Care | Payer: Self-pay | Source: Ambulatory Visit | Attending: Internal Medicine

## 2016-06-19 ENCOUNTER — Encounter (HOSPITAL_COMMUNITY): Payer: Self-pay | Admitting: *Deleted

## 2016-06-19 ENCOUNTER — Ambulatory Visit (HOSPITAL_COMMUNITY)
Admission: RE | Admit: 2016-06-19 | Discharge: 2016-06-19 | Disposition: A | Discharge status: Home / Self Care | Payer: BLUE CROSS/BLUE SHIELD | Source: Ambulatory Visit | Attending: Internal Medicine | Admitting: Internal Medicine

## 2016-06-19 DIAGNOSIS — Z79899 Other long term (current) drug therapy: Secondary | ICD-10-CM | POA: Insufficient documentation

## 2016-06-19 DIAGNOSIS — Z9049 Acquired absence of other specified parts of digestive tract: Secondary | ICD-10-CM | POA: Insufficient documentation

## 2016-06-19 DIAGNOSIS — Z8371 Family history of colonic polyps: Secondary | ICD-10-CM | POA: Insufficient documentation

## 2016-06-19 DIAGNOSIS — K2970 Gastritis, unspecified, without bleeding: Secondary | ICD-10-CM | POA: Diagnosis not present

## 2016-06-19 DIAGNOSIS — R1013 Epigastric pain: Secondary | ICD-10-CM

## 2016-06-19 DIAGNOSIS — G43909 Migraine, unspecified, not intractable, without status migrainosus: Secondary | ICD-10-CM | POA: Insufficient documentation

## 2016-06-19 DIAGNOSIS — K2950 Unspecified chronic gastritis without bleeding: Secondary | ICD-10-CM | POA: Insufficient documentation

## 2016-06-19 DIAGNOSIS — K449 Diaphragmatic hernia without obstruction or gangrene: Secondary | ICD-10-CM | POA: Insufficient documentation

## 2016-06-19 DIAGNOSIS — K210 Gastro-esophageal reflux disease with esophagitis: Secondary | ICD-10-CM

## 2016-06-19 DIAGNOSIS — Z85038 Personal history of other malignant neoplasm of large intestine: Secondary | ICD-10-CM | POA: Diagnosis not present

## 2016-06-19 HISTORY — PX: ESOPHAGOGASTRODUODENOSCOPY: SHX5428

## 2016-06-19 SURGERY — EGD (ESOPHAGOGASTRODUODENOSCOPY)
Anesthesia: Moderate Sedation

## 2016-06-19 MED ORDER — MEPERIDINE HCL 50 MG/ML IJ SOLN
INTRAMUSCULAR | Status: DC | PRN
  Administered 2016-06-19 (×2): 25 mg via INTRAVENOUS

## 2016-06-19 MED ORDER — SODIUM CHLORIDE 0.9 % IV SOLN
INTRAVENOUS | Status: DC
  Administered 2016-06-19: 14:00:00 via INTRAVENOUS

## 2016-06-19 MED ORDER — MEPERIDINE HCL 50 MG/ML IJ SOLN
INTRAMUSCULAR | Status: AC
  Filled 2016-06-19: qty 1

## 2016-06-19 MED ORDER — MIDAZOLAM HCL 5 MG/5ML IJ SOLN
INTRAMUSCULAR | Status: AC
  Filled 2016-06-19: qty 10

## 2016-06-19 MED ORDER — SIMETHICONE 40 MG/0.6ML PO SUSP
ORAL | Status: DC | PRN
  Administered 2016-06-19: 2.5 mL

## 2016-06-19 MED ORDER — BUTAMBEN-TETRACAINE-BENZOCAINE 2-2-14 % EX AERO
INHALATION_SPRAY | CUTANEOUS | Status: DC | PRN
  Administered 2016-06-19: 2 via TOPICAL

## 2016-06-19 MED ORDER — PANTOPRAZOLE SODIUM 40 MG PO TBEC: 40.0000 mg | DELAYED_RELEASE_TABLET | Freq: Every day | ORAL | 5 refills | Status: DC

## 2016-06-19 MED ORDER — MIDAZOLAM HCL 5 MG/5ML IJ SOLN
INTRAMUSCULAR | Status: DC | PRN
  Administered 2016-06-19: 1 mg via INTRAVENOUS
  Administered 2016-06-19 (×3): 2 mg via INTRAVENOUS

## 2016-06-19 NOTE — H&P (Signed)
Eduardo [name] is an 70 y.o. male.   Chief Complaint: Patient is here for esophagogastroduodenoscopy. HPI: Patient is 70 year old Caucasian male who presents with 6 month history of intermittent epigastric pain. He has had more pain over the last few weeks. Pain seems to occur with spicy foods. He also has had no external pain usually 2 AM. He denies nausea vomiting anorexia weight loss or melena. He has intermittent heartburn for which uses Zantac or Pepcid on demand. He does not take OTC NSAIDs. He has history of CRC status post right hemicolectomy in December 2014 and remains in remission. Last colonoscopy was in December 2015.  Past Medical History:  Diagnosis Date  . Colon cancer ([place]) 10/20/13   colon resection  . DDD (degenerative disc disease), cervical   . GERD (gastroesophageal reflux disease)   . History of seasonal allergies   . Kidney stone   . Migraine    "maybe 1-2/month" (06/20/2015)  . Neuropathy ([place]) 2010  . Skin cancer of face    "froze them off"  . Wears glasses     Past Surgical History:  Procedure Laterality Date  . APPENDECTOMY  10/20/13   (with right hemicolectomy)  . CARPAL TUNNEL RELEASE Left 2011  . CARPAL TUNNEL RELEASE Right 01/07/2013   Procedure: CARPAL TUNNEL RELEASE;  Surgeon: Tennis Must, MD;  Location: [place];  Service: Orthopedics;  Laterality: Right;  . COLON RESECTION N/A 10/20/2013   Procedure:  LAPAROSCOPIC hand assisted partial colectomy;  Surgeon: Jamesetta So, MD;  [facility];  Service: General;  Laterality: N/A;  . COLONOSCOPY    . COLONOSCOPY N/A 10/18/2013   Procedure: COLONOSCOPY;  Surgeon: Danie Binder, MD;  [facility];  Service: Endoscopy;  Laterality: N/A;  2:00-moved to 1030 Pam to notify pt  . COLONOSCOPY N/A 09/22/2014   Procedure: COLONOSCOPY;  Surgeon: Rogene Houston, MD;  [facility];  Service: Endoscopy;  Laterality: N/A;  1200  . EXCISIONAL HEMORRHOIDECTOMY    .  INCISIONAL HERNIA REPAIR  06/20/2015   WITH MYOFASCIAL ADVANCEMENT FLAP AND MESH   . INCISIONAL HERNIA REPAIR N/A 06/20/2015   Procedure: INCISIONAL HERNIA REPAIR WITH MYOFASCIAL ADVANCEMENT FLAP AND MESH;  Surgeon: Erroll Luna, MD;  Location: [place];  Service: General;  Laterality: N/A;  . INGUINAL HERNIA REPAIR Left    "@ [facility]"  . INGUINAL HERNIA REPAIR Bilateral    "[facility] [facility]"  . INSERTION OF MESH N/A 06/20/2015   Procedure: INSERTION OF MESH;  Surgeon: Erroll Luna, MD;  Location: [place];  Service: General;  Laterality: N/A;  . KNEE ARTHROSCOPY Bilateral 2001  . TAKE DOWN OF INTESTINAL FISTULA N/A 02/02/2014   Procedure: TAKE DOWN OF ENTEROCUTANEOUS  FISTULA;  Surgeon: Marcello Moores A. Cornett, MD;  Location: [place];  Service: General;  Laterality: N/A;  . TONSILLECTOMY    . UPPER GI ENDOSCOPY      Family History  Problem Relation Age of Onset  . Colon polyps Father   . Colon polyps Paternal Uncle   . Colon polyps Maternal Grandfather    Social History:  reports that he has never smoked. He has never used smokeless tobacco. He reports that he does not drink alcohol or use drugs.  Allergies:  Allergies  Allergen Reactions  . Niaspan [Niacin Er] Rash  . Tape Rash    Paper tape OK    Medications Prior to Admission  Medication Sig Dispense Refill  . Cetirizine HCl (ZYRTEC ALLERGY)  10 MG CAPS Take 1 capsule (10 mg total) by mouth daily as needed. 10 capsule 0  . Cyanocobalamin (VITAMIN B-12 IJ) Inject as directed every 30 (thirty) days. Administered Monthly @@ Oncologist/[place]    . fluticasone (FLONASE) 50 MCG/ACT nasal spray Place 2 sprays into both nostrils daily as needed for allergies.     Marland Kitchen HYDROcodone-acetaminophen (NORCO/VICODIN) 5-325 MG per tablet Take 1 tablet by mouth every 4 (four) hours as needed.  0  . LYRICA 75 MG capsule Take 75 mg by mouth daily. For pain    . Probiotic Product (PROBIOTIC DAILY PO) Take 1 capsule by mouth daily.     . ranitidine (ZANTAC) 300 MG  tablet Take 300 mg by mouth.  1  . rizatriptan (MAXALT) 10 MG tablet Take 10 mg by mouth as needed for migraine. May repeat in 2 hours if needed    . EPINEPHrine 0.3 mg/0.3 mL IJ SOAJ injection Inject 0.3 mLs (0.3 mg total) into the muscle once. 1 Device 0  . famotidine (PEPCID) 20 MG tablet Take 1 tablet (20 mg total) by mouth 2 (two) times daily. 30 tablet 0  . ondansetron (ZOFRAN-ODT) 4 MG disintegrating tablet Take 1 tablet (4 mg total) by mouth every 6 (six) hours as needed for nausea. 20 tablet 0  . polyethylene glycol (MIRALAX / GLYCOLAX) packet Take 17 g by mouth daily. (Patient taking differently: Take 17 g by mouth daily as needed for mild constipation. ) 14 each 0  . VENTOLIN HFA 108 (90 BASE) MCG/ACT inhaler Inhale 1 puff into the lungs every 6 (six) hours as needed for wheezing or shortness of breath.       No results found for this or any previous visit (from the past 48 hour(s)). No results found.  ROS  Blood pressure (!) 144/96, pulse 65, temperature 98.5 F (36.9 C), temperature source Oral, resp. rate 16, height 6\' 2"  (1.88 m), weight 210 lb (95.3 kg), SpO2 98 %. Physical Exam  Constitutional: He appears well-developed and well-nourished.  HENT:  Mouth/Throat: Oropharynx is clear and moist.  Eyes: Conjunctivae are normal. No scleral icterus.  Neck: No thyromegaly present.  Cardiovascular: Normal rate, regular rhythm and normal heart sounds.   No murmur heard. Respiratory: Effort normal and breath sounds normal.  GI:  Abdomen is symmetrical with the upper midline scar. On palpation is soft with mild midepigastric tenderness. No organomegaly or masses.  Musculoskeletal: He exhibits no edema.  Lymphadenopathy:    He has no cervical adenopathy.  Neurological: He is alert.  Skin: Skin is warm and dry.     Assessment/Plan Epigastric pain. Diagnostic EGD.  Hildred Laser, MD 06/19/2016, 2:39 PM

## 2016-06-19 NOTE — Op Note (Signed)
[facility] Patient Name: Eduardo [name] Procedure Date: 06/19/2016 2:35 PM MRN: [id number] Date of Birth: 07-09-1946 Attending MD: Hildred Laser , MD CSN: [id number] Age: 70 Admit Type: Outpatient Procedure:                Upper GI endoscopy Indications:              Epigastric abdominal painof 6 months duration,                            worse lately. Providers:                Hildred Laser, MD, Lurline Del, RN, Isabella Stalling,                            Technician Referring MD:             Redmond [employer], MD Medicines:                Cetacaine spray, Meperidine 50 mg IV, Midazolam 7                            mg IV Complications:            No immediate complications. Estimated Blood Loss:     Estimated blood loss was minimal. Procedure:                Pre-Anesthesia Assessment:                           - Prior to the procedure, a History and Physical                            was performed, and patient medications and                            allergies were reviewed. The patient's tolerance of                            previous anesthesia was also reviewed. The risks                            and benefits of the procedure and the sedation                            options and risks were discussed with the patient.                            All questions were answered, and informed consent                            was obtained. Prior Anticoagulants: The patient has                            taken no previous anticoagulant or antiplatelet  agents. ASA Grade Assessment: III - A patient with                            severe systemic disease. After reviewing the risks                            and benefits, the patient was deemed in                            satisfactory condition to undergo the procedure.                           After obtaining informed consent, the endoscope was                            passed under direct vision.  Throughout the                            procedure, the patient's blood pressure, pulse, and                            oxygen saturations were monitored continuously. The                            [id number]) scope was introduced through the                            mouth, and advanced to the second part of duodenum.                            The upper GI endoscopy was accomplished without                            difficulty. The patient tolerated the procedure                            well. Scope In: 2:51:22 PM Scope Out: 2:59:10 PM Total Procedure Duration: 0 hours 7 minutes 48 seconds  Findings:      The examined esophagus was normal.      Non-severe esophagitis with no bleeding was found 40 cm from the       incisors.      A 2 cm hiatal hernia was present.      Patchy mild inflammation characterized by congestion (edema), erythema       and granularity was found in the gastric antrum. Biopsies were taken       with a cold forceps for histology.      The exam of the stomach was otherwise normal.      The duodenal bulb and second portion of the duodenum were normal. Impression:               - Normal esophagus.                           - Non-severe reflux esophagitis limited to GE  junction.                           - 2 cm hiatal hernia.                           - Gastritis. Biopsied.                           - Normal duodenal bulb and second portion of the                            duodenum. Moderate Sedation:      Moderate (conscious) sedation was administered by the endoscopy nurse       and supervised by the endoscopist. The following parameters were       monitored: oxygen saturation, heart rate, blood pressure, CO2       capnography and response to care. Total physician intraservice time was       17 minutes. Recommendation:           - Patient has a contact number available for                            emergencies. The  signs and symptoms of potential                            delayed complications were discussed with the                            patient. Return to normal activities tomorrow.                            Written discharge instructions were provided to the                            patient.                           - Resume previous diet today.                           - Continue present medications.                           - Discontinue H2 blocker.                           - Use Protonix (pantoprazole) 40 mg PO 30 minutes                            before breakfast daily.                           - if gastric biopsies negative for H. pylori will                            proceed with upper abdominal US. Procedure Code(s):        ---  Professional ---                           [phone], Esophagogastroduodenoscopy, flexible,                            transoral; with biopsy, single or multiple                           99152, Moderate sedation services provided by the                            same physician or other qualified health care                            professional performing the diagnostic or                            therapeutic service that the sedation supports,                            requiring the presence of an independent trained                            observer to assist in the monitoring of the                            patient's level of consciousness and physiological                            status; initial 15 minutes of intraservice time,                            patient age 31 years or older Diagnosis Code(s):        --- Professional ---                           K21.0, Gastro-esophageal reflux disease with                            esophagitis                           K44.9, Diaphragmatic hernia without obstruction or                            gangrene                           K29.70, Gastritis, unspecified, without bleeding                            R10.13, Epigastric pain CPT copyright 2016 American Medical Association. All rights reserved. The codes documented in this report are preliminary and upon coder review may  be revised to meet current compliance requirements. Hildred Laser, MD Hildred Laser, MD 06/19/2016 3:10:55 PM This report has been  signed electronically. Number of Addenda: 0

## 2016-06-19 NOTE — Discharge Instructions (Signed)
Discontinue famotidine and or ranitidine. Pantoprazole 40 mg by mouth 30 minutes before breakfast daily. Resume other medications and diet as before. No driving for 24 hours. Physician will call with biopsy results.  Esophagogastroduodenoscopy, Care After Refer to this sheet in the next few weeks. These instructions provide you with information about caring for yourself after your procedure. Your health care provider may also give you more specific instructions. Your treatment has been planned according to current medical practices, but problems sometimes occur. Call your health care provider if you have any problems or questions after your procedure.  Dr Laural Golden [phone].  After hours, call the main hospital number and ask to have the GI doctor on call paged [place] After your procedure, it is typical to feel:  Soreness in your throat.  Pain with swallowing.  Sick to your stomach (nauseous).  Bloated.  Dizzy.  Fatigued. HOME CARE INSTRUCTIONS  Do not eat or drink anything until the numbing medicine (local anesthetic) has worn off and your gag reflex has returned. You will know that the local anesthetic has worn off when you can swallow comfortably.  Do not drive or operate machinery until directed by your health care provider.  Take medicines only as directed by your health care provider. SEEK MEDICAL CARE IF:   You cannot stop coughing.  You are not urinating at all or less than usual. SEEK IMMEDIATE MEDICAL CARE IF:  You have difficulty swallowing.  You cannot eat or drink.  You have worsening throat or chest pain.  You have dizziness or lightheadedness or you faint.  You have nausea or vomiting.  You have chills.  You have a fever.  You have severe abdominal pain.  You have black, tarry, or bloody stools.   This information is not intended to replace advice given to you by your health care provider. Make sure you discuss any  questions you have with your health care provider.   Document Released: 09/23/2012 Document Revised: 10/28/2014 Document Reviewed: 09/23/2012 Elsevier Interactive Patient Education [employer].

## 2016-06-25 ENCOUNTER — Encounter (HOSPITAL_COMMUNITY): Payer: Self-pay | Admitting: Internal Medicine

## 2016-06-26 ENCOUNTER — Other Ambulatory Visit (INDEPENDENT_AMBULATORY_CARE_PROVIDER_SITE_OTHER): Payer: Self-pay | Admitting: Internal Medicine

## 2016-06-26 DIAGNOSIS — G8929 Other chronic pain: Secondary | ICD-10-CM

## 2016-06-26 DIAGNOSIS — R1013 Epigastric pain: Principal | ICD-10-CM

## 2016-06-28 ENCOUNTER — Ambulatory Visit (HOSPITAL_COMMUNITY)
Admission: RE | Admit: 2016-06-28 | Discharge: 2016-06-28 | Disposition: A | Discharge status: Home / Self Care | Payer: BLUE CROSS/BLUE SHIELD | Source: Ambulatory Visit | Attending: Internal Medicine | Admitting: Internal Medicine

## 2016-06-28 DIAGNOSIS — R1013 Epigastric pain: Secondary | ICD-10-CM | POA: Insufficient documentation

## 2016-06-28 DIAGNOSIS — N200 Calculus of kidney: Secondary | ICD-10-CM | POA: Insufficient documentation

## 2016-06-28 DIAGNOSIS — R932 Abnormal findings on diagnostic imaging of liver and biliary tract: Secondary | ICD-10-CM | POA: Diagnosis not present

## 2016-06-28 DIAGNOSIS — G8929 Other chronic pain: Secondary | ICD-10-CM | POA: Diagnosis not present

## 2016-07-02 ENCOUNTER — Other Ambulatory Visit (INDEPENDENT_AMBULATORY_CARE_PROVIDER_SITE_OTHER): Payer: Self-pay | Admitting: Internal Medicine

## 2016-07-02 MED ORDER — PANTOPRAZOLE SODIUM 40 MG PO TBEC: 40.0000 mg | DELAYED_RELEASE_TABLET | Freq: Two times a day (BID) | ORAL | 2 refills | Status: DC

## 2016-08-28 DIAGNOSIS — D510 Vitamin B12 deficiency anemia due to intrinsic factor deficiency: Secondary | ICD-10-CM | POA: Diagnosis not present

## 2016-09-16 DIAGNOSIS — Z6828 Body mass index (BMI) 28.0-28.9, adult: Secondary | ICD-10-CM | POA: Diagnosis not present

## 2016-09-16 DIAGNOSIS — E663 Overweight: Secondary | ICD-10-CM | POA: Diagnosis not present

## 2016-09-16 DIAGNOSIS — G894 Chronic pain syndrome: Secondary | ICD-10-CM | POA: Diagnosis not present

## 2016-10-18 DIAGNOSIS — D510 Vitamin B12 deficiency anemia due to intrinsic factor deficiency: Secondary | ICD-10-CM | POA: Diagnosis not present

## 2016-11-18 ENCOUNTER — Other Ambulatory Visit (HOSPITAL_COMMUNITY): Payer: Self-pay | Admitting: Internal Medicine

## 2016-11-18 DIAGNOSIS — G459 Transient cerebral ischemic attack, unspecified: Secondary | ICD-10-CM

## 2016-11-27 ENCOUNTER — Ambulatory Visit (HOSPITAL_COMMUNITY)
Admission: RE | Admit: 2016-11-27 | Discharge: 2016-11-27 | Disposition: A | Discharge status: Home / Self Care | Payer: 59 | Source: Ambulatory Visit | Attending: Internal Medicine | Admitting: Internal Medicine

## 2016-11-27 DIAGNOSIS — G459 Transient cerebral ischemic attack, unspecified: Secondary | ICD-10-CM | POA: Diagnosis not present

## 2016-11-27 DIAGNOSIS — I6522 Occlusion and stenosis of left carotid artery: Secondary | ICD-10-CM | POA: Diagnosis not present

## 2016-12-23 DIAGNOSIS — D510 Vitamin B12 deficiency anemia due to intrinsic factor deficiency: Secondary | ICD-10-CM | POA: Diagnosis not present

## 2017-01-23 ENCOUNTER — Other Ambulatory Visit: Payer: Self-pay | Admitting: Neurology

## 2017-01-23 DIAGNOSIS — M542 Cervicalgia: Secondary | ICD-10-CM

## 2017-01-23 DIAGNOSIS — M5412 Radiculopathy, cervical region: Secondary | ICD-10-CM

## 2017-01-29 ENCOUNTER — Ambulatory Visit (HOSPITAL_COMMUNITY)
Admission: RE | Admit: 2017-01-29 | Discharge: 2017-01-29 | Disposition: A | Discharge status: Home / Self Care | Payer: 59 | Source: Ambulatory Visit | Attending: Neurology | Admitting: Neurology

## 2017-01-29 DIAGNOSIS — M129 Arthropathy, unspecified: Secondary | ICD-10-CM | POA: Insufficient documentation

## 2017-01-29 DIAGNOSIS — M4802 Spinal stenosis, cervical region: Secondary | ICD-10-CM | POA: Insufficient documentation

## 2017-01-29 DIAGNOSIS — M542 Cervicalgia: Secondary | ICD-10-CM | POA: Diagnosis not present

## 2017-01-29 DIAGNOSIS — M5412 Radiculopathy, cervical region: Secondary | ICD-10-CM | POA: Diagnosis not present

## 2017-02-24 DIAGNOSIS — D510 Vitamin B12 deficiency anemia due to intrinsic factor deficiency: Secondary | ICD-10-CM | POA: Diagnosis not present

## 2017-03-15 ENCOUNTER — Encounter (HOSPITAL_COMMUNITY): Payer: Self-pay | Admitting: Emergency Medicine

## 2017-03-15 ENCOUNTER — Emergency Department (HOSPITAL_COMMUNITY)
Admission: EM | Admit: 2017-03-15 | Discharge: 2017-03-15 | Disposition: A | Discharge status: Home / Self Care | Payer: 59 | Attending: Emergency Medicine | Admitting: Emergency Medicine

## 2017-03-15 ENCOUNTER — Emergency Department (HOSPITAL_COMMUNITY): Payer: 59

## 2017-03-15 DIAGNOSIS — Z85038 Personal history of other malignant neoplasm of large intestine: Secondary | ICD-10-CM | POA: Insufficient documentation

## 2017-03-15 DIAGNOSIS — N201 Calculus of ureter: Secondary | ICD-10-CM

## 2017-03-15 DIAGNOSIS — R109 Unspecified abdominal pain: Secondary | ICD-10-CM | POA: Diagnosis present

## 2017-03-15 DIAGNOSIS — N23 Unspecified renal colic: Secondary | ICD-10-CM | POA: Diagnosis not present

## 2017-03-15 HISTORY — DX: Calculus of kidney: N20.0

## 2017-03-15 LAB — COMPREHENSIVE METABOLIC PANEL
ALK PHOS: 54 U/L (ref 38–126)
ALT: 49 U/L (ref 17–63)
AST: 23 U/L (ref 15–41)
Albumin: 3.8 g/dL (ref 3.5–5.0)
Anion gap: 7 (ref 5–15)
BILIRUBIN TOTAL: 0.7 mg/dL (ref 0.3–1.2)
BUN: 20 mg/dL (ref 6–20)
CO2: 30 mmol/L (ref 22–32)
Calcium: 9.4 mg/dL (ref 8.9–10.3)
Chloride: 103 mmol/L (ref 101–111)
Creatinine, Ser: 1.3 mg/dL — ABNORMAL HIGH (ref 0.61–1.24)
GFR, EST NON AFRICAN AMERICAN: 54 mL/min — AB (ref >60)
GLUCOSE: 90 mg/dL (ref 65–99)
POTASSIUM: 3.8 mmol/L (ref 3.5–5.1)
Sodium: 140 mmol/L (ref 135–145)
Total Protein: 6.9 g/dL (ref 6.5–8.1)

## 2017-03-15 LAB — URINALYSIS, ROUTINE W REFLEX MICROSCOPIC
Bacteria, UA: NONE SEEN
Bilirubin Urine: NEGATIVE
GLUCOSE, UA: NEGATIVE mg/dL
Ketones, ur: NEGATIVE mg/dL
Leukocytes, UA: NEGATIVE
NITRITE: NEGATIVE
Protein, ur: NEGATIVE mg/dL
SPECIFIC GRAVITY, URINE: 1.018 (ref 1.005–1.030)
Squamous Epithelial / LPF: NONE SEEN
pH: 5 (ref 5.0–8.0)

## 2017-03-15 LAB — CBC WITH DIFFERENTIAL/PLATELET
BASOS PCT: 0 %
Basophils Absolute: 0 10*3/uL (ref 0.0–0.1)
Eosinophils Absolute: 0 10*3/uL (ref 0.0–0.7)
Eosinophils Relative: 0 %
HEMATOCRIT: 43.7 % (ref 39.0–52.0)
HEMOGLOBIN: 14.9 g/dL (ref 13.0–17.0)
LYMPHS ABS: 2.3 10*3/uL (ref 0.7–4.0)
Lymphocytes Relative: 13 %
MCH: 31 pg (ref 26.0–34.0)
MCHC: 34.1 g/dL (ref 30.0–36.0)
MCV: 91 fL (ref 78.0–100.0)
MONOS PCT: 10 %
Monocytes Absolute: 1.7 10*3/uL — ABNORMAL HIGH (ref 0.1–1.0)
NEUTROS ABS: 13.2 10*3/uL — AB (ref 1.7–7.7)
NEUTROS PCT: 77 %
Platelets: 181 10*3/uL (ref 150–400)
RBC: 4.8 MIL/uL (ref 4.22–5.81)
RDW: 13.5 % (ref 11.5–15.5)
WBC: 17.2 10*3/uL — ABNORMAL HIGH (ref 4.0–10.5)

## 2017-03-15 LAB — LIPASE, BLOOD: Lipase: 31 U/L (ref 11–51)

## 2017-03-15 MED ORDER — ONDANSETRON 4 MG PO TBDP: 4.0000 mg | ORAL_TABLET | Freq: Three times a day (TID) | ORAL | 0 refills | Status: DC | PRN

## 2017-03-15 MED ORDER — ONDANSETRON HCL 4 MG/2ML IJ SOLN
4.0000 mg | INTRAMUSCULAR | Status: AC | PRN
  Administered 2017-03-15 (×2): 4 mg via INTRAVENOUS
  Filled 2017-03-15 (×2): qty 2

## 2017-03-15 MED ORDER — HYDROMORPHONE HCL 1 MG/ML IJ SOLN
1.0000 mg | Freq: Once | INTRAMUSCULAR | Status: AC
  Administered 2017-03-15: 1 mg via INTRAVENOUS
  Filled 2017-03-15: qty 1

## 2017-03-15 MED ORDER — KETOROLAC TROMETHAMINE 30 MG/ML IJ SOLN
15.0000 mg | Freq: Once | INTRAMUSCULAR | Status: AC
  Administered 2017-03-15: 15 mg via INTRAVENOUS
  Filled 2017-03-15: qty 1

## 2017-03-15 MED ORDER — OXYCODONE-ACETAMINOPHEN 5-325 MG PO TABS: ORAL_TABLET | ORAL | 0 refills | Status: DC

## 2017-03-15 MED ORDER — HYDROMORPHONE HCL 1 MG/ML IJ SOLN
1.0000 mg | Freq: Once | INTRAMUSCULAR | Status: AC
Start: 2017-03-15 — End: 2017-03-15
  Administered 2017-03-15: 1 mg via INTRAVENOUS
  Filled 2017-03-15: qty 1

## 2017-03-15 NOTE — ED Notes (Signed)
Pt has voided  x1 this shift.  Pt says he do not feel the urge to void at this time.

## 2017-03-15 NOTE — ED Provider Notes (Signed)
[facility] DEPT Provider Note   CSN: [id number] Arrival date & time: 03/15/17  1148     History   Chief Complaint Chief Complaint  Patient presents with  . Flank Pain    right    HPI Eduardo [name] is a 71 y.o. male.  HPI  Pt was seen at 1215. Per pt, c/o sudden onset and persistence of constant right sided flank "pain" that began 0400 this morning PTA.  Pt describes the pain as and radiating into the right side of his abd.  Has been associated with nausea.  Denies testicular pain/swelling, no dysuria/hematuria, no abd pain, no diarrhea, no black or blood in stools, no emesis, no CP/SOB, no fevers, no rash.    Past Medical History:  Diagnosis Date  . Colon cancer ([place]) 10/20/13   colon resection  . DDD (degenerative disc disease), cervical   . GERD (gastroesophageal reflux disease)   . History of seasonal allergies   . Kidney stone   . Kidney stones   . Migraine    "maybe 1-2/month" (06/20/2015)  . Neuropathy 2010  . Skin cancer of face    "froze them off"  . Wears glasses     Patient Active Problem List   Diagnosis Date Noted  . Incisional hernia, without obstruction or gangrene 06/20/2015  . Urinary retention 02/07/2014  . Enterocutaneous fistula 12/10/2013  . Protein-calorie malnutrition, severe ([place]) 11/04/2013  . Abscess of abdominal cavity ([place]) 10/27/2013  . Colon cancer ([place]) 10/20/2013  . Constipation - functional 10/07/2013  . Colon cancer screening 10/07/2013  . GERD (gastroesophageal reflux disease) 10/07/2013    Past Surgical History:  Procedure Laterality Date  . APPENDECTOMY  10/20/13   (with right hemicolectomy)  . CARPAL TUNNEL RELEASE Left 2011  . CARPAL TUNNEL RELEASE Right 01/07/2013   Procedure: CARPAL TUNNEL RELEASE;  Surgeon: Tennis Must, MD;  Location: [facility];  Service: Orthopedics;  Laterality: Right;  . COLON RESECTION N/A 10/20/2013   Procedure:  LAPAROSCOPIC hand assisted partial colectomy;  Surgeon: Jamesetta So, MD;  Location: [facility];  Service: General;  Laterality: N/A;  . COLON SURGERY    . COLONOSCOPY    . COLONOSCOPY N/A 10/18/2013   Procedure: COLONOSCOPY;  Surgeon: Danie Binder, MD;  [facility];  Service: Endoscopy;  Laterality: N/A;  2:00-moved to 1030 Pam to notify pt  . COLONOSCOPY N/A 09/22/2014   Procedure: COLONOSCOPY;  Surgeon: Rogene Houston, MD;  [facility];  Service: Endoscopy;  Laterality: N/A;  1200  . ESOPHAGOGASTRODUODENOSCOPY N/A 06/19/2016   Procedure: ESOPHAGOGASTRODUODENOSCOPY (EGD);  Surgeon: Rogene Houston, MD;  [facility];  Service: Endoscopy;  Laterality: N/A;  3:00  . EXCISIONAL HEMORRHOIDECTOMY    . INCISIONAL HERNIA REPAIR  06/20/2015   WITH MYOFASCIAL ADVANCEMENT FLAP AND MESH   . INCISIONAL HERNIA REPAIR N/A 06/20/2015   Procedure: INCISIONAL HERNIA REPAIR WITH MYOFASCIAL ADVANCEMENT FLAP AND MESH;  Surgeon: Erroll Luna, MD;  Location: [facility];  Service: General;  Laterality: N/A;  . INGUINAL HERNIA REPAIR Left    "@ [facility]"  . INGUINAL HERNIA REPAIR Bilateral    "[place]"  . INSERTION OF MESH N/A 06/20/2015   Procedure: INSERTION OF MESH;  Surgeon: Erroll Luna, MD;  Location: [facility];  Service: General;  Laterality: N/A;  . KNEE ARTHROSCOPY Bilateral 2001  . TAKE DOWN OF INTESTINAL FISTULA N/A 02/02/2014   Procedure: TAKE DOWN OF ENTEROCUTANEOUS  FISTULA;  Surgeon:  Thomas A. Cornett, MD;  Location: [facility];  Service: General;  Laterality: N/A;  . TONSILLECTOMY    . UPPER GI ENDOSCOPY         Home Medications    Prior to Admission medications   Medication Sig Start Date End Date Taking? Authorizing Provider  Cetirizine HCl (ZYRTEC ALLERGY) 10 MG CAPS Take 1 capsule (10 mg total) by mouth daily as needed. 06/26/15   Rolland Porter, MD  Cyanocobalamin (VITAMIN B-12 IJ) Inject as directed every 30 (thirty) days. Administered Monthly @@ Oncologist/[place]    [provider]  EPINEPHrine 0.3 mg/0.3 mL IJ SOAJ  injection Inject 0.3 mLs (0.3 mg total) into the muscle once. 07/16/15   Rancour, Annie Main, MD  fluticasone (FLONASE) 50 MCG/ACT nasal spray Place 2 sprays into both nostrils daily as needed for allergies.  09/28/13   [provider]  HYDROcodone-acetaminophen (NORCO/VICODIN) 5-325 MG per tablet Take 1 tablet by mouth every 4 (four) hours as needed. 07/15/15   [provider]  LYRICA 75 MG capsule Take 75 mg by mouth daily. For pain 11/22/14   [provider]  ondansetron (ZOFRAN-ODT) 4 MG disintegrating tablet Take 1 tablet (4 mg total) by mouth every 6 (six) hours as needed for nausea. 06/24/15   Cornett, Marcello Moores, MD  pantoprazole (PROTONIX) 40 MG tablet Take 1 tablet (40 mg total) by mouth 2 (two) times daily before a meal. 07/02/16   Rehman, Mechele Dawley, MD  polyethylene glycol (MIRALAX / GLYCOLAX) packet Take 17 g by mouth daily. Patient taking differently: Take 17 g by mouth daily as needed for mild constipation.  06/24/15   Cornett, Marcello Moores, MD  Probiotic Product (PROBIOTIC DAILY PO) Take 1 capsule by mouth daily.     [provider]  rizatriptan (MAXALT) 10 MG tablet Take 10 mg by mouth as needed for migraine. May repeat in 2 hours if needed    [provider]  VENTOLIN HFA 108 (90 BASE) MCG/ACT inhaler Inhale 1 puff into the lungs every 6 (six) hours as needed for wheezing or shortness of breath.  11/26/13   [provider]    Family History Family History  Problem Relation Age of Onset  . Colon polyps Father   . Colon polyps Paternal Uncle   . Colon polyps Maternal Grandfather     Social History Social History  Substance Use Topics  . Smoking status: Never Smoker  . Smokeless tobacco: Never Used  . Alcohol use No     Allergies   Niaspan [niacin er] and Tape   Review of Systems Review of Systems ROS: Statement: All systems negative except as marked or noted in the HPI; Constitutional: Negative for fever and chills. ; ; Eyes: Negative  for eye pain, redness and discharge. ; ; ENMT: Negative for ear pain, hoarseness, nasal congestion, sinus pressure and sore throat. ; ; Cardiovascular: Negative for chest pain, palpitations, diaphoresis, dyspnea and peripheral edema. ; ; Respiratory: Negative for cough, wheezing and stridor. ; ; Gastrointestinal: +nausea. Negative for vomiting, diarrhea, abdominal pain, blood in stool, hematemesis, jaundice and rectal bleeding. . ; ; Genitourinary: +flank pain. Negative for dysuria and hematuria. ; ; Genital:  No penile drainage or rash, no testicular pain or swelling, no scrotal rash or swelling. ;; Musculoskeletal: Negative for neck pain. Negative for swelling and trauma.; ; Skin: Negative for pruritus, rash, abrasions, blisters, bruising and skin lesion.; ; Neuro: Negative for headache, lightheadedness and neck stiffness. Negative for weakness, altered level of consciousness, altered mental  status, extremity weakness, paresthesias, involuntary movement, seizure and syncope.       Physical Exam Updated Vital Signs BP (!) 159/76 (BP Location: Right Arm)   Pulse 66   Temp 97.8 F (36.6 C) (Oral)   Resp (!) 22   Ht 6\' 2"  (1.88 m)   Wt 95.3 kg (210 lb)   SpO2 100%   BMI 26.96 kg/m   Physical Exam 1220: Physical examination:  Nursing notes reviewed; Vital signs and O2 SAT reviewed;  Constitutional: Well developed, Well nourished, Well hydrated, Uncomfortable appearing; Head:  Normocephalic, atraumatic; Eyes: EOMI, PERRL, No scleral icterus; ENMT: Mouth and pharynx normal, Mucous membranes moist; Neck: Supple, Full range of motion, No lymphadenopathy; Cardiovascular: Regular rate and rhythm, No gallop; Respiratory: Breath sounds clear & equal bilaterally, No wheezes.  Speaking full sentences with ease, Normal respiratory effort/excursion; Chest: Nontender, Movement normal; Abdomen: Soft, +RUQ and RLQ tender to palp. No rebound or guarding. Nondistended, Normal bowel sounds; Genitourinary: No CVA  tenderness; Spine:  No midline CS, TS, LS tenderness. +TTP right lumbar paraspinal muscles.;; Extremities: Pulses normal, No tenderness, No edema, No calf edema or asymmetry.; Neuro: AA&Ox3, Major CN grossly intact.  Speech clear. No gross focal motor or sensory deficits in extremities.; Skin: Color normal, Warm, Dry.   ED Treatments / Results  Labs (all labs ordered are listed, but only abnormal results are displayed)   EKG  EKG Interpretation None       Radiology   Procedures Procedures (including critical care time)  Medications Ordered in ED Medications  ondansetron (ZOFRAN) injection 4 mg (4 mg Intravenous Given 03/15/17 1237)  HYDROmorphone (DILAUDID) injection 1 mg (1 mg Intravenous Given 03/15/17 1237)     Initial Impression / Assessment and Plan / ED Course  I have reviewed the triage vital signs and the nursing notes.  Pertinent labs & imaging results that were available during my care of the patient were reviewed by me and considered in my medical decision making (see chart for details).  MDM Reviewed: previous chart, nursing note and vitals Reviewed previous: labs and CT scan Interpretation: labs and CT scan   Results for orders placed or performed during the hospital encounter of 03/15/17  Urinalysis, Routine w reflex microscopic  Result Value Ref Range   Color, Urine YELLOW YELLOW   APPearance HAZY (A) CLEAR   Specific Gravity, Urine 1.018 1.005 - 1.030   pH 5.0 5.0 - 8.0   Glucose, UA NEGATIVE NEGATIVE mg/dL   Hgb urine dipstick LARGE (A) NEGATIVE   Bilirubin Urine NEGATIVE NEGATIVE   Ketones, ur NEGATIVE NEGATIVE mg/dL   Protein, ur NEGATIVE NEGATIVE mg/dL   Nitrite NEGATIVE NEGATIVE   Leukocytes, UA NEGATIVE NEGATIVE   RBC / HPF TOO NUMEROUS TO COUNT 0 - 5 RBC/hpf   WBC, UA 0-5 0 - 5 WBC/hpf   Bacteria, UA NONE SEEN NONE SEEN   Squamous Epithelial / LPF NONE SEEN NONE SEEN   Ca Oxalate Crys, UA PRESENT   Comprehensive metabolic panel  Result  Value Ref Range   Sodium 140 135 - 145 mmol/L   Potassium 3.8 3.5 - 5.1 mmol/L   Chloride 103 101 - 111 mmol/L   CO2 30 22 - 32 mmol/L   Glucose, Bld 90 65 - 99 mg/dL   BUN 20 6 - 20 mg/dL   Creatinine, Ser 1.30 (H) 0.61 - 1.24 mg/dL   Calcium 9.4 8.9 - 10.3 mg/dL   Total Protein 6.9 6.5 - 8.1 g/dL   Albumin  3.8 3.5 - 5.0 g/dL   AST 23 15 - 41 U/L   ALT 49 17 - 63 U/L   Alkaline Phosphatase 54 38 - 126 U/L   Total Bilirubin 0.7 0.3 - 1.2 mg/dL   GFR calc non Af Amer 54 (L) >60 mL/min   GFR calc Af Amer >60 >60 mL/min   Anion gap 7 5 - 15  Lipase, blood  Result Value Ref Range   Lipase 31 11 - 51 U/L  CBC with Differential  Result Value Ref Range   WBC 17.2 (H) 4.0 - 10.5 K/uL   RBC 4.80 4.22 - 5.81 MIL/uL   Hemoglobin 14.9 13.0 - 17.0 g/dL   HCT 43.7 39.0 - 52.0 %   MCV 91.0 78.0 - 100.0 fL   MCH 31.0 26.0 - 34.0 pg   MCHC 34.1 30.0 - 36.0 g/dL   RDW 13.5 11.5 - 15.5 %   Platelets 181 150 - 400 K/uL   Neutrophils Relative % 77 %   Neutro Abs 13.2 (H) 1.7 - 7.7 K/uL   Lymphocytes Relative 13 %   Lymphs Abs 2.3 0.7 - 4.0 K/uL   Monocytes Relative 10 %   Monocytes Absolute 1.7 (H) 0.1 - 1.0 K/uL   Eosinophils Relative 0 %   Eosinophils Absolute 0.0 0.0 - 0.7 K/uL   Basophils Relative 0 %   Basophils Absolute 0.0 0.0 - 0.1 K/uL   Ct Renal Stone Study Result Date: 03/15/2017 CLINICAL DATA:  Right-sided flank pain for several hours, initial encounter EXAM: CT ABDOMEN AND PELVIS WITHOUT CONTRAST TECHNIQUE: Multidetector CT imaging of the abdomen and pelvis was performed following the standard protocol without IV contrast. COMPARISON:  12/13/2014 FINDINGS: Lower chest: Lung bases are well aerated with some minimal atelectasis on the right. Right lower lobe pulmonary nodules are again seen and stable as is a subpleural left lower lobe pulmonary nodule. Hepatobiliary: No focal liver abnormality is seen. No gallstones, gallbladder wall thickening, or biliary dilatation. Pancreas:  Unremarkable. No pancreatic ductal dilatation or surrounding inflammatory changes. Spleen: Normal in size without focal abnormality. Adrenals/Urinary Tract: The adrenal glands are within normal limits. The left kidney is well visualized and demonstrates a few tiny nonobstructing renal stones. The left ureter is unremarkable. The bladder is partially distended. The left kidney demonstrates hydronephrosis as well as significant perinephric stranding the and hydroureter. This extends inferiorly to the level of the ureterovesical junction at which point a 5 mm obstructing stone is noted best seen on image number 92 of series 2. Stomach/Bowel: Changes consistent with prior colonic surgery are again seen. The appendix appears to been surgically removed with the right colon. No obstructive or inflammatory changes are seen. Vascular/Lymphatic: Aortic atherosclerosis. No enlarged abdominal or pelvic lymph nodes. Reproductive: Prostate is unremarkable. Other: Changes of prior inguinal hernia repair are noted bilaterally. Musculoskeletal: Mild degenerative changes of lumbar spine are seen. IMPRESSION: Distal right ureteral stone measuring 5 mm causing obstructive change and perinephric inflammatory change. Tiny left renal calculi without obstructive change. Stable lower lobe pulmonary nodules dating back to 2014 consistent with a benign etiology. Postsurgical changes as described above. Electronically Signed   By: Inez Catalina M.D.   On: 03/15/2017 14:30    1515:  Pt continue to c/o pain. IV dilaudid #3 dose ordered. BUN/Cr mildly elevated from baseline. WBC count elevated, but pt afebrile and without UTI on Udip. T/C to Uro Dr. Alinda Money, case discussed, including:  HPI, pertinent PM/SHx, VS/PE, dx testing, ED course and treatment:  Agrees with ED workup, states to give IV toradol 15mg  with the dilaudid (aware of increased BUN/Cr), if pt does not achieve pain control then transfer to [facility] ED and he will evaluate, if achieve  pain control have pt f/u office. Dx and testing, as well as d/w Uro MD, d/w pt and family.  Questions answered.  Verb understanding, agreeable with plan.   1810:  Pt states he feels "much better" after meds and wants to go home now. Has tol PO well without N/V. Has void spontaneously x2 while in the ED. Remains afebrile. Offered transfer to [facility] ED for Uro MD evaluation; pt refuses, stating he wants to go home now. Continue to tx symptomatically at this time. Dx and testing d/w pt and family.  Questions answered.  Verb understanding, agreeable to d/c home with outpt f/u.   Final Clinical Impressions(s) / ED Diagnoses   Final diagnoses:  None    New Prescriptions New Prescriptions   No medications on file     Francine Graven, DO 03/19/17 1525

## 2017-03-15 NOTE — ED Notes (Signed)
Voided 500 ml of yellow urine without difficulty.

## 2017-03-15 NOTE — Discharge Instructions (Signed)
Take the prescriptions as directed. Keep yourself hydrated. Call the Urologist on Tuesday to schedule a follow up appointment on Tuesday.  Return to the Emergency Department immediately if worsening.

## 2017-03-15 NOTE — ED Notes (Signed)
Tolerating po fluids well.   

## 2017-03-15 NOTE — ED Triage Notes (Signed)
Pt states that he has been having right flank pain that started in his back and is now wrapping around to the front since 4 am.  Pt states that he is also having trouble urinating.  Pt has a history of kidney stones.

## 2017-03-16 LAB — URINE CULTURE: Culture: NO GROWTH

## 2017-03-21 ENCOUNTER — Emergency Department (HOSPITAL_COMMUNITY): Payer: 59

## 2017-03-21 ENCOUNTER — Encounter (HOSPITAL_COMMUNITY): Payer: Self-pay

## 2017-03-21 ENCOUNTER — Observation Stay (HOSPITAL_COMMUNITY)
Admission: EM | Admit: 2017-03-21 | Discharge: 2017-03-22 | Disposition: A | Discharge status: Home / Self Care | Payer: 59 | Attending: Internal Medicine | Admitting: Internal Medicine

## 2017-03-21 DIAGNOSIS — R112 Nausea with vomiting, unspecified: Secondary | ICD-10-CM | POA: Diagnosis not present

## 2017-03-21 DIAGNOSIS — G43A1 Cyclical vomiting, intractable: Secondary | ICD-10-CM | POA: Insufficient documentation

## 2017-03-21 DIAGNOSIS — Z85038 Personal history of other malignant neoplasm of large intestine: Secondary | ICD-10-CM | POA: Diagnosis not present

## 2017-03-21 DIAGNOSIS — R1110 Vomiting, unspecified: Secondary | ICD-10-CM | POA: Diagnosis present

## 2017-03-21 DIAGNOSIS — N23 Unspecified renal colic: Secondary | ICD-10-CM | POA: Diagnosis not present

## 2017-03-21 DIAGNOSIS — K219 Gastro-esophageal reflux disease without esophagitis: Secondary | ICD-10-CM | POA: Diagnosis present

## 2017-03-21 DIAGNOSIS — N201 Calculus of ureter: Secondary | ICD-10-CM | POA: Diagnosis present

## 2017-03-21 LAB — CBC WITH DIFFERENTIAL/PLATELET
BASOS ABS: 0 10*3/uL (ref 0.0–0.1)
BASOS PCT: 0 %
Eosinophils Absolute: 0 10*3/uL (ref 0.0–0.7)
Eosinophils Relative: 0 %
HEMATOCRIT: 45.9 % (ref 39.0–52.0)
HEMOGLOBIN: 15.7 g/dL (ref 13.0–17.0)
Lymphocytes Relative: 11 %
Lymphs Abs: 1 10*3/uL (ref 0.7–4.0)
MCH: 31.2 pg (ref 26.0–34.0)
MCHC: 34.2 g/dL (ref 30.0–36.0)
MCV: 91.3 fL (ref 78.0–100.0)
Monocytes Absolute: 0.8 10*3/uL (ref 0.1–1.0)
Monocytes Relative: 9 %
NEUTROS ABS: 7.4 10*3/uL (ref 1.7–7.7)
NEUTROS PCT: 80 %
Platelets: 172 10*3/uL (ref 150–400)
RBC: 5.03 MIL/uL (ref 4.22–5.81)
RDW: 13.3 % (ref 11.5–15.5)
WBC: 9.2 10*3/uL (ref 4.0–10.5)

## 2017-03-21 LAB — COMPREHENSIVE METABOLIC PANEL
ALBUMIN: 4.2 g/dL (ref 3.5–5.0)
ALT: 36 U/L (ref 17–63)
AST: 19 U/L (ref 15–41)
Alkaline Phosphatase: 56 U/L (ref 38–126)
Anion gap: 11 (ref 5–15)
BILIRUBIN TOTAL: 1.7 mg/dL — AB (ref 0.3–1.2)
BUN: 26 mg/dL — AB (ref 6–20)
CHLORIDE: 100 mmol/L — AB (ref 101–111)
CO2: 26 mmol/L (ref 22–32)
CREATININE: 1.25 mg/dL — AB (ref 0.61–1.24)
Calcium: 9.8 mg/dL (ref 8.9–10.3)
GFR calc Af Amer: >60 mL/min (ref >60)
GFR, EST NON AFRICAN AMERICAN: 57 mL/min — AB (ref >60)
GLUCOSE: 96 mg/dL (ref 65–99)
POTASSIUM: 3.6 mmol/L (ref 3.5–5.1)
Sodium: 137 mmol/L (ref 135–145)
TOTAL PROTEIN: 8.1 g/dL (ref 6.5–8.1)

## 2017-03-21 LAB — LIPASE, BLOOD: LIPASE: 25 U/L (ref 11–51)

## 2017-03-21 MED ORDER — ONDANSETRON HCL 4 MG/2ML IJ SOLN
4.0000 mg | Freq: Once | INTRAMUSCULAR | Status: AC
  Administered 2017-03-21: 4 mg via INTRAVENOUS

## 2017-03-21 MED ORDER — FAMOTIDINE 20 MG PO TABS
20.0000 mg | ORAL_TABLET | Freq: Two times a day (BID) | ORAL | Status: DC
  Administered 2017-03-21: 20 mg via ORAL
  Filled 2017-03-21 (×2): qty 1

## 2017-03-21 MED ORDER — TAMSULOSIN HCL 0.4 MG PO CAPS
0.4000 mg | ORAL_CAPSULE | Freq: Every day | ORAL | Status: DC
  Administered 2017-03-22: 0.4 mg via ORAL
  Filled 2017-03-21: qty 1

## 2017-03-21 MED ORDER — SODIUM CHLORIDE 0.9 % IV BOLUS (SEPSIS)
500.0000 mL | Freq: Once | INTRAVENOUS | Status: AC
Start: 2017-03-21 — End: 2017-03-21
  Administered 2017-03-21: 500 mL via INTRAVENOUS

## 2017-03-21 MED ORDER — SODIUM CHLORIDE 0.9 % IV SOLN
INTRAVENOUS | Status: DC
  Administered 2017-03-21 – 2017-03-22 (×3): via INTRAVENOUS

## 2017-03-21 MED ORDER — ACETAMINOPHEN 325 MG PO TABS: 650.0000 mg | ORAL_TABLET | Freq: Four times a day (QID) | ORAL | Status: DC | PRN

## 2017-03-21 MED ORDER — MORPHINE SULFATE (PF) 4 MG/ML IV SOLN
4.0000 mg | Freq: Once | INTRAVENOUS | Status: AC
  Administered 2017-03-21: 4 mg via INTRAVENOUS
  Filled 2017-03-21: qty 1

## 2017-03-21 MED ORDER — MORPHINE SULFATE (PF) 2 MG/ML IV SOLN
2.0000 mg | INTRAVENOUS | Status: DC | PRN
  Administered 2017-03-21 – 2017-03-22 (×3): 2 mg via INTRAVENOUS
  Filled 2017-03-21 (×3): qty 1

## 2017-03-21 MED ORDER — LORATADINE 10 MG PO TABS
10.0000 mg | ORAL_TABLET | Freq: Every day | ORAL | Status: DC
  Filled 2017-03-21: qty 1

## 2017-03-21 MED ORDER — ONDANSETRON HCL 4 MG/2ML IJ SOLN
4.0000 mg | Freq: Once | INTRAMUSCULAR | Status: AC
  Administered 2017-03-21: 4 mg via INTRAVENOUS
  Filled 2017-03-21: qty 2

## 2017-03-21 MED ORDER — ONDANSETRON HCL 4 MG/2ML IJ SOLN
4.0000 mg | Freq: Four times a day (QID) | INTRAMUSCULAR | Status: DC | PRN
  Administered 2017-03-21 – 2017-03-22 (×3): 4 mg via INTRAVENOUS
  Filled 2017-03-21 (×3): qty 2

## 2017-03-21 MED ORDER — ONDANSETRON HCL 4 MG/2ML IJ SOLN
INTRAMUSCULAR | Status: AC
  Administered 2017-03-21: 4 mg via INTRAVENOUS
  Filled 2017-03-21: qty 2

## 2017-03-21 MED ORDER — ACETAMINOPHEN 650 MG RE SUPP: 650.0000 mg | Freq: Four times a day (QID) | RECTAL | Status: DC | PRN

## 2017-03-21 MED ORDER — IOPAMIDOL (ISOVUE-300) INJECTION 61%
100.0000 mL | Freq: Once | INTRAVENOUS | Status: AC | PRN
  Administered 2017-03-21: 100 mL via INTRAVENOUS

## 2017-03-21 MED ORDER — TAMSULOSIN HCL 0.4 MG PO CAPS
0.4000 mg | ORAL_CAPSULE | Freq: Once | ORAL | Status: AC
  Administered 2017-03-21: 0.4 mg via ORAL
  Filled 2017-03-21: qty 1

## 2017-03-21 MED ORDER — ENOXAPARIN SODIUM 40 MG/0.4ML ~~LOC~~ SOLN
40.0000 mg | SUBCUTANEOUS | Status: DC
  Administered 2017-03-21: 40 mg via SUBCUTANEOUS
  Filled 2017-03-21: qty 0.4

## 2017-03-21 MED ORDER — ONDANSETRON HCL 4 MG PO TABS: 4.0000 mg | ORAL_TABLET | Freq: Four times a day (QID) | ORAL | Status: DC | PRN

## 2017-03-21 MED ORDER — FLUTICASONE PROPIONATE 50 MCG/ACT NA SUSP: 2.0000 | Freq: Every day | NASAL | Status: DC | PRN

## 2017-03-21 MED ORDER — OXYCODONE-ACETAMINOPHEN 5-325 MG PO TABS
1.0000 | ORAL_TABLET | Freq: Four times a day (QID) | ORAL | Status: DC | PRN
Start: 2017-03-21 — End: 2017-03-22
  Administered 2017-03-21 (×2): 2 via ORAL
  Filled 2017-03-21 (×2): qty 2

## 2017-03-21 MED ORDER — SODIUM CHLORIDE 0.9 % IV BOLUS (SEPSIS)
500.0000 mL | Freq: Once | INTRAVENOUS | Status: AC
  Administered 2017-03-21: 500 mL via INTRAVENOUS

## 2017-03-21 NOTE — ED Triage Notes (Signed)
Reports of vomiting and headache since yesterday.  Possible expose to black mold per patient.

## 2017-03-21 NOTE — ED Provider Notes (Signed)
[facility] DEPT Provider Note   CSN: [id number] Arrival date & time: 03/21/17  1043     History   Chief Complaint Chief Complaint  Patient presents with  . Emesis    HPI Eduardo [name] is a 71 y.o. male.  HPI patient was seen in the emergency department on 5/26 for right flank pain and diagnosed with ureteral stone. States yesterday he began having vomiting. Has had greater than 20 episodes of vomiting over the course of 24 hours. Complains of diffuse abdominal pain. Has episodic headaches are worse with vomiting. Describes headache since frontal. Denies diarrhea. States he had a bowel movement earlier today. No blood or melanotic stool. No bloody or coffee ground emesis. Denies fever or chills. Has been unable to tolerate anything by mouth. Denies urinary symptoms including hematuria, dysuria or frequency. Denies flank pain. Has had multiple abdominal surgeries in the past.  Past Medical History:  Diagnosis Date  . Colon cancer ([place]) 10/20/13   colon resection  . DDD (degenerative disc disease), cervical   . GERD (gastroesophageal reflux disease)   . History of seasonal allergies   . Kidney stone   . Kidney stones   . Migraine    "maybe 1-2/month" (06/20/2015)  . Neuropathy 2010  . Skin cancer of face    "froze them off"  . Wears glasses     Patient Active Problem List   Diagnosis Date Noted  . Right ureteral stone 03/21/2017  . Incisional hernia, without obstruction or gangrene 06/20/2015  . Urinary retention 02/07/2014  . Enterocutaneous fistula 12/10/2013  . Protein-calorie malnutrition, severe ([place]) 11/04/2013  . Abscess of abdominal cavity ([address]) 10/27/2013  . Colon cancer ([place]) 10/20/2013  . Constipation - functional 10/07/2013  . Colon cancer screening 10/07/2013  . GERD (gastroesophageal reflux disease) 10/07/2013    Past Surgical History:  Procedure Laterality Date  . APPENDECTOMY  10/20/13   (with right hemicolectomy)  . CARPAL TUNNEL RELEASE Left 2011   . CARPAL TUNNEL RELEASE Right 01/07/2013   Procedure: CARPAL TUNNEL RELEASE;  Surgeon: Tennis Must, MD;  Location: [facility];  Service: Orthopedics;  Laterality: Right;  . COLON RESECTION N/A 10/20/2013   Procedure:  LAPAROSCOPIC hand assisted partial colectomy;  Surgeon: Jamesetta So, MD;  Location: [facility];  Service: General;  Laterality: N/A;  . COLON SURGERY    . COLONOSCOPY    . COLONOSCOPY N/A 10/18/2013   Procedure: COLONOSCOPY;  Surgeon: Danie Binder, MD;  [facility];  Service: Endoscopy;  Laterality: N/A;  2:00-moved to 1030 Pam to notify pt  . COLONOSCOPY N/A 09/22/2014   Procedure: COLONOSCOPY;  Surgeon: Rogene Houston, MD;  [facility];  Service: Endoscopy;  Laterality: N/A;  1200  . ESOPHAGOGASTRODUODENOSCOPY N/A 06/19/2016   Procedure: ESOPHAGOGASTRODUODENOSCOPY (EGD);  Surgeon: Rogene Houston, MD;  [facility];  Service: Endoscopy;  Laterality: N/A;  3:00  . EXCISIONAL HEMORRHOIDECTOMY    . INCISIONAL HERNIA REPAIR  06/20/2015   WITH MYOFASCIAL ADVANCEMENT FLAP AND MESH   . INCISIONAL HERNIA REPAIR N/A 06/20/2015   Procedure: INCISIONAL HERNIA REPAIR WITH MYOFASCIAL ADVANCEMENT FLAP AND MESH;  Surgeon: Erroll Luna, MD;  Location: [facility];  Service: General;  Laterality: N/A;  . INGUINAL HERNIA REPAIR Left    "@ [facility] Na"  . INGUINAL HERNIA REPAIR Bilateral    "[place] [facility]"  . INSERTION OF MESH N/A 06/20/2015   Procedure: INSERTION OF MESH;  Surgeon: Erroll Luna, MD;  Location:  [facility];  Service: General;  Laterality: N/A;  . KNEE ARTHROSCOPY Bilateral 2001  . TAKE DOWN OF INTESTINAL FISTULA N/A 02/02/2014   Procedure: TAKE DOWN OF ENTEROCUTANEOUS  FISTULA;  Surgeon: Marcello Moores A. Cornett, MD;  Location: [facility];  Service: General;  Laterality: N/A;  . TONSILLECTOMY    . UPPER GI ENDOSCOPY         Home Medications    Prior to Admission medications   Medication Sig Start Date End Date Taking? Authorizing Provider    Cetirizine HCl (ZYRTEC ALLERGY) 10 MG CAPS Take 1 capsule (10 mg total) by mouth daily as needed. 06/26/15  Yes Rolland Porter, MD  Cyanocobalamin (VITAMIN B-12 IJ) Inject as directed every 30 (thirty) days. Administered Monthly @@ Oncologist/[place]   Yes [provider]  EPINEPHrine 0.3 mg/0.3 mL IJ SOAJ injection Inject 0.3 mLs (0.3 mg total) into the muscle once. 07/16/15  Yes Rancour, Annie Main, MD  fluticasone (FLONASE) 50 MCG/ACT nasal spray Place 2 sprays into both nostrils daily as needed for allergies.  09/28/13  Yes [provider]  HYDROcodone-acetaminophen (NORCO/VICODIN) 5-325 MG per tablet Take 1 tablet by mouth every 4 (four) hours as needed for moderate pain.  07/15/15  Yes [provider]  Probiotic Product (PROBIOTIC DAILY PO) Take 1 capsule by mouth daily.    Yes [provider]  ranitidine (ZANTAC) 75 MG tablet Take 75 mg by mouth 2 (two) times daily as needed for heartburn.   Yes [provider]  rizatriptan (MAXALT) 10 MG tablet Take 10 mg by mouth as needed for migraine. May repeat in 2 hours if needed   Yes [provider]  ondansetron (ZOFRAN ODT) 4 MG disintegrating tablet Take 1 tablet (4 mg total) by mouth every 8 (eight) hours as needed for nausea or vomiting. 03/22/17   Isaac Bliss, Rayford Halsted, MD  oxyCODONE-acetaminophen (PERCOCET/ROXICET) 5-325 MG tablet Take 1-2 tablets by mouth every 6 (six) hours as needed for moderate pain. for pain 03/16/17   [provider]  tamsulosin (FLOMAX) 0.4 MG CAPS capsule Take 1 capsule (0.4 mg total) by mouth daily. 03/23/17   Isaac Bliss, Rayford Halsted, MD    Family History Family History  Problem Relation Age of Onset  . Colon polyps Father   . Colon polyps Paternal Uncle   . Colon polyps Maternal Grandfather     Social History Social History  Substance Use Topics  . Smoking status: Never Smoker  . Smokeless tobacco: Never Used  . Alcohol use No     Allergies   Niaspan  [niacin er] and Tape   Review of Systems Review of Systems  Constitutional: Positive for fatigue. Negative for chills and fever.  HENT: Negative for sore throat, trouble swallowing and voice change.   Respiratory: Negative for cough and shortness of breath.   Cardiovascular: Negative for chest pain, palpitations and leg swelling.  Gastrointestinal: Positive for abdominal pain, nausea and vomiting. Negative for abdominal distention, blood in stool, constipation and diarrhea.  Genitourinary: Negative for dysuria, flank pain, frequency and hematuria.  Musculoskeletal: Negative for back pain, myalgias, neck pain and neck stiffness.  Skin: Negative for rash and wound.  Neurological: Positive for headaches. Negative for dizziness, weakness, light-headedness and numbness.  All other systems reviewed and are negative.    Physical Exam Updated Vital Signs BP 138/77 (BP Location: Left Arm)   Pulse 64   Temp 98.5 F (36.9 C) (Oral)   Resp 20   Ht 6\' 2"  (1.88 m)  Wt 95.3 kg (210 lb)   SpO2 100%   BMI 26.96 kg/m   Physical Exam  Constitutional: He is oriented to person, place, and time. He appears well-developed and well-nourished.  HENT:  Head: Normocephalic and atraumatic.  Mouth/Throat: Oropharynx is clear and moist.  Eyes: EOM are normal. Pupils are equal, round, and reactive to light.  Neck: Normal range of motion. Neck supple.  Cardiovascular: Normal rate and regular rhythm.   Pulmonary/Chest: Effort normal and breath sounds normal.  Abdominal: Soft. Bowel sounds are normal. There is tenderness (diffuse abdominal tenderness especially in bilaterallower quadrants.). There is no rebound and no guarding.  High-pitched bowel sounds in the right lower quadrant.  Musculoskeletal: Normal range of motion. He exhibits no edema or tenderness.  No CVA tenderness bilaterally. No lower extremity swelling or asymmetry.  Neurological: He is alert and oriented to person, place, and time.    Moves all extremities without focal deficit. Sensation fully intact.  Skin: Skin is warm and dry. Capillary refill takes less than 2 seconds. No rash noted. No erythema.  Psychiatric: He has a normal mood and affect. His behavior is normal.  Nursing note and vitals reviewed.    ED Treatments / Results  Labs (all labs ordered are listed, but only abnormal results are displayed) Labs Reviewed  COMPREHENSIVE METABOLIC PANEL - Abnormal; Notable for the following:       Result Value   Chloride 100 (*)    BUN 26 (*)    Creatinine, Ser 1.25 (*)    Total Bilirubin 1.7 (*)    GFR calc non Af Amer 57 (*)    All other components within normal limits  URINALYSIS, ROUTINE W REFLEX MICROSCOPIC - Abnormal; Notable for the following:    Hgb urine dipstick SMALL (*)    Ketones, ur 5 (*)    All other components within normal limits  BASIC METABOLIC PANEL - Abnormal; Notable for the following:    BUN 23 (*)    Calcium 8.2 (*)    All other components within normal limits  CBC WITH DIFFERENTIAL/PLATELET  LIPASE, BLOOD    EKG  EKG Interpretation None       Radiology No results found.  Procedures Procedures (including critical care time)  Medications Ordered in ED Medications  sodium chloride 0.9 % bolus 500 mL (0 mLs Intravenous Stopped 03/21/17 1245)  morphine 4 MG/ML injection 4 mg (4 mg Intravenous Given 03/21/17 1145)  ondansetron (ZOFRAN) injection 4 mg (4 mg Intravenous Given 03/21/17 1145)  iopamidol (ISOVUE-300) 61 % injection 100 mL (100 mLs Intravenous Contrast Given 03/21/17 1307)  ondansetron (ZOFRAN) injection 4 mg (4 mg Intravenous Given 03/21/17 1402)  morphine 4 MG/ML injection 4 mg (4 mg Intravenous Given 03/21/17 1402)  sodium chloride 0.9 % bolus 500 mL (0 mLs Intravenous Stopped 03/21/17 1624)  tamsulosin (FLOMAX) capsule 0.4 mg (0.4 mg Oral Given 03/21/17 1622)     Initial Impression / Assessment and Plan / ED Course  I have reviewed the triage vital signs and the nursing  notes.  Pertinent labs & imaging results that were available during my care of the patient were reviewed by me and considered in my medical decision making (see chart for details).     Patient with persistent pain despite multiple doses of medication. CT with evidence of stone in the distal UVJ on the right. There is mild hydroureter and hydronephrosis. Discussed with urologist who reviewed the patient's CT. States given how close the stone is to passing,  Advises admission for pain control and start Flomax. If patient is still symptomatic and has not passed the stone, may need intervention at that point. Dr. Nehemiah Settle will see the patient in the emergency department.  Final Clinical Impressions(s) / ED Diagnoses   Final diagnoses:  Renal colic on right side  Intractable vomiting with nausea, unspecified vomiting type    New Prescriptions Discharge Medication List as of 03/22/2017 12:12 PM    START taking these medications   Details  tamsulosin (FLOMAX) 0.4 MG CAPS capsule Take 1 capsule (0.4 mg total) by mouth daily., Starting Sun 03/23/2017, Print         Julianne Rice, MD 03/24/17 2351

## 2017-03-21 NOTE — Progress Notes (Signed)
Pt arrived on the floor. Vitals WNL. C/O a headache, pain medication given. Will continue to monitor.

## 2017-03-21 NOTE — ED Notes (Signed)
Pt attempted to give urine sample, but unable to at this time.  ?

## 2017-03-21 NOTE — H&P (Signed)
History and Physical  Eduardo [name] TIW:[id number] DOB: 27-Jul-1946 DOA: 03/21/2017  Referring physician: Dr Lita Mains, ED physician PCP: Redmond School, MD  Outpatient Specialists: none  Patient Coming From: home  Chief Complaint: vomiting  HPI: Eduardo [name] is a 71 y.o. male with a history of GERD, kidney stones, degenerative disc disease, colon cancer, seasonal allergies, migraines. Patient was seen on 5/26 due to right-sided pain that started that morning and diagnosed with right ureteral stone. Patient was sent home with pain medicine. The pain has continued to increase the oral pain medicines have not been able to control the patient's pain. The pain has been so severe that the patient has had approximate 20 episodes of vomiting over the past 24 hours and is on been unable to take anything orally, both food and liquid. No other palliating or provoking factors.  Emergency Department Course: CT scan shows calculi in the right UVJ. Urology consulted - recommended observation as the stone appears on the cusp of passing into the bladder. Asked the patient be started on Flomax and asked to be reconsulted in the morning if patient is still having pain.  Review of Systems:   Pt denies any fevers, chills, nausea, vomiting, diarrhea, constipation, abdominal pain, shortness of breath, dyspnea on exertion, orthopnea, cough, wheezing, palpitations, headache, vision changes, lightheadedness, dizziness, melena, rectal bleeding.  Review of systems are otherwise negative  Past Medical History:  Diagnosis Date  . Colon cancer ([place]) 10/20/13   colon resection  . DDD (degenerative disc disease), cervical   . GERD (gastroesophageal reflux disease)   . History of seasonal allergies   . Kidney stone   . Kidney stones   . Migraine    "maybe 1-2/month" (06/20/2015)  . Neuropathy 2010  . Skin cancer of face    "froze them off"  . Wears glasses    Past Surgical History:  Procedure Laterality Date  .  APPENDECTOMY  10/20/13   (with right hemicolectomy)  . CARPAL TUNNEL RELEASE Left 2011  . CARPAL TUNNEL RELEASE Right 01/07/2013   Procedure: CARPAL TUNNEL RELEASE;  Surgeon: Tennis Must, MD;  Location: [facility];  Service: Orthopedics;  Laterality: Right;  . COLON RESECTION N/A 10/20/2013   Procedure:  LAPAROSCOPIC hand assisted partial colectomy;  Surgeon: Jamesetta So, MD;  Location[facility];  Service: General;  Laterality: N/A;  . COLON SURGERY    . COLONOSCOPY    . COLONOSCOPY N/A 10/18/2013   Procedure: COLONOSCOPY;  Surgeon: Danie Binder, MD;  [facility];  Service: Endoscopy;  Laterality: N/A;  2:00-moved to 1030 Pam to notify pt  . COLONOSCOPY N/A 09/22/2014   Procedure: COLONOSCOPY;  Surgeon: Rogene Houston, MD;  [facility];  Service: Endoscopy;  Laterality: N/A;  1200  . ESOPHAGOGASTRODUODENOSCOPY N/A 06/19/2016   Procedure: ESOPHAGOGASTRODUODENOSCOPY (EGD);  Surgeon: Rogene Houston, MD;  [facility];  Service: Endoscopy;  Laterality: N/A;  3:00  . EXCISIONAL HEMORRHOIDECTOMY    . INCISIONAL HERNIA REPAIR  06/20/2015   WITH MYOFASCIAL ADVANCEMENT FLAP AND MESH   . INCISIONAL HERNIA REPAIR N/A 06/20/2015   Procedure: INCISIONAL HERNIA REPAIR WITH MYOFASCIAL ADVANCEMENT FLAP AND MESH;  Surgeon: Erroll Luna, MD;  Location: [facility];  Service: General;  Laterality: N/A;  . INGUINAL HERNIA REPAIR Left    "@ [facility]"  . INGUINAL HERNIA REPAIR Bilateral    "[place] [facility]"  . INSERTION OF MESH N/A 06/20/2015   Procedure: INSERTION OF MESH;  Surgeon: Erroll Luna, MD;  Location: [facility];  Service: General;  Laterality: N/A;  . KNEE ARTHROSCOPY Bilateral 2001  . TAKE DOWN OF INTESTINAL FISTULA N/A 02/02/2014   Procedure: TAKE DOWN OF ENTEROCUTANEOUS  FISTULA;  Surgeon: Marcello Moores A. Cornett, MD;  Location: [facility];  Service: General;  Laterality: N/A;  . TONSILLECTOMY    . UPPER GI ENDOSCOPY     Social History:  reports that he has never  smoked. He has never used smokeless tobacco. He reports that he does not drink alcohol or use drugs. Patient lives at [facility]  . Niaspan [Niacin Er] Rash  . Tape Rash    Paper tape OK    Family History  Problem Relation Age of Onset  . Colon polyps Father   . Colon polyps Paternal Uncle   . Colon polyps Maternal Grandfather       Prior to Admission medications   Medication Sig Start Date End Date Taking? Authorizing Provider  Cetirizine HCl (ZYRTEC ALLERGY) 10 MG CAPS Take 1 capsule (10 mg total) by mouth daily as needed. 06/26/15  Yes Rolland Porter, MD  Cyanocobalamin (VITAMIN B-12 IJ) Inject as directed every 30 (thirty) days. Administered Monthly @@ Oncologist/[place]   Yes [provider]  EPINEPHrine 0.3 mg/0.3 mL IJ SOAJ injection Inject 0.3 mLs (0.3 mg total) into the muscle once. 07/16/15  Yes Rancour, Annie Main, MD  fluticasone (FLONASE) 50 MCG/ACT nasal spray Place 2 sprays into both nostrils daily as needed for allergies.  09/28/13  Yes [provider]  HYDROcodone-acetaminophen (NORCO/VICODIN) 5-325 MG per tablet Take 1 tablet by mouth every 4 (four) hours as needed for moderate pain.  07/15/15  Yes [provider]  ondansetron (ZOFRAN ODT) 4 MG disintegrating tablet Take 1 tablet (4 mg total) by mouth every 8 (eight) hours as needed for nausea or vomiting. 03/15/17  Yes Francine Graven, DO  Probiotic Product (PROBIOTIC DAILY PO) Take 1 capsule by mouth daily.    Yes [provider]  ranitidine (ZANTAC) 75 MG tablet Take 75 mg by mouth 2 (two) times daily as needed for heartburn.   Yes [provider]  rizatriptan (MAXALT) 10 MG tablet Take 10 mg by mouth as needed for migraine. May repeat in 2 hours if needed   Yes [provider]  oxyCODONE-acetaminophen (PERCOCET/ROXICET) 5-325 MG tablet Take 1-2 tablets by mouth every 6 (six) hours as needed for moderate pain. for pain 03/16/17   [provider]    predniSONE (DELTASONE) 20 MG tablet Take 20 mg by mouth daily. 03/05/17   [provider]    Physical Exam: BP (!) 141/90   Pulse 67   Temp 97.6 F (36.4 C) (Oral)   Resp 20   Ht 6\' 2"  (1.88 m)   Wt 95.3 kg (210 lb)   SpO2 100%   BMI 26.96 kg/m   General: Elderly adult male. Awake and alert and oriented x3. No acute cardiopulmonary distress.  HEENT: Normocephalic atraumatic.  Right and left ears normal in appearance.  Pupils equal, round, reactive to light. Extraocular muscles are intact. Sclerae anicteric and noninjected.  Moist mucosal membranes. No mucosal lesions.  Neck: Neck supple without lymphadenopathy. No carotid bruits. No masses palpated.  Cardiovascular: Regular rate with normal S1-S2 sounds. No murmurs, rubs, gallops auscultated. No JVD.  Respiratory: Good respiratory effort with no wheezes, rales, rhonchi. Lungs clear to auscultation bilaterally.  No accessory muscle use. Abdomen: Soft, nondistended. Tender in the right pelvis. Active  bowel sounds. No masses or hepatosplenomegaly  Skin: No rashes, lesions, or ulcerations.  Dry, warm to touch. 2+ dorsalis pedis and radial pulses. Musculoskeletal: No calf or leg pain. All major joints not erythematous nontender.  No upper or lower joint deformation.  Good ROM.  No contractures  Psychiatric: Intact judgment and insight. Pleasant and cooperative. Neurologic: No focal neurological deficits. Strength is 5/5 and symmetric in upper and lower extremities.  Cranial nerves II through XII are grossly intact.           Labs on Admission: I have personally reviewed following labs and imaging studies  CBC:  Recent Labs Lab 03/15/17 1235 03/21/17 1147  WBC 17.2* 9.2  NEUTROABS 13.2* 7.4  HGB 14.9 15.7  HCT 43.7 45.9  MCV 91.0 91.3  PLT 181 355   Basic Metabolic Panel:  Recent Labs Lab 03/15/17 1235 03/21/17 1147  NA 140 137  K 3.8 3.6  CL 103 100*  CO2 30 26  GLUCOSE 90 96  BUN 20 26*  CREATININE 1.30*  1.25*  CALCIUM 9.4 9.8   GFR: Estimated Creatinine Clearance: 64.8 mL/min (A) (by C-G formula based on SCr of 1.25 mg/dL (H)). Liver Function Tests:  Recent Labs Lab 03/15/17 1235 03/21/17 1147  AST 23 19  ALT 49 36  ALKPHOS 54 56  BILITOT 0.7 1.7*  PROT 6.9 8.1  ALBUMIN 3.8 4.2    Recent Labs Lab 03/15/17 1235 03/21/17 1147  LIPASE 31 25   No results for input(s): AMMONIA in the last 168 hours. Coagulation Profile: No results for input(s): INR, PROTIME in the last 168 hours. Cardiac Enzymes: No results for input(s): CKTOTAL, CKMB, CKMBINDEX, TROPONINI in the last 168 hours. BNP (last 3 results) No results for input(s): PROBNP in the last 8760 hours. HbA1C: No results for input(s): HGBA1C in the last 72 hours. CBG: No results for input(s): GLUCAP in the last 168 hours. Lipid Profile: No results for input(s): CHOL, HDL, LDLCALC, TRIG, CHOLHDL, LDLDIRECT in the last 72 hours. Thyroid Function Tests: No results for input(s): TSH, T4TOTAL, FREET4, T3FREE, THYROIDAB in the last 72 hours. Anemia Panel: No results for input(s): VITAMINB12, FOLATE, FERRITIN, TIBC, IRON, RETICCTPCT in the last 72 hours. Urine analysis:    Component Value Date/Time   COLORURINE YELLOW 03/15/2017 1219   APPEARANCEUR HAZY (A) 03/15/2017 1219   LABSPEC 1.018 03/15/2017 1219   PHURINE 5.0 03/15/2017 1219   GLUCOSEU NEGATIVE 03/15/2017 1219   HGBUR LARGE (A) 03/15/2017 1219   BILIRUBINUR NEGATIVE 03/15/2017 1219   KETONESUR NEGATIVE 03/15/2017 1219   PROTEINUR NEGATIVE 03/15/2017 1219   UROBILINOGEN 0.2 11/28/2014 1919   NITRITE NEGATIVE 03/15/2017 1219   LEUKOCYTESUR NEGATIVE 03/15/2017 1219   Sepsis Labs: @LABRCNTIP (procalcitonin:4,lacticidven:4) ) Recent Results (from the past 240 hour(s))  Urine culture     Status: None   Collection Time: 03/15/17 12:19 PM  Result Value Ref Range Status   Specimen Description URINE, CLEAN CATCH  Final   Special Requests NONE  Final   Culture    Final    NO GROWTH Performed at [facility], [place] [address], [place], [place] [address]    Report Status 03/16/2017 FINAL  Final     Radiological Exams on Admission: Ct Abdomen Pelvis W Contrast  Result Date: 03/21/2017 CLINICAL DATA:  Vomiting and abdominal pain. Nausea and headache. Recent right renal stone. EXAM: CT ABDOMEN AND PELVIS WITH CONTRAST TECHNIQUE: Multidetector CT imaging of the abdomen and pelvis was performed using the standard protocol following  bolus administration of intravenous contrast. CONTRAST:  164mL ISOVUE-300 IOPAMIDOL (ISOVUE-300) INJECTION 61% COMPARISON:  03/15/2017 FINDINGS: Lower chest: Small subpleural pulmonary nodules unchanged in chronic, therefore benign. No active pulmonary process. Mild linear scarring at the bases. Hepatobiliary: Benign 1 cm cyst of the left lobe of the liver. No significant liver finding. No calcified gallstones. Pancreas: Normal Spleen: Normal Adrenals/Urinary Tract: Adrenal glands are normal. 1 cm simple cyst at the upper pole of the right kidney. Left kidney is normal. Right kidney shows hydroureteronephrosis with the ureter being dilated all way to the bladder. There is a 5 mm stone either at the ureterovesical junction or having chest passed into the bladder. There is no longer any edema around the right kidney. Stomach/Bowel: Previous partial colectomy right side. No acute bowel finding. Vascular/Lymphatic: Aortic atherosclerosis. No aneurysm. IVC is normal. No retroperitoneal adenopathy. Reproductive: Normal Other: No free fluid or air. Musculoskeletal: Chronic degenerative changes in the lower lumbar spine. IMPRESSION: Previously seen 5 mm stone just proximal to the UVJ is either in the process of passing through a swollen UVJ or has just passed into the bladder. Mild hydroureteronephrosis on the right but there is no longer any pyelo sinus extravasation in the region of the right kidney. No other urinary tract calculi. Small  subpleural nodules at the lung bases that are chronic, stable and benign. Electronically Signed   By: Nelson Chimes M.D.   On: 03/21/2017 13:32    Assessment/Plan: Active Problems:   GERD (gastroesophageal reflux disease)   Right ureteral stone    This patient was discussed with the ED physician, including pertinent vitals, physical exam findings, labs, and imaging.  We also discussed care given by the ED provider.  #1 right ureteral stone  Observation  Flomax  Percocet scheduled with morphine for breakthrough  Reconsult urology in the morning if patient still having pain  Clear liquid diet  Strain urine #2 GERD  Continue antacid  DVT prophylaxis: Lovenox Consultants: Urology Code Status: Full code Family Communication: Wife  Disposition Plan: Observation overnight with possible discharge to home in the morning   Truett Mainland, DO [facility] Pager [phone]  If 7PM-7AM, please contact night-coverage [email or web address] Password [email or web address]

## 2017-03-22 DIAGNOSIS — N23 Unspecified renal colic: Secondary | ICD-10-CM | POA: Diagnosis not present

## 2017-03-22 DIAGNOSIS — N201 Calculus of ureter: Secondary | ICD-10-CM

## 2017-03-22 DIAGNOSIS — K219 Gastro-esophageal reflux disease without esophagitis: Secondary | ICD-10-CM

## 2017-03-22 DIAGNOSIS — R112 Nausea with vomiting, unspecified: Secondary | ICD-10-CM

## 2017-03-22 LAB — BASIC METABOLIC PANEL
ANION GAP: 7 (ref 5–15)
BUN: 23 mg/dL — AB (ref 6–20)
CO2: 23 mmol/L (ref 22–32)
Calcium: 8.2 mg/dL — ABNORMAL LOW (ref 8.9–10.3)
Chloride: 105 mmol/L (ref 101–111)
Creatinine, Ser: 1.07 mg/dL (ref 0.61–1.24)
GFR calc Af Amer: >60 mL/min (ref >60)
GLUCOSE: 95 mg/dL (ref 65–99)
POTASSIUM: 3.6 mmol/L (ref 3.5–5.1)
Sodium: 135 mmol/L (ref 135–145)

## 2017-03-22 LAB — URINALYSIS, ROUTINE W REFLEX MICROSCOPIC
BACTERIA UA: NONE SEEN
Bilirubin Urine: NEGATIVE
Glucose, UA: NEGATIVE mg/dL
KETONES UR: 5 mg/dL — AB
LEUKOCYTES UA: NEGATIVE
Nitrite: NEGATIVE
PH: 6 (ref 5.0–8.0)
PROTEIN: NEGATIVE mg/dL
Specific Gravity, Urine: 1.024 (ref 1.005–1.030)
Squamous Epithelial / LPF: NONE SEEN

## 2017-03-22 MED ORDER — TAMSULOSIN HCL 0.4 MG PO CAPS: 0.4000 mg | ORAL_CAPSULE | Freq: Every day | ORAL | 2 refills | Status: DC

## 2017-03-22 MED ORDER — ONDANSETRON 4 MG PO TBDP: 4.0000 mg | ORAL_TABLET | Freq: Three times a day (TID) | ORAL | 0 refills | Status: DC | PRN

## 2017-03-22 NOTE — Discharge Instructions (Signed)
Kidney Stones Kidney stones (urolithiasis) are solid, rock-like deposits that form inside of the organs that make urine (kidneys). A kidney stone may form in a kidney and move into the bladder, where it can cause intense pain and block the flow of urine. Kidney stones are created when high levels of certain minerals are found in the urine. They are usually passed through urination, but in some cases, medical treatment may be needed to remove them. What are the causes? Kidney stones may be caused by:  A condition in which certain glands produce too much parathyroid hormone (primary hyperparathyroidism), which causes too much calcium buildup in the blood.  Buildup of uric acid crystals in the bladder (hyperuricosuria). Uric acid is a chemical that the body produces when you eat certain foods. It usually exits the body in the urine.  Narrowing (stricture) of one or both of the tubes that drain urine from the kidneys to the bladder (ureters).  A kidney blockage that is present at birth (congenital obstruction).  Past surgery on the kidney or the ureters, such as gastric bypass surgery.  What increases the risk? The following factors make you more likely to develop kidney stones:  Having had a kidney stone in the past.  Having a family history of kidney stones.  Not drinking enough water.  Eating a diet that is high in protein, salt (sodium), or sugar.  Being overweight or obese.  What are the signs or symptoms? Symptoms of a kidney stone may include:  Nausea.  Vomiting.  Blood in the urine (hematuria).  Pain in the side of the abdomen, right below the ribs (flank pain). Pain usually spreads (radiates) to the groin.  Needing to urinate frequently or urgently.  How is this diagnosed? This condition may be diagnosed based on:  Your medical history.  A physical exam.  Blood tests.  Urine tests.  CT scan.  Abdominal X-ray.  A procedure to examine the inside of the  bladder (cystoscopy).  How is this treated? Treatment for kidney stones depends on the size, location, and makeup of the stones. Treatment may involve:  Analyzing your urine before and after you pass the stone through urination.  Being monitored at the hospital until you pass the stone through urination.  Increasing your fluid intake and decreasing the amount of calcium and protein in your diet.  A procedure to break up kidney stones in the bladder using: ? A focused beam of light (laser therapy). ? Shock waves (extracorporeal shock wave lithotripsy).  Surgery to remove kidney stones. This may be needed if you have severe pain or have stones that block your urinary tract.  Follow these instructions at home: Eating and drinking   Drink enough fluid to keep your urine clear or pale yellow. This will help you to pass the kidney stone.  If directed, change your diet. This may include: ? Limiting how much sodium you eat. ? Eating more fruits and vegetables. ? Limiting how much meat, poultry, fish, and eggs you eat.  Follow instructions from your health care provider about eating or drinking restrictions. General instructions  Collect urine samples as told by your health care provider. You may need to collect a urine sample: ? 24 hours after you pass the stone. ? 8-12 weeks after passing the kidney stone, and every 6-12 months after that.  Strain your urine every time you urinate, for as long as directed. Use the strainer that your health care provider recommends.  Do not throw out  the kidney stone after passing it. Keep the stone so it can be tested by your health care provider. Testing the makeup of your kidney stone may help prevent you from getting kidney stones in the future.  Take over-the-counter and prescription medicines only as told by your health care provider.  Keep all follow-up visits as told by your health care provider. This is important. You may need follow-up  X-rays or ultrasounds to make sure that your stone has passed. How is this prevented? To prevent another kidney stone:  Drink enough fluid to keep your urine clear or pale yellow. This is the best way to prevent kidney stones.  Eat a healthy diet and follow recommendations from your health care provider about foods to avoid. You may be instructed to eat a low-protein diet. Recommendations vary depending on the type of kidney stone that you have.  Maintain a healthy weight.  Contact a health care provider if:  You have pain that gets worse or does not get better with medicine. Get help right away if:  You have a fever or chills.  You develop severe pain.  You develop new abdominal pain.  You faint.  You are unable to urinate. This information is not intended to replace advice given to you by your health care provider. Make sure you discuss any questions you have with your health care provider. Document Released: 10/07/2005 Document Revised: 04/26/2016 Document Reviewed: 03/22/2016 Elsevier Interactive Patient Education  2017 [employer]   Nausea, Adult Nausea is the feeling of an upset stomach or having to vomit. Nausea on its own is not usually a serious concern, but it may be an early sign of a more serious medical problem. As nausea gets worse, it can lead to vomiting. If vomiting develops, or if you are not able to drink enough fluids, you are at risk of becoming dehydrated. Dehydration can make you tired and thirsty, cause you to have a dry mouth, and decrease how often you urinate. Older adults and people with other diseases or a weak immune system are at higher risk for dehydration. The main goals of treating your nausea are:  To limit repeated nausea episodes.  To prevent vomiting and dehydration.  Follow these instructions at home: Follow instructions from your health care provider about how to care for yourself at home. Eating and drinking Follow these  recommendations as told by your health care provider:  Take an oral rehydration solution (ORS). This is a drink that is sold at pharmacies and retail stores.  Drink clear fluids in small amounts as you are able. Clear fluids include water, ice chips, diluted fruit juice, and low-calorie sports drinks.  Eat bland, easy-to-digest foods in small amounts as you are able. These foods include bananas, applesauce, rice, lean meats, toast, and crackers.  Avoid drinking fluids that contain a lot of sugar or caffeine, such as energy drinks, sports drinks, and soda.  Avoid alcohol.  Avoid spicy or fatty foods.  General instructions  Drink enough fluid to keep your urine clear or pale yellow.  Wash your hands often. If soap and water are not available, use hand sanitizer.  Make sure that all people in your household wash their hands well and often.  Rest at home while you recover.  Take over-the-counter and prescription medicines only as told by your health care provider.  Breathe slowly and deeply when you feel nauseous.  Watch your condition for any changes.  Keep all follow-up visits as told by  your health care provider. This is important. Contact a health care provider if:  You have a headache.  You have new symptoms.  Your nausea gets worse.  You have a fever.  You feel light-headed or dizzy.  You vomit.  You cannot keep fluids down. Get help right away if:  You have pain in your chest, neck, arm, or jaw.  You feel extremely weak or you faint.  You have vomit that is bright red or looks like coffee grounds.  You have bloody or black stools or stools that look like tar.  You have a severe headache, a stiff neck, or both.  You have severe pain, cramping, or bloating in your abdomen.  You have a rash.  You have difficulty breathing or are breathing very quickly.  Your heart is beating very quickly.  Your skin feels cold and clammy.  You feel confused.  You  have pain when you urinate.  You have signs of dehydration, such as: ? Dark urine, very little, or no urine. ? Cracked lips. ? Dry mouth. ? Sunken eyes. ? Sleepiness. ? Weakness. These symptoms may represent a serious problem that is an emergency. Do not wait to see if the symptoms will go away. Get medical help right away. Call your local emergency services (911 in the [place] Do not drive yourself to the hospital. This information is not intended to replace advice given to you by your health care provider. Make sure you discuss any questions you have with your health care provider. Document Released: 11/14/2004 Document Revised: 03/11/2016 Document Reviewed: 06/13/2015 Elsevier Interactive Patient Education  2017 [employer].

## 2017-03-22 NOTE — Progress Notes (Signed)
Patient c/o nausea this am. Given zofran as ordered PRN nausea/vomiting. On reassessment, pt states nausea unrelieved and zofran not reducing nausea. Denies vomiting. Text-paged MD to notify.

## 2017-03-22 NOTE — Discharge Summary (Signed)
Physician Discharge Summary  Eduardo [name] HQI:[id number] DOB: [birth year] DOA: 03/21/2017  PCP: Redmond School, MD  Admit date: 03/21/2017 Discharge date: 03/22/2017  Time spent: 45 minutes  Recommendations for Outpatient Follow-up:  -Will be discharged home today. -Advised follow up with PCP in 2 weeks.   Discharge Diagnoses:  Active Problems:   GERD (gastroesophageal reflux disease)   Right ureteral stone   Discharge Condition: Stable and improved  Filed Weights   03/21/17 1107 03/21/17 1729  Weight: 95.3 kg (210 lb) 95.3 kg (210 lb)    History of present illness:  As per Dr. Nehemiah Settle on 6/1: Eduardo [name] is a 71 y.o. male with a history of GERD, kidney stones, degenerative disc disease, colon cancer, seasonal allergies, migraines. Patient was seen on 5/26 due to right-sided pain that started that morning and diagnosed with right ureteral stone. Patient was sent home with pain medicine. The pain has continued to increase the oral pain medicines have not been able to control the patient's pain. The pain has been so severe that the patient has had approximate 20 episodes of vomiting over the past 24 hours and is on been unable to take anything orally, both food and liquid. No other palliating or provoking factors.  Emergency Department Course: CT scan shows calculi in the right UVJ. Urology consulted - recommended observation as the stone appears on the cusp of passing into the bladder. Asked the patient be started on Flomax and asked to be reconsulted in the morning if patient is still having pain.  Hospital Course:   Right Ureteral Stone -Pain has resolved. -Has not passed stone yet. -Has percocet at home. Will Dc with zofran for nausea. -Flomax prescribed at the direction of urology.  Rest of chronic conditions are stable.  Procedures:  None   Consultations:  GU, via phone  Discharge Instructions  Discharge Instructions    Diet - low sodium heart healthy     Complete by:  As directed    Increase activity slowly    Complete by:  As directed      Allergies as of 03/22/2017      Reactions   Niaspan [niacin Er] Rash   Tape Rash   Paper tape OK      Medication List    STOP taking these medications   predniSONE 20 MG tablet Commonly known as:  DELTASONE     TAKE these medications   Cetirizine HCl 10 MG Caps Commonly known as:  ZYRTEC ALLERGY Take 1 capsule (10 mg total) by mouth daily as needed.   EPINEPHrine 0.3 mg/0.3 mL Soaj injection Commonly known as:  EPI-PEN Inject 0.3 mLs (0.3 mg total) into the muscle once.   fluticasone 50 MCG/ACT nasal spray Commonly known as:  FLONASE Place 2 sprays into both nostrils daily as needed for allergies.   HYDROcodone-acetaminophen 5-325 MG tablet Commonly known as:  NORCO/VICODIN Take 1 tablet by mouth every 4 (four) hours as needed for moderate pain.   ondansetron 4 MG disintegrating tablet Commonly known as:  ZOFRAN ODT Take 1 tablet (4 mg total) by mouth every 8 (eight) hours as needed for nausea or vomiting.   oxyCODONE-acetaminophen 5-325 MG tablet Commonly known as:  PERCOCET/ROXICET Take 1-2 tablets by mouth every 6 (six) hours as needed for moderate pain. for pain   PROBIOTIC DAILY PO Take 1 capsule by mouth daily.   ranitidine 75 MG tablet Commonly known as:  ZANTAC Take 75 mg by mouth 2 (two) times  daily as needed for heartburn.   rizatriptan 10 MG tablet Commonly known as:  MAXALT Take 10 mg by mouth as needed for migraine. May repeat in 2 hours if needed   tamsulosin 0.4 MG Caps capsule Commonly known as:  FLOMAX Take 1 capsule (0.4 mg total) by mouth daily. Start taking on:  03/23/2017   VITAMIN B-12 IJ Inject as directed every 30 (thirty) days. Administered Monthly @@ Oncologist/[place]      Allergies  Allergen Reactions  . Niaspan [Niacin Er] Rash  . Tape Rash    Paper tape OK   Follow-up Information    Redmond School, MD. Schedule an appointment as soon  as possible for a visit in 2 week(s).   Specialty:  Internal Medicine Contact information: [address] [place] [address] [phone]            The results of significant diagnostics from this hospitalization (including imaging, microbiology, ancillary and laboratory) are listed below for reference.    Significant Diagnostic Studies: Ct Abdomen Pelvis W Contrast  Result Date: 03/21/2017 CLINICAL DATA:  Vomiting and abdominal pain. Nausea and headache. Recent right renal stone. EXAM: CT ABDOMEN AND PELVIS WITH CONTRAST TECHNIQUE: Multidetector CT imaging of the abdomen and pelvis was performed using the standard protocol following bolus administration of intravenous contrast. CONTRAST:  116mL ISOVUE-300 IOPAMIDOL (ISOVUE-300) INJECTION 61% COMPARISON:  03/15/2017 FINDINGS: Lower chest: Small subpleural pulmonary nodules unchanged in chronic, therefore benign. No active pulmonary process. Mild linear scarring at the bases. Hepatobiliary: Benign 1 cm cyst of the left lobe of the liver. No significant liver finding. No calcified gallstones. Pancreas: Normal Spleen: Normal Adrenals/Urinary Tract: Adrenal glands are normal. 1 cm simple cyst at the upper pole of the right kidney. Left kidney is normal. Right kidney shows hydroureteronephrosis with the ureter being dilated all way to the bladder. There is a 5 mm stone either at the ureterovesical junction or having chest passed into the bladder. There is no longer any edema around the right kidney. Stomach/Bowel: Previous partial colectomy right side. No acute bowel finding. Vascular/Lymphatic: Aortic atherosclerosis. No aneurysm. IVC is normal. No retroperitoneal adenopathy. Reproductive: Normal Other: No free fluid or air. Musculoskeletal: Chronic degenerative changes in the lower lumbar spine. IMPRESSION: Previously seen 5 mm stone just proximal to the UVJ is either in the process of passing through a swollen UVJ or has just passed into the  bladder. Mild hydroureteronephrosis on the right but there is no longer any pyelo sinus extravasation in the region of the right kidney. No other urinary tract calculi. Small subpleural nodules at the lung bases that are chronic, stable and benign. Electronically Signed   By: Nelson Chimes M.D.   On: 03/21/2017 13:32   Ct Renal Stone Study  Result Date: 03/15/2017 CLINICAL DATA:  Right-sided flank pain for several hours, initial encounter EXAM: CT ABDOMEN AND PELVIS WITHOUT CONTRAST TECHNIQUE: Multidetector CT imaging of the abdomen and pelvis was performed following the standard protocol without IV contrast. COMPARISON:  12/13/2014 FINDINGS: Lower chest: Lung bases are well aerated with some minimal atelectasis on the right. Right lower lobe pulmonary nodules are again seen and stable as is a subpleural left lower lobe pulmonary nodule. Hepatobiliary: No focal liver abnormality is seen. No gallstones, gallbladder wall thickening, or biliary dilatation. Pancreas: Unremarkable. No pancreatic ductal dilatation or surrounding inflammatory changes. Spleen: Normal in size without focal abnormality. Adrenals/Urinary Tract: The adrenal glands are within normal limits. The left kidney is well visualized and demonstrates a few  tiny nonobstructing renal stones. The left ureter is unremarkable. The bladder is partially distended. The left kidney demonstrates hydronephrosis as well as significant perinephric stranding the and hydroureter. This extends inferiorly to the level of the ureterovesical junction at which point a 5 mm obstructing stone is noted best seen on image number 92 of series 2. Stomach/Bowel: Changes consistent with prior colonic surgery are again seen. The appendix appears to been surgically removed with the right colon. No obstructive or inflammatory changes are seen. Vascular/Lymphatic: Aortic atherosclerosis. No enlarged abdominal or pelvic lymph nodes. Reproductive: Prostate is unremarkable. Other:  Changes of prior inguinal hernia repair are noted bilaterally. Musculoskeletal: Mild degenerative changes of lumbar spine are seen. IMPRESSION: Distal right ureteral stone measuring 5 mm causing obstructive change and perinephric inflammatory change. Tiny left renal calculi without obstructive change. Stable lower lobe pulmonary nodules dating back to 2014 consistent with a benign etiology. Postsurgical changes as described above. Electronically Signed   By: Inez Catalina M.D.   On: 03/15/2017 14:30    Microbiology: Recent Results (from the past 240 hour(s))  Urine culture     Status: None   Collection Time: 03/15/17 12:19 PM  Result Value Ref Range Status   Specimen Description URINE, CLEAN CATCH  Final   Special Requests NONE  Final   Culture   Final    NO GROWTH Performed at [facility], [address] [place],  [address]    Report Status 03/16/2017 FINAL  Final     Labs: Basic Metabolic Panel:  Recent Labs Lab 03/15/17 1235 03/21/17 1147 03/22/17 0518  NA 140 137 135  K 3.8 3.6 3.6  CL 103 100* 105  CO2 30 26 23   GLUCOSE 90 96 95  BUN 20 26* 23*  CREATININE 1.30* 1.25* 1.07  CALCIUM 9.4 9.8 8.2*   Liver Function Tests:  Recent Labs Lab 03/15/17 1235 03/21/17 1147  AST 23 19  ALT 49 36  ALKPHOS 54 56  BILITOT 0.7 1.7*  PROT 6.9 8.1  ALBUMIN 3.8 4.2    Recent Labs Lab 03/15/17 1235 03/21/17 1147  LIPASE 31 25   No results for input(s): AMMONIA in the last 168 hours. CBC:  Recent Labs Lab 03/15/17 1235 03/21/17 1147  WBC 17.2* 9.2  NEUTROABS 13.2* 7.4  HGB 14.9 15.7  HCT 43.7 45.9  MCV 91.0 91.3  PLT 181 172   Cardiac Enzymes: No results for input(s): CKTOTAL, CKMB, CKMBINDEX, TROPONINI in the last 168 hours. BNP: BNP (last 3 results) No results for input(s): BNP in the last 8760 hours.  ProBNP (last 3 results) No results for input(s): PROBNP in the last 8760 hours.  CBG: No results for input(s): GLUCAP in the last 168  hours.     SignedLelon Frohlich  [facility] Pager: [phone] 03/22/2017, 11:41 AM

## 2017-03-22 NOTE — Progress Notes (Signed)
Reviewed discharge instructions with patient. Given copy of AVS and prescriptions. Verbalized understanding of instructions and follow-up. IV site d/c'd, site within normal limits. No c/o pain or nausea at discharge. Pt left floor in stable condition accompanied by nurse tech. Donavan Foil, RN

## 2017-03-24 DIAGNOSIS — D510 Vitamin B12 deficiency anemia due to intrinsic factor deficiency: Secondary | ICD-10-CM | POA: Diagnosis not present

## 2017-04-16 ENCOUNTER — Other Ambulatory Visit: Payer: Self-pay | Admitting: Neurology

## 2017-04-16 DIAGNOSIS — M542 Cervicalgia: Secondary | ICD-10-CM

## 2017-04-22 DIAGNOSIS — D510 Vitamin B12 deficiency anemia due to intrinsic factor deficiency: Secondary | ICD-10-CM | POA: Diagnosis not present

## 2017-04-24 ENCOUNTER — Ambulatory Visit
Admission: RE | Admit: 2017-04-24 | Discharge: 2017-04-24 | Disposition: A | Discharge status: Home / Self Care | Payer: Medicare Other | Source: Ambulatory Visit | Attending: Neurology | Admitting: Neurology

## 2017-04-24 DIAGNOSIS — M542 Cervicalgia: Secondary | ICD-10-CM | POA: Diagnosis not present

## 2017-04-24 MED ORDER — TRIAMCINOLONE ACETONIDE 40 MG/ML IJ SUSP (RADIOLOGY)
60.0000 mg | Freq: Once | INTRAMUSCULAR | Status: AC
  Administered 2017-04-24: 60 mg via EPIDURAL

## 2017-04-24 MED ORDER — IOPAMIDOL (ISOVUE-M 300) INJECTION 61%
1.0000 mL | Freq: Once | INTRAMUSCULAR | Status: AC | PRN
  Administered 2017-04-24: 1 mL via EPIDURAL

## 2017-04-24 NOTE — Discharge Instructions (Signed)

## 2017-06-04 DIAGNOSIS — D510 Vitamin B12 deficiency anemia due to intrinsic factor deficiency: Secondary | ICD-10-CM | POA: Diagnosis not present

## 2017-07-01 DIAGNOSIS — D510 Vitamin B12 deficiency anemia due to intrinsic factor deficiency: Secondary | ICD-10-CM | POA: Diagnosis not present

## 2017-08-11 DIAGNOSIS — D510 Vitamin B12 deficiency anemia due to intrinsic factor deficiency: Secondary | ICD-10-CM | POA: Diagnosis not present

## 2017-08-21 ENCOUNTER — Other Ambulatory Visit (INDEPENDENT_AMBULATORY_CARE_PROVIDER_SITE_OTHER): Payer: Self-pay | Admitting: *Deleted

## 2017-08-21 ENCOUNTER — Other Ambulatory Visit: Payer: Self-pay | Admitting: Neurosurgery

## 2017-08-21 DIAGNOSIS — C183 Malignant neoplasm of hepatic flexure: Secondary | ICD-10-CM | POA: Insufficient documentation

## 2017-08-22 ENCOUNTER — Other Ambulatory Visit (INDEPENDENT_AMBULATORY_CARE_PROVIDER_SITE_OTHER): Payer: Self-pay | Admitting: *Deleted

## 2017-09-23 ENCOUNTER — Other Ambulatory Visit: Payer: Self-pay

## 2017-09-23 ENCOUNTER — Encounter (HOSPITAL_COMMUNITY)
Admission: RE | Admit: 2017-09-23 | Discharge: 2017-09-23 | Disposition: A | Discharge status: Home / Self Care | Payer: 59 | Source: Ambulatory Visit | Attending: Neurosurgery | Admitting: Neurosurgery

## 2017-09-23 ENCOUNTER — Encounter (HOSPITAL_COMMUNITY): Payer: Self-pay

## 2017-09-23 HISTORY — DX: Personal history of urinary calculi: Z87.442

## 2017-09-23 LAB — BASIC METABOLIC PANEL
ANION GAP: 9 (ref 5–15)
BUN: 9 mg/dL (ref 6–20)
CALCIUM: 9.5 mg/dL (ref 8.9–10.3)
CO2: 24 mmol/L (ref 22–32)
Chloride: 105 mmol/L (ref 101–111)
Creatinine, Ser: 1.05 mg/dL (ref 0.61–1.24)
Glucose, Bld: 89 mg/dL (ref 65–99)
Potassium: 4.2 mmol/L (ref 3.5–5.1)
Sodium: 138 mmol/L (ref 135–145)

## 2017-09-23 LAB — CBC WITH DIFFERENTIAL/PLATELET
BASOS ABS: 0 10*3/uL (ref 0.0–0.1)
BASOS PCT: 0 %
Eosinophils Absolute: 0.2 10*3/uL (ref 0.0–0.7)
Eosinophils Relative: 3 %
HCT: 42.3 % (ref 39.0–52.0)
Hemoglobin: 14.4 g/dL (ref 13.0–17.0)
Lymphocytes Relative: 37 %
Lymphs Abs: 2.6 10*3/uL (ref 0.7–4.0)
MCH: 30.9 pg (ref 26.0–34.0)
MCHC: 34 g/dL (ref 30.0–36.0)
MCV: 90.8 fL (ref 78.0–100.0)
Monocytes Absolute: 0.5 10*3/uL (ref 0.1–1.0)
Monocytes Relative: 7 %
NEUTROS ABS: 3.7 10*3/uL (ref 1.7–7.7)
NEUTROS PCT: 53 %
Platelets: 177 10*3/uL (ref 150–400)
RBC: 4.66 MIL/uL (ref 4.22–5.81)
RDW: 12.8 % (ref 11.5–15.5)
WBC: 7 10*3/uL (ref 4.0–10.5)

## 2017-09-23 LAB — SURGICAL PCR SCREEN
MRSA, PCR: NEGATIVE
STAPHYLOCOCCUS AUREUS: NEGATIVE

## 2017-09-23 NOTE — Pre-Procedure Instructions (Signed)
Eduardo [name] Mclin  09/23/2017      [facility] - [place], [place] - [place] [address] [place] [address] Phone: [phone] Fax: [phone]    Your procedure is scheduled on  09/26/2017.  Report to [facility] Admitting at 6:00 A.M.  Call this number if you have problems the morning of surgery:  [phone]   Remember:  Do not eat food or drink liquids after midnight. ON Thursday    Take these medicines the morning of surgery with A SIP OF WATER: as needed take nasal spray &/or Zantac  Starting NOW: NO aspirin, antiinflammatory medicine such as aleve, advil, ibuprofen, fish oil   Do not wear jewelry   Do not wear lotions, powders, or perfumes, or deoderant.    Men may shave face and neck.   Do not bring valuables to the hospital.   [facility] is not responsible for any belongings or valuables.  Contacts, dentures or bridgework may not be worn into surgery.  Leave your suitcase in the car.  After surgery it may be brought to your room.  For patients admitted to the hospital, discharge time will be determined by your treatment team.  Patients discharged the day of surgery will not be allowed to drive home.   Name and phone number of your driver:   With wife   Special instructions: Special Instructions: [place] - Preparing for Surgery  Before surgery, you can play an important role.  Because skin is not sterile, your skin needs to be as free of germs as possible.  You can reduce the number of germs on you skin by washing with CHG (chlorahexidine gluconate) soap before surgery.  CHG is an antiseptic cleaner which kills germs and bonds with the skin to continue killing germs even after washing.  Please DO NOT use if you have an allergy to CHG or antibacterial soaps.  If your skin becomes reddened/irritated stop using the CHG and inform your nurse when you arrive at Short Stay.  Do not shave (including legs and  underarms) for at least 48 hours prior to the first CHG shower.  You may shave your face.  Please follow these instructions carefully:   1.  Shower with CHG Soap the night before surgery and the  morning of Surgery.  2.  If you choose to wash your hair, wash your hair first as usual with your  normal shampoo.  3.  After you shampoo, rinse your hair and body thoroughly to remove the  Shampoo.  4.  Use CHG as you would any other liquid soap.  You can apply chg directly to the skin and wash gently with scrungie or a clean washcloth.  5.  Apply the CHG Soap to your body ONLY FROM THE NECK DOWN.    Do not use on open wounds or open sores.  Avoid contact with your eyes, ears, mouth and genitals (private parts).  Wash genitals (private parts)   with your normal soap.  6.  Wash thoroughly, paying special attention to the area where your surgery will be performed.  7.  Thoroughly rinse your body with warm water from the neck down.  8.  DO NOT shower/wash with your normal soap after using and rinsing off   the CHG Soap.  9.  Pat yourself dry with a clean towel.            10.  Wear clean pajamas.  11.  Place clean sheets on your bed the night of your first shower and do not sleep with pets.  Day of Surgery  Do not apply any lotions/deodorants the morning of surgery.  Please wear clean clothes to the hospital/surgery center.  Please read over the following fact sheets that you were given. Pain Booklet, Coughing and Deep Breathing, MRSA Information and Surgical Site Infection Prevention

## 2017-09-23 NOTE — Progress Notes (Signed)
Pt. Denies all chest/ breathing concerns. Pt. Seen by Dr. Gerarda Fraction for PCP.  Pt. Denies ever being referred to cardiologist,  Denies any cardiac surveillance in his past.

## 2017-09-26 ENCOUNTER — Inpatient Hospital Stay (HOSPITAL_COMMUNITY): Payer: 59

## 2017-09-26 ENCOUNTER — Inpatient Hospital Stay (HOSPITAL_COMMUNITY)
Admission: RE | Admit: 2017-09-26 | Discharge: 2017-09-27 | DRG: 473 | Disposition: A | Discharge status: Home / Self Care | Payer: 59 | Source: Ambulatory Visit | Attending: Neurosurgery | Admitting: Neurosurgery

## 2017-09-26 ENCOUNTER — Inpatient Hospital Stay (HOSPITAL_COMMUNITY)
Admission: RE | Disposition: A | Discharge status: Home / Self Care | Payer: Self-pay | Source: Ambulatory Visit | Attending: Neurosurgery

## 2017-09-26 ENCOUNTER — Encounter (HOSPITAL_COMMUNITY): Payer: Self-pay | Admitting: *Deleted

## 2017-09-26 ENCOUNTER — Encounter (INDEPENDENT_AMBULATORY_CARE_PROVIDER_SITE_OTHER): Payer: Self-pay | Admitting: *Deleted

## 2017-09-26 ENCOUNTER — Telehealth (INDEPENDENT_AMBULATORY_CARE_PROVIDER_SITE_OTHER): Payer: Self-pay | Admitting: *Deleted

## 2017-09-26 DIAGNOSIS — M4802 Spinal stenosis, cervical region: Secondary | ICD-10-CM | POA: Diagnosis present

## 2017-09-26 DIAGNOSIS — Z87442 Personal history of urinary calculi: Secondary | ICD-10-CM

## 2017-09-26 DIAGNOSIS — Z85038 Personal history of other malignant neoplasm of large intestine: Secondary | ICD-10-CM

## 2017-09-26 DIAGNOSIS — M4722 Other spondylosis with radiculopathy, cervical region: Principal | ICD-10-CM | POA: Diagnosis present

## 2017-09-26 DIAGNOSIS — Z85828 Personal history of other malignant neoplasm of skin: Secondary | ICD-10-CM | POA: Diagnosis not present

## 2017-09-26 DIAGNOSIS — K219 Gastro-esophageal reflux disease without esophagitis: Secondary | ICD-10-CM | POA: Diagnosis present

## 2017-09-26 HISTORY — PX: ANTERIOR CERVICAL DECOMP/DISCECTOMY FUSION: SHX1161

## 2017-09-26 LAB — ABO/RH: ABO/RH(D): O POS

## 2017-09-26 LAB — TYPE AND SCREEN
ABO/RH(D): O POS
Antibody Screen: NEGATIVE

## 2017-09-26 SURGERY — ANTERIOR CERVICAL DECOMPRESSION/DISCECTOMY FUSION 3 LEVELS
Anesthesia: General

## 2017-09-26 MED ORDER — FENTANYL CITRATE (PF) 100 MCG/2ML IJ SOLN
25.0000 ug | INTRAMUSCULAR | Status: DC | PRN
  Administered 2017-09-26 (×3): 50 ug via INTRAVENOUS

## 2017-09-26 MED ORDER — SODIUM CHLORIDE 0.9 % IR SOLN
Status: DC | PRN
  Administered 2017-09-26: 09:00:00

## 2017-09-26 MED ORDER — CHLORHEXIDINE GLUCONATE CLOTH 2 % EX PADS: 6.0000 | MEDICATED_PAD | Freq: Once | CUTANEOUS | Status: DC

## 2017-09-26 MED ORDER — DEXAMETHASONE SODIUM PHOSPHATE 10 MG/ML IJ SOLN
INTRAMUSCULAR | Status: AC
  Filled 2017-09-26: qty 1

## 2017-09-26 MED ORDER — ACETAMINOPHEN 650 MG RE SUPP: 650.0000 mg | RECTAL | Status: DC | PRN

## 2017-09-26 MED ORDER — DEXAMETHASONE SODIUM PHOSPHATE 10 MG/ML IJ SOLN
10.0000 mg | INTRAMUSCULAR | Status: AC
  Administered 2017-09-26: 10 mg via INTRAVENOUS
  Filled 2017-09-26: qty 1

## 2017-09-26 MED ORDER — PROPOFOL 10 MG/ML IV BOLUS
INTRAVENOUS | Status: AC
  Filled 2017-09-26: qty 20

## 2017-09-26 MED ORDER — CYCLOBENZAPRINE HCL 10 MG PO TABS
10.0000 mg | ORAL_TABLET | Freq: Three times a day (TID) | ORAL | Status: DC | PRN
  Administered 2017-09-26 – 2017-09-27 (×3): 10 mg via ORAL
  Filled 2017-09-26 (×2): qty 1

## 2017-09-26 MED ORDER — THROMBIN (RECOMBINANT) 20000 UNITS EX SOLR
CUTANEOUS | Status: AC
  Filled 2017-09-26: qty 20000

## 2017-09-26 MED ORDER — FAMOTIDINE 20 MG PO TABS
20.0000 mg | ORAL_TABLET | Freq: Two times a day (BID) | ORAL | Status: DC
  Administered 2017-09-26 – 2017-09-27 (×2): 20 mg via ORAL
  Filled 2017-09-26 (×3): qty 1

## 2017-09-26 MED ORDER — ONDANSETRON HCL 4 MG/2ML IJ SOLN: 4.0000 mg | Freq: Four times a day (QID) | INTRAMUSCULAR | Status: DC | PRN

## 2017-09-26 MED ORDER — CYCLOBENZAPRINE HCL 10 MG PO TABS
ORAL_TABLET | ORAL | Status: AC
  Filled 2017-09-26: qty 1

## 2017-09-26 MED ORDER — PHENOL 1.4 % MT LIQD
1.0000 | OROMUCOSAL | Status: DC | PRN
  Filled 2017-09-26: qty 177

## 2017-09-26 MED ORDER — PHENYLEPHRINE HCL 10 MG/ML IJ SOLN
INTRAVENOUS | Status: DC | PRN
  Administered 2017-09-26: 10 ug/min via INTRAVENOUS

## 2017-09-26 MED ORDER — FLUTICASONE PROPIONATE 50 MCG/ACT NA SUSP
1.0000 | Freq: Three times a day (TID) | NASAL | Status: DC | PRN
  Filled 2017-09-26: qty 16

## 2017-09-26 MED ORDER — PROPOFOL 10 MG/ML IV BOLUS
INTRAVENOUS | Status: DC | PRN
  Administered 2017-09-26: 150 mg via INTRAVENOUS

## 2017-09-26 MED ORDER — 0.9 % SODIUM CHLORIDE (POUR BTL) OPTIME
TOPICAL | Status: DC | PRN
  Administered 2017-09-26: 1000 mL

## 2017-09-26 MED ORDER — CEFAZOLIN SODIUM-DEXTROSE 2-4 GM/100ML-% IV SOLN
2.0000 g | INTRAVENOUS | Status: AC
  Administered 2017-09-26: 2 g via INTRAVENOUS
  Filled 2017-09-26: qty 100

## 2017-09-26 MED ORDER — THROMBIN (RECOMBINANT) 5000 UNITS EX SOLR
CUTANEOUS | Status: DC | PRN
  Administered 2017-09-26 (×2): via TOPICAL

## 2017-09-26 MED ORDER — FENTANYL CITRATE (PF) 100 MCG/2ML IJ SOLN
INTRAMUSCULAR | Status: AC
Start: 2017-09-26 — End: 2017-09-26
  Filled 2017-09-26: qty 2

## 2017-09-26 MED ORDER — SUGAMMADEX SODIUM 200 MG/2ML IV SOLN
INTRAVENOUS | Status: DC | PRN
  Administered 2017-09-26: 200 mg via INTRAVENOUS

## 2017-09-26 MED ORDER — CEFAZOLIN SODIUM-DEXTROSE 1-4 GM/50ML-% IV SOLN
1.0000 g | Freq: Three times a day (TID) | INTRAVENOUS | Status: AC
  Administered 2017-09-26 – 2017-09-27 (×2): 1 g via INTRAVENOUS
  Filled 2017-09-26 (×2): qty 50

## 2017-09-26 MED ORDER — HYDROCODONE-ACETAMINOPHEN 5-325 MG PO TABS: 1.0000 | ORAL_TABLET | ORAL | Status: DC | PRN

## 2017-09-26 MED ORDER — HYDROCODONE-ACETAMINOPHEN 10-325 MG PO TABS
2.0000 | ORAL_TABLET | ORAL | Status: DC | PRN
  Administered 2017-09-26 – 2017-09-27 (×6): 2 via ORAL
  Filled 2017-09-26 (×6): qty 2

## 2017-09-26 MED ORDER — THROMBIN (RECOMBINANT) 5000 UNITS EX SOLR
CUTANEOUS | Status: AC
  Filled 2017-09-26: qty 5000

## 2017-09-26 MED ORDER — MENTHOL 3 MG MT LOZG
1.0000 | LOZENGE | OROMUCOSAL | Status: DC | PRN
  Filled 2017-09-26: qty 9

## 2017-09-26 MED ORDER — FENTANYL CITRATE (PF) 250 MCG/5ML IJ SOLN
INTRAMUSCULAR | Status: AC
  Filled 2017-09-26: qty 5

## 2017-09-26 MED ORDER — MIDAZOLAM HCL 5 MG/5ML IJ SOLN
INTRAMUSCULAR | Status: DC | PRN
  Administered 2017-09-26: 2 mg via INTRAVENOUS

## 2017-09-26 MED ORDER — SUGAMMADEX SODIUM 200 MG/2ML IV SOLN
INTRAVENOUS | Status: AC
  Filled 2017-09-26: qty 2

## 2017-09-26 MED ORDER — ONDANSETRON HCL 4 MG/2ML IJ SOLN: 4.0000 mg | Freq: Once | INTRAMUSCULAR | Status: DC | PRN

## 2017-09-26 MED ORDER — SODIUM CHLORIDE 0.9% FLUSH
3.0000 mL | Freq: Two times a day (BID) | INTRAVENOUS | Status: DC
  Administered 2017-09-26: 3 mL via INTRAVENOUS

## 2017-09-26 MED ORDER — THROMBIN (RECOMBINANT) 20000 UNITS EX SOLR
CUTANEOUS | Status: DC | PRN
  Administered 2017-09-26 (×2): via TOPICAL

## 2017-09-26 MED ORDER — ONDANSETRON HCL 4 MG/2ML IJ SOLN
INTRAMUSCULAR | Status: AC
  Filled 2017-09-26: qty 2

## 2017-09-26 MED ORDER — LIDOCAINE 2% (20 MG/ML) 5 ML SYRINGE
INTRAMUSCULAR | Status: AC
  Filled 2017-09-26: qty 5

## 2017-09-26 MED ORDER — LACTATED RINGERS IV SOLN
INTRAVENOUS | Status: DC
  Administered 2017-09-26 (×3): via INTRAVENOUS

## 2017-09-26 MED ORDER — LIDOCAINE HCL (CARDIAC) 20 MG/ML IV SOLN
INTRAVENOUS | Status: DC | PRN
  Administered 2017-09-26: 100 mg via INTRAVENOUS

## 2017-09-26 MED ORDER — SUMATRIPTAN SUCCINATE 100 MG PO TABS: 100.0000 mg | ORAL_TABLET | ORAL | Status: DC | PRN

## 2017-09-26 MED ORDER — ONDANSETRON HCL 4 MG/2ML IJ SOLN
INTRAMUSCULAR | Status: DC | PRN
  Administered 2017-09-26: 4 mg via INTRAVENOUS

## 2017-09-26 MED ORDER — ONDANSETRON HCL 4 MG PO TABS: 4.0000 mg | ORAL_TABLET | Freq: Four times a day (QID) | ORAL | Status: DC | PRN

## 2017-09-26 MED ORDER — ALBUMIN HUMAN 5 % IV SOLN
INTRAVENOUS | Status: DC | PRN
  Administered 2017-09-26: 10:00:00 via INTRAVENOUS

## 2017-09-26 MED ORDER — SODIUM CHLORIDE 0.9% FLUSH: 3.0000 mL | INTRAVENOUS | Status: DC | PRN

## 2017-09-26 MED ORDER — MIDAZOLAM HCL 2 MG/2ML IJ SOLN
INTRAMUSCULAR | Status: AC
  Filled 2017-09-26: qty 2

## 2017-09-26 MED ORDER — FENTANYL CITRATE (PF) 100 MCG/2ML IJ SOLN
INTRAMUSCULAR | Status: DC | PRN
  Administered 2017-09-26 (×2): 100 ug via INTRAVENOUS
  Administered 2017-09-26: 25 ug via INTRAVENOUS
  Administered 2017-09-26: 50 ug via INTRAVENOUS
  Administered 2017-09-26: 25 ug via INTRAVENOUS

## 2017-09-26 MED ORDER — ROCURONIUM BROMIDE 10 MG/ML (PF) SYRINGE
PREFILLED_SYRINGE | INTRAVENOUS | Status: AC
  Filled 2017-09-26: qty 5

## 2017-09-26 MED ORDER — ACETAMINOPHEN 325 MG PO TABS: 650.0000 mg | ORAL_TABLET | ORAL | Status: DC | PRN

## 2017-09-26 MED ORDER — ROCURONIUM BROMIDE 100 MG/10ML IV SOLN
INTRAVENOUS | Status: DC | PRN
  Administered 2017-09-26: 20 mg via INTRAVENOUS
  Administered 2017-09-26: 50 mg via INTRAVENOUS
  Administered 2017-09-26: 30 mg via INTRAVENOUS
  Administered 2017-09-26 (×2): 20 mg via INTRAVENOUS

## 2017-09-26 MED ORDER — FENTANYL CITRATE (PF) 100 MCG/2ML IJ SOLN
INTRAMUSCULAR | Status: AC
  Administered 2017-09-26: 50 ug via INTRAVENOUS
  Filled 2017-09-26: qty 2

## 2017-09-26 MED ORDER — HYDROMORPHONE HCL 1 MG/ML IJ SOLN: 1.0000 mg | INTRAMUSCULAR | Status: DC | PRN

## 2017-09-26 SURGICAL SUPPLY — 64 items
APL SKNCLS STERI-STRIP NONHPOA (GAUZE/BANDAGES/DRESSINGS) ×1
BAG DECANTER FOR FLEXI CONT (MISCELLANEOUS) ×3 IMPLANT
BENZOIN TINCTURE PRP APPL 2/3 (GAUZE/BANDAGES/DRESSINGS) ×3 IMPLANT
BIT DRILL 13 (BIT) ×1 IMPLANT
BIT DRILL 13MM (BIT) ×1
BUR MATCHSTICK NEURO 3.0 LAGG (BURR) ×3 IMPLANT
CAGE PEEK 7X14X11 (Cage) ×3 IMPLANT
CANISTER SUCT 3000ML PPV (MISCELLANEOUS) ×3 IMPLANT
CARTRIDGE OIL MAESTRO DRILL (MISCELLANEOUS) ×1 IMPLANT
CLOSURE WOUND 1/2 X4 (GAUZE/BANDAGES/DRESSINGS) ×1
DIFFUSER DRILL AIR PNEUMATIC (MISCELLANEOUS) ×3 IMPLANT
DRAPE C-ARM 42X72 X-RAY (DRAPES) ×6 IMPLANT
DRAPE LAPAROTOMY 100X72 PEDS (DRAPES) ×3 IMPLANT
DRAPE MICROSCOPE LEICA (MISCELLANEOUS) ×3 IMPLANT
DRAPE POUCH INSTRU U-SHP 10X18 (DRAPES) ×3 IMPLANT
DRSG OPSITE POSTOP 3X4 (GAUZE/BANDAGES/DRESSINGS) ×2 IMPLANT
DURAPREP 6ML APPLICATOR 50/CS (WOUND CARE) ×3 IMPLANT
ELECT COATED BLADE 2.86 ST (ELECTRODE) ×3 IMPLANT
ELECT REM PT RETURN 9FT ADLT (ELECTROSURGICAL) ×3
ELECTRODE REM PT RTRN 9FT ADLT (ELECTROSURGICAL) ×1 IMPLANT
EVACUATOR 3/16  PVC DRAIN (DRAIN) ×2
EVACUATOR 3/16 PVC DRAIN (DRAIN) IMPLANT
GAUZE SPONGE 4X4 12PLY STRL (GAUZE/BANDAGES/DRESSINGS) ×3 IMPLANT
GAUZE SPONGE 4X4 16PLY XRAY LF (GAUZE/BANDAGES/DRESSINGS) ×2 IMPLANT
GLOVE ECLIPSE 9.0 STRL (GLOVE) ×3 IMPLANT
GLOVE EXAM NITRILE LRG STRL (GLOVE) IMPLANT
GLOVE EXAM NITRILE XL STR (GLOVE) IMPLANT
GLOVE EXAM NITRILE XS STR PU (GLOVE) IMPLANT
GOWN STRL REUS W/ TWL LRG LVL3 (GOWN DISPOSABLE) IMPLANT
GOWN STRL REUS W/ TWL XL LVL3 (GOWN DISPOSABLE) IMPLANT
GOWN STRL REUS W/TWL 2XL LVL3 (GOWN DISPOSABLE) IMPLANT
GOWN STRL REUS W/TWL LRG LVL3 (GOWN DISPOSABLE)
GOWN STRL REUS W/TWL XL LVL3 (GOWN DISPOSABLE)
HALTER HD/CHIN CERV TRACTION D (MISCELLANEOUS) ×3 IMPLANT
HEMOSTAT POWDER KIT SURGIFOAM (HEMOSTASIS) ×4 IMPLANT
KIT BASIN OR (CUSTOM PROCEDURE TRAY) ×3 IMPLANT
KIT ROOM TURNOVER OR (KITS) ×3 IMPLANT
NDL SPNL 20GX3.5 QUINCKE YW (NEEDLE) ×1 IMPLANT
NEEDLE SPNL 20GX3.5 QUINCKE YW (NEEDLE) ×3 IMPLANT
NS IRRIG 1000ML POUR BTL (IV SOLUTION) ×3 IMPLANT
OIL CARTRIDGE MAESTRO DRILL (MISCELLANEOUS) ×3
PACK LAMINECTOMY NEURO (CUSTOM PROCEDURE TRAY) ×3 IMPLANT
PAD ARMBOARD 7.5X6 YLW CONV (MISCELLANEOUS) ×9 IMPLANT
PATTIES SURGICAL 1X1 (DISPOSABLE) ×2 IMPLANT
PEEK CAGE 7X14X11 (Cage) ×2 IMPLANT
PEEK CAGE 8X14X11 (Peek) ×2 IMPLANT
PLATE 45MM (Plate) ×3 IMPLANT
PLATE 45XATL VS ELT (Plate) IMPLANT
PLATE ELITE VISION 25MM (Plate) ×2 IMPLANT
RUBBERBAND STERILE (MISCELLANEOUS) ×6 IMPLANT
SCREW 4.5X13MM (Screw) ×2 IMPLANT
SCREW ST FIX 4 ATL 3120213 (Screw) ×18 IMPLANT
SPACER SPNL 11X14X7XPEEK CVD (Cage) IMPLANT
SPCR SPNL 11X14X7XPEEK CVD (Cage) ×1 IMPLANT
SPONGE INTESTINAL PEANUT (DISPOSABLE) ×3 IMPLANT
SPONGE SURGIFOAM ABS GEL 100 (HEMOSTASIS) ×5 IMPLANT
STRIP CLOSURE SKIN 1/2X4 (GAUZE/BANDAGES/DRESSINGS) ×2 IMPLANT
SUT VIC AB 3-0 SH 8-18 (SUTURE) ×3 IMPLANT
SUT VIC AB 4-0 RB1 18 (SUTURE) ×3 IMPLANT
TAPE CLOTH 4X10 WHT NS (GAUZE/BANDAGES/DRESSINGS) ×3 IMPLANT
TOWEL GREEN STERILE (TOWEL DISPOSABLE) ×3 IMPLANT
TOWEL GREEN STERILE FF (TOWEL DISPOSABLE) ×3 IMPLANT
TRAP SPECIMEN MUCOUS 40CC (MISCELLANEOUS) ×3 IMPLANT
WATER STERILE IRR 1000ML POUR (IV SOLUTION) ×3 IMPLANT

## 2017-09-26 NOTE — Transfer of Care (Signed)
Immediate Anesthesia Transfer of Care Note  Patient: Eduardo [name]  Procedure(s) Performed: ACDF - C3-C4 - C5-C6 - C6-C7 (N/A )  Patient Location: PACU  Anesthesia Type:General  Level of Consciousness: awake, alert  and sedated  Airway & Oxygen Therapy: Patient connected to nasal cannula oxygen  Post-op Assessment: Post -op Vital signs reviewed and stable  Post vital signs: stable  Last Vitals:  Vitals:   09/26/17 0611  BP: 136/86  Pulse: 71  Resp: 20  Temp: 37.1 C  SpO2: 96%    Last Pain:  Vitals:   09/26/17 0627  TempSrc:   PainSc: 7          Complications: No apparent anesthesia complications

## 2017-09-26 NOTE — Brief Op Note (Signed)
09/26/2017  10:45 AM  PATIENT:  Eduardo [name]  71 y.o. male  PRE-OPERATIVE DIAGNOSIS:  Stenosis  POST-OPERATIVE DIAGNOSIS:  stenosis  PROCEDURE:  Procedure(s): ACDF - C3-C4 - C5-C6 - C6-C7 (N/A)  SURGEON:  Surgeon(s) and Role:    * Earnie Larsson, MD - Primary    * Kary Kos, MD - Assisting  PHYSICIAN ASSISTANT:   ASSISTANTS:    ANESTHESIA:   general  EBL:  700 mL   BLOOD ADMINISTERED:none  DRAINS: (med) Hemovact drain(s) in the Prevertebral space with  Suction Open   LOCAL MEDICATIONS USED:  MARCAINE     SPECIMEN:  No Specimen  DISPOSITION OF SPECIMEN:  N/A  COUNTS:  YES  TOURNIQUET:  * No tourniquets in log *  DICTATION: .Dragon Dictation  PLAN OF CARE: Admit to inpatient   PATIENT DISPOSITION:  PACU - hemodynamically stable.   Delay start of Pharmacological VTE agent (>24hrs) due to surgical blood loss or risk of bleeding: yes

## 2017-09-26 NOTE — Telephone Encounter (Signed)
Patient needs trilyte 

## 2017-09-26 NOTE — Op Note (Signed)
Date of procedure: 09/26/2017  Date of dictation: Same  Service: Neurosurgery  Preoperative diagnosis: C3-4 spondylosis with radiculopathy  C5-6, C6-7 spondylosis with stenosis  Postoperative diagnosis: Same  Procedure Name: C3-4 anterior cervical discectomy with interbody fusion utilizing interbody peek cage, locally harvested autograft, and anterior plate instrumentation  C5-6, C6-7 anterior cervical discectomy with interbody fusion utilizing interbody peek cages, locally harvested autograft, and anterior plate instrumentation  Surgeon:Shante Archambeault A.Shawnmichael Parenteau, M.D.  Asst. Surgeon: Saintclair Halsted  Anesthesia: General  Indication: 71 year old male with intractable neck pain and upper extremity symptoms failed conservative management.  Workup demonstrates evidence of significant cervical spondylosis worse on the right at C4-5 and bilaterally at C5-6 and C6-7.  Patient presents now for 3 level anterior cervical decompression and fusion.  Operative note: After induction of anesthesia, patient position supine with neck slightly extended and held place holder traction.  Patient's anterior cervical region prepped and draped sterilely.  Incision made overlying C4-5.  Dissection performed on the right retractor placed.  Fluoroscopy used.  Levels confirmed.  Disc space at C3-4 incised with 15 blade.  Discectomy performed using various instruments down to level the posterior annulus.  Microscope was then brought to the field used throughout the remainder of the discectomy.  Remaining aspects of annulus and osteophytes removed with a high-speed drill down to flow of the posterior longitudinal ligament.  Posterior logical is not elevated and resected piecemeal fashion.  Underlying thecal sac was identified.  Wide central decompression then performed by undercutting the bodies of C3 and C4.  Decompression then proceeded into each neural foramen.  Wide anterior foraminotomies were performed on the course exiting C4 nerve roots  bilaterally.  There was copious epidural bleeding at this level which was eventually controlled with Gelfoam.  Wound is then irrigated.  7 mm Medtronic anatomic peek cage packed with locally harvested autograft was then impacted into place and recessed slightly from the anterior cortical margin.  A 25 mm Atlantis anterior cervical plate was then placed over the C3 and C4 levels.  This and attached under fluoroscopic guidance using 13 mm fixed angle screws to each both levels.  Attention was then placed at C5-6 and C6-7.  Once again both the spaces were incised.  Discectomy was then performed using various instruments down below the posterior annulus.  Microscope brought to fill these out the remainder of the scalp.  Remaining aspects of annulus and osteophytes were removed using a high-speed drill to level of the posterior large ligament.  Posterior logical visit elevated and resected.  Wide central decompression then performed undercutting the bodies of C5 and C6.  Decompression then proceeded into each neural foramen.  Wide anterior foraminotomies were performed on the course exiting C6 nerve roots bilaterally.  At this point a very thorough decompression been achieved.  There was no evidence of injury to thecal sac or nerve roots.  Procedure was then repeated at C6-7 again without complication.  Medtronic anatomic peek cages were once again packed with bone harvested autograft and impacted into place.  Each cage was recessed slightly from the anterior cortical margin.  45 mm Atlantis anterior cervical plate was then placed of the C5, C6 and C7 levels.  This then attached under fluoroscopic guidance using 13 mm fixed angle screws to each in each level.  All 6 screws and final tightening found to be solid within the bone.  Locking screws engaged all 3 levels.  Final images revealed good position of bone graft and hardware proper level with normal alignment  of spine.  Wound is irrigated one final time.  Hemostasis  was assured with the bipolar cautery.  Hemovac drain was left in the prevertebral space.  Wound is then closed in typical fashion.  Steri-Strips and sterile dressing were applied.  No apparent complications.  Patient tolerated the procedure well and he returns to the recovery room postop

## 2017-09-26 NOTE — Anesthesia Postprocedure Evaluation (Signed)
Anesthesia Post Note  Patient: Eduardo [name]  Procedure(s) Performed: ACDF - C3-C4 - C5-C6 - C6-C7 (N/A )     Patient location during evaluation: PACU Anesthesia Type: General Level of consciousness: awake and alert Pain management: pain level controlled Vital Signs Assessment: post-procedure vital signs reviewed and stable Respiratory status: spontaneous breathing, nonlabored ventilation and respiratory function stable Cardiovascular status: blood pressure returned to baseline and stable Postop Assessment: no apparent nausea or vomiting Anesthetic complications: no    Last Vitals:  Vitals:   09/26/17 1243 09/26/17 1304  BP: 129/75 137/85  Pulse: 100 98  Resp: 16 16  Temp:  36.7 C  SpO2: 96% 98%    Last Pain:  Vitals:   09/26/17 1340  TempSrc:   PainSc: 10-Worst pain ever                 [name]

## 2017-09-26 NOTE — H&P (Signed)
Eduardo [name] is an 72 y.o. male.   Chief Complaint: Neck pain HPI: Patient is a 71 year old male with chronic and progressive neck pain with radiation into his upper extremities right greater than left.  Patient has failed conservative management.  Workup demonstrates evidence of marked spondylosis with severe foraminal stenosis at C3-4 on the right as well as marked spondylosis and stenosis at C5-6 and C6-7.  Patient presents now for 3 levels of anterior cervical decompression and fusion in hopes of improving his symptoms.  Past Medical History:  Diagnosis Date  . Colon cancer ([place]) 10/20/13   colon resection-   . DDD (degenerative disc disease), cervical   . GERD (gastroesophageal reflux disease)   . History of kidney stones    seen on imaging but never knowingly passed spontaneously. Never had any manipulation surgery/procedure for it.   Marland Kitchen History of seasonal allergies   . Kidney stone   . Kidney stones   . Migraine    "maybe 1-2/month" (06/20/2015)  . Neuropathy 2010  . Skin cancer of face    "froze them off"  . Wears glasses     Past Surgical History:  Procedure Laterality Date  . APPENDECTOMY  10/20/13   (with right hemicolectomy)  . CARPAL TUNNEL RELEASE Left 2011  . CARPAL TUNNEL RELEASE Right 01/07/2013   Procedure: CARPAL TUNNEL RELEASE;  Surgeon: Tennis Must, MD;  Location: [facility];  Service: Orthopedics;  Laterality: Right;  . COLON RESECTION N/A 10/20/2013   Procedure:  LAPAROSCOPIC hand assisted partial colectomy;  Surgeon: Jamesetta So, MD;  Location: [facility];  Service: General;  Laterality: N/A;  . COLON SURGERY  2014   x2- 2nd. to correct a perforation , treated with TPN & drain & then later had to remove more colon   . COLONOSCOPY    . COLONOSCOPY N/A 10/18/2013   Procedure: COLONOSCOPY;  Surgeon: Danie Binder, MD;  [facility];  Service: Endoscopy;  Laterality: N/A;  2:00-moved to 1030 Pam to notify pt  . COLONOSCOPY N/A  09/22/2014   Procedure: COLONOSCOPY;  Surgeon: Rogene Houston, MD;  [facility];  Service: Endoscopy;  Laterality: N/A;  1200  . ESOPHAGOGASTRODUODENOSCOPY N/A 06/19/2016   Procedure: ESOPHAGOGASTRODUODENOSCOPY (EGD);  Surgeon: Rogene Houston, MD;  [facility];  Service: Endoscopy;  Laterality: N/A;  3:00  . EXCISIONAL HEMORRHOIDECTOMY    . INCISIONAL HERNIA REPAIR  06/20/2015   WITH MYOFASCIAL ADVANCEMENT FLAP AND MESH   . INCISIONAL HERNIA REPAIR N/A 06/20/2015   Procedure: INCISIONAL HERNIA REPAIR WITH MYOFASCIAL ADVANCEMENT FLAP AND MESH;  Surgeon: Erroll Luna, MD;  Location: [facility];  Service: General;  Laterality: N/A;  . INGUINAL HERNIA REPAIR Bilateral    "[place] [facility]"  . INGUINAL HERNIA REPAIR Left    "@ [facility]"  . INSERTION OF MESH N/A 06/20/2015   Procedure: INSERTION OF MESH;  Surgeon: Erroll Luna, MD;  Location: [facility];  Service: General;  Laterality: N/A;  . KNEE ARTHROSCOPY Bilateral 2001  . NASAL SEPTUM SURGERY  1980's  . TAKE DOWN OF INTESTINAL FISTULA N/A 02/02/2014   Procedure: TAKE DOWN OF ENTEROCUTANEOUS  FISTULA;  Surgeon: Marcello Moores A. Cornett, MD;  Location: [facility];  Service: General;  Laterality: N/A;  . TONSILLECTOMY    . UPPER GI ENDOSCOPY      Family History  Problem Relation Age of Onset  . Colon polyps Father   . Colon polyps Paternal Uncle   .  Colon polyps Maternal Grandfather    Social History:  reports that  has never smoked. he has never used smokeless tobacco. He reports that he does not drink alcohol or use drugs.  Allergies:  Allergies  Allergen Reactions  . Niaspan [Niacin Er] Rash  . Tape Rash and Other (See Comments)    Paper tape OK    Medications Prior to Admission  Medication Sig Dispense Refill  . Cyanocobalamin (VITAMIN B-12 IJ) Inject as directed every 30 (thirty) days. Gets at Dr office.  Patient dose not know the dosage    . EPINEPHrine 0.3 mg/0.3 mL IJ SOAJ injection Inject 0.3 mLs (0.3 mg total) into the  muscle once. (Patient taking differently: Inject 0.3 mg into the muscle once. ) 1 Device 0  . fluticasone (FLONASE) 50 MCG/ACT nasal spray Place 1 spray into both nostrils 3 (three) times daily as needed for allergies.     Marland Kitchen HYDROcodone-acetaminophen (NORCO/VICODIN) 5-325 MG per tablet Take 1 tablet by mouth every 6 (six) hours as needed for moderate pain.   0  . Probiotic Product (PROBIOTIC DAILY PO) Take 1 capsule by mouth daily.     . ranitidine (ZANTAC) 300 MG tablet Take 300 mg by mouth daily as needed for heartburn.    . Cetirizine HCl (ZYRTEC ALLERGY) 10 MG CAPS Take 1 capsule (10 mg total) by mouth daily as needed. (Patient not taking: Reported on 09/18/2017) 10 capsule 0  . ondansetron (ZOFRAN ODT) 4 MG disintegrating tablet Take 1 tablet (4 mg total) by mouth every 8 (eight) hours as needed for nausea or vomiting. (Patient not taking: Reported on 09/18/2017) 15 tablet 0  . SUMAtriptan (IMITREX) 100 MG tablet Take 100 mg by mouth every 2 (two) hours as needed for migraine. May repeat in 2 hours if headache persists or recurs.    . tamsulosin (FLOMAX) 0.4 MG CAPS capsule Take 1 capsule (0.4 mg total) by mouth daily. (Patient not taking: Reported on 09/18/2017) 30 capsule 2    No results found for this or any previous visit (from the past 48 hour(s)). No results found.  Pertinent items noted in HPI and remainder of comprehensive ROS otherwise negative.  Blood pressure 136/86, pulse 71, temperature 98.8 F (37.1 C), temperature source Oral, resp. rate 20, height 6\' 2"  (1.88 m), weight 99.8 kg (220 lb), SpO2 96 %.  Patient is awake and alert.  He is oriented and appropriate.  Speech is fluent.  Judgment and insight are intact.  Cranial nerve function normal bilaterally.  Motor examination extremities without significant weakness.  Sensory examination with some patchy distal sensory loss in the right upper extremity.  Reflexes are hypoactive but symmetric.  No evidence of long tract signs.   Gait is normal.  Termination head ears eyes nose throat is unremarkable her chest and abdomen are benign.  Extremities are free from injury deformity.  His neck has a normal appearing airway.  Carotid pulses are normal bilaterally. Assessment/Plan C3-4 spondylosis and radiculopathy, C5-6, C6-7 spondylosis with stenosis.  Plan C3-4 anterior cervical discectomy with interbody fusion and anterior plate instrumentation and C5-6 and C6-7 anterior cervical discectomy with interbody fusion utilizing interbody cage, locally harvested autograft, and anterior plate is mentation.  Risks and benefits of been explained.  Patient wishes to proceed.  Mallie Mussel A Madolyn Ackroyd 09/26/2017, 7:44 AM

## 2017-09-26 NOTE — Anesthesia Procedure Notes (Signed)
Procedure Name: Intubation Date/Time: 09/26/2017 8:06 AM Performed by: [name], MD Pre-anesthesia Checklist: Patient identified, Emergency Drugs available, Suction available and Patient being monitored Patient Re-evaluated:Patient Re-evaluated prior to induction Oxygen Delivery Method: Circle system utilized Preoxygenation: Pre-oxygenation with 100% oxygen Induction Type: IV induction Ventilation: Mask ventilation without difficulty Laryngoscope Size: Glidescope Grade View: Grade I Tube type: Oral Tube size: 7.5 mm Number of attempts: 1 Airway Equipment and Method: Stylet and Video-laryngoscopy Placement Confirmation: ETT inserted through vocal cords under direct vision,  positive ETCO2 and breath sounds checked- equal and bilateral Secured at: 23 cm Tube secured with: Tape Dental Injury: Injury to lip  Comments: Intubation per Vickii Penna, SRNA.

## 2017-09-26 NOTE — Anesthesia Preprocedure Evaluation (Addendum)
Anesthesia Evaluation  Patient identified by MRN, date of birth, ID band Patient awake    Reviewed: Allergy & Precautions, NPO status , Patient's Chart, lab work & pertinent test results  Airway Mallampati: II  TM Distance: <3 FB Neck ROM: Full    Dental  (+) Teeth Intact, Dental Advisory Given   Pulmonary neg pulmonary ROS,    Pulmonary exam normal breath sounds clear to auscultation       Cardiovascular Exercise Tolerance: Good negative cardio ROS Normal cardiovascular exam Rhythm:Regular Rate:Normal     Neuro/Psych  Headaches, DDD RUE weakness, paresthesias negative psych ROS   GI/Hepatic Neg liver ROS, GERD  Medicated,Colon cancer   Endo/Other  negative endocrine ROS  Renal/GU negative Renal ROS     Musculoskeletal  (+) Arthritis , Osteoarthritis,    Abdominal   Peds  Hematology negative hematology ROS (+)   Anesthesia Other Findings Day of surgery medications reviewed with the patient.  Reproductive/Obstetrics                            Anesthesia Physical Anesthesia Plan  ASA: II  Anesthesia Plan: General   Post-op Pain Management:    Induction: Intravenous  PONV Risk Score and Plan: 2 and Dexamethasone and Ondansetron  Airway Management Planned: Oral ETT and Video Laryngoscope Planned  Additional Equipment:   Intra-op Plan:   Post-operative Plan: Extubation in OR  Informed Consent: I have reviewed the patients History and Physical, chart, labs and discussed the procedure including the risks, benefits and alternatives for the proposed anesthesia with the patient or authorized representative who has indicated his/her understanding and acceptance.   Dental advisory given  Plan Discussed with: CRNA  Anesthesia Plan Comments: (Risks/benefits of general anesthesia discussed with patient including risk of damage to teeth, lips, gum, and tongue, nausea/vomiting, allergic  reactions to medications, and the possibility of heart attack, stroke and death.  All patient questions answered.  Patient wishes to proceed.)        Anesthesia Quick Evaluation

## 2017-09-27 ENCOUNTER — Other Ambulatory Visit: Payer: Self-pay

## 2017-09-27 LAB — POCT I-STAT 4, (NA,K, GLUC, HGB,HCT)
GLUCOSE: 109 mg/dL — AB (ref 65–99)
HEMATOCRIT: 34 % — AB (ref 39.0–52.0)
Hemoglobin: 11.6 g/dL — ABNORMAL LOW (ref 13.0–17.0)
POTASSIUM: 4.2 mmol/L (ref 3.5–5.1)
Sodium: 140 mmol/L (ref 135–145)

## 2017-09-27 MED ORDER — CYCLOBENZAPRINE HCL 10 MG PO TABS: 10.0000 mg | ORAL_TABLET | Freq: Three times a day (TID) | ORAL | 0 refills | Status: DC | PRN

## 2017-09-27 MED ORDER — HYDROCODONE-ACETAMINOPHEN 5-325 MG PO TABS: 1.0000 | ORAL_TABLET | ORAL | 0 refills | Status: DC | PRN

## 2017-09-27 NOTE — Progress Notes (Signed)
Patient is discharged from room 3C08 at this time. Alert and in stable condition. IV site d/c'd and instructions read to patient and spouse with understanding verbalized. Ambulate out of unit with all belongings at side.  

## 2017-09-27 NOTE — Discharge Instructions (Signed)
Wound Care Keep incision area dry.  You may remove outer bandage after 2 days and shower.   If you shower prior cover incision with plastic wrap.  Do not put any creams, lotions, or ointments on incision. Leave steri-strips on neck.  They will fall off by themselves. Activity Walk each and every day, increasing distance each day. No lifting greater than 5 lbs.  Avoid excessive neck motion. No driving for 2 weeks; may ride as a passenger locally. Wear neck brace at all times except when showering. Diet Resume your normal diet.  Return to Work Will be discussed at you follow up appointment. Call Your Doctor If Any of These Occur Redness, drainage, or swelling at the wound.  Temperature greater than 101 degrees. Severe pain not relieved by pain medication. Increased difficulty swallowing.  Incision starts to come apart. Follow Up Appt Call today and ask for Wells Guiles for appointment in 1-2 weeks ([phone]) or for problems.  If you have any hardware placed in your spine, you will need an x-ray before your appointment.  Anterior Cervical Diskectomy and Fusion, Care After Refer to this sheet in the next few weeks. These instructions provide you with information about caring for yourself after your procedure. Your health care provider may also give you more specific instructions. Your treatment has been planned according to current medical practices, but problems sometimes occur. Call your health care provider if you have any problems or questions after your procedure. What can I expect after the procedure? After the procedure, it is common to have:  Neck pain.  Discomfort when swallowing.  Slight hoarseness.  Follow these instructions at home: If You Have a Neck Brace:  Wear it as told by your health care provider. Remove it only as told by your health care provider.  Keep the brace clean and dry. Incision care   Follow instructions from your health care provider about how to take  care of the cut made during surgery (incision). Make sure you: ? Wash your hands with soap and water before you change your bandage (dressing). If soap and water are not available, use hand sanitizer. ? Change your dressing as told by your health care provider. ? Leave stitches (sutures), skin glue, or adhesive strips in place. These skin closures may need to stay in place for 2 weeks or longer. If adhesive strip edges start to loosen and curl up, you may trim the loose edges. Do not remove adhesive strips completely unless your health care provider tells you to do that.  Check your incision every day for signs of infection. Watch for: ? Redness, swelling, or pain. ? Fluid or blood. ? Warmth. ? Pus or a bad smell. Managing pain, stiffness, and swelling  Take over-the-counter and prescription medicines only as told by your health care provider.  If directed, apply ice to the injured area. ? Put ice in a plastic bag. ? Place a towel between your skin and the bag. ? Leave the ice on for 20 minutes, 2-3 times per day. Activity  Return to your normal activities as told by your health care provider. Ask your health care provider what activities are safe for you.  Do exercises as told by your health care provider.  Do not lift anything that is heavier than 10 lb (4.5 kg). General instructions  Do not drive or operate heavy machinery while taking prescription pain medicines.  Do not use any tobacco products, including cigarettes, chewing tobacco, or e-cigarettes. Tobacco can delay healing.  If you need help quitting, ask your health care provider.  Keep all follow-up visits and physical therapy appointments as told by your health care provider. This is important. Contact a health care provider if:  You have a fever.  You have redness, swelling, or pain around your incision.  You have fluid or blood coming from your incision.  You have pus or a bad smell coming from your  incision.  You have pain that is not controlled by your pain medicine.  You have increasing hoarseness or trouble swallowing. Get help right away if:  You have severe pain.  You have sudden numbness or weakness in your arms.  You have warmth, tenderness, or swelling in your calf.  You have chest pain.  You have difficulty breathing. This information is not intended to replace advice given to you by your health care provider. Make sure you discuss any questions you have with your health care provider. Document Released: 11/03/2015 Document Revised: 03/14/2016 Document Reviewed: 09/21/2015 Elsevier Interactive Patient Education  [employer].

## 2017-09-27 NOTE — Discharge Summary (Signed)
Physician Discharge Summary  Patient ID: Eduardo [name] MRN: [id number] DOB/AGE: 69-Aug-1947 69 y.o.  Admit date: 09/26/2017 Discharge date: 09/27/2017  Admission Diagnoses: C3-4 spondylosis with radiculopathy   Discharge Diagnoses: same as admitting   Discharged Condition: good  Hospital Course: The patient was admitted on 09/26/2017 and taken to the operating room where the patient underwent ACDF C3-4, C5-6. C6-7. The patient tolerated the procedure well and was taken to the recovery room and then to the floor in stable condition. The hospital course was routine. There were no complications. The wound remained clean dry and intact. Pt had appropriate neck soreness. No complaints of new pain or new N/T/W. The patient remained afebrile with stable vital signs, and tolerated a regular diet. The patient continued to increase activities, and pain was well controlled with oral pain medications.   Consults: None  Significant Diagnostic Studies:  Results for orders placed or performed during the hospital encounter of 09/26/17  I-STAT 4, (NA,K, GLUC, HGB,HCT)  Result Value Ref Range   Sodium 140 135 - 145 mmol/L   Potassium 4.2 3.5 - 5.1 mmol/L   Glucose, Bld 109 (H) 65 - 99 mg/dL   HCT 34.0 (L) 39.0 - 52.0 %   Hemoglobin 11.6 (L) 13.0 - 17.0 g/dL  Type and screen [place]  Result Value Ref Range   ABO/RH(D) O POS    Antibody Screen NEG    Sample Expiration 09/29/2017   ABO/Rh  Result Value Ref Range   ABO/RH(D) O POS     Dg Cervical Spine 1 View  Result Date: 09/26/2017 CLINICAL DATA:  Status post ACDF of the cervical spine EXAM: CERVICAL SPINE 1 VIEW COMPARISON:  MR 01/29/2017 FINDINGS: Portable intraoperative radiographs obtained via C-arm radiography shows anterior sideplate and screw device at C3-4 with interbody spacer in the C3-4 disc space. A second anterior sideplate and screw device extends from C5 through C7 with interbody spacers at C5-6 and C6-7. The  alignment of the cervical spine appears anatomic. IMPRESSION: Status post ACDF of C3-4 and C5 through C7. Electronically Signed   By: Kerby Moors M.D.   On: 09/26/2017 10:48   Dg C-arm 1-60 Min  Result Date: 09/26/2017 CLINICAL DATA:  Status post ACDF of the cervical spine EXAM: CERVICAL SPINE 1 VIEW COMPARISON:  MR 01/29/2017 FINDINGS: Portable intraoperative radiographs obtained via C-arm radiography shows anterior sideplate and screw device at C3-4 with interbody spacer in the C3-4 disc space. A second anterior sideplate and screw device extends from C5 through C7 with interbody spacers at C5-6 and C6-7. The alignment of the cervical spine appears anatomic. IMPRESSION: Status post ACDF of C3-4 and C5 through C7. Electronically Signed   By: Kerby Moors M.D.   On: 09/26/2017 10:48    Antibiotics:  Anti-infectives (From admission, onward)   Start     Dose/Rate Route Frequency Ordered Stop   09/26/17 1600  ceFAZolin (ANCEF) IVPB 1 g/50 mL premix     1 g 100 mL/hr over 30 Minutes Intravenous Every 8 hours 09/26/17 1304 09/27/17 0844   09/26/17 0833  bacitracin 50,000 Units in sodium chloride irrigation 0.9 % 500 mL irrigation  Status:  Discontinued       As needed 09/26/17 0833 09/26/17 1054   09/26/17 0618  ceFAZolin (ANCEF) IVPB 2g/100 mL premix     2 g 200 mL/hr over 30 Minutes Intravenous On call to O.R. 09/26/17 0618 09/26/17 0843      Discharge Exam: Blood pressure 115/80, pulse  82, temperature 98.1 F (36.7 C), resp. rate 18, height 6\' 2"  (1.88 m), weight 99.8 kg (220 lb), SpO2 97 %. Neurologic: Grossly normal Ambulating and voiding well  Discharge Medications:   Allergies as of 09/27/2017      Reactions   Niaspan [niacin Er] Rash   Tape Rash, Other (See Comments)   Paper tape OK      Medication List    TAKE these medications   Cetirizine HCl 10 MG Caps Commonly known as:  ZYRTEC ALLERGY Take 1 capsule (10 mg total) by mouth daily as needed.   cyclobenzaprine 10 MG  tablet Commonly known as:  FLEXERIL Take 1 tablet (10 mg total) by mouth 3 (three) times daily as needed for muscle spasms.   EPINEPHrine 0.3 mg/0.3 mL Soaj injection Commonly known as:  EPI-PEN Inject 0.3 mLs (0.3 mg total) into the muscle once.   fluticasone 50 MCG/ACT nasal spray Commonly known as:  FLONASE Place 1 spray into both nostrils 3 (three) times daily as needed for allergies.   HYDROcodone-acetaminophen 5-325 MG tablet Commonly known as:  NORCO/VICODIN Take 1 tablet by mouth every 6 (six) hours as needed for moderate pain. What changed:  Another medication with the same name was added. Make sure you understand how and when to take each.   HYDROcodone-acetaminophen 5-325 MG tablet Commonly known as:  NORCO/VICODIN Take 1 tablet by mouth every 4 (four) hours as needed for moderate pain ((score 4 to 6)). What changed:  You were already taking a medication with the same name, and this prescription was added. Make sure you understand how and when to take each.   ondansetron 4 MG disintegrating tablet Commonly known as:  ZOFRAN ODT Take 1 tablet (4 mg total) by mouth every 8 (eight) hours as needed for nausea or vomiting.   PROBIOTIC DAILY PO Take 1 capsule by mouth daily.   ranitidine 300 MG tablet Commonly known as:  ZANTAC Take 300 mg by mouth daily as needed for heartburn.   SUMAtriptan 100 MG tablet Commonly known as:  IMITREX Take 100 mg by mouth every 2 (two) hours as needed for migraine. May repeat in 2 hours if headache persists or recurs.   tamsulosin 0.4 MG Caps capsule Commonly known as:  FLOMAX Take 1 capsule (0.4 mg total) by mouth daily.   VITAMIN B-12 IJ Inject as directed every 30 (thirty) days. Gets at Dr office.  Patient dose not know the dosage       Disposition: home   Final Dx: same as admitting  Discharge Instructions     Remove dressing in 72 hours   Complete by:  As directed    Call MD for:  difficulty breathing, headache or visual  disturbances   Complete by:  As directed    Call MD for:  extreme fatigue   Complete by:  As directed    Call MD for:  hives   Complete by:  As directed    Call MD for:  persistant dizziness or light-headedness   Complete by:  As directed    Call MD for:  persistant nausea and vomiting   Complete by:  As directed    Call MD for:  redness, tenderness, or signs of infection (pain, swelling, redness, odor or green/yellow discharge around incision site)   Complete by:  As directed    Call MD for:  severe uncontrolled pain   Complete by:  As directed    Call MD for:  temperature >100.4  Complete by:  As directed    Diet - low sodium heart healthy   Complete by:  As directed    Driving Restrictions   Complete by:  As directed    No driving 2 weeks   Increase activity slowly   Complete by:  As directed    Lifting restrictions   Complete by:  As directed    Nothing heavier than 8 lbs         Signed: Ocie Cornfield Jacy Howat 09/27/2017, 9:28 AM

## 2017-09-29 ENCOUNTER — Encounter (HOSPITAL_COMMUNITY): Payer: Self-pay | Admitting: Neurosurgery

## 2017-10-01 MED ORDER — PEG 3350-KCL-NA BICARB-NACL 420 G PO SOLR: 4000.0000 mL | Freq: Once | ORAL | 0 refills | Status: AC

## 2017-10-01 MED FILL — Gelatin Absorbable MT Powder: OROMUCOSAL | Qty: 1 | Status: AC

## 2017-10-01 MED FILL — Thrombin (Recombinant) For Soln 20000 Unit: CUTANEOUS | Qty: 1 | Status: AC

## 2017-10-01 MED FILL — Thrombin (Recombinant) For Soln 5000 Unit: CUTANEOUS | Qty: 5000 | Status: AC

## 2017-10-01 MED FILL — Gelatin Absorbable Sponge Size 100: CUTANEOUS | Qty: 1 | Status: AC

## 2017-10-16 ENCOUNTER — Telehealth (INDEPENDENT_AMBULATORY_CARE_PROVIDER_SITE_OTHER): Payer: Self-pay | Admitting: *Deleted

## 2017-10-16 NOTE — Telephone Encounter (Signed)
Referring MD/PCP: fusco   Procedure: tcs  Reason/Indication:  Hx colon ca, fam hx colon ca  Has patient had this procedure before?  Yes, 2015  If so, when, by whom and where?    Is there a family history of colon cancer?  Yes, father  Who?  What age when diagnosed?    Is patient diabetic?   no      Does patient have prosthetic heart valve or mechanical valve?  no  Do you have a pacemaker?  no  Has patient ever had endocarditis? no  Has patient had joint replacement within last 12 months?  no  Is patient constipated or take laxatives? no  Does patient have a history of alcohol/drug use?  no  Is patient on Coumadin, Plavix and/or Aspirin? no  Medications: hydrocodone 5/325 mg tid, rantidine 300 mg daily, imitrex 100 mg 2 tab prn, vit b12 inj once a month, probiotic daily, nasal spray  Allergies: niaspan  Medication Adjustment per Dr Laural Golden:   Procedure date & time: 11/20/17 at 955

## 2017-10-16 NOTE — Telephone Encounter (Signed)
agree

## 2017-11-13 DIAGNOSIS — Z6828 Body mass index (BMI) 28.0-28.9, adult: Secondary | ICD-10-CM | POA: Diagnosis not present

## 2017-11-13 DIAGNOSIS — R030 Elevated blood-pressure reading, without diagnosis of hypertension: Secondary | ICD-10-CM | POA: Diagnosis not present

## 2017-11-13 DIAGNOSIS — M5412 Radiculopathy, cervical region: Secondary | ICD-10-CM | POA: Diagnosis not present

## 2017-11-20 ENCOUNTER — Ambulatory Visit (HOSPITAL_COMMUNITY)
Admission: RE | Admit: 2017-11-20 | Discharge: 2017-11-20 | Disposition: A | Discharge status: Home / Self Care | Payer: 59 | Source: Ambulatory Visit | Attending: Internal Medicine | Admitting: Internal Medicine

## 2017-11-20 ENCOUNTER — Encounter (HOSPITAL_COMMUNITY): Payer: Self-pay | Admitting: *Deleted

## 2017-11-20 ENCOUNTER — Encounter (HOSPITAL_COMMUNITY)
Admission: RE | Disposition: A | Discharge status: Home / Self Care | Payer: Self-pay | Source: Ambulatory Visit | Attending: Internal Medicine

## 2017-11-20 ENCOUNTER — Other Ambulatory Visit: Payer: Self-pay

## 2017-11-20 DIAGNOSIS — C183 Malignant neoplasm of hepatic flexure: Secondary | ICD-10-CM

## 2017-11-20 DIAGNOSIS — K648 Other hemorrhoids: Secondary | ICD-10-CM | POA: Diagnosis not present

## 2017-11-20 DIAGNOSIS — Z981 Arthrodesis status: Secondary | ICD-10-CM | POA: Insufficient documentation

## 2017-11-20 DIAGNOSIS — Z08 Encounter for follow-up examination after completed treatment for malignant neoplasm: Secondary | ICD-10-CM | POA: Diagnosis not present

## 2017-11-20 DIAGNOSIS — Z1211 Encounter for screening for malignant neoplasm of colon: Secondary | ICD-10-CM | POA: Insufficient documentation

## 2017-11-20 DIAGNOSIS — Z85828 Personal history of other malignant neoplasm of skin: Secondary | ICD-10-CM | POA: Diagnosis not present

## 2017-11-20 DIAGNOSIS — Z85038 Personal history of other malignant neoplasm of large intestine: Secondary | ICD-10-CM | POA: Diagnosis not present

## 2017-11-20 DIAGNOSIS — Z8371 Family history of colonic polyps: Secondary | ICD-10-CM | POA: Diagnosis not present

## 2017-11-20 DIAGNOSIS — Z79899 Other long term (current) drug therapy: Secondary | ICD-10-CM | POA: Diagnosis not present

## 2017-11-20 DIAGNOSIS — K219 Gastro-esophageal reflux disease without esophagitis: Secondary | ICD-10-CM | POA: Insufficient documentation

## 2017-11-20 DIAGNOSIS — Z888 Allergy status to other drugs, medicaments and biological substances status: Secondary | ICD-10-CM | POA: Diagnosis not present

## 2017-11-20 DIAGNOSIS — Z980 Intestinal bypass and anastomosis status: Secondary | ICD-10-CM | POA: Diagnosis not present

## 2017-11-20 HISTORY — PX: COLONOSCOPY: SHX5424

## 2017-11-20 SURGERY — COLONOSCOPY
Anesthesia: Moderate Sedation

## 2017-11-20 MED ORDER — SODIUM CHLORIDE 0.9 % IV SOLN
INTRAVENOUS | Status: DC
  Administered 2017-11-20: 09:00:00 via INTRAVENOUS

## 2017-11-20 MED ORDER — MEPERIDINE HCL 50 MG/ML IJ SOLN
INTRAMUSCULAR | Status: AC
  Filled 2017-11-20: qty 1

## 2017-11-20 MED ORDER — MIDAZOLAM HCL 5 MG/5ML IJ SOLN
INTRAMUSCULAR | Status: AC
  Filled 2017-11-20: qty 10

## 2017-11-20 MED ORDER — STERILE WATER FOR IRRIGATION IR SOLN
Status: DC | PRN
  Administered 2017-11-20: 2.5 mL

## 2017-11-20 MED ORDER — MEPERIDINE HCL 50 MG/ML IJ SOLN
INTRAMUSCULAR | Status: DC | PRN
  Administered 2017-11-20 (×2): 25 mg via INTRAVENOUS

## 2017-11-20 MED ORDER — MIDAZOLAM HCL 5 MG/5ML IJ SOLN
INTRAMUSCULAR | Status: DC | PRN
  Administered 2017-11-20 (×5): 2 mg via INTRAVENOUS

## 2017-11-20 NOTE — H&P (Signed)
Eduardo [name] is an 72 y.o. male.   Chief Complaint: Patient is here for colonoscopy. HPI: 72 year old Caucasian male was history of colon carcinoma.  He had right hemicolectomy in December 2014 for a ascending colon carcinoma arising out of sessile serrated polyp.  His last exam Was 1 year later in December 2015 and no polyps are identified. He has no complaints.  Bowels move regularly.  He denies abdominal pain rectal bleeding. History significant for CRC and father and paternal grandfather and they were both in their early 51s and died within a couple of years of metastatic disease.  Past Medical History:  Diagnosis Date  . Colon cancer ([facility]) 10/20/13   colon resection-   . DDD (degenerative disc disease), cervical   . GERD (gastroesophageal reflux disease)   . History of kidney stones    seen on imaging but never knowingly passed spontaneously. Never had any manipulation surgery/procedure for it.   Marland Kitchen History of seasonal allergies   . Kidney stone   . Kidney stones   . Migraine    "maybe 1-2/month" (06/20/2015)  . Neuropathy 2010  . Skin cancer of face    "froze them off"  . Wears glasses     Past Surgical History:  Procedure Laterality Date  . ANTERIOR CERVICAL DECOMP/DISCECTOMY FUSION N/A 09/26/2017   Procedure: ACDF - C3-C4 - C5-C6 - C6-C7;  Surgeon: Earnie Larsson, MD;  Location: [facility];  Service: Neurosurgery;  Laterality: N/A;  . APPENDECTOMY  10/20/13   (with right hemicolectomy)  . CARPAL TUNNEL RELEASE Left 2011  . CARPAL TUNNEL RELEASE Right 01/07/2013   Procedure: CARPAL TUNNEL RELEASE;  Surgeon: Tennis Must, MD;  Location: [facility];  Service: Orthopedics;  Laterality: Right;  . COLON RESECTION N/A 10/20/2013   Procedure:  LAPAROSCOPIC hand assisted partial colectomy;  Surgeon: Jamesetta So, MD;  Location: [facility];  Service: General;  Laterality: N/A;  . COLON SURGERY  2014   x2- 2nd. to correct a perforation , treated with TPN & drain & then later had  to remove more colon   . COLONOSCOPY    . COLONOSCOPY N/A 10/18/2013   Procedure: COLONOSCOPY;  Surgeon: Danie Binder, MD;  [facility];  Service: Endoscopy;  Laterality: N/A;  2:00-moved to 1030 Pam to notify pt  . COLONOSCOPY N/A 09/22/2014   Procedure: COLONOSCOPY;  Surgeon: Rogene Houston, MD;  [facility];  Service: Endoscopy;  Laterality: N/A;  1200  . ESOPHAGOGASTRODUODENOSCOPY N/A 06/19/2016   Procedure: ESOPHAGOGASTRODUODENOSCOPY (EGD);  Surgeon: Rogene Houston, MD;  [facility];  Service: Endoscopy;  Laterality: N/A;  3:00  . EXCISIONAL HEMORRHOIDECTOMY    . INCISIONAL HERNIA REPAIR  06/20/2015   WITH MYOFASCIAL ADVANCEMENT FLAP AND MESH   . INCISIONAL HERNIA REPAIR N/A 06/20/2015   Procedure: INCISIONAL HERNIA REPAIR WITH MYOFASCIAL ADVANCEMENT FLAP AND MESH;  Surgeon: Erroll Luna, MD;  Location: [facility];  Service: General;  Laterality: N/A;  . INGUINAL HERNIA REPAIR Left    "@ [facility]"  . INGUINAL HERNIA REPAIR Bilateral    "[facility]"  . INSERTION OF MESH N/A 06/20/2015   Procedure: INSERTION OF MESH;  Surgeon: Erroll Luna, MD;  Location: [facility];  Service: General;  Laterality: N/A;  . KNEE ARTHROSCOPY Bilateral 2001  . NASAL SEPTUM SURGERY  1980's  . TAKE DOWN OF INTESTINAL FISTULA N/A 02/02/2014   Procedure: TAKE DOWN OF ENTEROCUTANEOUS  FISTULA;  Surgeon: Marcello Moores A. Cornett, MD;  Location: [facility];  Service: General;  Laterality: N/A;  . TONSILLECTOMY    . UPPER GI ENDOSCOPY      Family History  Problem Relation Age of Onset  . Colon polyps Father   . Colon polyps Paternal Uncle   . Colon polyps Maternal Grandfather    Social History:  reports that  has never smoked. he has never used smokeless tobacco. He reports that he does not drink alcohol or use drugs.  Allergies:  Allergies  Allergen Reactions  . Niaspan [Niacin Er] Rash  . Tape Rash and Other (See Comments)    Paper tape OK    Medications Prior to Admission   Medication Sig Dispense Refill  . fluticasone (FLONASE) 50 MCG/ACT nasal spray Place 2 sprays into both nostrils 2 (two) times daily as needed for allergies.     Marland Kitchen HYDROcodone-acetaminophen (NORCO/VICODIN) 5-325 MG tablet Take 1 tablet by mouth every 4 (four) hours as needed for moderate pain ((score 4 to 6)). 30 tablet 0  . Probiotic Product (PROBIOTIC DAILY PO) Take 1 capsule by mouth daily.     . ranitidine (ZANTAC) 300 MG tablet Take 300 mg by mouth daily as needed for heartburn.    . Cetirizine HCl (ZYRTEC ALLERGY) 10 MG CAPS Take 1 capsule (10 mg total) by mouth daily as needed. (Patient not taking: Reported on 09/18/2017) 10 capsule 0  . cyanocobalamin (,VITAMIN B-12,) 1000 MCG/ML injection Inject 1,000 mcg into the muscle every 30 (thirty) days.    . cyclobenzaprine (FLEXERIL) 10 MG tablet Take 1 tablet (10 mg total) by mouth 3 (three) times daily as needed for muscle spasms. 30 tablet 0  . EPINEPHrine 0.3 mg/0.3 mL IJ SOAJ injection Inject 0.3 mLs (0.3 mg total) into the muscle once. (Patient taking differently: Inject 0.3 mg into the muscle once. ) 1 Device 0  . ondansetron (ZOFRAN ODT) 4 MG disintegrating tablet Take 1 tablet (4 mg total) by mouth every 8 (eight) hours as needed for nausea or vomiting. (Patient not taking: Reported on 09/18/2017) 15 tablet 0  . SUMAtriptan (IMITREX) 100 MG tablet Take 100 mg by mouth every 2 (two) hours as needed for migraine. May repeat in 2 hours if headache persists or recurs.    . tamsulosin (FLOMAX) 0.4 MG CAPS capsule Take 1 capsule (0.4 mg total) by mouth daily. (Patient not taking: Reported on 09/18/2017) 30 capsule 2    No results found for this or any previous visit (from the past 48 hour(s)). No results found.  ROS  Blood pressure 139/88, pulse 79, temperature 97.9 F (36.6 C), temperature source Oral, resp. rate 16, height 6\' 2"  (1.88 m), weight 210 lb (95.3 kg), SpO2 96 %. Physical Exam  Constitutional: He appears well-developed and  well-nourished.  HENT:  Mouth/Throat: Oropharynx is clear and moist.  Eyes: Conjunctivae are normal. No scleral icterus.  Neck: No thyromegaly present.  Scar at neck to the right of midline.  Cardiovascular: Normal rate, regular rhythm and normal heart sounds.  No murmur heard. Respiratory: Effort normal and breath sounds normal.  GI:  Midline scar.  Abdomen is soft and nontender without organomegaly or masses.  Musculoskeletal: He exhibits no edema.  Lymphadenopathy:    He has no cervical adenopathy.  Neurological: He is alert.  Skin: Skin is warm and dry.     Assessment/Plan History of colon carcinoma. Surveillance colonoscopy.  Hildred Laser, MD 11/20/2017, 9:40 AM

## 2017-11-20 NOTE — Discharge Instructions (Signed)
Colonoscopy, Adult A colonoscopy is an exam to look at the large intestine. It is done to check for problems, such as:  Lumps (tumors).  Growths (polyps).  Swelling (inflammation).  Bleeding.  What happens before the procedure? Eating and drinking Follow instructions from your doctor about eating and drinking. These instructions may include:  A few days before the procedure - follow a low-fiber diet. ? Avoid nuts. ? Avoid seeds. ? Avoid dried fruit. ? Avoid raw fruits. ? Avoid vegetables.  1-3 days before the procedure - follow a clear liquid diet. Avoid liquids that have red or purple dye. Drink only clear liquids, such as: ? Clear broth or bouillon. ? Black coffee or tea. ? Clear juice. ? Clear soft drinks or sports drinks. ? Gelatin dessert. ? Popsicles.  On the day of the procedure - do not eat or drink anything during the 2 hours before the procedure.  Bowel prep If you were prescribed an oral bowel prep:  Take it as told by your doctor. Starting the day before your procedure, you will need to drink a lot of liquid. The liquid will cause you to poop (have bowel movements) until your poop is almost clear or light green.  If your skin or butt gets irritated from diarrhea, you may: ? Wipe the area with wipes that have medicine in them, such as adult wet wipes with aloe and vitamin E. ? Put something on your skin that soothes the area, such as petroleum jelly.  If you throw up (vomit) while drinking the bowel prep, take a break for up to 60 minutes. Then begin the bowel prep again. If you keep throwing up and you cannot take the bowel prep without throwing up, call your doctor.  General instructions  Ask your doctor about changing or stopping your normal medicines. This is important if you take diabetes medicines or blood thinners.  Plan to have someone take you home from the hospital or clinic. What happens during the procedure?  An IV tube may be put into one  of your veins.  You will be given medicine to help you relax (sedative).  To reduce your risk of infection: ? Your doctors will wash their hands. ? Your anal area will be washed with soap.  You will be asked to lie on your side with your knees bent.  Your doctor will get a long, thin, flexible tube ready. The tube will have a camera and a light on the end.  The tube will be put into your anus.  The tube will be gently put into your large intestine.  Air will be delivered into your large intestine to keep it open. You may feel some pressure or cramping.  The camera will be used to take photos.  A small tissue sample may be removed from your body to be looked at under a microscope (biopsy). If any possible problems are found, the tissue will be sent to a lab for testing.  If small growths are found, your doctor may remove them and have them checked for cancer.  The tube that was put into your anus will be slowly removed. The procedure may vary among doctors and hospitals. What happens after the procedure?  Your doctor will check on you often until the medicines you were given have worn off.  Do not drive for 24 hours after the procedure.  You may have a small amount of blood in your poop.  You may pass gas.  You  may have mild cramps or bloating in your belly (abdomen).  It is up to you to get the results of your procedure. Ask your doctor, or the department performing the procedure, when your results will be ready. This information is not intended to replace advice given to you by your health care provider. Make sure you discuss any questions you have with your health care provider. Document Released: 11/09/2010 Document Revised: 08/07/2016 Document Reviewed: 12/19/2015 Elsevier Interactive Patient Education  2017 [employer]  Hemorrhoids Hemorrhoids are swollen veins in and around the rectum or anus. There are two types of hemorrhoids:  Internal hemorrhoids. These  occur in the veins that are just inside the rectum. They may poke through to the outside and become irritated and painful.  External hemorrhoids. These occur in the veins that are outside of the anus and can be felt as a painful swelling or hard lump near the anus.  Most hemorrhoids do not cause serious problems, and they can be managed with home treatments such as diet and lifestyle changes. If home treatments do not help your symptoms, procedures can be done to shrink or remove the hemorrhoids. What are the causes? This condition is caused by increased pressure in the anal area. This pressure may result from various things, including:  Constipation.  Straining to have a bowel movement.  Diarrhea.  Pregnancy.  Obesity.  Sitting for long periods of time.  Heavy lifting or other activity that causes you to strain.  Anal sex.  What are the signs or symptoms? Symptoms of this condition include:  Pain.  Anal itching or irritation.  Rectal bleeding.  Leakage of stool (feces).  Anal swelling.  One or more lumps around the anus.  How is this diagnosed? This condition can often be diagnosed through a visual exam. Other exams or tests may also be done, such as:  Examination of the rectal area with a gloved hand (digital rectal exam).  Examination of the anal canal using a small tube (anoscope).  A blood test, if you have lost a significant amount of blood.  A test to look inside the colon (sigmoidoscopy or colonoscopy).  How is this treated? This condition can usually be treated at home. However, various procedures may be done if dietary changes, lifestyle changes, and other home treatments do not help your symptoms. These procedures can help make the hemorrhoids smaller or remove them completely. Some of these procedures involve surgery, and others do not. Common procedures include:  Rubber band ligation. Rubber bands are placed at the base of the hemorrhoids to cut off  the blood supply to them.  Sclerotherapy. Medicine is injected into the hemorrhoids to shrink them.  Infrared coagulation. A type of light energy is used to get rid of the hemorrhoids.  Hemorrhoidectomy surgery. The hemorrhoids are surgically removed, and the veins that supply them are tied off.  Stapled hemorrhoidopexy surgery. A circular stapling device is used to remove the hemorrhoids and use staples to cut off the blood supply to them.  Follow these instructions at home: Eating and drinking  Eat foods that have a lot of fiber in them, such as whole grains, beans, nuts, fruits, and vegetables. Ask your health care provider about taking products that have added fiber (fiber supplements).  Drink enough fluid to keep your urine clear or pale yellow. Managing pain and swelling  Take warm sitz baths for 20 minutes, 3-4 times a day to ease pain and discomfort.  If directed, apply ice to  the affected area. Using ice packs between sitz baths may be helpful. ? Put ice in a plastic bag. ? Place a towel between your skin and the bag. ? Leave the ice on for 20 minutes, 2-3 times a day. General instructions  Take over-the-counter and prescription medicines only as told by your health care provider.  Use medicated creams or suppositories as told.  Exercise regularly.  Go to the bathroom when you have the urge to have a bowel movement. Do not wait.  Avoid straining to have bowel movements.  Keep the anal area dry and clean. Use wet toilet paper or moist towelettes after a bowel movement.  Do not sit on the toilet for long periods of time. This increases blood pooling and pain. Contact a health care provider if:  You have increasing pain and swelling that are not controlled by treatment or medicine.  You have uncontrolled bleeding.  You have difficulty having a bowel movement, or you are unable to have a bowel movement.  You have pain or inflammation outside the area of the  hemorrhoids. This information is not intended to replace advice given to you by your health care provider. Make sure you discuss any questions you have with your health care provider. Document Released: 10/04/2000 Document Revised: 03/06/2016 Document Reviewed: 06/21/2015 Elsevier Interactive Patient Education  2018 [place] usual medications and diet. No driving for 24 hours. Next colonoscopy in 5 years.

## 2017-11-20 NOTE — Op Note (Signed)
[facility] Patient Name: Eduardo [name] Procedure Date: 11/20/2017 9:07 AM MRN: [id number] Date of Birth: Jan 17, 1946 Attending MD: Hildred Laser , MD CSN: [id number] Age: 72 Admit Type: Outpatient Procedure:                Colonoscopy Indications:              High risk colon cancer surveillance: Personal                            history of colon cancer Providers:                Hildred Laser, MD, Lurline Del, RN, Nelma Rothman,                            Technician Referring MD:             [employer], MD Medicines:                Meperidine 50 mg IV, Midazolam 10 mg IV Complications:            No immediate complications. Estimated Blood Loss:     Estimated blood loss: none. Procedure:                Pre-Anesthesia Assessment:                           - Prior to the procedure, a History and Physical                            was performed, and patient medications and                            allergies were reviewed. The patient's tolerance of                            previous anesthesia was also reviewed. The risks                            and benefits of the procedure and the sedation                            options and risks were discussed with the patient.                            All questions were answered, and informed consent                            was obtained. Prior Anticoagulants: The patient has                            taken no previous anticoagulant or antiplatelet                            agents. ASA Grade Assessment: II - A patient with  mild systemic disease. After reviewing the risks                            and benefits, the patient was deemed in                            satisfactory condition to undergo the procedure.                           After obtaining informed consent, the colonoscope                            was passed under direct vision. Throughout the                            procedure, the  patient's blood pressure, pulse, and                            oxygen saturations were monitored continuously. The                            EC-[id number] ([id number]) scope was introduced through                            the anus and advanced to the the ileocolonic                            anastomosis. The colonoscopy was performed without                            difficulty. The patient tolerated the procedure                            well. The quality of the bowel preparation was                            good. The terminal ileum ileo-colonic anastamosis                            and the rectum were photographed. Scope In: 10:00:26 AM Scope Out: 10:18:54 AM Scope Withdrawal Time: 0 hours 12 minutes 48 seconds  Total Procedure Duration: 0 hours 18 minutes 28 seconds  Findings:      The perianal and digital rectal examinations were normal.      The neo-terminal ileum appeared normal.      There was evidence of a prior end-to-side ileo-colonic anastomosis at       the hepatic flexure. This was patent and was characterized by healthy       appearing mucosa. The anastomosis was traversed.      The rectum, recto-sigmoid colon, sigmoid colon, descending colon,       splenic flexure, transverse colon and hepatic flexure appeared normal.      Internal hemorrhoids were found during retroflexion. The hemorrhoids       were small. Impression:               -  The examined portion of the ileum was normal.                           - Patent end-to-side ileo-colonic anastomosis,                            characterized by healthy appearing mucosa.                           - The rectum, recto-sigmoid colon, sigmoid colon,                            descending colon, splenic flexure, transverse colon                            and hepatic flexure are normal.                           - Internal hemorrhoids.                           - No specimens collected. Moderate Sedation:      Moderate  (conscious) sedation was administered by the endoscopy nurse       and supervised by the endoscopist. The following parameters were       monitored: oxygen saturation, heart rate, blood pressure, CO2       capnography and response to care. Total physician intraservice time was       26 minutes. Recommendation:           - Patient has a contact number available for                            emergencies. The signs and symptoms of potential                            delayed complications were discussed with the                            patient. Return to normal activities tomorrow.                            Written discharge instructions were provided to the                            patient.                           - Resume previous diet today.                           - Continue present medications.                           - Repeat colonoscopy in 5 years for surveillance. Procedure Code(s):        --- Professional ---  [phone], Colonoscopy, flexible; diagnostic, including                            collection of specimen(s) by brushing or washing,                            when performed (separate procedure)                           99152, Moderate sedation services provided by the                            same physician or other qualified health care                            professional performing the diagnostic or                            therapeutic service that the sedation supports,                            requiring the presence of an independent trained                            observer to assist in the monitoring of the                            patient's level of consciousness and physiological                            status; initial 15 minutes of intraservice time,                            patient age 4 years or older                           [phone], Moderate sedation services; each additional                            15  minutes intraservice time Diagnosis Code(s):        --- Professional ---                           [phone], Personal history of other malignant                            neoplasm of large intestine                           K64.8, Other hemorrhoids                           Z98.0, Intestinal bypass and anastomosis status CPT copyright 2016 [employer]. All rights reserved. The codes documented in this report are preliminary and upon coder review may  be revised to meet current compliance  requirements. Hildred Laser, MD Hildred Laser, MD 11/20/2017 10:27:29 AM This report has been signed electronically. Number of Addenda: 0

## 2017-11-24 ENCOUNTER — Encounter (HOSPITAL_COMMUNITY): Payer: Self-pay | Admitting: Internal Medicine

## 2017-11-24 DIAGNOSIS — E538 Deficiency of other specified B group vitamins: Secondary | ICD-10-CM | POA: Diagnosis not present

## 2017-12-18 DIAGNOSIS — D510 Vitamin B12 deficiency anemia due to intrinsic factor deficiency: Secondary | ICD-10-CM | POA: Diagnosis not present

## 2017-12-18 DIAGNOSIS — K219 Gastro-esophageal reflux disease without esophagitis: Secondary | ICD-10-CM | POA: Diagnosis not present

## 2017-12-18 DIAGNOSIS — E538 Deficiency of other specified B group vitamins: Secondary | ICD-10-CM | POA: Diagnosis not present

## 2017-12-18 DIAGNOSIS — Z1389 Encounter for screening for other disorder: Secondary | ICD-10-CM | POA: Diagnosis not present

## 2017-12-18 DIAGNOSIS — C183 Malignant neoplasm of hepatic flexure: Secondary | ICD-10-CM | POA: Diagnosis not present

## 2017-12-18 DIAGNOSIS — G894 Chronic pain syndrome: Secondary | ICD-10-CM | POA: Diagnosis not present

## 2017-12-18 DIAGNOSIS — Z6829 Body mass index (BMI) 29.0-29.9, adult: Secondary | ICD-10-CM | POA: Diagnosis not present

## 2017-12-18 DIAGNOSIS — J309 Allergic rhinitis, unspecified: Secondary | ICD-10-CM | POA: Diagnosis not present

## 2018-01-21 DIAGNOSIS — D510 Vitamin B12 deficiency anemia due to intrinsic factor deficiency: Secondary | ICD-10-CM | POA: Diagnosis not present

## 2018-04-20 DIAGNOSIS — D510 Vitamin B12 deficiency anemia due to intrinsic factor deficiency: Secondary | ICD-10-CM | POA: Diagnosis not present

## 2018-05-27 DIAGNOSIS — D510 Vitamin B12 deficiency anemia due to intrinsic factor deficiency: Secondary | ICD-10-CM | POA: Diagnosis not present

## 2018-06-11 DIAGNOSIS — Z6829 Body mass index (BMI) 29.0-29.9, adult: Secondary | ICD-10-CM | POA: Diagnosis not present

## 2018-06-11 DIAGNOSIS — K219 Gastro-esophageal reflux disease without esophagitis: Secondary | ICD-10-CM | POA: Diagnosis not present

## 2018-06-11 DIAGNOSIS — M48061 Spinal stenosis, lumbar region without neurogenic claudication: Secondary | ICD-10-CM | POA: Diagnosis not present

## 2018-06-11 DIAGNOSIS — Z1389 Encounter for screening for other disorder: Secondary | ICD-10-CM | POA: Diagnosis not present

## 2018-06-11 DIAGNOSIS — G894 Chronic pain syndrome: Secondary | ICD-10-CM | POA: Diagnosis not present

## 2018-06-11 DIAGNOSIS — Z0000 Encounter for general adult medical examination without abnormal findings: Secondary | ICD-10-CM | POA: Diagnosis not present

## 2018-06-23 DIAGNOSIS — K642 Third degree hemorrhoids: Secondary | ICD-10-CM | POA: Diagnosis not present

## 2018-06-26 DIAGNOSIS — D510 Vitamin B12 deficiency anemia due to intrinsic factor deficiency: Secondary | ICD-10-CM | POA: Diagnosis not present

## 2018-07-21 ENCOUNTER — Ambulatory Visit: Payer: Self-pay | Admitting: Surgery

## 2018-07-21 DIAGNOSIS — K642 Third degree hemorrhoids: Secondary | ICD-10-CM | POA: Diagnosis not present

## 2018-07-21 NOTE — H&P (Signed)
Eduardo [name] Documented: 07/21/2018 4:12 PM Location: [facility] Patient #: [id number] DOB: 07/31/1946 Married / Language: English / Race: White Male  History of Present Illness Eduardo Moores A. Jace Fermin MD; 07/21/2018 4:35 PM) Patient words: Patient returns for follow-up of his internal hemorrhoid disease. He's had minimal improvement with medical management. He still was bleeding and prolapse and discomfort and burning and itching.  The patient is a 72 year old male.   Allergies Sabino Gasser; 07/21/2018 4:13 PM) Niaspan *ANTIHYPERLIPIDEMICS* Tape 1"X5yd *MEDICAL DEVICES*  Medication History Sabino Gasser; 07/21/2018 4:13 PM) Analpram-HC (1-1% Cream, 1 (one) Rectal three times daily, as needed, Taken starting 06/23/2018) Active. (Apply to affected area QID PRN) Famotidine (20MG  Tablet, Oral) Active. PredniSONE (20MG  Tablet, Oral) Active. Tamsulosin HCl (0.4MG  Capsule, Oral) Active. Ranitidine HCl (300MG  Tablet, Oral) Active. Hydrocodone-Acetaminophen (5-325MG  Tablet, Oral) Active. Vicodin (5-300MG  Tablet, Oral) Active. Flonase (50MCG/ACT Suspension, Nasal) Active. Anusol-HC (25MG  Suppository, Rectal) Active. Reglan (10MG  Tablet, Oral) Active. Naproxen Sodium (220MG  Capsule, Oral) Active. Roxicet (5-325MG  Tablet, Oral) Active. Maxalt (10MG  Tablet, Oral) Active. Ventolin HFA (108 (90 Base)MCG/ACT Aerosol Soln, Inhalation) Active. Medications Reconciled    Vitals Sabino Gasser; 07/21/2018 4:14 PM) 07/21/2018 4:13 PM Weight: 214.25 lb Height: 74in Body Surface Area: 2.24 m Body Mass Index: 27.51 kg/m  Temp.: 98.37F(Oral)  Pulse: 78 (Regular)  BP: 130/82 (Sitting, Left Arm, Standard)      Physical Exam (Oddie Kuhlmann A. Aury Scollard MD; 07/21/2018 4:36 PM)  General Mental Status-Alert. General Appearance-Consistent with stated age. Hydration-Well hydrated. Voice-Normal.  Chest and Lung Exam Chest and lung exam reveals -quiet, even  and easy respiratory effort with no use of accessory muscles and on auscultation, normal breath sounds, no adventitious sounds and normal vocal resonance. Inspection Chest Wall - Normal. Back - normal.  Cardiovascular Cardiovascular examination reveals -normal heart sounds, regular rate and rhythm with no murmurs and normal pedal pulses bilaterally.  Rectal Note: No change in examination. Stable internal hemorrhoid disease  Neurologic Neurologic evaluation reveals -alert and oriented x 3 with no impairment of recent or remote memory. Mental Status-Normal.    Assessment & Plan (Layten Aiken A. Shawnna Pancake MD; 07/21/2018 4:35 PM)  PROLAPSED INTERNAL HEMORRHOIDS, GRADE 3 (K64.2) Impression: Treatment options discussed for the treatment of hemorrhoid disease. Risk, benefit and alternatives discussed. Pt has failed medical management. Risk of hemorrhoid surgery include bleeding, infection, anal stenosis, pain, incontinence, bleeding, pain, recurrence. Pt agrees to proceed.  Current Plans Pt Education - CCS Hemorrhoids (Gross): discussed with patient and provided information. You are being scheduled for surgery- Our schedulers will call you.  You should hear from our office's scheduling department within 5 working days about the location, date, and time of surgery. We try to make accommodations for patient's preferences in scheduling surgery, but sometimes the OR schedule or the surgeon's schedule prevents [place] from making those accommodations.  If you have not heard from our office [phone]) in 5 working days, call the office and ask for your surgeon's nurse.  If you have other questions about your diagnosis, plan, or surgery, call the office and ask for your surgeon's nurse.  Pt Education - CCS Rectal Surgery HCI (Gross): discussed with patient and provided information. The anatomy & physiology of the anorectal region was discussed. The pathophysiology of hemorrhoids and differential  diagnosis was discussed. Natural history risks without surgery was discussed. I stressed the importance of a bowel regimen to have daily soft bowel movements to minimize progression of disease. Interventions such as sclerotherapy & banding were discussed.  The  patient's symptoms are not adequately controlled by medicines and other non-operative treatments. I feel the risks & problems of no surgery outweigh the operative risks; therefore, I recommended surgery to treat the hemorrhoids by ligation, pexy, and possible resection.  Risks such as bleeding, infection, urinary difficulties, need for further treatment, heart attack, death, and other risks were discussed. I noted a good likelihood this will help address the problem. Goals of post-operative recovery were discussed as well. Possibility that this will not correct all symptoms was explained. Post-operative pain, bleeding, constipation, and other problems after surgery were discussed. We will work to minimize complications. Educational handouts further explaining the pathology, treatment options, and bowel regimen were given as well. Questions were answered. The patient expresses understanding & wishes to proceed with surgery.

## 2018-07-21 NOTE — H&P (View-Only) (Signed)
Saul Fabiano Tweed Documented: 07/21/2018 4:12 PM Location: [facility] Patient #: [id number] DOB: Aug 21, 1946 Married / Language: English / Race: White Male  History of Present Illness Marcello Moores A. Teosha Casso MD; 07/21/2018 4:35 PM) Patient words: Patient returns for follow-up of his internal hemorrhoid disease. He's had minimal improvement with medical management. He still was bleeding and prolapse and discomfort and burning and itching.  The patient is a 72 year old male.   Allergies Sabino Gasser; 07/21/2018 4:13 PM) Niaspan *ANTIHYPERLIPIDEMICS* Tape 1"X5yd *MEDICAL DEVICES*  Medication History Sabino Gasser; 07/21/2018 4:13 PM) Analpram-HC (1-1% Cream, 1 (one) Rectal three times daily, as needed, Taken starting 06/23/2018) Active. (Apply to affected area QID PRN) Famotidine (20MG  Tablet, Oral) Active. PredniSONE (20MG  Tablet, Oral) Active. Tamsulosin HCl (0.4MG  Capsule, Oral) Active. Ranitidine HCl (300MG  Tablet, Oral) Active. Hydrocodone-Acetaminophen (5-325MG  Tablet, Oral) Active. Vicodin (5-300MG  Tablet, Oral) Active. Flonase (50MCG/ACT Suspension, Nasal) Active. Anusol-HC (25MG  Suppository, Rectal) Active. Reglan (10MG  Tablet, Oral) Active. Naproxen Sodium (220MG  Capsule, Oral) Active. Roxicet (5-325MG  Tablet, Oral) Active. Maxalt (10MG  Tablet, Oral) Active. Ventolin HFA (108 (90 Base)MCG/ACT Aerosol Soln, Inhalation) Active. Medications Reconciled    Vitals Sabino Gasser; 07/21/2018 4:14 PM) 07/21/2018 4:13 PM Weight: 214.25 lb Height: 74in Body Surface Area: 2.24 m Body Mass Index: 27.51 kg/m  Temp.: 98.77F(Oral)  Pulse: 78 (Regular)  BP: 130/82 (Sitting, Left Arm, Standard)      Physical Exam (Sylvia Helms A. Beuford Garcilazo MD; 07/21/2018 4:36 PM)  General Mental Status-Alert. General Appearance-Consistent with stated age. Hydration-Well hydrated. Voice-Normal.  Chest and Lung Exam Chest and lung exam reveals -quiet, even  and easy respiratory effort with no use of accessory muscles and on auscultation, normal breath sounds, no adventitious sounds and normal vocal resonance. Inspection Chest Wall - Normal. Back - normal.  Cardiovascular Cardiovascular examination reveals -normal heart sounds, regular rate and rhythm with no murmurs and normal pedal pulses bilaterally.  Rectal Note: No change in examination. Stable internal hemorrhoid disease  Neurologic Neurologic evaluation reveals -alert and oriented x 3 with no impairment of recent or remote memory. Mental Status-Normal.    Assessment & Plan (Magdalene Tardiff A. Anesha Hackert MD; 07/21/2018 4:35 PM)  PROLAPSED INTERNAL HEMORRHOIDS, GRADE 3 (K64.2) Impression: Treatment options discussed for the treatment of hemorrhoid disease. Risk, benefit and alternatives discussed. Pt has failed medical management. Risk of hemorrhoid surgery include bleeding, infection, anal stenosis, pain, incontinence, bleeding, pain, recurrence. Pt agrees to proceed.  Current Plans Pt Education - CCS Hemorrhoids (Gross): discussed with patient and provided information. You are being scheduled for surgery- Our schedulers will call you.  You should hear from our office's scheduling department within 5 working days about the location, date, and time of surgery. We try to make accommodations for patient's preferences in scheduling surgery, but sometimes the OR schedule or the surgeon's schedule prevents [place] from making those accommodations.  If you have not heard from our office [phone]) in 5 working days, call the office and ask for your surgeon's nurse.  If you have other questions about your diagnosis, plan, or surgery, call the office and ask for your surgeon's nurse.  Pt Education - CCS Rectal Surgery HCI (Gross): discussed with patient and provided information. The anatomy & physiology of the anorectal region was discussed. The pathophysiology of hemorrhoids and differential  diagnosis was discussed. Natural history risks without surgery was discussed. I stressed the importance of a bowel regimen to have daily soft bowel movements to minimize progression of disease. Interventions such as sclerotherapy & banding were discussed.  The  patient's symptoms are not adequately controlled by medicines and other non-operative treatments. I feel the risks & problems of no surgery outweigh the operative risks; therefore, I recommended surgery to treat the hemorrhoids by ligation, pexy, and possible resection.  Risks such as bleeding, infection, urinary difficulties, need for further treatment, heart attack, death, and other risks were discussed. I noted a good likelihood this will help address the problem. Goals of post-operative recovery were discussed as well. Possibility that this will not correct all symptoms was explained. Post-operative pain, bleeding, constipation, and other problems after surgery were discussed. We will work to minimize complications. Educational handouts further explaining the pathology, treatment options, and bowel regimen were given as well. Questions were answered. The patient expresses understanding & wishes to proceed with surgery.

## 2018-07-23 DIAGNOSIS — D510 Vitamin B12 deficiency anemia due to intrinsic factor deficiency: Secondary | ICD-10-CM | POA: Diagnosis not present

## 2018-07-31 ENCOUNTER — Other Ambulatory Visit: Payer: Self-pay

## 2018-07-31 ENCOUNTER — Encounter (HOSPITAL_BASED_OUTPATIENT_CLINIC_OR_DEPARTMENT_OTHER): Payer: Self-pay | Admitting: *Deleted

## 2018-08-06 ENCOUNTER — Ambulatory Visit (HOSPITAL_BASED_OUTPATIENT_CLINIC_OR_DEPARTMENT_OTHER): Payer: 59 | Admitting: Anesthesiology

## 2018-08-06 ENCOUNTER — Encounter (HOSPITAL_BASED_OUTPATIENT_CLINIC_OR_DEPARTMENT_OTHER): Payer: Self-pay

## 2018-08-06 ENCOUNTER — Ambulatory Visit (HOSPITAL_BASED_OUTPATIENT_CLINIC_OR_DEPARTMENT_OTHER)
Admission: RE | Admit: 2018-08-06 | Discharge: 2018-08-06 | Disposition: A | Discharge status: Home / Self Care | Payer: 59 | Source: Ambulatory Visit | Attending: Surgery | Admitting: Surgery

## 2018-08-06 ENCOUNTER — Encounter (HOSPITAL_BASED_OUTPATIENT_CLINIC_OR_DEPARTMENT_OTHER)
Admission: RE | Disposition: A | Discharge status: Home / Self Care | Payer: Self-pay | Source: Ambulatory Visit | Attending: Surgery

## 2018-08-06 ENCOUNTER — Other Ambulatory Visit: Payer: Self-pay

## 2018-08-06 DIAGNOSIS — Z7952 Long term (current) use of systemic steroids: Secondary | ICD-10-CM | POA: Diagnosis not present

## 2018-08-06 DIAGNOSIS — M1990 Unspecified osteoarthritis, unspecified site: Secondary | ICD-10-CM | POA: Diagnosis not present

## 2018-08-06 DIAGNOSIS — Z7951 Long term (current) use of inhaled steroids: Secondary | ICD-10-CM | POA: Diagnosis not present

## 2018-08-06 DIAGNOSIS — Z791 Long term (current) use of non-steroidal anti-inflammatories (NSAID): Secondary | ICD-10-CM | POA: Insufficient documentation

## 2018-08-06 DIAGNOSIS — K649 Unspecified hemorrhoids: Secondary | ICD-10-CM | POA: Diagnosis not present

## 2018-08-06 DIAGNOSIS — K642 Third degree hemorrhoids: Secondary | ICD-10-CM | POA: Diagnosis not present

## 2018-08-06 DIAGNOSIS — Z888 Allergy status to other drugs, medicaments and biological substances status: Secondary | ICD-10-CM | POA: Insufficient documentation

## 2018-08-06 DIAGNOSIS — Z79899 Other long term (current) drug therapy: Secondary | ICD-10-CM | POA: Insufficient documentation

## 2018-08-06 HISTORY — PX: EVALUATION UNDER ANESTHESIA WITH HEMORRHOIDECTOMY: SHX5624

## 2018-08-06 SURGERY — EXAM UNDER ANESTHESIA WITH HEMORRHOIDECTOMY
Anesthesia: General

## 2018-08-06 MED ORDER — ACETAMINOPHEN 500 MG PO TABS
1000.0000 mg | ORAL_TABLET | ORAL | Status: AC
  Administered 2018-08-06: 1000 mg via ORAL

## 2018-08-06 MED ORDER — CELECOXIB 200 MG PO CAPS
ORAL_CAPSULE | ORAL | Status: AC
  Filled 2018-08-06: qty 1

## 2018-08-06 MED ORDER — FENTANYL CITRATE (PF) 100 MCG/2ML IJ SOLN
50.0000 ug | INTRAMUSCULAR | Status: DC | PRN
  Administered 2018-08-06: 100 ug via INTRAVENOUS

## 2018-08-06 MED ORDER — ONDANSETRON HCL 4 MG/2ML IJ SOLN
INTRAMUSCULAR | Status: DC | PRN
  Administered 2018-08-06: 4 mg via INTRAVENOUS

## 2018-08-06 MED ORDER — MEPERIDINE HCL 25 MG/ML IJ SOLN: 6.2500 mg | INTRAMUSCULAR | Status: DC | PRN

## 2018-08-06 MED ORDER — LIDOCAINE 5 % EX OINT: 1.0000 "application " | TOPICAL_OINTMENT | CUTANEOUS | 0 refills | Status: DC | PRN

## 2018-08-06 MED ORDER — EPHEDRINE SULFATE-NACL 50-0.9 MG/10ML-% IV SOSY
PREFILLED_SYRINGE | INTRAVENOUS | Status: DC | PRN
  Administered 2018-08-06: 10 mg via INTRAVENOUS

## 2018-08-06 MED ORDER — SODIUM CHLORIDE 0.9 % IV SOLN
2.0000 g | INTRAVENOUS | Status: AC
  Administered 2018-08-06: 2 g via INTRAVENOUS

## 2018-08-06 MED ORDER — MIDAZOLAM HCL 2 MG/2ML IJ SOLN
INTRAMUSCULAR | Status: AC
  Filled 2018-08-06: qty 2

## 2018-08-06 MED ORDER — GABAPENTIN 300 MG PO CAPS
ORAL_CAPSULE | ORAL | Status: AC
  Filled 2018-08-06: qty 1

## 2018-08-06 MED ORDER — DEXAMETHASONE SODIUM PHOSPHATE 4 MG/ML IJ SOLN
INTRAMUSCULAR | Status: DC | PRN
  Administered 2018-08-06: 10 mg via INTRAVENOUS

## 2018-08-06 MED ORDER — CELECOXIB 200 MG PO CAPS
200.0000 mg | ORAL_CAPSULE | ORAL | Status: AC
  Administered 2018-08-06: 200 mg via ORAL

## 2018-08-06 MED ORDER — FENTANYL CITRATE (PF) 100 MCG/2ML IJ SOLN
25.0000 ug | INTRAMUSCULAR | Status: DC | PRN
  Administered 2018-08-06: 25 ug via INTRAVENOUS
  Administered 2018-08-06: 50 ug via INTRAVENOUS

## 2018-08-06 MED ORDER — BUPIVACAINE LIPOSOME 1.3 % IJ SUSP: 20.0000 mL | Freq: Once | INTRAMUSCULAR | Status: DC

## 2018-08-06 MED ORDER — FENTANYL CITRATE (PF) 100 MCG/2ML IJ SOLN
INTRAMUSCULAR | Status: AC
  Filled 2018-08-06: qty 2

## 2018-08-06 MED ORDER — LIDOCAINE 2% (20 MG/ML) 5 ML SYRINGE
INTRAMUSCULAR | Status: AC
  Filled 2018-08-06: qty 5

## 2018-08-06 MED ORDER — OXYCODONE HCL 5 MG PO TABS: 5.0000 mg | ORAL_TABLET | Freq: Four times a day (QID) | ORAL | 0 refills | Status: DC | PRN

## 2018-08-06 MED ORDER — CHLORHEXIDINE GLUCONATE CLOTH 2 % EX PADS: 6.0000 | MEDICATED_PAD | Freq: Once | CUTANEOUS | Status: DC

## 2018-08-06 MED ORDER — ACETAMINOPHEN 500 MG PO TABS
ORAL_TABLET | ORAL | Status: AC
  Filled 2018-08-06: qty 2

## 2018-08-06 MED ORDER — EPHEDRINE 5 MG/ML INJ
INTRAVENOUS | Status: AC
  Filled 2018-08-06: qty 10

## 2018-08-06 MED ORDER — MIDAZOLAM HCL 2 MG/2ML IJ SOLN
1.0000 mg | INTRAMUSCULAR | Status: DC | PRN
  Administered 2018-08-06: 2 mg via INTRAVENOUS

## 2018-08-06 MED ORDER — LACTATED RINGERS IV SOLN
INTRAVENOUS | Status: DC
  Administered 2018-08-06: 08:00:00 via INTRAVENOUS

## 2018-08-06 MED ORDER — LIDOCAINE HCL URETHRAL/MUCOSAL 2 % EX GEL
CUTANEOUS | Status: DC | PRN
  Administered 2018-08-06: 1

## 2018-08-06 MED ORDER — LIDOCAINE 2% (20 MG/ML) 5 ML SYRINGE
INTRAMUSCULAR | Status: DC | PRN
  Administered 2018-08-06: 50 mg via INTRAVENOUS

## 2018-08-06 MED ORDER — PROPOFOL 10 MG/ML IV BOLUS
INTRAVENOUS | Status: DC | PRN
  Administered 2018-08-06: 160 mg via INTRAVENOUS

## 2018-08-06 MED ORDER — IBUPROFEN 800 MG PO TABS: 800.0000 mg | ORAL_TABLET | Freq: Three times a day (TID) | ORAL | 0 refills | Status: DC | PRN

## 2018-08-06 MED ORDER — SCOPOLAMINE 1 MG/3DAYS TD PT72: 1.0000 | MEDICATED_PATCH | Freq: Once | TRANSDERMAL | Status: DC | PRN

## 2018-08-06 MED ORDER — GABAPENTIN 300 MG PO CAPS
300.0000 mg | ORAL_CAPSULE | ORAL | Status: AC
  Administered 2018-08-06: 300 mg via ORAL

## 2018-08-06 MED ORDER — ONDANSETRON HCL 4 MG/2ML IJ SOLN
INTRAMUSCULAR | Status: AC
  Filled 2018-08-06: qty 2

## 2018-08-06 MED ORDER — SODIUM CHLORIDE 0.9 % IV SOLN
INTRAVENOUS | Status: DC | PRN
  Administered 2018-08-06: 40 mL

## 2018-08-06 MED ORDER — POLYETHYLENE GLYCOL 3350 17 GM/SCOOP PO POWD: 17.0000 g | Freq: Every day | ORAL | 3 refills | Status: AC

## 2018-08-06 MED ORDER — DEXAMETHASONE SODIUM PHOSPHATE 10 MG/ML IJ SOLN
INTRAMUSCULAR | Status: AC
  Filled 2018-08-06: qty 1

## 2018-08-06 MED ORDER — CEFOTETAN DISODIUM-DEXTROSE 2-2.08 GM-%(50ML) IV SOLR
INTRAVENOUS | Status: AC
  Filled 2018-08-06: qty 50

## 2018-08-06 SURGICAL SUPPLY — 46 items
APL SKNCLS STERI-STRIP NONHPOA (GAUZE/BANDAGES/DRESSINGS) ×1
BENZOIN TINCTURE PRP APPL 2/3 (GAUZE/BANDAGES/DRESSINGS) ×3 IMPLANT
BLADE SURG 11 STRL SS (BLADE) IMPLANT
BLADE SURG 15 STRL LF DISP TIS (BLADE) ×1 IMPLANT
BLADE SURG 15 STRL SS (BLADE) ×3
BRIEF STRETCH FOR OB PAD XXL (UNDERPADS AND DIAPERS) IMPLANT
CANISTER SUCT 1200ML W/VALVE (MISCELLANEOUS) ×3 IMPLANT
COVER BACK TABLE 60X90IN (DRAPES) IMPLANT
COVER MAYO STAND STRL (DRAPES) ×2 IMPLANT
COVER WAND RF STERILE (DRAPES) IMPLANT
DECANTER SPIKE VIAL GLASS SM (MISCELLANEOUS) IMPLANT
DRAPE LAPAROTOMY 100X72 PEDS (DRAPES) IMPLANT
DRAPE UTILITY XL STRL (DRAPES) ×3 IMPLANT
DRSG PAD ABDOMINAL 8X10 ST (GAUZE/BANDAGES/DRESSINGS) ×3 IMPLANT
ELECT COATED BLADE 2.86 ST (ELECTRODE) ×3 IMPLANT
ELECT REM PT RETURN 9FT ADLT (ELECTROSURGICAL) ×3
ELECTRODE REM PT RTRN 9FT ADLT (ELECTROSURGICAL) ×1 IMPLANT
GAUZE SPONGE 4X4 12PLY STRL LF (GAUZE/BANDAGES/DRESSINGS) ×2 IMPLANT
GLOVE BIOGEL PI IND STRL 8 (GLOVE) ×1 IMPLANT
GLOVE BIOGEL PI INDICATOR 8 (GLOVE) ×2
GLOVE ECLIPSE 8.0 STRL XLNG CF (GLOVE) ×3 IMPLANT
GOWN STRL REUS W/ TWL LRG LVL3 (GOWN DISPOSABLE) ×2 IMPLANT
GOWN STRL REUS W/TWL LRG LVL3 (GOWN DISPOSABLE) ×6
HEMOSTAT SURGICEL 2X14 (HEMOSTASIS) IMPLANT
NDL HYPO 25X1 1.5 SAFETY (NEEDLE) ×1 IMPLANT
NEEDLE HYPO 25X1 1.5 SAFETY (NEEDLE) ×3 IMPLANT
PACK BASIN DAY SURGERY FS (CUSTOM PROCEDURE TRAY) ×3 IMPLANT
PACK LITHOTOMY IV (CUSTOM PROCEDURE TRAY) ×2 IMPLANT
PENCIL BUTTON HOLSTER BLD 10FT (ELECTRODE) ×3 IMPLANT
SHEARS HARMONIC 9CM CVD (BLADE) ×2 IMPLANT
SLEEVE SCD COMPRESS KNEE MED (MISCELLANEOUS) ×3 IMPLANT
SPONGE HEMORRHOID 8X3CM (HEMOSTASIS) ×2 IMPLANT
SPONGE SURGIFOAM ABS GEL 100 (HEMOSTASIS) IMPLANT
SURGILUBE 2OZ TUBE FLIPTOP (MISCELLANEOUS) ×3 IMPLANT
SUT MON AB 3-0 SH 27 (SUTURE) ×6
SUT MON AB 3-0 SH27 (SUTURE) ×2 IMPLANT
SYR CONTROL 10ML LL (SYRINGE) ×3 IMPLANT
TAPE CLOTH 3X10 TAN LF (GAUZE/BANDAGES/DRESSINGS) ×3 IMPLANT
TAPE CLOTH SURG 6X10 WHT LF (GAUZE/BANDAGES/DRESSINGS) IMPLANT
TOWEL GREEN STERILE FF (TOWEL DISPOSABLE) ×3 IMPLANT
TOWEL OR NON WOVEN STRL DISP B (DISPOSABLE) ×3 IMPLANT
TRAY DSU PREP LF (CUSTOM PROCEDURE TRAY) ×3 IMPLANT
TUBE CONNECTING 20'X1/4 (TUBING) ×1
TUBE CONNECTING 20X1/4 (TUBING) ×2 IMPLANT
UNDERPAD 30X30 (UNDERPADS AND DIAPERS) ×3 IMPLANT
YANKAUER SUCT BULB TIP NO VENT (SUCTIONS) ×3 IMPLANT

## 2018-08-06 NOTE — Anesthesia Procedure Notes (Signed)
Procedure Name: LMA Insertion Performed by: Terrance Mass, CRNA Pre-anesthesia Checklist: Patient identified, Emergency Drugs available, Suction available and Patient being monitored Patient Re-evaluated:Patient Re-evaluated prior to induction Oxygen Delivery Method: Circle system utilized Preoxygenation: Pre-oxygenation with 100% oxygen Induction Type: IV induction Ventilation: Mask ventilation without difficulty LMA: LMA inserted LMA Size: 4.5 Number of attempts: 1 Placement Confirmation: positive ETCO2 Tube secured with: Tape Dental Injury: Teeth and Oropharynx as per pre-operative assessment

## 2018-08-06 NOTE — Transfer of Care (Signed)
Immediate Anesthesia Transfer of Care Note  Patient: Eduardo [name]  Procedure(s) Performed: EXAM UNDER ANESTHESIA WITH POSSIBLE HEMORRHOIDECTOMY ERAS PATHWAY (N/A )  Patient Location: PACU  Anesthesia Type:General  Level of Consciousness: awake and sedated  Airway & Oxygen Therapy: Patient Spontanous Breathing and Patient connected to face mask oxygen  Post-op Assessment: Report given to RN and Post -op Vital signs reviewed and stable  Post vital signs: Reviewed and stable  Last Vitals:  Vitals Value Taken Time  BP 115/75 08/06/2018  9:34 AM  Temp    Pulse 74 08/06/2018  9:36 AM  Resp 13 08/06/2018  9:36 AM  SpO2 100 % 08/06/2018  9:36 AM  Vitals shown include unvalidated device data.  Last Pain:  Vitals:   08/06/18 0739  TempSrc: Oral  PainSc: 0-No pain         Complications: No apparent anesthesia complications

## 2018-08-06 NOTE — Anesthesia Preprocedure Evaluation (Signed)
Anesthesia Evaluation  Patient identified by MRN, date of birth, ID band Patient awake    Reviewed: Allergy & Precautions, NPO status , Patient's Chart, lab work & pertinent test results  Airway Mallampati: II  TM Distance: <3 FB Neck ROM: Full    Dental  (+) Teeth Intact, Dental Advisory Given   Pulmonary neg pulmonary ROS,    Pulmonary exam normal breath sounds clear to auscultation       Cardiovascular Exercise Tolerance: Good negative cardio ROS Normal cardiovascular exam Rhythm:Regular Rate:Normal     Neuro/Psych  Headaches, US CAROTID 2/18 IMPRESSION: 1. Early left carotid bulb plaque resulting in less than 50% diameter stenosis. 2.  Antegrade bilateral vertebral arterial flow.  negative psych ROS   GI/Hepatic Neg liver ROS, GERD  Medicated,Colon cancer   Endo/Other  negative endocrine ROS  Renal/GU negative Renal ROS     Musculoskeletal  (+) Arthritis , Osteoarthritis,    Abdominal   Peds  Hematology negative hematology ROS (+)   Anesthesia Other Findings Day of surgery medications reviewed with the patient.  Reproductive/Obstetrics                             Anesthesia Physical  Anesthesia Plan  ASA: II  Anesthesia Plan: General   Post-op Pain Management:    Induction: Intravenous  PONV Risk Score and Plan: 2 and Dexamethasone and Ondansetron  Airway Management Planned: LMA and Oral ETT  Additional Equipment:   Intra-op Plan:   Post-operative Plan: Extubation in OR  Informed Consent: I have reviewed the patients History and Physical, chart, labs and discussed the procedure including the risks, benefits and alternatives for the proposed anesthesia with the patient or authorized representative who has indicated his/her understanding and acceptance.   Dental advisory given  Plan Discussed with: CRNA  Anesthesia Plan Comments: (Risks/benefits of general  anesthesia discussed with patient including risk of damage to teeth, lips, gum, and tongue, nausea/vomiting, allergic reactions to medications, and the possibility of heart attack, stroke and death.  All patient questions answered.  Patient wishes to proceed.)        Anesthesia Quick Evaluation

## 2018-08-06 NOTE — Anesthesia Postprocedure Evaluation (Signed)
Anesthesia Post Note  Patient: Eduardo [name]  Procedure(s) Performed: EXAM UNDER ANESTHESIA WITH POSSIBLE HEMORRHOIDECTOMY ERAS PATHWAY (N/A )     Patient location during evaluation: PACU Anesthesia Type: General Level of consciousness: awake and alert Pain management: pain level controlled Vital Signs Assessment: post-procedure vital signs reviewed and stable Respiratory status: spontaneous breathing, nonlabored ventilation, respiratory function stable and patient connected to nasal cannula oxygen Cardiovascular status: blood pressure returned to baseline and stable Postop Assessment: no apparent nausea or vomiting Anesthetic complications: no    Last Vitals:  Vitals:   08/06/18 1030 08/06/18 1111  BP: (!) 144/94 130/88  Pulse: 72 69  Resp: 16 16  Temp:  36.5 C  SpO2: 100% 100%    Last Pain:  Vitals:   08/06/18 1111  TempSrc:   PainSc: 0-No pain                 Detria Cummings

## 2018-08-06 NOTE — Discharge Instructions (Signed)
NO TYLENOL BEFORE 2 PM TODAY!     Information for Discharge Teaching: EXPAREL (bupivacaine liposome injectable suspension)   Your surgeon or anesthesiologist gave you EXPAREL(bupivacaine) to help control your pain after surgery.   EXPAREL is a local anesthetic that provides pain relief by numbing the tissue around the surgical site.  EXPAREL is designed to release pain medication over time and can control pain for up to 72 hours.  Depending on how you respond to EXPAREL, you may require less pain medication during your recovery.  Possible side effects:  Temporary loss of sensation or ability to move in the area where bupivacaine was injected.  Nausea, vomiting, constipation  Rarely, numbness and tingling in your mouth or lips, lightheadedness, or anxiety may occur.  Call your doctor right away if you think you may be experiencing any of these sensations, or if you have other questions regarding possible side effects.  Follow all other discharge instructions given to you by your surgeon or nurse. Eat a healthy diet and drink plenty of water or other fluids.  If you return to the hospital for any reason within 96 hours following the administration of EXPAREL, it is important for health care providers to know that you have received this anesthetic. A teal colored band has been placed on your arm with the date, time and amount of EXPAREL you have received in order to alert and inform your health care providers. Please leave this armband in place for the full 96 hours following administration, and then you may remove the band.[facility] _______[facility]  RECTAL SURGERY POST OP INSTRUCTIONS: POST OP INSTRUCTIONS  Always review your discharge instruction sheet given to you by the facility where your surgery was performed. IF YOU HAVE DISABILITY OR FAMILY LEAVE FORMS, YOU MUST BRING THEM TO THE OFFICE FOR PROCESSING.   DO NOT GIVE THEM TO YOUR DOCTOR.  1. A  prescription for  pain medication may be given to you upon discharge.  Take your pain medication as prescribed, if needed.  If narcotic pain medicine is not needed, then you may take acetaminophen (Tylenol) or ibuprofen (Advil) as needed. 2. Take your usually prescribed medications unless otherwise directed. 3. If you need a refill on your pain medication, please contact your pharmacy.  They will contact our office to request authorization. Prescriptions will not be filled after 5 pm or on week-ends. 4. You should follow a light diet the first 48 hours after arrival home, such as soup and crackers, etc.  Be sure to include lots of fluids daily.  Resume your normal diet 2-3 days after surgery.. 5. Most patients will experience some swelling and discomfort in the rectal area. Ice packs, reclining and warm tub soaks will help.  Swelling and discomfort can take several days to resolve.  6. It is common to experience some constipation if taking pain medication after surgery.  Increasing fluid intake and taking a stool softener (such as Colace) will usually help or prevent this problem from occurring.  A mild laxative (Milk of Magnesia or Miralax) should be taken according to package directions if there are no bowel movements after 48 hours. 7. Unless discharge instructions indicate otherwise, leave your bandage dry and in place for 24 hours, or remove the bandage if you have a bowel movement. You may notice a small amount of bleeding with bowel movements for the first few days. You may have some packing in the rectum which will come out over the first day or  two. You will need to wear an absorbent pad or soft cotton gauze in your underwear until the drainage stops.it. 8. ACTIVITIES:  You may resume regular (light) daily activities beginning the next day--such as daily self-care, walking, climbing stairs--gradually increasing activities as tolerated.  You may have sexual intercourse when it is comfortable.  Refrain from any heavy  lifting or straining until approved by your doctor. a. You may drive when you are no longer taking prescription pain medication, you can comfortably wear a seatbelt, and you can safely maneuver your car and apply brakes. b. RETURN TO WORK: : ____________________ c.  9. You should see your doctor in the office for a follow-up appointment approximately 2-3 weeks after your surgery.  Make sure that you call for this appointment within a day or two after you arrive home to insure a convenient appointment time. 10. OTHER INSTRUCTIONS:  __________________________________________________________________________________________________________________________________________________________________________________________  WHEN TO CALL YOUR DOCTOR: 1. Fever over 101.0 2. Inability to urinate 3. Nausea and/or vomiting 4. Extreme swelling or bruising 5. Continued bleeding from rectum. 6. Increased pain, redness, or drainage from the incision 7. Constipation  The clinic staff is available to answer your questions during regular business hours.  Please dont hesitate to call and ask to speak to one of the nurses for clinical concerns.  If you have a medical emergency, go to the nearest emergency room or call 911.  A surgeon from [facility] is always on call at the hospital   [address] [place], [place], [place]  [address] ?  [address], [place], [place]   [address] [phone] ? [phone] ? FAX [phone] Web site: [email or web address]     Post Anesthesia Home Care Instructions  Activity: Get plenty of rest for the remainder of the day. A responsible individual must stay with you for 24 hours following the procedure.  For the next 24 hours, DO NOT: -Drive a car -Paediatric nurse -Drink alcoholic beverages -Take any medication unless instructed by your physician -Make any legal decisions or sign important papers.  Meals: Start with liquid foods  such as gelatin or soup. Progress to regular foods as tolerated. Avoid greasy, spicy, heavy foods. If nausea and/or vomiting occur, drink only clear liquids until the nausea and/or vomiting subsides. Call your physician if vomiting continues.  Special Instructions/Symptoms: Your throat may feel dry or sore from the anesthesia or the breathing tube placed in your throat during surgery. If this causes discomfort, gargle with warm salt water. The discomfort should disappear within 24 hours.  If you had a scopolamine patch placed behind your ear for the management of post- operative nausea and/or vomiting:  1. The medication in the patch is effective for 72 hours, after which it should be removed.  Wrap patch in a tissue and discard in the trash. Wash hands thoroughly with soap and water. 2. You may remove the patch earlier than 72 hours if you experience unpleasant side effects which may include dry mouth, dizziness or visual disturbances. 3. Avoid touching the patch. Wash your hands with soap and water after contact with the patch.

## 2018-08-06 NOTE — Interval H&P Note (Signed)
History and Physical Interval Note:  08/06/2018 8:28 AM  Eduardo [name]  has presented today for surgery, with the diagnosis of hemorrhoids  The various methods of treatment have been discussed with the patient and family. After consideration of risks, benefits and other options for treatment, the patient has consented to  Procedure(s): [place] (N/A) as a surgical intervention .  The patient's history has been reviewed, patient examined, no change in status, stable for surgery.  I have reviewed the patient's chart and labs.  Questions were answered to the patient's satisfaction.     [place]

## 2018-08-06 NOTE — Op Note (Signed)
Preoperative diagnosis: Grade 3 prolapsed internal hemorrhoids 2 columns  Postoperative diagnosis: Same  Procedure: 2 column internal and external hemorrhoidectomy  Surgeon: Erroll Luna M.D.  Anesthesia: LMA with local anesthesia/ Exparel 20 cc/20 cc saline   EBL: Less than 50 cc  Specimens: Hemorrhoid tissue  Drains: None  IV fluids: per record  Indications for procedure: The patient presents with symptomatic hemorrhoid disease. Options of treatment including medical management, office-based procedures and surgical modalities. All have been discussed with the patient. The patient is failed medical management. They wish to proceed with formal hemorrhoid ectomy. Risks, benefits and all alternative therapies with complications of long term expectations discussed. They agree to proceed with surgery.  Description of procedure: The patient was met in the holding area and questions are answered. The patient was taken back to the operating room and placed upon the operating room table. After induction of LMA anesthesia, the patient was placed in lithotomy and appropriate the padded. The anal canal and perineum were prepped and draped in a sterile fashion. Timeout was done and the patient received preoperative antibiotics. Harmonic scalpel was used after digital examination to excise hemorrhoid tissue in the left lateral and right posterior position.. Internal sphincter was preserved.  Hemostasis achieved. Irrigation used. No significant narrowing of the anal canal noted. Gelfoam pack placed with 2 % lidocaine jelly . Exparel  infiltrated for perianal block. All final counts are found to be correct. The patient was taken to lithotomy, extubated and transported to recovery in satisfactory condition.

## 2018-08-10 ENCOUNTER — Encounter (HOSPITAL_BASED_OUTPATIENT_CLINIC_OR_DEPARTMENT_OTHER): Payer: Self-pay | Admitting: Surgery

## 2018-08-25 ENCOUNTER — Encounter (HOSPITAL_BASED_OUTPATIENT_CLINIC_OR_DEPARTMENT_OTHER): Payer: Self-pay | Admitting: Surgery

## 2018-08-27 DIAGNOSIS — L821 Other seborrheic keratosis: Secondary | ICD-10-CM | POA: Diagnosis not present

## 2018-08-27 DIAGNOSIS — L818 Other specified disorders of pigmentation: Secondary | ICD-10-CM | POA: Diagnosis not present

## 2018-08-27 DIAGNOSIS — L814 Other melanin hyperpigmentation: Secondary | ICD-10-CM | POA: Diagnosis not present

## 2018-08-27 DIAGNOSIS — L820 Inflamed seborrheic keratosis: Secondary | ICD-10-CM | POA: Diagnosis not present

## 2018-08-27 DIAGNOSIS — D225 Melanocytic nevi of trunk: Secondary | ICD-10-CM | POA: Diagnosis not present

## 2018-09-02 DIAGNOSIS — D510 Vitamin B12 deficiency anemia due to intrinsic factor deficiency: Secondary | ICD-10-CM | POA: Diagnosis not present

## 2018-09-21 DIAGNOSIS — C189 Malignant neoplasm of colon, unspecified: Secondary | ICD-10-CM | POA: Diagnosis not present

## 2018-09-21 DIAGNOSIS — Z6828 Body mass index (BMI) 28.0-28.9, adult: Secondary | ICD-10-CM | POA: Diagnosis not present

## 2018-09-21 DIAGNOSIS — Z1389 Encounter for screening for other disorder: Secondary | ICD-10-CM | POA: Diagnosis not present

## 2018-09-21 DIAGNOSIS — K219 Gastro-esophageal reflux disease without esophagitis: Secondary | ICD-10-CM | POA: Diagnosis not present

## 2018-09-21 DIAGNOSIS — G894 Chronic pain syndrome: Secondary | ICD-10-CM | POA: Diagnosis not present

## 2019-02-17 ENCOUNTER — Other Ambulatory Visit (HOSPITAL_COMMUNITY): Payer: Self-pay | Admitting: Internal Medicine

## 2019-02-17 ENCOUNTER — Other Ambulatory Visit: Payer: Self-pay | Admitting: Internal Medicine

## 2019-02-17 DIAGNOSIS — G894 Chronic pain syndrome: Secondary | ICD-10-CM | POA: Diagnosis not present

## 2019-02-17 DIAGNOSIS — G119 Hereditary ataxia, unspecified: Secondary | ICD-10-CM

## 2019-02-17 DIAGNOSIS — Z6829 Body mass index (BMI) 29.0-29.9, adult: Secondary | ICD-10-CM | POA: Diagnosis not present

## 2019-02-17 DIAGNOSIS — D510 Vitamin B12 deficiency anemia due to intrinsic factor deficiency: Secondary | ICD-10-CM | POA: Diagnosis not present

## 2019-02-17 DIAGNOSIS — Z1389 Encounter for screening for other disorder: Secondary | ICD-10-CM | POA: Diagnosis not present

## 2019-02-17 DIAGNOSIS — R269 Unspecified abnormalities of gait and mobility: Secondary | ICD-10-CM | POA: Diagnosis not present

## 2019-02-17 DIAGNOSIS — R51 Headache: Secondary | ICD-10-CM | POA: Diagnosis not present

## 2019-02-18 ENCOUNTER — Ambulatory Visit (HOSPITAL_COMMUNITY)
Admission: RE | Admit: 2019-02-18 | Discharge: 2019-02-18 | Disposition: A | Discharge status: Home / Self Care | Payer: 59 | Source: Ambulatory Visit | Attending: Internal Medicine | Admitting: Internal Medicine

## 2019-02-18 ENCOUNTER — Other Ambulatory Visit: Payer: Self-pay

## 2019-02-18 DIAGNOSIS — G119 Hereditary ataxia, unspecified: Secondary | ICD-10-CM | POA: Diagnosis not present

## 2019-02-18 DIAGNOSIS — R9082 White matter disease, unspecified: Secondary | ICD-10-CM | POA: Diagnosis not present

## 2019-04-21 ENCOUNTER — Ambulatory Visit (INDEPENDENT_AMBULATORY_CARE_PROVIDER_SITE_OTHER): Payer: 59 | Admitting: Neurology

## 2019-04-21 ENCOUNTER — Other Ambulatory Visit: Payer: Self-pay

## 2019-04-21 ENCOUNTER — Encounter: Payer: Self-pay | Admitting: Neurology

## 2019-04-21 VITALS — BP 138/93 | HR 68 | Temp 97.3°F | Ht 74.0 in | Wt 219.8 lb

## 2019-04-21 DIAGNOSIS — R51 Headache: Secondary | ICD-10-CM | POA: Diagnosis not present

## 2019-04-21 DIAGNOSIS — E538 Deficiency of other specified B group vitamins: Secondary | ICD-10-CM | POA: Diagnosis not present

## 2019-04-21 DIAGNOSIS — G3184 Mild cognitive impairment, so stated: Secondary | ICD-10-CM | POA: Diagnosis not present

## 2019-04-21 DIAGNOSIS — G3281 Cerebellar ataxia in diseases classified elsewhere: Secondary | ICD-10-CM

## 2019-04-21 DIAGNOSIS — R519 Headache, unspecified: Secondary | ICD-10-CM

## 2019-04-21 MED ORDER — TOPIRAMATE 50 MG PO TABS: 50.0000 mg | ORAL_TABLET | Freq: Two times a day (BID) | ORAL | 2 refills | Status: DC

## 2019-04-21 MED ORDER — ASPIRIN EC 81 MG PO TBEC: 81.0000 mg | DELAYED_RELEASE_TABLET | Freq: Every day | ORAL | 2 refills | Status: AC

## 2019-04-21 NOTE — Patient Instructions (Signed)
I had a long discussion with patient and his wife regarding his symptoms of transient gait ataxia which is likely multifactorial due to combination of his underlying B12 deficiency, degenerative cervical spine disease and perhaps vertebral basilar insufficiency.  He also has chronic daily headaches which are mixed migraine headaches with analgesic rebound as well as mild cognitive impairment.  I recommend further evaluation by checking MRI of the brain and neck to look for occlusive posterior circulation disease, EEG and dementia panel labs for reversible causes of cognitive impairment.  Trial of Topamax 50 mg daily for 1 week to be increased to twice daily to help for both his paresthesias from neuropathy as well as chronic daily headache.  I counseled him to cut back using hydrocodone over the next 2 weeks and discontinue to diminish analgesic rebound headache.  I also recommend he start taking aspirin 81 mg daily for stroke prevention.  He will return for follow-up in the future in 2 months or call earlier if necessary.  Analgesic Rebound Headache An analgesic rebound headache, sometimes called a medication overuse headache, is a headache that comes after pain medicine (analgesic) taken to treat the original (primary) headache has worn off. Any type of primary headache can return as a rebound headache if a person regularly takes analgesics more than three times a week to treat it. The types of primary headaches that are commonly associated with rebound headaches include:  Migraines.  Headaches that arise from tense muscles in the head and neck area (tension headaches).  Headaches that develop and happen again (recur) on one side of the head and around the eye (cluster headaches). If rebound headaches continue, they become chronic daily headaches. What are the causes? This condition may be caused by frequent use of:  Over-the-counter medicines such as aspirin, ibuprofen, and acetaminophen.  Sinus  relief medicines and other medicines that contain caffeine.  Narcotic pain medicines such as codeine and oxycodone. What are the signs or symptoms? The symptoms of a rebound headache are the same as the symptoms of the original headache. Some of the symptoms of specific types of headaches include: Migraine headache  Pulsing or throbbing pain on one or both sides of the head.  Severe pain that interferes with daily activities.  Pain that is worsened by physical activity.  Nausea, vomiting, or both.  Pain with exposure to bright light, loud noises, or strong smells.  General sensitivity to bright light, loud noises, or strong smells.  Visual changes.  Numbness of one or both arms. Tension headache  Pressure around the head.  Dull, aching head pain.  Pain felt over the front and sides of the head.  Tenderness in the muscles of the head, neck, and shoulders. Cluster headache  Severe pain that begins in or around one eye or temple.  Redness and tearing in the eye on the same side as the pain.  Droopy or swollen eyelid.  One-sided head pain.  Nausea.  Runny nose.  Sweaty, pale facial skin.  Restlessness. How is this diagnosed? This condition is diagnosed by:  Reviewing your medical history. This includes the nature of your primary headaches.  Reviewing the types of pain medicines that you have been using to treat your headaches and how often you take them. How is this treated? This condition may be treated or managed by:  Discontinuing frequent use of the analgesic medicine. Doing this may worsen your headaches at first, but the pain should eventually become more manageable, less frequent, and less  severe.  Seeing a headache specialist. He or she may be able to help you manage your headaches and help make sure there is not another cause of the headaches.  Using methods of stress relief, such as acupuncture, counseling, biofeedback, and massage. Talk with your  health care provider about which methods might be good for you. Follow these instructions at home:  Take over-the-counter and prescription medicines only as told by your health care provider.  Stop the repeated use of pain medicine as told by your health care provider. Stopping can be difficult. Carefully follow instructions from your health care provider.  Avoid triggers that are known to cause your primary headaches.  Keep all follow-up visits as told by your health care provider. This is important. Contact a health care provider if:  You continue to experience headaches after following treatments that your health care provider recommended. Get help right away if:  You develop new headache pain.  You develop headache pain that is different than what you have experienced in the past.  You develop numbness or tingling in your arms or legs.  You develop changes in your speech or vision. This information is not intended to replace advice given to you by your health care provider. Make sure you discuss any questions you have with your health care provider. Document Released: 12/28/2003 Document Revised: 09/19/2017 Document Reviewed: 03/11/2016 Elsevier Patient Education  2020 [employer].

## 2019-04-21 NOTE — Progress Notes (Signed)
[facility] [address] [place]. [place] [address] [phone]       OFFICE CONSULT NOTE  Mr. Eduardo [name] Date of Birth:  January 19, 1946 Medical Record Number:  [id number]   Referring MD: [employer]  Reason for Referral: Ataxia  HPI: Eduardo [name] is a 73 year old pleasant Caucasian male seen today for initial office consultation visit accompanied by his wife.  History is obtained from them and review of electronic medical records, referral notes as well as I personally reviewed imaging films in PACS.  He states that he has had longstanding paresthesias in his feet and was diagnosed with B12 deficiency several years ago by his primary physician.  He was seen by neurologist in [place] Dr. Merlene Laughter but I do not have any records from his office for my review.  He has been on B12 shots but has had persistent paresthesias and mild gait and balance difficulties.  He has tried Lyrica and gabapentin in the past but did not respond well and had side effects and hence stopped taking them.  Over the last 3 months he is noticed some increasing gait and balance difficulties particularly when he gets up from sitting position.  He often leans to the left.  He often needs to hold onto avoid falls.  He has had no actual falls or injuries.  In fact had an MRI scan of the brain done on 02/17/2019 in [facility] which I personally reviewed showed mild generalized atrophy and changes of small vessel disease and no acute abnormality.  He does also have degenerative cervical spine disease but at the present time denies significant neck pain radicular pain or muscle weakness.  He has also noticed longstanding short-term memory and cognitive difficulties which also appear to be getting worse.  He has trouble remembering recent information from appointments and needs constant reminders.  He often misplaces objects but may remember them later.  He is denies any hallucinations, delusions, agitation slurred  speech focal extremity weakness.  He does states that he had an episode in March when he was driving and reached home and noticed that he has decreased vision in his left eye as well as trouble speaking.  He was also leaning to the left side.  He sat in the car for several minutes before he felt better.  He did not mention this to his wife for several weeks.  Patient also complains of chronic headaches.  He has longstanding history of migraines but over the years the headaches have become constant and daily.  He describes 2 kinds of headaches mild dull headache which is present constantly throughout the day and is present upon awakening 4-5/10 in severity.  He also has intermittent sharp shooting pain in the left temples which lasted barely an hour or so and occurs couple of times a week.  He has been taking 2 to 3 tablets of hydrocodone daily for these.  Occasionally may take Tylenol and ibuprofen with partial relief only.  His current also has a prescription for Maxalt but states that he takes it only once or twice a month.  He denies any history of depression recurrent depressed symptoms.  He was tried on Cymbalta and Lyrica did not tolerate them well but states this was for paresthesias and not for depression.  He denies any significant head injury with loss of consciousness, seizures.  There is no family history of dementia.  He did have recent lab work in April which I have reviewed and LDL cholesterol was  76 mg percent.  He is not on a statin.  ROS:   14 system review of systems is positive for dizziness, imbalance, numbness, headaches, memory difficulties, fall and all other systems negative  PMH:  Past Medical History:  Diagnosis Date   Chronic pain syndrome    Colon cancer ([place]) 10/20/13   colon resection-    DDD (degenerative disc disease), cervical    GERD (gastroesophageal reflux disease)    History of kidney stones    seen on imaging but never knowingly passed spontaneously. Never  had any manipulation surgery/procedure for it.    History of seasonal allergies    Kidney stone    Kidney stones    Migraine    "maybe 1-2/month" (06/20/2015)   Neuropathy 2010   Primary osteoarthritis    Skin cancer of face    "froze them off"   Stroke [facility])    Wears glasses     Social History:  Social History   Socioeconomic History   Marital status: Married    Spouse name: Not on file   Number of children: Not on file   Years of education: Not on file   Highest education level: Not on file  Occupational History   Not on file  Social Needs   Financial resource strain: Not on file   Food insecurity    Worry: Not on file    Inability: Not on file   Transportation needs    Medical: Not on file    Non-medical: Not on file  Tobacco Use   Smoking status: Never Smoker   Smokeless tobacco: Never Used  Substance and Sexual Activity   Alcohol use: No   Drug use: No   Sexual activity: Never    Birth control/protection: None  Lifestyle   Physical activity    Days per week: Not on file    Minutes per session: Not on file   Stress: Not on file  Relationships   Social connections    Talks on phone: Not on file    Gets together: Not on file    Attends religious service: Not on file    Active member of club or organization: Not on file    Attends meetings of clubs or organizations: Not on file    Relationship status: Not on file   Intimate partner violence    Fear of current or ex partner: Not on file    Emotionally abused: Not on file    Physically abused: Not on file    Forced sexual activity: Not on file  Other Topics Concern   Not on file  Social History Narrative   Not on file    Medications:   Current Outpatient Medications on File Prior to Visit  Medication Sig Dispense Refill   cyanocobalamin (,VITAMIN B-12,) 1000 MCG/ML injection Inject 1,000 mcg into the muscle every 30 (thirty) days.     EPINEPHrine 0.3 mg/0.3 mL IJ SOAJ  injection Inject 0.3 mLs (0.3 mg total) into the muscle once. (Patient taking differently: Inject 0.3 mg into the muscle once. ) 1 Device 0   fluticasone (FLONASE) 50 MCG/ACT nasal spray Place 2 sprays into both nostrils 2 (two) times daily as needed for allergies.      HYDROcodone-acetaminophen (NORCO/VICODIN) 5-325 MG tablet Take 1 tablet by mouth every 6 (six) hours as needed for moderate pain.     ibuprofen (ADVIL,MOTRIN) 800 MG tablet Take 1 tablet (800 mg total) by mouth every 8 (eight) hours as needed. [place]  tablet 0   polyethylene glycol powder (GLYCOLAX) powder Take 17 g by mouth daily. 255 g 3   ranitidine (ZANTAC) 300 MG tablet Take 300 mg by mouth daily as needed for heartburn.     No current facility-administered medications on file prior to visit.     Allergies:   Allergies  Allergen Reactions   Niaspan [Niacin Er] Rash   Tape Rash and Other (See Comments)    Paper tape OK    Physical Exam General: well developed, well nourished elderly Caucasian male, seated, in no evident distress Head: head normocephalic and atraumatic.   Neck: supple with no carotid or supraclavicular bruits Cardiovascular: regular rate and rhythm, no murmurs Musculoskeletal: no deformity Skin:  no rash/petichiae Vascular:  Normal pulses all extremities  Neurologic Exam Mental Status: Awake and fully alert. Oriented to place and time. Recent and remote memory intact. Attention span, concentration and fund of knowledge appropriate. Mood and affect appropriate.  Mini-Mental status exam scored 26/30 with deficits in recall and intention.  Clock drawing score 3/4.  Able to name only 8 animals which walk on 4 legs.  Geriatric depression scale 1 only. Cranial Nerves: Fundoscopic exam reveals sharp disc margins. Pupils equal, briskly reactive to light. Extraocular movements full without nystagmus. Visual fields full to confrontation. Hearing intact. Facial sensation intact. Face, tongue, palate moves  normally and symmetrically.  Motor: Normal bulk and tone. Normal strength in all tested extremity muscles except mild weakness of ankle dorsiflexors right greater than left.. Sensory.: intact to touch , pinprick , position and vibratory sensation in upper extremities but diminished touch pinprick and vibration in lower extremities from ankle down bilaterally.  Romberg sign is positive..  Coordination: Rapid alternating movements normal in all extremities. Finger-to-nose and heel-to-shin performed accurately bilaterally. Gait and Station: Arises from chair without difficulty. Stance is normal. Gait demonstrates slight wide-based and imbalance when he turns.. Able to heel, toe and tandem walk without difficulty.  Reflexes: 1+ and symmetric except ankle jerks are depressed bilaterally.. Toes downgoing.       ASSESSMENT: 73 year old Caucasian male with longstanding chronic sensory peripheral neuropathy from B12 deficiency with subacute worsening of gait ataxia as well as chronic daily headaches and mild cognitive impairment.  Also episode of transient speech difficulties and vision loss and ataxia in March 2020 possibly TIA     PLAN: I had a long discussion with patient and his wife regarding his symptoms of transient gait ataxia which is likely multifactorial due to combination of his underlying B12 deficiency, degenerative cervical spine disease and perhaps vertebral basilar insufficiency.  He also has chronic daily headaches which are mixed migraine headaches with analgesic rebound as well as mild cognitive impairment.  I recommend further evaluation by checking MRI of the brain and neck to look for occlusive posterior circulation disease, EEG and dementia panel labs for reversible causes of cognitive impairment.  Trial of Topamax 50 mg daily for 1 week to be increased to twice daily to help for both his paresthesias from neuropathy as well as chronic daily headache.  I counseled him to cut back using  hydrocodone over the next 2 weeks and discontinue to diminish analgesic rebound headache.  I also recommend he start taking aspirin 81 mg daily for stroke prevention.  Greater than 50% time during this prolonged 60-minute consultation visit was spent on counseling and coordination of care about his multiple symptoms of gait ataxia, cognitive impairment, chronic headaches and paresthesias and answering questions and developing plan of care.  He will return for follow-up in the future in 2 months or call earlier if necessary. Antony Contras, MD  [facility] [address] [place], [place] [address]  Phone [phone] Fax [phone] Note: This document was prepared with digital dictation and possible smart phrase technology. Any transcriptional errors that result from this process are unintentional.

## 2019-04-22 ENCOUNTER — Telehealth: Payer: Self-pay | Admitting: Neurology

## 2019-04-22 LAB — DEMENTIA PANEL
Homocysteine: 12.1 umol/L (ref 0.0–19.2)
RPR Ser Ql: NONREACTIVE
TSH: 2.19 u[IU]/mL (ref 0.450–4.500)
Vitamin B-12: 265 pg/mL (ref 232–1245)

## 2019-04-22 NOTE — Telephone Encounter (Signed)
pending faxed notes  

## 2019-04-26 NOTE — Telephone Encounter (Signed)
Medicare no auth.  [employer] Auth: [id number] (exp. 04/22/19 to 06/06/19) [id number] (exp. 04/22/19 to 06/06/19). Order sent to GI they will reach out to the patient to schedule.

## 2019-04-28 ENCOUNTER — Other Ambulatory Visit: Payer: Medicare Other

## 2019-04-30 NOTE — Telephone Encounter (Signed)
Pt wife is asking for a call to discuss pt having both the MRA and the EEG done at [facility], please call

## 2019-05-03 NOTE — Telephone Encounter (Signed)
I spoke to the patient wife Stanton Kidney he is scheduled at [facility] for 05/10/19 arrival time is 2:30 PM. I gave them their number of [phone] incase they needed to r/s for any reason.

## 2019-05-10 ENCOUNTER — Other Ambulatory Visit: Payer: Self-pay

## 2019-05-10 ENCOUNTER — Ambulatory Visit (HOSPITAL_COMMUNITY): Payer: 59

## 2019-05-10 ENCOUNTER — Ambulatory Visit (HOSPITAL_COMMUNITY)
Admission: RE | Admit: 2019-05-10 | Discharge: 2019-05-10 | Disposition: A | Discharge status: Home / Self Care | Payer: 59 | Source: Ambulatory Visit | Attending: Neurology | Admitting: Neurology

## 2019-05-10 DIAGNOSIS — R9389 Abnormal findings on diagnostic imaging of other specified body structures: Secondary | ICD-10-CM | POA: Insufficient documentation

## 2019-05-10 DIAGNOSIS — R410 Disorientation, unspecified: Secondary | ICD-10-CM | POA: Diagnosis not present

## 2019-05-10 DIAGNOSIS — G3281 Cerebellar ataxia in diseases classified elsewhere: Secondary | ICD-10-CM | POA: Diagnosis not present

## 2019-05-10 LAB — POCT I-STAT CREATININE: Creatinine, Ser: 1.2 mg/dL (ref 0.61–1.24)

## 2019-05-10 MED ORDER — GADOBUTROL 1 MMOL/ML IV SOLN
10.0000 mL | Freq: Once | INTRAVENOUS | Status: AC | PRN
  Administered 2019-05-10: 10 mL via INTRAVENOUS

## 2019-05-13 ENCOUNTER — Ambulatory Visit (HOSPITAL_COMMUNITY)
Admission: RE | Admit: 2019-05-13 | Discharge: 2019-05-13 | Disposition: A | Discharge status: Home / Self Care | Payer: 59 | Source: Ambulatory Visit | Attending: Neurology | Admitting: Neurology

## 2019-05-13 ENCOUNTER — Other Ambulatory Visit: Payer: Self-pay

## 2019-05-13 DIAGNOSIS — G3184 Mild cognitive impairment, so stated: Secondary | ICD-10-CM | POA: Insufficient documentation

## 2019-05-13 DIAGNOSIS — R479 Unspecified speech disturbances: Secondary | ICD-10-CM

## 2019-05-13 NOTE — Progress Notes (Signed)
EEG complete - results pending 

## 2019-05-13 NOTE — Procedures (Signed)
Patient Name: Eduardo [name]  MRN: [id number]  Epilepsy Attending: Lora Havens  Referring Physician/Provider: Dr Antony Contras, MD Date:05/13/19  Duration: 25 mins  Patient history: 73 year old male with episode of speech disturbance in past. MRI Brain neg for stroke. EEG ordered to evaluate.  Level of alertness:   AEDs during EEG study:   Technical aspects: This EEG study was done with scalp electrodes positioned according to the 10-20 International system of electrode placement. Electrical activity was acquired at a sampling rate of 500Hz  and reviewed with a high frequency filter of 70Hz  and a low frequency filter of 1Hz . EEG data were recorded continuously and digitally stored.   BACKGROUND ACTIVITY: Posterior dominant rhythm: The posterior dominant rhythm consists of 9-10 Hz activity of moderate voltage (25-35 uV) seen predominantly in posterior head regions, symmetric and reactive to eye opening and eye closing.             Slowing: None  EPILEPTIFORM ACTIVITY: Interictal epileptiform activity: None  Ictal Activity: None  OTHER EVENTS: None  ACTIVATION PROCEDURES:  Photic driving was seen during photic stimulation .   IMPRESSION: This study is within normal limits. No seizures or epileptiform discharges were seen throughout the recording.  Eduardo [name]

## 2019-06-17 DIAGNOSIS — E663 Overweight: Secondary | ICD-10-CM | POA: Diagnosis not present

## 2019-06-17 DIAGNOSIS — G894 Chronic pain syndrome: Secondary | ICD-10-CM | POA: Diagnosis not present

## 2019-06-17 DIAGNOSIS — E538 Deficiency of other specified B group vitamins: Secondary | ICD-10-CM | POA: Diagnosis not present

## 2019-06-17 DIAGNOSIS — Z6828 Body mass index (BMI) 28.0-28.9, adult: Secondary | ICD-10-CM | POA: Diagnosis not present

## 2019-06-23 NOTE — Telephone Encounter (Signed)
Eduardo [name] from [facility] [phone] called and stated that [facility] is not picking up payment on this because they are stating our office did not add the facility to the [place]. This will need to be updated and [employer] is not allowing her to do it but stating it has to come from the ordering providers office. Please call [employer] at [phone] to add the servicing facility of [facility].

## 2019-06-29 NOTE — Telephone Encounter (Signed)
I spoke with Shirlean Mylar at [facility] and gave her the auth numbers and asked what the facility was on file and she informed me that with his insurance [employer] no sight selection is needed. I left a voicemail with Hilda Blades at [facility] to inform her this and that she could give me a call if she had any questions.

## 2019-07-16 DIAGNOSIS — Z6829 Body mass index (BMI) 29.0-29.9, adult: Secondary | ICD-10-CM | POA: Diagnosis not present

## 2019-07-16 DIAGNOSIS — M1991 Primary osteoarthritis, unspecified site: Secondary | ICD-10-CM | POA: Diagnosis not present

## 2019-07-16 DIAGNOSIS — E538 Deficiency of other specified B group vitamins: Secondary | ICD-10-CM | POA: Diagnosis not present

## 2019-07-16 DIAGNOSIS — G894 Chronic pain syndrome: Secondary | ICD-10-CM | POA: Diagnosis not present

## 2019-11-15 DIAGNOSIS — E538 Deficiency of other specified B group vitamins: Secondary | ICD-10-CM | POA: Diagnosis not present

## 2019-11-15 DIAGNOSIS — G894 Chronic pain syndrome: Secondary | ICD-10-CM | POA: Diagnosis not present

## 2019-11-15 DIAGNOSIS — K219 Gastro-esophageal reflux disease without esophagitis: Secondary | ICD-10-CM | POA: Diagnosis not present

## 2019-11-15 DIAGNOSIS — Z6829 Body mass index (BMI) 29.0-29.9, adult: Secondary | ICD-10-CM | POA: Diagnosis not present

## 2019-11-15 DIAGNOSIS — Z0001 Encounter for general adult medical examination with abnormal findings: Secondary | ICD-10-CM | POA: Diagnosis not present

## 2019-11-15 DIAGNOSIS — Z1389 Encounter for screening for other disorder: Secondary | ICD-10-CM | POA: Diagnosis not present

## 2019-11-18 DIAGNOSIS — E039 Hypothyroidism, unspecified: Secondary | ICD-10-CM | POA: Diagnosis not present

## 2019-11-18 DIAGNOSIS — Z6829 Body mass index (BMI) 29.0-29.9, adult: Secondary | ICD-10-CM | POA: Diagnosis not present

## 2019-11-18 DIAGNOSIS — Z1389 Encounter for screening for other disorder: Secondary | ICD-10-CM | POA: Diagnosis not present

## 2019-11-18 DIAGNOSIS — E538 Deficiency of other specified B group vitamins: Secondary | ICD-10-CM | POA: Diagnosis not present

## 2019-11-18 DIAGNOSIS — Z125 Encounter for screening for malignant neoplasm of prostate: Secondary | ICD-10-CM | POA: Diagnosis not present

## 2019-11-18 DIAGNOSIS — E559 Vitamin D deficiency, unspecified: Secondary | ICD-10-CM | POA: Diagnosis not present

## 2019-11-18 DIAGNOSIS — E663 Overweight: Secondary | ICD-10-CM | POA: Diagnosis not present

## 2019-11-18 DIAGNOSIS — Z0001 Encounter for general adult medical examination with abnormal findings: Secondary | ICD-10-CM | POA: Diagnosis not present

## 2019-12-24 DIAGNOSIS — R7309 Other abnormal glucose: Secondary | ICD-10-CM | POA: Diagnosis not present

## 2020-01-13 DIAGNOSIS — R109 Unspecified abdominal pain: Secondary | ICD-10-CM | POA: Diagnosis not present

## 2020-01-13 DIAGNOSIS — E538 Deficiency of other specified B group vitamins: Secondary | ICD-10-CM | POA: Diagnosis not present

## 2020-01-13 DIAGNOSIS — E039 Hypothyroidism, unspecified: Secondary | ICD-10-CM | POA: Diagnosis not present

## 2020-01-13 DIAGNOSIS — C189 Malignant neoplasm of colon, unspecified: Secondary | ICD-10-CM | POA: Diagnosis not present

## 2020-01-13 DIAGNOSIS — M1991 Primary osteoarthritis, unspecified site: Secondary | ICD-10-CM | POA: Diagnosis not present

## 2020-01-14 ENCOUNTER — Other Ambulatory Visit: Payer: Self-pay | Admitting: Physician Assistant

## 2020-01-14 ENCOUNTER — Other Ambulatory Visit (HOSPITAL_COMMUNITY): Payer: Self-pay | Admitting: Physician Assistant

## 2020-01-14 DIAGNOSIS — R109 Unspecified abdominal pain: Secondary | ICD-10-CM

## 2020-01-19 ENCOUNTER — Ambulatory Visit (HOSPITAL_COMMUNITY)
Admission: RE | Admit: 2020-01-19 | Discharge: 2020-01-19 | Disposition: A | Discharge status: Home / Self Care | Payer: 59 | Source: Ambulatory Visit | Attending: Physician Assistant | Admitting: Physician Assistant

## 2020-01-19 ENCOUNTER — Other Ambulatory Visit: Payer: Self-pay

## 2020-01-19 DIAGNOSIS — R109 Unspecified abdominal pain: Secondary | ICD-10-CM | POA: Diagnosis not present

## 2020-01-19 DIAGNOSIS — N200 Calculus of kidney: Secondary | ICD-10-CM | POA: Diagnosis not present

## 2020-01-25 ENCOUNTER — Ambulatory Visit (INDEPENDENT_AMBULATORY_CARE_PROVIDER_SITE_OTHER): Payer: 59 | Admitting: Urology

## 2020-01-25 ENCOUNTER — Other Ambulatory Visit: Payer: Self-pay | Admitting: Urology

## 2020-01-25 ENCOUNTER — Encounter: Payer: Self-pay | Admitting: Urology

## 2020-01-25 ENCOUNTER — Other Ambulatory Visit: Payer: Self-pay

## 2020-01-25 VITALS — BP 152/96 | HR 98 | Temp 97.7°F | Ht 74.0 in | Wt 215.0 lb

## 2020-01-25 DIAGNOSIS — R972 Elevated prostate specific antigen [PSA]: Secondary | ICD-10-CM | POA: Diagnosis not present

## 2020-01-25 LAB — POCT URINALYSIS DIPSTICK
Bilirubin, UA: NEGATIVE
Glucose, UA: NEGATIVE
Ketones, UA: NEGATIVE
Leukocytes, UA: NEGATIVE
Nitrite, UA: NEGATIVE
Protein, UA: POSITIVE — AB
Spec Grav, UA: >=1.03 — AB (ref 1.010–1.025)
Urobilinogen, UA: 0.2 E.U./dL
pH, UA: 5 (ref 5.0–8.0)

## 2020-01-25 LAB — PSA: PSA: 5.6 ng/mL — ABNORMAL HIGH (ref <=4.0)

## 2020-01-25 NOTE — Progress Notes (Signed)

## 2020-01-25 NOTE — Progress Notes (Signed)
H&P  Chief Complaint: Elevated PSA  History of Present Illness: 74 year old male with minimal prior urologic history, sent by Dr. Riley Kill for evaluation and management of elevated PSA.  The patient apparently had this drawn about a month ago.  He was told that he had an elevated level and that urologic consultation was requested.  He does not know if he has ever had PSA levels checked in the past.  He denies family history of prostate cancer.  He did have urinary tract infection with postoperative urinary retention about 5 years ago, seen by Dr. Irine Seal.  He has not had any urinary tract infections or voiding symptoms since that time.  I do not have any old records either here in epic records or in our urochart.  Past Medical History:  Diagnosis Date  . Chronic pain syndrome   . Colon cancer ([place]) 10/20/13   colon resection-   . DDD (degenerative disc disease), cervical   . GERD (gastroesophageal reflux disease)   . History of kidney stones    seen on imaging but never knowingly passed spontaneously. Never had any manipulation surgery/procedure for it.   Mar[place] Kitchen History of seasonal allergies   . Kidney stone   . Kidney stones   . Migraine    "maybe 1-2/month" (06/20/2015)  . Neuropathy 2010  . Primary osteoarthritis   . Skin cancer of face    "froze them off"  . Stroke ([place])   . Wears glasses     Past Surgical History:  Procedure Laterality Date  . ANTERIOR CERVICAL DECOMP/DISCECTOMY FUSION N/A 09/26/2017   Procedure: ACDF - C3-C4 - C5-C6 - C6-C7;  Surgeon: Earnie Larsson, MD;  Location: [facility];  Service: Neurosurgery;  Laterality: N/A;  . APPENDECTOMY  10/20/13   (with right hemicolectomy)  . CARPAL TUNNEL RELEASE Left 2011  . CARPAL TUNNEL RELEASE Right 01/07/2013   Procedure: CARPAL TUNNEL RELEASE;  Surgeon: Tennis Must, MD;  Location: [facility];  Service: Orthopedics;  Laterality: Right;  . COLON RESECTION N/A 10/20/2013   Procedure:  LAPAROSCOPIC hand assisted  partial colectomy;  Surgeon: Jamesetta So, MD;  Location: [facility];  Service: General;  Laterality: N/A;  . COLON SURGERY  2014   x2- 2nd. to correct a perforation , treated with TPN & drain & then later had to remove more colon   . COLONOSCOPY    . COLONOSCOPY N/A 10/18/2013   Procedure: COLONOSCOPY;  Surgeon: Danie Binder, MD;  [facility];  Service: Endoscopy;  Laterality: N/A;  2:00-moved to 1030 Pam to notify pt  . COLONOSCOPY N/A 09/22/2014   Procedure: COLONOSCOPY;  Surgeon: Rogene Houston, MD;  [facility];  Service: Endoscopy;  Laterality: N/A;  1200  . COLONOSCOPY N/A 11/20/2017   Procedure: COLONOSCOPY;  Surgeon: Rogene Houston, MD;  [facility];  Service: Endoscopy;  Laterality: N/A;  955  . ESOPHAGOGASTRODUODENOSCOPY N/A 06/19/2016   Procedure: ESOPHAGOGASTRODUODENOSCOPY (EGD);  Surgeon: Rogene Houston, MD;  [facility];  Service: Endoscopy;  Laterality: N/A;  3:00  . EVALUATION UNDER ANESTHESIA WITH HEMORRHOIDECTOMY N/A 08/06/2018   Procedure: [place];  Surgeon: Erroll Luna, MD;  Location: [facility];  Service: General;  Laterality: N/A;  . EXCISIONAL HEMORRHOIDECTOMY    . INCISIONAL HERNIA REPAIR  06/20/2015   WITH MYOFASCIAL ADVANCEMENT FLAP AND MESH   . INCISIONAL HERNIA REPAIR N/A 06/20/2015   Procedure: INCISIONAL HERNIA REPAIR WITH  MYOFASCIAL ADVANCEMENT FLAP AND MESH;  Surgeon: Erroll Luna, MD;  Location: [facility];  Service: General;  Laterality: N/A;  . INGUINAL HERNIA REPAIR Left    "@ [facility]"  . INGUINAL HERNIA REPAIR Bilateral    "[place] [facility]"  . INSERTION OF MESH N/A 06/20/2015   Procedure: INSERTION OF MESH;  Surgeon: Erroll Luna, MD;  Location: [facility];  Service: General;  Laterality: N/A;  . KNEE ARTHROSCOPY Bilateral 2001  . NASAL SEPTUM SURGERY  1980's  . TAKE DOWN OF INTESTINAL FISTULA N/A 02/02/2014   Procedure: TAKE DOWN OF ENTEROCUTANEOUS   FISTULA;  Surgeon: Marcello Moores A. Cornett, MD;  Location: [facility];  Service: General;  Laterality: N/A;  . TONSILLECTOMY    . UPPER GI ENDOSCOPY      Home Medications:  Allergies as of 01/25/2020      Reactions   Niaspan [niacin Er] Rash   Tape Rash, Other (See Comments)   Paper tape OK      Medication List       Accurate as of January 25, 2020  3:02 PM. If you have any questions, ask your nurse or doctor.        STOP taking these medications   ALBUTEROL SULFATE PO Stopped by: Jorja Loa, MD   diclofenac sodium 1 % Gel Commonly known as: VOLTAREN Stopped by: Jorja Loa, MD   ibuprofen 800 MG tablet Commonly known as: ADVIL Stopped by: Jorja Loa, MD   pregabalin 75 MG capsule Commonly known as: LYRICA Stopped by: Jorja Loa, MD     TAKE these medications   amoxicillin 500 MG tablet Commonly known as: AMOXIL Take 500 mg by mouth 2 (two) times daily.   aspirin EC 81 MG tablet Take 1 tablet (81 mg total) by mouth daily.   clarithromycin 500 MG tablet Commonly known as: BIAXIN Take 500 mg by mouth 2 (two) times daily.   clotrimazole-betamethasone cream Commonly known as: LOTRISONE APPLY EXTERNALLY TO THE AFFECTED AND SURROUNDING AREAS TWICE DAILY IN THE MORNING AND IN THE EVENING   cyanocobalamin 1000 MCG/ML injection Commonly known as: (VITAMIN B-12) Inject 1,000 mcg into the muscle every 30 (thirty) days.   EPINEPHrine 0.3 mg/0.3 mL [place] injection Commonly known as: EPI-PEN Inject 0.3 mLs (0.3 mg total) into the muscle once.   FISH OIL PO Take 1,000 mg by mouth.   fluticasone 50 MCG/ACT nasal spray Commonly known as: FLONASE Place 2 sprays into both nostrils 2 (two) times daily as needed for allergies.   HYDROcodone-acetaminophen 5-325 MG tablet Commonly known as: NORCO/VICODIN Take 1 tablet by mouth every 6 (six) hours as needed for moderate pain.   levothyroxine 50 MCG tablet Commonly known as: SYNTHROID Take 50 mcg by  mouth daily.   ranitidine 300 MG tablet Commonly known as: ZANTAC Take 300 mg by mouth daily as needed for heartburn.   rizatriptan 10 MG tablet Commonly known as: MAXALT Take 10 mg by mouth as needed for migraine. May repeat in 2 hours if needed   topiramate 50 MG tablet Commonly known as: Topamax Take 1 tablet (50 mg total) by mouth 2 (two) times daily. Start 1 tablet daily x 1 week and then twice daily       Allergies:  Allergies  Allergen Reactions  . Niaspan [Niacin Er] Rash  . Tape Rash and Other (See Comments)    Paper tape OK    Family History  Problem Relation Age of Onset  . Colon polyps Father   .  Colon polyps Paternal Uncle   . Colon polyps Maternal Grandfather     Social History:  reports that he has never smoked. He has never used smokeless tobacco. He reports that he does not drink alcohol or use drugs.  ROS: A complete review of systems was performed.  All systems are negative except for pertinent findings as noted.  Physical Exam:  Vital signs in last 24 hours: BP (!) 152/96   Pulse 98   Temp 97.7 F (36.5 C)   Ht 6\' 2"  (1.88 m)   Wt 215 lb (97.5 kg)   BMI 27.60 kg/m  Constitutional:  Alert and oriented, No acute distress Cardiovascular: Regular rate  Respiratory: Normal respiratory effort GI: Abdomen is soft, nontender, nondistended, no abdominal masses. No CVAT.  Genitourinary: Normal male phallus (uncircumcised), testes are descended bilaterally and non-tender and without masses, scrotum is normal in appearance without lesions or masses, perineum is normal on inspection. Rectal: Mild anal stenosis.  Prostate 20 to 30 g, symmetric, nonnodular, nontender. Lymphatic: No lymphadenopathy Neurologic: Grossly intact, no focal deficits Psychiatric: Normal mood and affect  Laboratory Data:  I reviewed the patient's old records.  Recent CT scan was reviewed.  He does have bilateral renal calculi, mostly small.  Results for orders placed or  performed in visit on 01/25/20 (from the past 24 hour(s))  POCT urinalysis dipstick     Status: Abnormal   Collection Time: 01/25/20  2:39 PM  Result Value Ref Range   Color, UA dark yellow    Clarity, UA     Glucose, UA Negative Negative   Bilirubin, UA neg    Ketones, UA neg    Spec Grav, UA >=1.030 (A) 1.010 - 1.025   Blood, UA trace intact    pH, UA 5.0 5.0 - 8.0   Protein, UA Positive (A) Negative   Urobilinogen, UA 0.2 0.2 or 1.0 E.U./dL   Nitrite, UA neg    Leukocytes, UA Negative Negative   Appearance clear    Odor     No results found for this or any previous visit (from the past 240 hour(s)).  Renal Function: No results for input(s): CREATININE in the last 168 hours. CrCl cannot be calculated (Patient's most recent lab result is older than the maximum 21 days allowed.).  Radiologic Imaging: No results found.  Impression/Assessment:  1.  Elevated PSA.  He has a fairly normal exam today.  I have no records whatsoever or prior PSAs available  2.  Bilateral renal calculi, asymptomatic  Plan:  1.  I did give him stone prevention dietary guidelines  2.  PSA will be drawn today.  I will call with results  3.  We will obtain old PSA records from [facility]

## 2020-01-26 ENCOUNTER — Other Ambulatory Visit: Payer: Self-pay

## 2020-02-01 ENCOUNTER — Telehealth: Payer: Self-pay

## 2020-02-01 ENCOUNTER — Other Ambulatory Visit: Payer: Self-pay | Admitting: Urology

## 2020-02-01 DIAGNOSIS — R972 Elevated prostate specific antigen [PSA]: Secondary | ICD-10-CM

## 2020-02-01 MED ORDER — LEVOFLOXACIN 750 MG PO TABS: 750.0000 mg | ORAL_TABLET | Freq: Every day | ORAL | 0 refills | Status: AC

## 2020-02-01 NOTE — Telephone Encounter (Signed)
Left message to return call 

## 2020-02-01 NOTE — Telephone Encounter (Signed)
-----   Message from Franchot Gallo, MD sent at 02/01/2020  8:36 AM EDT ----- Notify patient PSA elevated at 5.6.  He needs ultrasound and biopsy.  Please schedule.  Orders put in. ----- Message ----- From: Dorisann Frames, RN Sent: 01/26/2020  12:32 PM EDT To: Franchot Gallo, MD  Please review

## 2020-02-02 ENCOUNTER — Telehealth: Payer: Self-pay

## 2020-02-02 ENCOUNTER — Other Ambulatory Visit: Payer: Self-pay

## 2020-02-02 ENCOUNTER — Other Ambulatory Visit: Payer: Self-pay | Admitting: Urology

## 2020-02-02 DIAGNOSIS — R972 Elevated prostate specific antigen [PSA]: Secondary | ICD-10-CM

## 2020-02-02 NOTE — Telephone Encounter (Signed)
Pt. called with questions concerning abt. Pt. made aware he is to only take 1 750mg  tablet prior to procedure. Pt.voiced understanding.

## 2020-02-02 NOTE — Telephone Encounter (Signed)
I spoke with pt regarding psa results. Biopsy scheduled with pt. Instructions reviewed with pt and mailed at pt request.

## 2020-03-07 ENCOUNTER — Ambulatory Visit (INDEPENDENT_AMBULATORY_CARE_PROVIDER_SITE_OTHER): Payer: 59 | Admitting: Urology

## 2020-03-07 ENCOUNTER — Ambulatory Visit (HOSPITAL_COMMUNITY)
Admission: RE | Admit: 2020-03-07 | Discharge: 2020-03-07 | Disposition: A | Discharge status: Home / Self Care | Payer: 59 | Source: Ambulatory Visit | Attending: Urology | Admitting: Urology

## 2020-03-07 ENCOUNTER — Other Ambulatory Visit: Payer: Self-pay

## 2020-03-07 ENCOUNTER — Other Ambulatory Visit: Payer: Self-pay | Admitting: Urology

## 2020-03-07 DIAGNOSIS — C61 Malignant neoplasm of prostate: Secondary | ICD-10-CM | POA: Diagnosis not present

## 2020-03-07 DIAGNOSIS — R972 Elevated prostate specific antigen [PSA]: Secondary | ICD-10-CM | POA: Diagnosis not present

## 2020-03-07 MED ORDER — GENTAMICIN SULFATE 40 MG/ML IJ SOLN: 160.0000 mg | Freq: Once | INTRAMUSCULAR | Status: AC

## 2020-03-07 MED ORDER — LIDOCAINE HCL (PF) 2 % IJ SOLN
INTRAMUSCULAR | Status: AC
  Filled 2020-03-07: qty 10

## 2020-03-07 MED ORDER — GENTAMICIN SULFATE 40 MG/ML IJ SOLN
INTRAMUSCULAR | Status: AC
  Administered 2020-03-07: 160 mg via INTRAMUSCULAR
  Filled 2020-03-07: qty 4

## 2020-03-07 NOTE — Progress Notes (Signed)
Risks, benefits, and some of the potential complications of a transrectal ultrasounds of the prostate (TRUSP) with biopsies were discussed at length with the patient including gross hematuria, blood in the bowel movements, hematospermia, bacteremia, infection, voiding discomfort, urinary retention, fever, chills, sepsis, blood transfusion, death, and others. All questions were answered. Informed consent was obtained. The patient confirmed that he had taken his pre-procedure antibiotic. All anticoagulants were discontinued prior to the procedure. The patient emptied his bladder. He was positioned in a comfortable left lateral decubitus position with hips and knees acutely flexed.  The rectal probe was inserted into the rectum without difficulty. 10cc of 2% Lidocaine without epinephrine was instilled with a spinal needle using ultrasound guidance near the junction of each seminal vesicle and the prostate.  Sequential transverse (axial) scans were made in small increments beginning at the seminal vesicles and ending at the prostatic apex. Sequential longitudinal (saggital) scans were made in small increments beginning at the right lateral prostate and ending at the left lateral prostate. Excellent anatomical imaging was obtained. The peripheral, transitional, and central zones were well-defined. The seminal vesicles were normal.~~  Prostate volume 23.76ml.  There were no hypoechoic areas. 12 biopsies were performed. 1 biopsy each was taken from the following areas:  Right lateral base, right medial base, right lateral mid prostate, right medial mid prostate, right lateral apical prostate, right medial apical prostate, left lateral base, left medial base, left lateral mid prostate, left medial mid prostate, left lateral apical prostate, left medial apical prostate.. Minimal prostatic calcifications were noted. Excellent biopsy specimens were obtained.  Follow-up rectal examination was unremarkable. The  procedure was well-tolerated and without complications. Antibiotic instructions were given. The patient was told that:  For several days:  he should increase his fluid intake and limit strenuous activity  he might have mild discomfort at the base of his penis or in his rectum  he might have blood in his urine or blood in his bowel movements  For 2-3 months:  he might have blood in his ejaculate (semen)  Instructions were given to call the office immedicately for blood clots in the urine or bowel movements, difficulty urinating, inability to urinate, urinary retention, painful or frequent urination, fever, chills, nausea, vomiting, or other illness. The patient stated that he understood these instructions and would comply with them. We told the patient that prostate biopsy pathology reports are usually available within 3-5 working days, unless a pathologic second opinion is required, which may take 7-14 days. We told him to contact us to check on the status of his biopsy if he has not heard from [place] within 7 days. The patient left the ultrasound examination room in stable condition.

## 2020-03-09 ENCOUNTER — Other Ambulatory Visit: Payer: Self-pay | Admitting: Urology

## 2020-03-09 ENCOUNTER — Telehealth: Payer: Self-pay

## 2020-03-09 DIAGNOSIS — C61 Malignant neoplasm of prostate: Secondary | ICD-10-CM

## 2020-03-09 NOTE — Telephone Encounter (Signed)
-----   Message from Franchot Gallo, MD sent at 03/09/2020  2:18 PM EDT ----- I called pt w/ + bx results--needs imaging--orders put in--as well as PCa conference

## 2020-03-09 NOTE — Telephone Encounter (Signed)
Pt made aware and appt made.

## 2020-03-10 ENCOUNTER — Telehealth: Payer: Self-pay | Admitting: Urology

## 2020-03-10 NOTE — Telephone Encounter (Signed)
I called [employer] for Eduardo [name] for Bone scan and CT. They both require clinical review questions. She said you could call the clinical line at [phone]. refer to case [id number] for CT (CPT [phone]). Also [id number] for Bone scan (CPT [phone])

## 2020-03-13 NOTE — Telephone Encounter (Signed)
Eduardo [name] with [facility] provided for [phone]- [id number] valid 03/13/20-04/27/20 CPT [id number]  # [id number] valid 5/24-04/27/20

## 2020-03-15 ENCOUNTER — Ambulatory Visit (INDEPENDENT_AMBULATORY_CARE_PROVIDER_SITE_OTHER): Payer: 59 | Admitting: Gastroenterology

## 2020-03-16 ENCOUNTER — Encounter (HOSPITAL_COMMUNITY): Payer: Self-pay

## 2020-03-16 ENCOUNTER — Emergency Department (HOSPITAL_COMMUNITY): Payer: 59

## 2020-03-16 ENCOUNTER — Observation Stay (HOSPITAL_COMMUNITY)
Admission: EM | Admit: 2020-03-16 | Discharge: 2020-03-17 | Disposition: A | Discharge status: Home / Self Care | Payer: 59 | Attending: Internal Medicine | Admitting: Internal Medicine

## 2020-03-16 ENCOUNTER — Other Ambulatory Visit: Payer: Self-pay

## 2020-03-16 DIAGNOSIS — Z79899 Other long term (current) drug therapy: Secondary | ICD-10-CM | POA: Diagnosis not present

## 2020-03-16 DIAGNOSIS — E876 Hypokalemia: Secondary | ICD-10-CM

## 2020-03-16 DIAGNOSIS — J1282 Pneumonia due to coronavirus disease 2019: Secondary | ICD-10-CM

## 2020-03-16 DIAGNOSIS — Z8673 Personal history of transient ischemic attack (TIA), and cerebral infarction without residual deficits: Secondary | ICD-10-CM | POA: Insufficient documentation

## 2020-03-16 DIAGNOSIS — E860 Dehydration: Secondary | ICD-10-CM | POA: Diagnosis not present

## 2020-03-16 DIAGNOSIS — U071 COVID-19: Secondary | ICD-10-CM

## 2020-03-16 DIAGNOSIS — E039 Hypothyroidism, unspecified: Secondary | ICD-10-CM

## 2020-03-16 DIAGNOSIS — Z7982 Long term (current) use of aspirin: Secondary | ICD-10-CM | POA: Insufficient documentation

## 2020-03-16 DIAGNOSIS — C61 Malignant neoplasm of prostate: Secondary | ICD-10-CM | POA: Diagnosis present

## 2020-03-16 DIAGNOSIS — R531 Weakness: Secondary | ICD-10-CM

## 2020-03-16 DIAGNOSIS — Z85828 Personal history of other malignant neoplasm of skin: Secondary | ICD-10-CM | POA: Insufficient documentation

## 2020-03-16 LAB — CBC WITH DIFFERENTIAL/PLATELET
Abs Immature Granulocytes: 0.02 10*3/uL (ref 0.00–0.07)
Basophils Absolute: 0 10*3/uL (ref 0.0–0.1)
Basophils Relative: 0 %
Eosinophils Absolute: 0 10*3/uL (ref 0.0–0.5)
Eosinophils Relative: 0 %
HCT: 39 % (ref 39.0–52.0)
Hemoglobin: 13.7 g/dL (ref 13.0–17.0)
Immature Granulocytes: 1 %
Lymphocytes Relative: 25 %
Lymphs Abs: 1.1 10*3/uL (ref 0.7–4.0)
MCH: 31.1 pg (ref 26.0–34.0)
MCHC: 35.1 g/dL (ref 30.0–36.0)
MCV: 88.6 fL (ref 80.0–100.0)
Monocytes Absolute: 0.7 10*3/uL (ref 0.1–1.0)
Monocytes Relative: 15 %
Neutro Abs: 2.6 10*3/uL (ref 1.7–7.7)
Neutrophils Relative %: 59 %
Platelets: 178 10*3/uL (ref 150–400)
RBC: 4.4 MIL/uL (ref 4.22–5.81)
RDW: 12.7 % (ref 11.5–15.5)
WBC: 4.3 10*3/uL (ref 4.0–10.5)
nRBC: 0 % (ref 0.0–0.2)

## 2020-03-16 LAB — COMPREHENSIVE METABOLIC PANEL
ALT: 21 U/L (ref 0–44)
AST: 26 U/L (ref 15–41)
Albumin: 3.6 g/dL (ref 3.5–5.0)
Alkaline Phosphatase: 60 U/L (ref 38–126)
Anion gap: 11 (ref 5–15)
BUN: 15 mg/dL (ref 8–23)
CO2: 24 mmol/L (ref 22–32)
Calcium: 8.6 mg/dL — ABNORMAL LOW (ref 8.9–10.3)
Chloride: 101 mmol/L (ref 98–111)
Creatinine, Ser: 1.13 mg/dL (ref 0.61–1.24)
GFR calc Af Amer: >60 mL/min (ref >60)
GFR calc non Af Amer: >60 mL/min (ref >60)
Glucose, Bld: 107 mg/dL — ABNORMAL HIGH (ref 70–99)
Potassium: 2.5 mmol/L — CL (ref 3.5–5.1)
Sodium: 136 mmol/L (ref 135–145)
Total Bilirubin: 1 mg/dL (ref 0.3–1.2)
Total Protein: 7.2 g/dL (ref 6.5–8.1)

## 2020-03-16 LAB — FERRITIN: Ferritin: 249 ng/mL (ref 24–336)

## 2020-03-16 LAB — C-REACTIVE PROTEIN: CRP: 1.6 mg/dL — ABNORMAL HIGH (ref <1.0)

## 2020-03-16 LAB — URINALYSIS, ROUTINE W REFLEX MICROSCOPIC
Bacteria, UA: NONE SEEN
Bilirubin Urine: NEGATIVE
Glucose, UA: NEGATIVE mg/dL
Ketones, ur: NEGATIVE mg/dL
Leukocytes,Ua: NEGATIVE
Nitrite: NEGATIVE
Protein, ur: NEGATIVE mg/dL
Specific Gravity, Urine: 1.041 — ABNORMAL HIGH (ref 1.005–1.030)
pH: 6 (ref 5.0–8.0)

## 2020-03-16 LAB — D-DIMER, QUANTITATIVE: D-Dimer, Quant: 0.49 ug/mL-FEU (ref 0.00–0.50)

## 2020-03-16 LAB — SARS CORONAVIRUS 2 BY RT PCR (HOSPITAL ORDER, PERFORMED IN ~~LOC~~ HOSPITAL LAB): SARS Coronavirus 2: POSITIVE — AB

## 2020-03-16 LAB — PHOSPHORUS
Phosphorus: 1.5 mg/dL — ABNORMAL LOW (ref 2.5–4.6)
Phosphorus: 2.7 mg/dL (ref 2.5–4.6)

## 2020-03-16 LAB — ABO/RH: ABO/RH(D): O POS

## 2020-03-16 LAB — TSH: TSH: 1.812 u[IU]/mL (ref 0.350–4.500)

## 2020-03-16 LAB — MAGNESIUM: Magnesium: 1.9 mg/dL (ref 1.7–2.4)

## 2020-03-16 LAB — PROCALCITONIN: Procalcitonin: <0.1 ng/mL

## 2020-03-16 MED ORDER — FAMOTIDINE 20 MG PO TABS
20.0000 mg | ORAL_TABLET | Freq: Two times a day (BID) | ORAL | Status: DC
  Administered 2020-03-16 – 2020-03-17 (×2): 20 mg via ORAL
  Filled 2020-03-16 (×2): qty 1

## 2020-03-16 MED ORDER — ONDANSETRON HCL 4 MG/2ML IJ SOLN
4.0000 mg | Freq: Four times a day (QID) | INTRAMUSCULAR | Status: DC | PRN
  Administered 2020-03-17: 4 mg via INTRAVENOUS
  Filled 2020-03-16: qty 2

## 2020-03-16 MED ORDER — FLUTICASONE PROPIONATE 50 MCG/ACT NA SUSP: 2.0000 | Freq: Two times a day (BID) | NASAL | Status: DC | PRN

## 2020-03-16 MED ORDER — HYDROCODONE-ACETAMINOPHEN 5-325 MG PO TABS: 1.0000 | ORAL_TABLET | Freq: Four times a day (QID) | ORAL | Status: DC | PRN

## 2020-03-16 MED ORDER — SODIUM CHLORIDE 0.9 % IV SOLN
200.0000 mg | Freq: Once | INTRAVENOUS | Status: AC
  Administered 2020-03-16: 200 mg via INTRAVENOUS
  Filled 2020-03-16: qty 40

## 2020-03-16 MED ORDER — ALBUTEROL SULFATE HFA 108 (90 BASE) MCG/ACT IN AERS: 2.0000 | INHALATION_SPRAY | Freq: Four times a day (QID) | RESPIRATORY_TRACT | Status: DC | PRN

## 2020-03-16 MED ORDER — ENOXAPARIN SODIUM 40 MG/0.4ML ~~LOC~~ SOLN
40.0000 mg | SUBCUTANEOUS | Status: DC
  Administered 2020-03-16: 40 mg via SUBCUTANEOUS
  Filled 2020-03-16: qty 0.4

## 2020-03-16 MED ORDER — SODIUM CHLORIDE 0.9 % IV BOLUS
500.0000 mL | Freq: Once | INTRAVENOUS | Status: AC
  Administered 2020-03-16: 500 mL via INTRAVENOUS

## 2020-03-16 MED ORDER — POTASSIUM CHLORIDE 10 MEQ/100ML IV SOLN
10.0000 meq | INTRAVENOUS | Status: AC
  Administered 2020-03-16 (×6): 10 meq via INTRAVENOUS
  Filled 2020-03-16 (×6): qty 100

## 2020-03-16 MED ORDER — POTASSIUM CHLORIDE CRYS ER 20 MEQ PO TBCR
40.0000 meq | EXTENDED_RELEASE_TABLET | Freq: Once | ORAL | Status: AC
  Administered 2020-03-16: 40 meq via ORAL
  Filled 2020-03-16: qty 2

## 2020-03-16 MED ORDER — IOHEXOL 300 MG/ML  SOLN
100.0000 mL | Freq: Once | INTRAMUSCULAR | Status: AC | PRN
  Administered 2020-03-16: 100 mL via INTRAVENOUS

## 2020-03-16 MED ORDER — PHOSPHA 250 NEUTRAL 155-852-130 MG PO TABS
500.0000 mg | ORAL_TABLET | Freq: Once | ORAL | Status: AC
  Administered 2020-03-16: 500 mg via ORAL
  Filled 2020-03-16: qty 2

## 2020-03-16 MED ORDER — POTASSIUM CHLORIDE CRYS ER 20 MEQ PO TBCR
40.0000 meq | EXTENDED_RELEASE_TABLET | Freq: Two times a day (BID) | ORAL | Status: DC
  Administered 2020-03-16 – 2020-03-17 (×2): 40 meq via ORAL
  Filled 2020-03-16 (×3): qty 2

## 2020-03-16 MED ORDER — SODIUM CHLORIDE 0.9 % IV SOLN: 100.0000 mg | Freq: Every day | INTRAVENOUS | Status: DC

## 2020-03-16 MED ORDER — ACETAMINOPHEN 325 MG PO TABS
650.0000 mg | ORAL_TABLET | Freq: Four times a day (QID) | ORAL | Status: DC | PRN
  Administered 2020-03-17: 650 mg via ORAL
  Filled 2020-03-16: qty 2

## 2020-03-16 MED ORDER — REMDESIVIR 100 MG IV SOLR
100.0000 mg | INTRAVENOUS | Status: DC
  Filled 2020-03-16: qty 20

## 2020-03-16 MED ORDER — SODIUM CHLORIDE 0.9 % IV SOLN: 200.0000 mg | Freq: Once | INTRAVENOUS | Status: DC

## 2020-03-16 MED ORDER — ONDANSETRON HCL 4 MG PO TABS: 4.0000 mg | ORAL_TABLET | Freq: Four times a day (QID) | ORAL | Status: DC | PRN

## 2020-03-16 MED ORDER — POTASSIUM PHOSPHATES 15 MMOLE/5ML IV SOLN
45.0000 mmol | Freq: Once | INTRAVENOUS | Status: AC
  Administered 2020-03-16: 45 mmol via INTRAVENOUS
  Filled 2020-03-16: qty 15

## 2020-03-16 MED ORDER — ALBUTEROL SULFATE HFA 108 (90 BASE) MCG/ACT IN AERS
2.0000 | INHALATION_SPRAY | Freq: Four times a day (QID) | RESPIRATORY_TRACT | Status: DC
  Administered 2020-03-16: 2 via RESPIRATORY_TRACT
  Filled 2020-03-16: qty 6.7

## 2020-03-16 MED ORDER — SODIUM CHLORIDE 0.9 % IV SOLN: 100.0000 mg | INTRAVENOUS | Status: DC

## 2020-03-16 MED ORDER — SODIUM CHLORIDE 0.9 % IV SOLN
100.0000 mg | Freq: Every day | INTRAVENOUS | Status: DC
  Administered 2020-03-17: 100 mg via INTRAVENOUS
  Filled 2020-03-16 (×2): qty 20

## 2020-03-16 MED ORDER — LEVOTHYROXINE SODIUM 50 MCG PO TABS
50.0000 ug | ORAL_TABLET | Freq: Every day | ORAL | Status: DC
  Administered 2020-03-17: 50 ug via ORAL
  Filled 2020-03-16: qty 1

## 2020-03-16 NOTE — ED Notes (Signed)
ED TO INPATIENT HANDOFF REPORT  ED Nurse Name and Phone #:  Fabio Neighbors RN [phone]  S Name/Age/Gender Eduardo [name] 74 y.o. male Room/Bed: APA03/APA03  Code Status   Code Status: Prior  Home/SNF/Other Home Patient oriented to: self, place, time and situation Is this baseline? Yes   Triage Complete: Triage complete  Chief Complaint Hypokalemia [E87.6]  Triage Note Pt reports dizziness and generalized weakness for the past few days.  Reports had H pylori approx 6 weeks ago and has been on antibiotics for approx 3 weeks.  Then recently diagnosed with prostate cancer and was put on antibiotics prior to his biopsy.  Pt says started getting sick around the time of his biopsy.      Allergies Allergies  Allergen Reactions  . Niaspan [Niacin Er] Rash  . Tape Rash and Other (See Comments)    Paper tape OK    Level of Care/Admitting Diagnosis ED Disposition    ED Disposition Condition Comment   Admit  Hospital Area: [facility] [id number]  Level of Care: Telemetry [5]  Covid Evaluation: Asymptomatic Screening Protocol (No Symptoms)  Diagnosis: Hypokalemia [id number]  Admitting Physician: [place]  Attending Physician: Kathie Dike [[id number]]       B Medical/Surgery History Past Medical History:  Diagnosis Date  . Chronic pain syndrome   . Colon cancer ([place]) 10/20/13   colon resection-   . DDD (degenerative disc disease), cervical   . GERD (gastroesophageal reflux disease)   . History of kidney stones    seen on imaging but never knowingly passed spontaneously. Never had any manipulation surgery/procedure for it.   Marland Kitchen History of seasonal allergies   . Kidney stone   . Kidney stones   . Migraine    "maybe 1-2/month" (06/20/2015)  . Neuropathy 2010  . Primary osteoarthritis   . Skin cancer of face    "froze them off"  . Stroke [facility])    pt denies  . Wears glasses    Past Surgical History:  Procedure Laterality Date  . ANTERIOR CERVICAL  DECOMP/DISCECTOMY FUSION N/A 09/26/2017   Procedure: ACDF - C3-C4 - C5-C6 - C6-C7;  Surgeon: Earnie Larsson, MD;  Location: [place];  Service: Neurosurgery;  Laterality: N/A;  . APPENDECTOMY  10/20/13   (with right hemicolectomy)  . CARPAL TUNNEL RELEASE Left 2011  . CARPAL TUNNEL RELEASE Right 01/07/2013   Procedure: CARPAL TUNNEL RELEASE;  Surgeon: Tennis Must, MD;  Location: [place];  Service: Orthopedics;  Laterality: Right;  . COLON RESECTION N/A 10/20/2013   Procedure:  LAPAROSCOPIC hand assisted partial colectomy;  Surgeon: Jamesetta So, MD;  Location: [facility];  Service: General;  Laterality: N/A;  . COLON SURGERY  2014   x2- 2nd. to correct a perforation , treated with TPN & drain & then later had to remove more colon   . COLONOSCOPY    . COLONOSCOPY N/A 10/18/2013   Procedure: COLONOSCOPY;  Surgeon: Danie Binder, MD;  [facility];  Service: Endoscopy;  Laterality: N/A;  2:00-moved to 1030 Pam to notify pt  . COLONOSCOPY N/A 09/22/2014   Procedure: COLONOSCOPY;  Surgeon: Rogene Houston, MD;  [facility];  Service: Endoscopy;  Laterality: N/A;  1200  . COLONOSCOPY N/A 11/20/2017   Procedure: COLONOSCOPY;  Surgeon: Rogene Houston, MD;  [facility];  Service: Endoscopy;  Laterality: N/A;  955  . ESOPHAGOGASTRODUODENOSCOPY N/A 06/19/2016   Procedure: ESOPHAGOGASTRODUODENOSCOPY (EGD);  Surgeon: Bernadene Person  Gloriann Loan, MD;  [facility];  Service: Endoscopy;  Laterality: N/A;  3:00  . EVALUATION UNDER ANESTHESIA WITH HEMORRHOIDECTOMY N/A 08/06/2018   Procedure: [place];  Surgeon: Erroll Luna, MD;  Location: [place];  Service: General;  Laterality: N/A;  . EXCISIONAL HEMORRHOIDECTOMY    . INCISIONAL HERNIA REPAIR  06/20/2015   WITH MYOFASCIAL ADVANCEMENT FLAP AND MESH   . INCISIONAL HERNIA REPAIR N/A 06/20/2015   Procedure: INCISIONAL HERNIA REPAIR WITH MYOFASCIAL  ADVANCEMENT FLAP AND MESH;  Surgeon: Erroll Luna, MD;  Location: [place];  Service: General;  Laterality: N/A;  . INGUINAL HERNIA REPAIR Left    "@ [facility]"  . INGUINAL HERNIA REPAIR Bilateral    "[place] [facility]"  . INSERTION OF MESH N/A 06/20/2015   Procedure: INSERTION OF MESH;  Surgeon: Erroll Luna, MD;  Location: [place];  Service: General;  Laterality: N/A;  . KNEE ARTHROSCOPY Bilateral 2001  . NASAL SEPTUM SURGERY  1980's  . TAKE DOWN OF INTESTINAL FISTULA N/A 02/02/2014   Procedure: TAKE DOWN OF ENTEROCUTANEOUS  FISTULA;  Surgeon: Marcello Moores A. Cornett, MD;  Location: [place];  Service: General;  Laterality: N/A;  . TONSILLECTOMY    . UPPER GI ENDOSCOPY       A IV Location/Drains/Wounds Patient Lines/Drains/Airways Status   Active Line/Drains/Airways    Name:   Placement date:   Placement time:   Site:   Days:   Peripheral IV 03/16/20 Right Antecubital   03/16/20    0724    Antecubital   less than 1   Peripheral IV 03/16/20 Right Forearm   03/16/20    1507    Forearm   less than 1   Incision (Closed) 06/20/15 Abdomen   06/20/15    1042     1731   Incision (Closed) 09/26/17 Neck Other (Comment)   09/26/17    1045     902   Incision (Closed) 08/06/18 Other (Comment)   08/06/18    0928     588          Intake/Output Last 24 hours  Intake/Output Summary (Last 24 hours) at 03/16/2020 1705 Last data filed at 03/16/2020 1700 Gross per 24 hour  Intake 600 ml  Output --  Net 600 ml    Labs/Imaging Results for orders placed or performed during the hospital encounter of 03/16/20 (from the past 48 hour(s))  Comprehensive metabolic panel     Status: Abnormal   Collection Time: 03/16/20  7:31 AM  Result Value Ref Range   Sodium 136 135 - 145 mmol/L   Potassium 2.5 (LL) 3.5 - 5.1 mmol/L    Comment: CRITICAL RESULT CALLED TO, READ BACK BY AND VERIFIED WITH: CARDWELL,L AT 8:10AM ON 03/16/20 BY FESTERMAN,C    Chloride 101 98 - 111 mmol/L   CO2 24 22 - 32 mmol/L   Glucose, Bld 107 (H) 70  - 99 mg/dL    Comment: Glucose reference range applies only to samples taken after fasting for at least 8 hours.   BUN 15 8 - 23 mg/dL   Creatinine, Ser 1.13 0.61 - 1.24 mg/dL   Calcium 8.6 (L) 8.9 - 10.3 mg/dL   Total Protein 7.2 6.5 - 8.1 g/dL   Albumin 3.6 3.5 - 5.0 g/dL   AST 26 15 - 41 U/L   ALT 21 0 - 44 U/L   Alkaline Phosphatase 60 38 - 126 U/L   Total Bilirubin 1.0 0.3 -  1.2 mg/dL   GFR calc non Af Amer >60 >60 mL/min   GFR calc Af Amer >60 >60 mL/min   Anion gap 11 5 - 15    Comment: Performed at [facility], [address] [place], [place] [address]  CBC with Differential     Status: None   Collection Time: 03/16/20  7:31 AM  Result Value Ref Range   WBC 4.3 4.0 - 10.5 K/uL   RBC 4.40 4.22 - 5.81 MIL/uL   Hemoglobin 13.7 13.0 - 17.0 g/dL   HCT 39.0 39.0 - 52.0 %   MCV 88.6 80.0 - 100.0 fL   MCH 31.1 26.0 - 34.0 pg   MCHC 35.1 30.0 - 36.0 g/dL   RDW 12.7 11.5 - 15.5 %   Platelets 178 150 - 400 K/uL   nRBC 0.0 0.0 - 0.2 %   Neutrophils Relative % 59 %   Neutro Abs 2.6 1.7 - 7.7 K/uL   Lymphocytes Relative 25 %   Lymphs Abs 1.1 0.7 - 4.0 K/uL   Monocytes Relative 15 %   Monocytes Absolute 0.7 0.1 - 1.0 K/uL   Eosinophils Relative 0 %   Eosinophils Absolute 0.0 0.0 - 0.5 K/uL   Basophils Relative 0 %   Basophils Absolute 0.0 0.0 - 0.1 K/uL   Immature Granulocytes 1 %   Abs Immature Granulocytes 0.02 0.00 - 0.07 K/uL    Comment: Performed at [facility], [address] [place], [place] [address]  Magnesium     Status: None   Collection Time: 03/16/20  7:31 AM  Result Value Ref Range   Magnesium 1.9 1.7 - 2.4 mg/dL    Comment: Performed at [facility], [address], [place], [place] [address]  Phosphorus     Status: Abnormal   Collection Time: 03/16/20  7:31 AM  Result Value Ref Range   Phosphorus 1.5 (L) 2.5 - 4.6 mg/dL    Comment: Performed at [facility], [address] [place], [place] [address]  TSH     Status: None   Collection Time: 03/16/20  7:31  AM  Result Value Ref Range   TSH 1.812 0.350 - 4.500 uIU/mL    Comment: Performed by a 3rd Generation assay with a functional sensitivity of <=0.01 uIU/mL. Performed at [facility], [address] [place], [place] [address]   SARS Coronavirus 2 by RT PCR (hospital order, performed in [facility] hospital lab) Nasopharyngeal Nasopharyngeal Swab     Status: Abnormal   Collection Time: 03/16/20  9:59 AM   Specimen: Nasopharyngeal Swab  Result Value Ref Range   SARS Coronavirus 2 POSITIVE (A) NEGATIVE    Comment: RESULT CALLED TO, READ BACK BY AND VERIFIED WITH: TALBOT T. AT 1107A ON [id number] BY THOMPSON S.  (NOTE) SARS-CoV-2 target nucleic acids are DETECTED SARS-CoV-2 RNA is generally detectable in upper respiratory specimens  during the acute phase of infection.  Positive results are indicative  of the presence of the identified virus, but do not rule out bacterial infection or co-infection with other pathogens not detected by the test.  Clinical correlation with patient history and  other diagnostic information is necessary to determine patient infection status.  The expected result is negative. Fact Sheet for Patients:   StrictlyIdeas.no  Fact Sheet for Healthcare Providers:   BankingDealers.co.za   This test is not yet approved or cleared by the [place] FDA and  has been authorized for detection and/or diagnosis of SARS-CoV-2 by FDA under an Emergency Use  Authorization (EUA).  This EUA will remain in effect (meaning this  test can be used) for the duration of  the COVID-19 declaration under Section 564(b)(1) of the Act, 21 U.S.C. section 360-bbb-3(b)(1), unless the authorization is terminated or revoked sooner. Performed at [facility], [address], [place], [place] [address]   Urinalysis, Routine w reflex microscopic     Status: Abnormal   Collection Time: 03/16/20 11:42 AM  Result Value Ref Range   Color, Urine YELLOW  YELLOW   APPearance CLEAR CLEAR   Specific Gravity, Urine 1.041 (H) 1.005 - 1.030   pH 6.0 5.0 - 8.0   Glucose, UA NEGATIVE NEGATIVE mg/dL   Hgb urine dipstick MODERATE (A) NEGATIVE   Bilirubin Urine NEGATIVE NEGATIVE   Ketones, ur NEGATIVE NEGATIVE mg/dL   Protein, ur NEGATIVE NEGATIVE mg/dL   Nitrite NEGATIVE NEGATIVE   Leukocytes,Ua NEGATIVE NEGATIVE   RBC / HPF 0-5 0 - 5 RBC/hpf   WBC, UA 0-5 0 - 5 WBC/hpf   Bacteria, UA NONE SEEN NONE SEEN   Squamous Epithelial / LPF 0-5 0 - 5   Mucus PRESENT     Comment: Performed at [facility], [address], [place], [place] [address]   CT Abdomen Pelvis W Contrast  Result Date: 03/16/2020 CLINICAL DATA:  Generalized weakness EXAM: CT ABDOMEN AND PELVIS WITH CONTRAST TECHNIQUE: Multidetector CT imaging of the abdomen and pelvis was performed using the standard protocol following bolus administration of intravenous contrast. CONTRAST:  158mL OMNIPAQUE IOHEXOL 300 MG/ML  SOLN COMPARISON:  01/19/2020 FINDINGS: Lower chest: Lung bases demonstrate patchy ground-glass opacities which were not seen on the prior exam. Stable subpleural nodule is noted in the left lower lobe. No sizable effusion is noted. Hepatobiliary: Fatty infiltration of the liver is noted. The gallbladder is partially distended. Pancreas: Unremarkable. No pancreatic ductal dilatation or surrounding inflammatory changes. Spleen: Normal in size without focal abnormality. Adrenals/Urinary Tract: Adrenal glands are unremarkable. Kidneys demonstrate a normal enhancement pattern bilaterally. No obstructive or inflammatory changes are seen. Few small nonobstructing stones are noted on the right. The left renal calculi seen previously are not as well appreciated on today's exam. The bladder is partially distended. Stomach/Bowel: The appendix has been surgically removed. Postsurgical changes are noted consistent with partial right colectomy. No obstructive changes are seen. Fluid is noted throughout  colon without obstructive change. Small bowel and stomach appear within normal limits. Vascular/Lymphatic: Aortic atherosclerosis. No enlarged abdominal or pelvic lymph nodes. Reproductive: Prostate is unremarkable. Other: No abdominal wall hernia or abnormality. No abdominopelvic ascites. Musculoskeletal: Degenerative changes of lumbar spine are noted. IMPRESSION: Patchy ground-glass opacities in the bases bilaterally not seen on the prior exam. This raises suspicion for atypical pneumonia. Correlation with COVID testing may be helpful. Nonobstructing right renal calculi. Postoperative changes. Stable subpleural nodule is noted on the left. This has been previously noted to be benign in etiology due to long-term stability. Electronically Signed   By: Inez Catalina M.D.   On: 03/16/2020 09:19    Pending Labs Unresulted Labs (From admission, onward)    Start     Ordered   03/16/20 2000  phosphorus level  (Serum Phosphorus)  Once-Timed,   STAT     03/16/20 1038          Vitals/Pain Today's Vitals   03/16/20 1500 03/16/20 1530 03/16/20 1600 03/16/20 1630  BP: (!) 108/56 109/65 126/83 103/66  Pulse: 72 76 77 72  Resp: (!) 21 15 14 19   Temp:  TempSrc:      SpO2: 96% 99% 96% 95%  Weight:      Height:      PainSc:        Isolation Precautions No active isolations  Medications Medications  potassium PHOSPHATE 45 mmol in dextrose 5 % 500 mL infusion (45 mmol Intravenous New Bag/Given 03/16/20 1517)  sodium chloride 0.9 % bolus 500 mL (0 mLs Intravenous Stopped 03/16/20 0855)  potassium chloride 10 mEq in 100 mL IVPB (0 mEq Intravenous Stopped 03/16/20 1700)  potassium chloride SA (KLOR-CON) CR tablet 40 mEq (40 mEq Oral Given 03/16/20 0830)  Phospha 250 Neutral [phone] MG TABS 500 mg (500 mg Oral Given 03/16/20 0938)  iohexol (OMNIPAQUE) 300 MG/ML solution 100 mL (100 mLs Intravenous Contrast Given 03/16/20 [id number])    Mobility walks Low fall risk   Focused  Assessments    R Recommendations: See Admitting Provider Note  Report given to:  Destiny RN 300  Additional Notes:

## 2020-03-16 NOTE — H&P (Signed)
History and Physical    Eduardo [name] [id number] DOB: 1946-04-01 DOA: 03/16/2020  PCP: Redmond School, MD  Patient coming from: Home  I have personally briefly reviewed patient's old medical records in [place]  Chief Complaint: Generalized weakness  HPI: Eduardo [name] is a 74 y.o. male with medical history significant of recently diagnosed prostate cancer, history of prior colon cancer status post right hemicolectomy, GERD, presents to the hospital with a 1 to 2-week history of poor p.o. intake, nausea, progressive generalized weakness.  He denies any fever, shortness of breath.  He has also had a cough for the past 7 to 10 days.  This has been nonproductive.  He has not had any abdominal pain.  ED Course: In the emergency room, he was noted to have low serum potassium, low phosphorus.  CT of the abdomen pelvis impression acute findings, but did indicate possible pneumonia in the lower lung bases bilaterally.  Covid test is positive.  Review of Systems: As per HPI otherwise 10 point review of systems negative.    Past Medical History:  Diagnosis Date  . Chronic pain syndrome   . Colon cancer ([place]) 10/20/13   colon resection-   . DDD (degenerative disc disease), cervical   . GERD (gastroesophageal reflux disease)   . History of kidney stones    seen on imaging but never knowingly passed spontaneously. Never had any manipulation surgery/procedure for it.   Marland Kitchen History of seasonal allergies   . Kidney stone   . Kidney stones   . Migraine    "maybe 1-2/month" (06/20/2015)  . Neuropathy 2010  . Primary osteoarthritis   . Skin cancer of face    "froze them off"  . Stroke [facility])    pt denies  . Wears glasses     Past Surgical History:  Procedure Laterality Date  . ANTERIOR CERVICAL DECOMP/DISCECTOMY FUSION N/A 09/26/2017   Procedure: ACDF - C3-C4 - C5-C6 - C6-C7;  Surgeon: Earnie Larsson, MD;  Location: [place];  Service: Neurosurgery;  Laterality: N/A;  . APPENDECTOMY   10/20/13   (with right hemicolectomy)  . CARPAL TUNNEL RELEASE Left 2011  . CARPAL TUNNEL RELEASE Right 01/07/2013   Procedure: CARPAL TUNNEL RELEASE;  Surgeon: Tennis Must, MD;  Location: [place];  Service: Orthopedics;  Laterality: Right;  . COLON RESECTION N/A 10/20/2013   Procedure:  LAPAROSCOPIC hand assisted partial colectomy;  Surgeon: Jamesetta So, MD;  Location: [facility];  Service: General;  Laterality: N/A;  . COLON SURGERY  2014   x2- 2nd. to correct a perforation , treated with TPN & drain & then later had to remove more colon   . COLONOSCOPY    . COLONOSCOPY N/A 10/18/2013   Procedure: COLONOSCOPY;  Surgeon: Danie Binder, MD;  [facility];  Service: Endoscopy;  Laterality: N/A;  2:00-moved to 1030 Pam to notify pt  . COLONOSCOPY N/A 09/22/2014   Procedure: COLONOSCOPY;  Surgeon: Rogene Houston, MD;  [facility];  Service: Endoscopy;  Laterality: N/A;  1200  . COLONOSCOPY N/A 11/20/2017   Procedure: COLONOSCOPY;  Surgeon: Rogene Houston, MD;  [facility];  Service: Endoscopy;  Laterality: N/A;  955  . ESOPHAGOGASTRODUODENOSCOPY N/A 06/19/2016   Procedure: ESOPHAGOGASTRODUODENOSCOPY (EGD);  Surgeon: Rogene Houston, MD;  [facility];  Service: Endoscopy;  Laterality: N/A;  3:00  . EVALUATION UNDER ANESTHESIA WITH HEMORRHOIDECTOMY N/A 08/06/2018   Procedure: EXAM UNDER ANESTHESIA WITHHEMORRHOIDECTOMY  ERAS PATHWAY;  Surgeon: Erroll Luna, MD;  Location: [place];  Service: General;  Laterality: N/A;  . EXCISIONAL HEMORRHOIDECTOMY    . INCISIONAL HERNIA REPAIR  06/20/2015   WITH MYOFASCIAL ADVANCEMENT FLAP AND MESH   . INCISIONAL HERNIA REPAIR N/A 06/20/2015   Procedure: INCISIONAL HERNIA REPAIR WITH MYOFASCIAL ADVANCEMENT FLAP AND MESH;  Surgeon: Erroll Luna, MD;  Location: [place];  Service: General;  Laterality: N/A;  . INGUINAL HERNIA REPAIR Left    "@ [facility]"  . INGUINAL HERNIA REPAIR  Bilateral    "[place]"  . INSERTION OF MESH N/A 06/20/2015   Procedure: INSERTION OF MESH;  Surgeon: Erroll Luna, MD;  Location: [place];  Service: General;  Laterality: N/A;  . KNEE ARTHROSCOPY Bilateral 2001  . NASAL SEPTUM SURGERY  1980's  . TAKE DOWN OF INTESTINAL FISTULA N/A 02/02/2014   Procedure: TAKE DOWN OF ENTEROCUTANEOUS  FISTULA;  Surgeon: Marcello Moores A. Cornett, MD;  Location: [place];  Service: General;  Laterality: N/A;  . TONSILLECTOMY    . UPPER GI ENDOSCOPY      Social History:  reports that he has never smoked. He has never used smokeless tobacco. He reports that he does not drink alcohol or use drugs.  Allergies  Allergen Reactions  . Niaspan [Niacin Er] Rash  . Tape Rash and Other (See Comments)    Paper tape OK    Family History  Problem Relation Age of Onset  . Colon polyps Father   . Colon polyps Paternal Uncle   . Colon polyps Maternal Grandfather     Prior to Admission medications   Medication Sig Start Date End Date Taking? Authorizing Provider  clotrimazole-betamethasone (LOTRISONE) cream APPLY EXTERNALLY TO THE AFFECTED AND SURROUNDING AREAS TWICE DAILY IN THE MORNING AND IN THE EVENING 12/22/19  Yes [provider]  cyanocobalamin (,VITAMIN B-12,) 1000 MCG/ML injection Inject 1,000 mcg into the muscle every 30 (thirty) days.   Yes [provider]  EPINEPHrine 0.3 mg/0.3 mL IJ SOAJ injection Inject 0.3 mLs (0.3 mg total) into the muscle once. Patient taking differently: Inject 0.3 mg into the muscle once.  07/16/15  Yes Rancour, Annie Main, MD  fluticasone (FLONASE) 50 MCG/ACT nasal spray Place 2 sprays into both nostrils 2 (two) times daily as needed for allergies.  09/28/13  Yes [provider]  HYDROcodone-acetaminophen (NORCO/VICODIN) 5-325 MG tablet Take 1 tablet by mouth every 6 (six) hours as needed for moderate pain.   Yes [provider]  levothyroxine (SYNTHROID) 50 MCG tablet Take 50 mcg by mouth daily. 01/13/20  Yes  [provider]  ranitidine (ZANTAC) 300 MG tablet Take 300 mg by mouth daily as needed for heartburn.   Yes [provider]  rizatriptan (MAXALT) 10 MG tablet Take 10 mg by mouth as needed for migraine. May repeat in 2 hours if needed   Yes [provider]  aspirin EC 81 MG tablet Take 1 tablet (81 mg total) by mouth daily. Patient not taking: Reported on 03/16/2020 04/21/19 04/20/20  Garvin Fila, MD  clarithromycin (BIAXIN) 500 MG tablet Take 500 mg by mouth 2 (two) times daily. 01/17/20   [provider]  Omega-3 Fatty Acids (FISH OIL PO) Take 1,000 mg by mouth.    [provider]  topiramate (TOPAMAX) 50 MG tablet Take 1 tablet (50 mg total) by mouth 2 (two) times daily. Start 1 tablet daily x 1 week and then twice daily Patient not taking: Reported on 03/16/2020 04/21/19  04/20/20  Garvin Fila, MD    Physical Exam: Vitals:   03/16/20 1600 03/16/20 1630 03/16/20 1800 03/16/20 1929  BP: 126/83 103/66 136/84   Pulse: 77 72    Resp: 14 19 18    Temp:   98.4 F (36.9 C)   TempSrc:   Oral   SpO2: 96% 95% 95% 98%  Weight:      Height:        Constitutional: NAD, calm, comfortable Eyes: PERRL, lids and conjunctivae normal ENMT: Mucous membranes are moist. Posterior pharynx clear of any exudate or lesions.Normal dentition.  Neck: normal, supple, no masses, no thyromegaly Respiratory: clear to auscultation bilaterally, no wheezing, no crackles. Normal respiratory effort. No accessory muscle use.  Cardiovascular: Regular rate and rhythm, no murmurs / rubs / gallops. No extremity edema. 2+ pedal pulses. No carotid bruits.  Abdomen: no tenderness, no masses palpated. No hepatosplenomegaly. Bowel sounds positive.  Musculoskeletal: no clubbing / cyanosis. No joint deformity upper and lower extremities. Good ROM, no contractures. Normal muscle tone.  Skin: no rashes, lesions, ulcers. No induration Neurologic: CN 2-12 grossly intact. Sensation intact, DTR  normal. Strength 5/5 in all 4.  Psychiatric: Normal judgment and insight. Alert and oriented x 3. Normal mood.    Labs on Admission: I have personally reviewed following labs and imaging studies  CBC: Recent Labs  Lab 03/16/20 0731  WBC 4.3  NEUTROABS 2.6  HGB 13.7  HCT 39.0  MCV 88.6  PLT 0000000   Basic Metabolic Panel: Recent Labs  Lab 03/16/20 0731  NA 136  K 2.5*  CL 101  CO2 24  GLUCOSE 107*  BUN 15  CREATININE 1.13  CALCIUM 8.6*  MG 1.9  PHOS 1.5*   GFR: Estimated Creatinine Clearance: 68.7 mL/min (by C-G formula based on SCr of 1.13 mg/dL). Liver Function Tests: Recent Labs  Lab 03/16/20 0731  AST 26  ALT 21  ALKPHOS 60  BILITOT 1.0  PROT 7.2  ALBUMIN 3.6   No results for input(s): LIPASE, AMYLASE in the last 168 hours. No results for input(s): AMMONIA in the last 168 hours. Coagulation Profile: No results for input(s): INR, PROTIME in the last 168 hours. Cardiac Enzymes: No results for input(s): CKTOTAL, CKMB, CKMBINDEX, TROPONINI in the last 168 hours. BNP (last 3 results) No results for input(s): PROBNP in the last 8760 hours. HbA1C: No results for input(s): HGBA1C in the last 72 hours. CBG: No results for input(s): GLUCAP in the last 168 hours. Lipid Profile: No results for input(s): CHOL, HDL, LDLCALC, TRIG, CHOLHDL, LDLDIRECT in the last 72 hours. Thyroid Function Tests: Recent Labs    03/16/20 0731  TSH 1.812   Anemia Panel: Recent Labs    03/16/20 1808  FERRITIN 249   Urine analysis:    Component Value Date/Time   COLORURINE YELLOW 03/16/2020 1142   APPEARANCEUR CLEAR 03/16/2020 1142   LABSPEC 1.041 (H) 03/16/2020 1142   PHURINE 6.0 03/16/2020 1142   GLUCOSEU NEGATIVE 03/16/2020 1142   HGBUR MODERATE (A) 03/16/2020 1142   BILIRUBINUR NEGATIVE 03/16/2020 1142   BILIRUBINUR neg 01/25/2020 1439   KETONESUR NEGATIVE 03/16/2020 1142   PROTEINUR NEGATIVE 03/16/2020 1142   UROBILINOGEN 0.2 01/25/2020 1439   UROBILINOGEN 0.2  11/28/2014 1919   NITRITE NEGATIVE 03/16/2020 1142   LEUKOCYTESUR NEGATIVE 03/16/2020 1142    Radiological Exams on Admission: CT Abdomen Pelvis W Contrast  Result Date: 03/16/2020 CLINICAL DATA:  Generalized weakness EXAM: CT ABDOMEN AND PELVIS WITH CONTRAST TECHNIQUE: Multidetector CT imaging of  the abdomen and pelvis was performed using the standard protocol following bolus administration of intravenous contrast. CONTRAST:  112mL OMNIPAQUE IOHEXOL 300 MG/ML  SOLN COMPARISON:  01/19/2020 FINDINGS: Lower chest: Lung bases demonstrate patchy ground-glass opacities which were not seen on the prior exam. Stable subpleural nodule is noted in the left lower lobe. No sizable effusion is noted. Hepatobiliary: Fatty infiltration of the liver is noted. The gallbladder is partially distended. Pancreas: Unremarkable. No pancreatic ductal dilatation or surrounding inflammatory changes. Spleen: Normal in size without focal abnormality. Adrenals/Urinary Tract: Adrenal glands are unremarkable. Kidneys demonstrate a normal enhancement pattern bilaterally. No obstructive or inflammatory changes are seen. Few small nonobstructing stones are noted on the right. The left renal calculi seen previously are not as well appreciated on today's exam. The bladder is partially distended. Stomach/Bowel: The appendix has been surgically removed. Postsurgical changes are noted consistent with partial right colectomy. No obstructive changes are seen. Fluid is noted throughout colon without obstructive change. Small bowel and stomach appear within normal limits. Vascular/Lymphatic: Aortic atherosclerosis. No enlarged abdominal or pelvic lymph nodes. Reproductive: Prostate is unremarkable. Other: No abdominal wall hernia or abnormality. No abdominopelvic ascites. Musculoskeletal: Degenerative changes of lumbar spine are noted. IMPRESSION: Patchy ground-glass opacities in the bases bilaterally not seen on the prior exam. This raises  suspicion for atypical pneumonia. Correlation with COVID testing may be helpful. Nonobstructing right renal calculi. Postoperative changes. Stable subpleural nodule is noted on the left. This has been previously noted to be benign in etiology due to long-term stability. Electronically Signed   By: Inez Catalina M.D.   On: 03/16/2020 09:19    EKG: Independently reviewed.  Sinus rhythm without acute changes.  Assessment/Plan Active Problems:   Prostate cancer ([place])   Hypokalemia   Pneumonia due to COVID-19 virus   Hypophosphatemia   Generalized weakness   Hypothyroidism     1. Hypokalemia/hypophosphatemia.  Likely related to decreased p.o. intake in the setting of increased stools.  We will continue with IV/p.o. replacement. 2. Chronic diarrhea.  Present since his right hemicolectomy.  No change in chronic condition. 3. Pneumonia due to COVID-19.  He does not have any hypoxia or shortness of breath at this time.  Will hold off on steroids.  Check inflammatory markers.  Patient has not been vaccinated for COVID-19.  Will start on remdesivir. 4. Hypothyroidism.  Continue Synthroid 5. Generalized weakness.  Likely related to electrolyte abnormalities. 6. Failure to thrive/decreased p.o. intake.  Suspect this may have been precipitated by Covid.  Continue antiemetics.  DVT prophylaxis: Lovenox Code Status: Full code Family Communication: Discussed with wife over the phone Disposition Plan: Discharge home once electrolytes are corrected Consults called:   Admission status: Observation, telemetry  Kathie Dike MD [facility]   If 7PM-7AM, please contact night-coverage [email or web address]   03/16/2020, 8:00 PM

## 2020-03-16 NOTE — ED Triage Notes (Signed)
Pt reports dizziness and generalized weakness for the past few days.  Reports had H pylori approx 6 weeks ago and has been on antibiotics for approx 3 weeks.  Then recently diagnosed with prostate cancer and was put on antibiotics prior to his biopsy.  Pt says started getting sick around the time of his biopsy.

## 2020-03-16 NOTE — ED Notes (Signed)
Pt is aware we need urine sample, urinal at bedside.  

## 2020-03-16 NOTE — ED Provider Notes (Signed)
[facility] EMERGENCY DEPARTMENT Provider Note   CSN: [id number] Arrival date & time: 03/16/20  [id number]     History Chief Complaint  Patient presents with  . Dizziness    Eduardo [name] is a 74 y.o. male.  HPI    Patient presents for what he says is new increase in weakness in the last 24 hours.  He has been feeling generally weak since his prostate biopsy 5/18 which is since shown multiple samples positive for malignant prostate cancer.  And says that he has not really eaten since then.  He thinks that he has lost weight and chart review shows a 4-1/2 kg weight loss in the last 6 weeks.  He said he said a recent treatment course as well for H pylori but says that he has had no abdominal pain or nausea/vomiting.  He did state he started getting some vertigo symptoms today.  Says he is noticed no hematuria or swelling around the prostate sample site.  Of note he says he has a 2-year history of a hemorrhoid surgery which he said left him with a smaller rectum and he does not want a manual exam today.  Says that he has a history of colon cancer with a hemicolectomy which did have initial perforation but has since been repaired and has been operating without issue for him other than he has increased loose stools and "everything runs through me ".  That was 2 years ago and he has maintained weight reasonably well until recently.  I did ask him about potential of depression or his emotional reaction to this new news he said that he does not think that he needs therapy but just views this is something he has to get through.  He feels that he is generally getting tired of all of this and sometimes feels like giving up.  Says he does not have any thoughts of hurting himself.  Denies any falls or loss of consciousness, denies any known sick contacts says he takes all his medication as prescribed  Pm Hx: recent prostate cancer diagnosis, hemicolectomy 2/2 colon cancer  Past Medical History:  Diagnosis Date    . Chronic pain syndrome   . Colon cancer ([place]) 10/20/13   colon resection-   . DDD (degenerative disc disease), cervical   . GERD (gastroesophageal reflux disease)   . History of kidney stones    seen on imaging but never knowingly passed spontaneously. Never had any manipulation surgery/procedure for it.   Marland Kitchen History of seasonal allergies   . Kidney stone   . Kidney stones   . Migraine    "maybe 1-2/month" (06/20/2015)  . Neuropathy 2010  . Primary osteoarthritis   . Skin cancer of face    "froze them off"  . Stroke [facility])    pt denies  . Wears glasses     Patient Active Problem List   Diagnosis Date Noted  . Prostate cancer ([place]) 03/16/2020  . Elevated PSA 03/07/2020  . Cervical spinal stenosis 09/26/2017  . Malignant neoplasm of hepatic flexure ([place]) 08/21/2017  . Right ureteral stone 03/21/2017  . Incisional hernia, without obstruction or gangrene 06/20/2015  . Urinary retention 02/07/2014  . Enterocutaneous fistula 12/10/2013  . Protein-calorie malnutrition, severe ([place]) 11/04/2013  . Abscess of abdominal cavity ([place]) 10/27/2013  . Colon cancer ([place]) 10/20/2013  . Constipation - functional 10/07/2013  . Colon cancer screening 10/07/2013  . GERD (gastroesophageal reflux disease) 10/07/2013    Past Surgical History:  Procedure Laterality  Date  . ANTERIOR CERVICAL DECOMP/DISCECTOMY FUSION N/A 09/26/2017   Procedure: ACDF - C3-C4 - C5-C6 - C6-C7;  Surgeon: Earnie Larsson, MD;  Location: [place];  Service: Neurosurgery;  Laterality: N/A;  . APPENDECTOMY  10/20/13   (with right hemicolectomy)  . CARPAL TUNNEL RELEASE Left 2011  . CARPAL TUNNEL RELEASE Right 01/07/2013   Procedure: CARPAL TUNNEL RELEASE;  Surgeon: Tennis Must, MD;  Location: [place];  Service: Orthopedics;  Laterality: Right;  . COLON RESECTION N/A 10/20/2013   Procedure:  LAPAROSCOPIC hand assisted partial colectomy;  Surgeon: Jamesetta So, MD;  Location: [facility];  Service: General;   Laterality: N/A;  . COLON SURGERY  2014   x2- 2nd. to correct a perforation , treated with TPN & drain & then later had to remove more colon   . COLONOSCOPY    . COLONOSCOPY N/A 10/18/2013   Procedure: COLONOSCOPY;  Surgeon: Danie Binder, MD;  [facility];  Service: Endoscopy;  Laterality: N/A;  2:00-moved to 1030 Pam to notify pt  . COLONOSCOPY N/A 09/22/2014   Procedure: COLONOSCOPY;  Surgeon: Rogene Houston, MD;  [facility];  Service: Endoscopy;  Laterality: N/A;  1200  . COLONOSCOPY N/A 11/20/2017   Procedure: COLONOSCOPY;  Surgeon: Rogene Houston, MD;  [facility];  Service: Endoscopy;  Laterality: N/A;  955  . ESOPHAGOGASTRODUODENOSCOPY N/A 06/19/2016   Procedure: ESOPHAGOGASTRODUODENOSCOPY (EGD);  Surgeon: Rogene Houston, MD;  [facility];  Service: Endoscopy;  Laterality: N/A;  3:00  . EVALUATION UNDER ANESTHESIA WITH HEMORRHOIDECTOMY N/A 08/06/2018   Procedure: [place];  Surgeon: Erroll Luna, MD;  Location: [place];  Service: General;  Laterality: N/A;  . EXCISIONAL HEMORRHOIDECTOMY    . INCISIONAL HERNIA REPAIR  06/20/2015   WITH MYOFASCIAL ADVANCEMENT FLAP AND MESH   . INCISIONAL HERNIA REPAIR N/A 06/20/2015   Procedure: INCISIONAL HERNIA REPAIR WITH MYOFASCIAL ADVANCEMENT FLAP AND MESH;  Surgeon: Erroll Luna, MD;  Location: [place];  Service: General;  Laterality: N/A;  . INGUINAL HERNIA REPAIR Left    "@ [facility]"  . INGUINAL HERNIA REPAIR Bilateral    "[place] [facility]"  . INSERTION OF MESH N/A 06/20/2015   Procedure: INSERTION OF MESH;  Surgeon: Erroll Luna, MD;  Location: [place];  Service: General;  Laterality: N/A;  . KNEE ARTHROSCOPY Bilateral 2001  . NASAL SEPTUM SURGERY  1980's  . TAKE DOWN OF INTESTINAL FISTULA N/A 02/02/2014   Procedure: TAKE DOWN OF ENTEROCUTANEOUS  FISTULA;  Surgeon: Marcello Moores A. Cornett, MD;  Location: [place];  Service: General;   Laterality: N/A;  . TONSILLECTOMY    . UPPER GI ENDOSCOPY         Family History  Problem Relation Age of Onset  . Colon polyps Father   . Colon polyps Paternal Uncle   . Colon polyps Maternal Grandfather     Social History   Tobacco Use  . Smoking status: Never Smoker  . Smokeless tobacco: Never Used  Substance Use Topics  . Alcohol use: No  . Drug use: No    Home Medications Prior to Admission medications   Medication Sig Start Date End Date Taking? Authorizing Provider  clotrimazole-betamethasone (LOTRISONE) cream APPLY EXTERNALLY TO THE AFFECTED AND SURROUNDING AREAS TWICE DAILY IN THE MORNING AND IN THE EVENING 12/22/19  Yes [provider]  cyanocobalamin (,VITAMIN B-12,) 1000 MCG/ML injection Inject 1,000 mcg into the muscle every 30 (  thirty) days.   Yes [provider]  EPINEPHrine 0.3 mg/0.3 mL IJ SOAJ injection Inject 0.3 mLs (0.3 mg total) into the muscle once. Patient taking differently: Inject 0.3 mg into the muscle once.  07/16/15  Yes Rancour, Annie Main, MD  fluticasone (FLONASE) 50 MCG/ACT nasal spray Place 2 sprays into both nostrils 2 (two) times daily as needed for allergies.  09/28/13  Yes [provider]  HYDROcodone-acetaminophen (NORCO/VICODIN) 5-325 MG tablet Take 1 tablet by mouth every 6 (six) hours as needed for moderate pain.   Yes [provider]  levothyroxine (SYNTHROID) 50 MCG tablet Take 50 mcg by mouth daily. 01/13/20  Yes [provider]  ranitidine (ZANTAC) 300 MG tablet Take 300 mg by mouth daily as needed for heartburn.   Yes [provider]  rizatriptan (MAXALT) 10 MG tablet Take 10 mg by mouth as needed for migraine. May repeat in 2 hours if needed   Yes [provider]  aspirin EC 81 MG tablet Take 1 tablet (81 mg total) by mouth daily. Patient not taking: Reported on 03/16/2020 04/21/19 04/20/20  Garvin Fila, MD  clarithromycin (BIAXIN) 500 MG tablet Take 500 mg by mouth 2 (two)  times daily. 01/17/20   [provider]  Omega-3 Fatty Acids (FISH OIL PO) Take 1,000 mg by mouth.    [provider]  topiramate (TOPAMAX) 50 MG tablet Take 1 tablet (50 mg total) by mouth 2 (two) times daily. Start 1 tablet daily x 1 week and then twice daily Patient not taking: Reported on 03/16/2020 04/21/19 04/20/20  Garvin Fila, MD    Allergies    Niaspan [niacin er] and Tape  Review of Systems   Review of Systems  Constitutional: Positive for activity change, appetite change, fatigue and unexpected weight change. Negative for chills, diaphoresis and fever.  HENT: Negative.   Eyes: Negative.   Respiratory: Negative.   Cardiovascular: Negative.   Gastrointestinal: Positive for diarrhea. Negative for abdominal distention, abdominal pain, anal bleeding, blood in stool, constipation, nausea, rectal pain and vomiting.  Genitourinary: Negative for decreased urine volume, difficulty urinating, discharge, dysuria, enuresis, flank pain, frequency, genital sores, hematuria, penile pain, penile swelling, scrotal swelling, testicular pain and urgency.  Musculoskeletal: Negative.   Skin: Negative.   Neurological: Positive for dizziness, weakness and light-headedness. Negative for tremors, seizures, syncope, facial asymmetry, speech difficulty, numbness and headaches.  Psychiatric/Behavioral: Positive for dysphoric mood. Negative for agitation, behavioral problems, confusion, decreased concentration, hallucinations, self-injury, sleep disturbance and suicidal ideas. The patient is not nervous/anxious and is not hyperactive.     Physical Exam Updated Vital Signs BP 112/75   Pulse 75   Temp 98.9 F (37.2 C) (Oral)   Resp 20   Ht 6\' 2"  (1.88 m)   Wt 93 kg   SpO2 95%   BMI 26.32 kg/m   Physical Exam Vitals and nursing note reviewed.  Constitutional:      General: He is not in acute distress.    Appearance: He is ill-appearing.  HENT:     Head: Normocephalic.     Nose:  Nose normal.     Mouth/Throat:     Mouth: Mucous membranes are moist.  Eyes:     General: No scleral icterus. Cardiovascular:     Rate and Rhythm: Normal rate and regular rhythm.     Pulses: Normal pulses.     Heart sounds: Normal heart sounds.  Pulmonary:     Effort: Pulmonary effort is normal.  Breath sounds: Normal breath sounds.  Abdominal:     General: Abdomen is flat. There is no distension.     Palpations: Abdomen is soft. There is no mass.     Tenderness: There is no abdominal tenderness. There is no guarding.  Skin:    General: Skin is warm and dry.  Neurological:     General: No focal deficit present.     Mental Status: He is alert and oriented to person, place, and time.  Psychiatric:        Behavior: Behavior normal.        Thought Content: Thought content normal.        Judgment: Judgment normal.     Comments: Flat/depressed affect     ED Results / Procedures / Treatments   Labs (all labs ordered are listed, but only abnormal results are displayed) Labs Reviewed  COMPREHENSIVE METABOLIC PANEL - Abnormal; Notable for the following components:      Result Value   Potassium 2.5 (*)    Glucose, Bld 107 (*)    Calcium 8.6 (*)    All other components within normal limits  PHOSPHORUS - Abnormal; Notable for the following components:   Phosphorus 1.5 (*)    All other components within normal limits  SARS CORONAVIRUS 2 BY RT PCR (HOSPITAL ORDER, [place])  CBC WITH DIFFERENTIAL/PLATELET  MAGNESIUM  TSH  URINALYSIS, ROUTINE W REFLEX MICROSCOPIC    EKG EKG Interpretation  Date/Time:  Thursday Mar 16 2020 07:11:24 EDT Ventricular Rate:  81 PR Interval:    QRS Duration: 99 QT Interval:  370 QTC Calculation: 430 R Axis:   -83 Text Interpretation: Sinus rhythm Left anterior fascicular block Confirmed by Elnora Morrison [phone]) on 03/16/2020 7:29:57 AM   Radiology CT Abdomen Pelvis W Contrast  Result Date: 03/16/2020 CLINICAL  DATA:  Generalized weakness EXAM: CT ABDOMEN AND PELVIS WITH CONTRAST TECHNIQUE: Multidetector CT imaging of the abdomen and pelvis was performed using the standard protocol following bolus administration of intravenous contrast. CONTRAST:  163mL OMNIPAQUE IOHEXOL 300 MG/ML  SOLN COMPARISON:  01/19/2020 FINDINGS: Lower chest: Lung bases demonstrate patchy ground-glass opacities which were not seen on the prior exam. Stable subpleural nodule is noted in the left lower lobe. No sizable effusion is noted. Hepatobiliary: Fatty infiltration of the liver is noted. The gallbladder is partially distended. Pancreas: Unremarkable. No pancreatic ductal dilatation or surrounding inflammatory changes. Spleen: Normal in size without focal abnormality. Adrenals/Urinary Tract: Adrenal glands are unremarkable. Kidneys demonstrate a normal enhancement pattern bilaterally. No obstructive or inflammatory changes are seen. Few small nonobstructing stones are noted on the right. The left renal calculi seen previously are not as well appreciated on today's exam. The bladder is partially distended. Stomach/Bowel: The appendix has been surgically removed. Postsurgical changes are noted consistent with partial right colectomy. No obstructive changes are seen. Fluid is noted throughout colon without obstructive change. Small bowel and stomach appear within normal limits. Vascular/Lymphatic: Aortic atherosclerosis. No enlarged abdominal or pelvic lymph nodes. Reproductive: Prostate is unremarkable. Other: No abdominal wall hernia or abnormality. No abdominopelvic ascites. Musculoskeletal: Degenerative changes of lumbar spine are noted. IMPRESSION: Patchy ground-glass opacities in the bases bilaterally not seen on the prior exam. This raises suspicion for atypical pneumonia. Correlation with COVID testing may be helpful. Nonobstructing right renal calculi. Postoperative changes. Stable subpleural nodule is noted on the left. This has been  previously noted to be benign in etiology due to long-term stability. Electronically Signed  By: Inez Catalina M.D.   On: 03/16/2020 09:19    Procedures Procedures (including critical care time)  Medications Ordered in ED Medications  potassium chloride 10 mEq in 100 mL IVPB (10 mEq Intravenous New Bag/Given 03/16/20 0938)  sodium chloride 0.9 % bolus 500 mL (0 mLs Intravenous Stopped 03/16/20 0855)  potassium chloride SA (KLOR-CON) CR tablet 40 mEq (40 mEq Oral Given 03/16/20 0830)  Phospha 250 Neutral [id number] MG TABS 500 mg (500 mg Oral Given 03/16/20 0938)  iohexol (OMNIPAQUE) 300 MG/ML solution 100 mL (100 mLs Intravenous Contrast Given 03/16/20 [id number])    ED Course  I have reviewed the triage vital signs and the nursing notes.  Pertinent labs & imaging results that were available during my care of the patient were reviewed by me and considered in my medical decision making (see chart for details).    MDM Rules/Calculators/A&P                      Patient vital signs of been stable, most likely due to lack of eating for the last 2 weeks there have been multiple metabolite abnormalities noted we are working on replenishing these now.  CT abdomen pelvis did not indicate any obvious abnormality related to the prostate biopsy but did indicate some lung changes consistent with potential Covid infection although patient has not reported any Covid symptoms.  He has not been vaccinated and will be tested as an asymptomatic tier 2.  At this point there is been no structural cause found for the patient's decreased appetite and 4-1/2 kg weight loss in the last 6 to 8 weeks and I would think that depression related to cancer diagnosis could potentially be can significant contribution.  Case discussed with hospitalist to review for admission     Final Clinical Impression(s) / ED Diagnoses Final diagnoses:  Hypokalemia    Rx / DC Orders ED Discharge Orders    None       Sherene Sires,  DO 03/16/20 1036    Elnora Morrison, MD 03/16/20 1546

## 2020-03-17 DIAGNOSIS — E860 Dehydration: Secondary | ICD-10-CM

## 2020-03-17 DIAGNOSIS — E876 Hypokalemia: Secondary | ICD-10-CM | POA: Diagnosis not present

## 2020-03-17 DIAGNOSIS — C61 Malignant neoplasm of prostate: Secondary | ICD-10-CM | POA: Diagnosis not present

## 2020-03-17 DIAGNOSIS — R531 Weakness: Secondary | ICD-10-CM | POA: Diagnosis not present

## 2020-03-17 LAB — CBC WITH DIFFERENTIAL/PLATELET
Abs Immature Granulocytes: 0.01 10*3/uL (ref 0.00–0.07)
Basophils Absolute: 0 10*3/uL (ref 0.0–0.1)
Basophils Relative: 0 %
Eosinophils Absolute: 0 10*3/uL (ref 0.0–0.5)
Eosinophils Relative: 0 %
HCT: 38.5 % — ABNORMAL LOW (ref 39.0–52.0)
Hemoglobin: 12.8 g/dL — ABNORMAL LOW (ref 13.0–17.0)
Immature Granulocytes: 0 %
Lymphocytes Relative: 31 %
Lymphs Abs: 1.2 10*3/uL (ref 0.7–4.0)
MCH: 29.8 pg (ref 26.0–34.0)
MCHC: 33.2 g/dL (ref 30.0–36.0)
MCV: 89.7 fL (ref 80.0–100.0)
Monocytes Absolute: 0.6 10*3/uL (ref 0.1–1.0)
Monocytes Relative: 16 %
Neutro Abs: 2 10*3/uL (ref 1.7–7.7)
Neutrophils Relative %: 53 %
Platelets: 180 10*3/uL (ref 150–400)
RBC: 4.29 MIL/uL (ref 4.22–5.81)
RDW: 12.7 % (ref 11.5–15.5)
WBC: 3.8 10*3/uL — ABNORMAL LOW (ref 4.0–10.5)
nRBC: 0 % (ref 0.0–0.2)

## 2020-03-17 LAB — PHOSPHORUS: Phosphorus: 3 mg/dL (ref 2.5–4.6)

## 2020-03-17 LAB — COMPREHENSIVE METABOLIC PANEL
ALT: 21 U/L (ref 0–44)
AST: 24 U/L (ref 15–41)
Albumin: 3.3 g/dL — ABNORMAL LOW (ref 3.5–5.0)
Alkaline Phosphatase: 57 U/L (ref 38–126)
Anion gap: 11 (ref 5–15)
BUN: 11 mg/dL (ref 8–23)
CO2: 23 mmol/L (ref 22–32)
Calcium: 8.5 mg/dL — ABNORMAL LOW (ref 8.9–10.3)
Chloride: 107 mmol/L (ref 98–111)
Creatinine, Ser: 1.01 mg/dL (ref 0.61–1.24)
GFR calc Af Amer: >60 mL/min (ref >60)
GFR calc non Af Amer: >60 mL/min (ref >60)
Glucose, Bld: 98 mg/dL (ref 70–99)
Potassium: 3.5 mmol/L (ref 3.5–5.1)
Sodium: 141 mmol/L (ref 135–145)
Total Bilirubin: 0.8 mg/dL (ref 0.3–1.2)
Total Protein: 6.8 g/dL (ref 6.5–8.1)

## 2020-03-17 LAB — FERRITIN: Ferritin: 236 ng/mL (ref 24–336)

## 2020-03-17 LAB — D-DIMER, QUANTITATIVE: D-Dimer, Quant: 0.44 ug/mL-FEU (ref 0.00–0.50)

## 2020-03-17 LAB — C-REACTIVE PROTEIN: CRP: 1.6 mg/dL — ABNORMAL HIGH (ref <1.0)

## 2020-03-17 MED ORDER — ONDANSETRON 8 MG PO TBDP
8.0000 mg | ORAL_TABLET | Freq: Three times a day (TID) | ORAL | 0 refills | Status: DC | PRN
Start: 2020-03-17 — End: 2022-06-17

## 2020-03-17 MED ORDER — ACETAMINOPHEN 325 MG PO TABS: 650.0000 mg | ORAL_TABLET | Freq: Four times a day (QID) | ORAL | 0 refills | Status: DC | PRN

## 2020-03-17 MED ORDER — DEXAMETHASONE 6 MG PO TABS: 6.0000 mg | ORAL_TABLET | Freq: Every day | ORAL | 0 refills | Status: AC

## 2020-03-17 MED ORDER — PANTOPRAZOLE SODIUM 40 MG PO TBEC
40.0000 mg | DELAYED_RELEASE_TABLET | Freq: Every day | ORAL | 0 refills | Status: DC
Start: 2020-03-17 — End: 2022-06-17

## 2020-03-17 NOTE — Discharge Summary (Signed)
Physician Discharge Summary  Eduardo [name] [id number] DOB: [birth year] DOA: 03/16/2020  PCP: Redmond School, MD  Admit date: 03/16/2020 Discharge date: 03/17/2020  Time spent: 35 minutes  Recommendations for Outpatient Follow-up:  1. Repeat basic metabolic panel to follow electrolytes and renal function 2. Recheck phosphorus and magnesium levels at follow-up visit. 3. Reassess complete resolution of patient's symptoms. 4. -30 to 45 days after resolution of acute COVID symptoms assist patient with vaccination.   Discharge Diagnoses:  Active Problems:   Prostate cancer ([place])   Hypokalemia   COVID-19 virus infection   Hypophosphatemia   Generalized weakness   Hypothyroidism   Dehydration   Discharge Condition: Stable and improved.  Discharged home with instruction to follow-up with PCP in 14 days.  CODE STATUS: Full code.  Diet recommendation: Heart healthy diet.  Filed Weights   03/16/20 0708  Weight: 93 kg    History of present illness:  As per H&P written by Dr. Roderic Palau on 03/16/2020 74 y.o. male with medical history significant of recently diagnosed prostate cancer, history of prior colon cancer status post right hemicolectomy, GERD, presents to the hospital with a 1 to 2-week history of poor p.o. intake, nausea, progressive generalized weakness.  He denies any fever, shortness of breath.  He has also had a cough for the past 7 to 10 days.  This has been nonproductive.  He has not had any abdominal pain.  ED Course: In the emergency room, he was noted to have low serum potassium, low phosphorus.  CT of the abdomen pelvis impression acute findings, but did indicate possible pneumonia in the lower lung bases bilaterally.  Covid test is positive.  Hospital Course:  1-dehydration/hypokalemia/hypophosphatemia -In the setting of GI losses and poor oral intake -Improved after IV fluids and electrolytes repletion provided -Patient tolerating oral intake and looking to go home  continue continue adequate nutrition and hydration -As needed antiemetics has been prescribed -Continue PPI -Continue supportive care and treatment with steroids for underlying Covid infection.  2-diarrhea -Patient reports some chronic diarrhea/loose stools since his right hemicolectomy. -Continue supportive management and as needed medications to slow down GI transit.  3-hypothyroidism -Continue Synthroid.  4-generalized weakness: Most likely in the setting of acute dehydration and electrolyte abnormalities. -Improvement.  Back to baseline after fluid resuscitation and repletion -Patient advised to maintain adequate hydration.  5-history of prostate cancer -Continue outpatient follow-up with oncology/urology service.  6-COVID-19 infection--patient without hypoxia, shortness of breath, cough, or any other significant respiratory component. -Some atypical infiltrates appreciated on x-ray -Given overall risk factors will treat with 10 days of Decadron at discharge -2 days of remdesivir given while inpatient. -Tylenol recommended for symptomatic management.  7-GERD -Continue PPI  Procedures: See below for x-ray reports  Consultations:  None  Discharge Exam: Vitals:   03/17/20 0011 03/17/20 0451  BP: 121/82 120/83  Pulse: 74 72  Resp: 16 16  Temp: 98.7 F (37.1 C) 98.1 F (36.7 C)  SpO2: 97% 96%    General: Afebrile, no chest pain, no shortness of breath, no coughing, reports feeling significantly better and ready to go home.  Tolerating diet without problem, no having nausea, vomiting or severe GI losses. Cardiovascular: S1 and S2, no rubs, no gallops, no murmurs. Respiratory: Good air movement bilaterally, no using accessory muscles, normal respiratory effort.  Positive scattered rhonchi, no wheezing or crackles. Abdomen: Soft, nontender, nondistended, positive bowel sounds Extremities: No cyanosis or clubbing.  Discharge Instructions   Discharge Instructions     Diet -  low sodium heart healthy   Complete by: As directed    Discharge instructions   Complete by: As directed    Take medications as prescribed Maintain adequate hydration Arrange follow-up with PCP in 14 days Continue to follow the 3 drug use (wearing mask, waiting 6 feet apart and constantly washing hands).   Increase activity slowly   Complete by: As directed      Allergies as of 03/17/2020      Reactions   Niaspan [niacin Er] Rash   Tape Rash, Other (See Comments)   Paper tape OK      Medication List    STOP taking these medications   clarithromycin 500 MG tablet Commonly known as: BIAXIN     TAKE these medications   acetaminophen 325 MG tablet Commonly known as: TYLENOL Take 2 tablets (650 mg total) by mouth every 6 (six) hours as needed for mild pain or headache (fever >/= 101).   aspirin EC 81 MG tablet Take 1 tablet (81 mg total) by mouth daily.   clotrimazole-betamethasone cream Commonly known as: LOTRISONE APPLY EXTERNALLY TO THE AFFECTED AND SURROUNDING AREAS TWICE DAILY IN THE MORNING AND IN THE EVENING   cyanocobalamin 1000 MCG/ML injection Commonly known as: (VITAMIN B-12) Inject 1,000 mcg into the muscle every 30 (thirty) days.   dexamethasone 6 MG tablet Commonly known as: Decadron Take 1 tablet (6 mg total) by mouth daily for 10 days.   EPINEPHrine 0.3 mg/0.3 mL Soaj injection Commonly known as: EPI-PEN Inject 0.3 mLs (0.3 mg total) into the muscle once.   FISH OIL PO Take 1,000 mg by mouth.   fluticasone 50 MCG/ACT nasal spray Commonly known as: FLONASE Place 2 sprays into both nostrils 2 (two) times daily as needed for allergies.   HYDROcodone-acetaminophen 5-325 MG tablet Commonly known as: NORCO/VICODIN Take 1 tablet by mouth every 6 (six) hours as needed for moderate pain.   levothyroxine 50 MCG tablet Commonly known as: SYNTHROID Take 50 mcg by mouth daily.   ondansetron 8 MG disintegrating tablet Commonly known as: Zofran  ODT Take 1 tablet (8 mg total) by mouth every 8 (eight) hours as needed for nausea or vomiting.   pantoprazole 40 MG tablet Commonly known as: Protonix Take 1 tablet (40 mg total) by mouth daily.   ranitidine 300 MG tablet Commonly known as: ZANTAC Take 300 mg by mouth daily as needed for heartburn.   rizatriptan 10 MG tablet Commonly known as: MAXALT Take 10 mg by mouth as needed for migraine. May repeat in 2 hours if needed   topiramate 50 MG tablet Commonly known as: Topamax Take 1 tablet (50 mg total) by mouth 2 (two) times daily. Start 1 tablet daily x 1 week and then twice daily      Allergies  Allergen Reactions  . Niaspan [Niacin Er] Rash  . Tape Rash and Other (See Comments)    Paper tape OK   Follow-up Information    Redmond School, MD. Schedule an appointment as soon as possible for a visit in 14 day(s).   Specialty: Internal Medicine Contact information: [address] [place] [id number] [phone]           The results of significant diagnostics from this hospitalization (including imaging, microbiology, ancillary and laboratory) are listed below for reference.    Significant Diagnostic Studies: [place] Transrectal Complete  Result Date: 03/07/2020 CLINICAL DATA:  Prostate biopsy EXAM: IR ULTRASOUND GUIDED ASPIRATION/DRAINAGE; TRANSRECTAL ULTRASOUND; US BIOPSY PROSTATE MULTIPLE COMPARISON:  None. TECHNIQUE:  Ultrasound guidance was provided for transrectal prostate biopsy by urologist. No radiologist in attendance. Representative images are submitted. IMPRESSION: 1. Ultrasound guidance for prostate biopsy Electronically Signed   By: Lucrezia Europe M.D.   On: 03/07/2020 10:21   US Guided Needle Placement  Result Date: 03/07/2020 CLINICAL DATA:  Prostate biopsy EXAM: IR ULTRASOUND GUIDED ASPIRATION/DRAINAGE; TRANSRECTAL ULTRASOUND; US BIOPSY PROSTATE MULTIPLE COMPARISON:  None. TECHNIQUE: Ultrasound guidance was provided for transrectal prostate biopsy by  urologist. No radiologist in attendance. Representative images are submitted. IMPRESSION: 1. Ultrasound guidance for prostate biopsy Electronically Signed   By: Lucrezia Europe M.D.   On: 03/07/2020 10:21   CT Abdomen Pelvis W Contrast  Result Date: 03/16/2020 CLINICAL DATA:  Generalized weakness EXAM: CT ABDOMEN AND PELVIS WITH CONTRAST TECHNIQUE: Multidetector CT imaging of the abdomen and pelvis was performed using the standard protocol following bolus administration of intravenous contrast. CONTRAST:  141mL OMNIPAQUE IOHEXOL 300 MG/ML  SOLN COMPARISON:  01/19/2020 FINDINGS: Lower chest: Lung bases demonstrate patchy ground-glass opacities which were not seen on the prior exam. Stable subpleural nodule is noted in the left lower lobe. No sizable effusion is noted. Hepatobiliary: Fatty infiltration of the liver is noted. The gallbladder is partially distended. Pancreas: Unremarkable. No pancreatic ductal dilatation or surrounding inflammatory changes. Spleen: Normal in size without focal abnormality. Adrenals/Urinary Tract: Adrenal glands are unremarkable. Kidneys demonstrate a normal enhancement pattern bilaterally. No obstructive or inflammatory changes are seen. Few small nonobstructing stones are noted on the right. The left renal calculi seen previously are not as well appreciated on today's exam. The bladder is partially distended. Stomach/Bowel: The appendix has been surgically removed. Postsurgical changes are noted consistent with partial right colectomy. No obstructive changes are seen. Fluid is noted throughout colon without obstructive change. Small bowel and stomach appear within normal limits. Vascular/Lymphatic: Aortic atherosclerosis. No enlarged abdominal or pelvic lymph nodes. Reproductive: Prostate is unremarkable. Other: No abdominal wall hernia or abnormality. No abdominopelvic ascites. Musculoskeletal: Degenerative changes of lumbar spine are noted. IMPRESSION: Patchy ground-glass opacities in  the bases bilaterally not seen on the prior exam. This raises suspicion for atypical pneumonia. Correlation with COVID testing may be helpful. Nonobstructing right renal calculi. Postoperative changes. Stable subpleural nodule is noted on the left. This has been previously noted to be benign in etiology due to long-term stability. Electronically Signed   By: Inez Catalina M.D.   On: 03/16/2020 09:19   [place] PROSTATE BIOPSY MULTIPLE  Result Date: 03/07/2020 CLINICAL DATA:  Prostate biopsy EXAM: IR ULTRASOUND GUIDED ASPIRATION/DRAINAGE; TRANSRECTAL ULTRASOUND; US BIOPSY PROSTATE MULTIPLE COMPARISON:  None. TECHNIQUE: Ultrasound guidance was provided for transrectal prostate biopsy by urologist. No radiologist in attendance. Representative images are submitted. IMPRESSION: 1. Ultrasound guidance for prostate biopsy Electronically Signed   By: Lucrezia Europe M.D.   On: 03/07/2020 10:21    Microbiology: Recent Results (from the past 240 hour(s))  SARS Coronavirus 2 by RT PCR (hospital order, performed in [facility] hospital lab) Nasopharyngeal Nasopharyngeal Swab     Status: Abnormal   Collection Time: 03/16/20  9:59 AM   Specimen: Nasopharyngeal Swab  Result Value Ref Range Status   SARS Coronavirus 2 POSITIVE (A) NEGATIVE Final    Comment: RESULT CALLED TO, READ BACK BY AND VERIFIED WITH: TALBOT T. AT [id number] ON [id number] BY THOMPSON S.  (NOTE) SARS-CoV-2 target nucleic acids are DETECTED SARS-CoV-2 RNA is generally detectable in upper respiratory specimens  during the acute phase of infection.  Positive results are indicative  of the  presence of the identified virus, but do not rule out bacterial infection or co-infection with other pathogens not detected by the test.  Clinical correlation with patient history and  other diagnostic information is necessary to determine patient infection status.  The expected result is negative. Fact Sheet for Patients:   StrictlyIdeas.no  Fact  Sheet for Healthcare Providers:   BankingDealers.co.za   This test is not yet approved or cleared by the [place] FDA and  has been authorized for detection and/or diagnosis of SARS-CoV-2 by FDA under an Emergency Use Authorization (EUA).  This EUA will remain in effect (meaning this  test can be used) for the duration of  the COVID-19 declaration under Section 564(b)(1) of the Act, 21 U.S.C. section 360-bbb-3(b)(1), unless the authorization is terminated or revoked sooner. Performed at [facility], [address] [place], [place] [address]      Labs: Basic Metabolic Panel: Recent Labs  Lab 03/16/20 0731 03/16/20 2143 03/17/20 0736  NA 136  --  141  K 2.5*  --  3.5  CL 101  --  107  CO2 24  --  23  GLUCOSE 107*  --  98  BUN 15  --  11  CREATININE 1.13  --  1.01  CALCIUM 8.6*  --  8.5*  MG 1.9  --   --   PHOS 1.5* 2.7 3.0   Liver Function Tests: Recent Labs  Lab 03/16/20 0731 03/17/20 0736  AST 26 24  ALT 21 21  ALKPHOS 60 57  BILITOT 1.0 0.8  PROT 7.2 6.8  ALBUMIN 3.6 3.3*   CBC: Recent Labs  Lab 03/16/20 0731 03/17/20 0736  WBC 4.3 3.8*  NEUTROABS 2.6 2.0  HGB 13.7 12.8*  HCT 39.0 38.5*  MCV 88.6 89.7  PLT 178 180    Signed:  Barton Dubois MD.  [facility] 03/17/2020, 2:43 PM

## 2020-03-17 NOTE — Care Management Obs Status (Signed)
[facility] NOTIFICATION   Patient Details  Name: Eduardo [name] MRN: [id number] Date of Birth: 1946-09-13   Medicare Observation Status Notification Given:  Yes(Brandi, RN will deliver letter due to contact precautions)    Tommy Medal 03/17/2020, 2:48 PM

## 2020-03-29 ENCOUNTER — Other Ambulatory Visit: Payer: Self-pay | Admitting: Internal Medicine

## 2020-03-29 DIAGNOSIS — U071 COVID-19: Secondary | ICD-10-CM

## 2020-03-30 ENCOUNTER — Encounter (HOSPITAL_COMMUNITY)
Admission: RE | Admit: 2020-03-30 | Discharge: 2020-03-30 | Disposition: A | Discharge status: Home / Self Care | Payer: 59 | Source: Ambulatory Visit | Attending: Urology | Admitting: Urology

## 2020-03-30 ENCOUNTER — Encounter (HOSPITAL_COMMUNITY): Payer: Self-pay

## 2020-03-30 ENCOUNTER — Other Ambulatory Visit: Payer: Self-pay

## 2020-03-30 ENCOUNTER — Ambulatory Visit (HOSPITAL_COMMUNITY)
Admission: RE | Admit: 2020-03-30 | Discharge: 2020-03-30 | Disposition: A | Discharge status: Home / Self Care | Payer: 59 | Source: Ambulatory Visit | Attending: Urology | Admitting: Urology

## 2020-03-30 DIAGNOSIS — C61 Malignant neoplasm of prostate: Secondary | ICD-10-CM | POA: Diagnosis not present

## 2020-03-30 HISTORY — DX: Malignant neoplasm of prostate: C61

## 2020-03-30 MED ORDER — TECHNETIUM TC 99M MEDRONATE IV KIT
20.0000 | PACK | Freq: Once | INTRAVENOUS | Status: AC | PRN
  Administered 2020-03-30: 20.7 via INTRAVENOUS

## 2020-04-04 ENCOUNTER — Ambulatory Visit (INDEPENDENT_AMBULATORY_CARE_PROVIDER_SITE_OTHER): Payer: 59 | Admitting: Urology

## 2020-04-04 ENCOUNTER — Other Ambulatory Visit: Payer: Self-pay

## 2020-04-04 VITALS — BP 107/68 | HR 76 | Temp 97.5°F

## 2020-04-04 DIAGNOSIS — C61 Malignant neoplasm of prostate: Secondary | ICD-10-CM

## 2020-04-04 NOTE — H&P (Signed)
H&P    History of Present Illness: This man comes in with his daughter for prostate cancer conference.  He underwent ultrasound and biopsy of his prostate on 5.18.2021.  PSA 5.7, prostate volume 23.4 mL.  PSA density 0.24.  8/12 cores revealed adenocarcinoma, as follows: 2 cores (left apex lateral, left apex medial) GS 4+3 and 70% 30% of core, respectively 5 cores (left mid lateral, left mid medial, right mid medial, right mid lateral, right base lateral) revealed GS 3+4 pattern-70%, 50%, 30%, 50% and 3% of cores involved) 1 core (right apex lateral) revealed GS 3+3 pattern in 5% of core CT revealed postoperative colonic changes, groundglass opacities of lungs (soon thereafter diagnosed with Covid), bone scan negative except for questionable increased uptake at T2 vertebral body.  Plain x-ray recommended for correlation, not performed yet.  Surgical history is significant for partial colectomy with complication/perforation, enterocutaneous fistula with eventual repair.  Past Medical History:  Diagnosis Date  . Chronic pain syndrome   . Colon cancer ([place]) 10/20/13   colon resection-   . DDD (degenerative disc disease), cervical   . GERD (gastroesophageal reflux disease)   . History of kidney stones    seen on imaging but never knowingly passed spontaneously. Never had any manipulation surgery/procedure for it.   Marland Kitchen History of seasonal allergies   . Kidney stone   . Kidney stones   . Migraine    "maybe 1-2/month" (06/20/2015)  . Neuropathy 2010  . Primary osteoarthritis   . Prostate cancer ([place])   . Skin cancer of face    "froze them off"  . Stroke [facility])    pt denies  . Wears glasses     Past Surgical History:  Procedure Laterality Date  . ANTERIOR CERVICAL DECOMP/DISCECTOMY FUSION N/A 09/26/2017   Procedure: ACDF - C3-C4 - C5-C6 - C6-C7;  Surgeon: Earnie Larsson, MD;  Location: [facility];  Service: Neurosurgery;  Laterality: N/A;  . APPENDECTOMY  10/20/13   (with right hemicolectomy)   . CARPAL TUNNEL RELEASE Left 2011  . CARPAL TUNNEL RELEASE Right 01/07/2013   Procedure: CARPAL TUNNEL RELEASE;  Surgeon: Tennis Must, MD;  Location: [facility];  Service: Orthopedics;  Laterality: Right;  . COLON RESECTION N/A 10/20/2013   Procedure:  LAPAROSCOPIC hand assisted partial colectomy;  Surgeon: Jamesetta So, MD;  Location: [facility];  Service: General;  Laterality: N/A;  . COLON SURGERY  2014   x2- 2nd. to correct a perforation , treated with TPN & drain & then later had to remove more colon   . COLONOSCOPY    . COLONOSCOPY N/A 10/18/2013   Procedure: COLONOSCOPY;  Surgeon: Danie Binder, MD;  [facility];  Service: Endoscopy;  Laterality: N/A;  2:00-moved to 1030 Pam to notify pt  . COLONOSCOPY N/A 09/22/2014   Procedure: COLONOSCOPY;  Surgeon: Rogene Houston, MD;  [facility];  Service: Endoscopy;  Laterality: N/A;  1200  . COLONOSCOPY N/A 11/20/2017   Procedure: COLONOSCOPY;  Surgeon: Rogene Houston, MD;  [facility];  Service: Endoscopy;  Laterality: N/A;  955  . ESOPHAGOGASTRODUODENOSCOPY N/A 06/19/2016   Procedure: ESOPHAGOGASTRODUODENOSCOPY (EGD);  Surgeon: Rogene Houston, MD;  [facility];  Service: Endoscopy;  Laterality: N/A;  3:00  . EVALUATION UNDER ANESTHESIA WITH HEMORRHOIDECTOMY N/A 08/06/2018   Procedure: EXAM UNDER ANESTHESIA WITHHEMORRHOIDECTOMY ERAS PATHWAY;  Surgeon: Erroll Luna, MD;  Location: [facility];  Service: General;  Laterality: N/A;  .  EXCISIONAL HEMORRHOIDECTOMY    . INCISIONAL HERNIA REPAIR  06/20/2015   WITH MYOFASCIAL ADVANCEMENT FLAP AND MESH   . INCISIONAL HERNIA REPAIR N/A 06/20/2015   Procedure: INCISIONAL HERNIA REPAIR WITH MYOFASCIAL ADVANCEMENT FLAP AND MESH;  Surgeon: Erroll Luna, MD;  Location: [facility];  Service: General;  Laterality: N/A;  . INGUINAL HERNIA REPAIR Left    "@ [facility]"  . INGUINAL HERNIA REPAIR Bilateral    "[place] [facility]"  . INSERTION  OF MESH N/A 06/20/2015   Procedure: INSERTION OF MESH;  Surgeon: Erroll Luna, MD;  Location: [facility];  Service: General;  Laterality: N/A;  . KNEE ARTHROSCOPY Bilateral 2001  . NASAL SEPTUM SURGERY  1980's  . TAKE DOWN OF INTESTINAL FISTULA N/A 02/02/2014   Procedure: TAKE DOWN OF ENTEROCUTANEOUS  FISTULA;  Surgeon: Marcello Moores A. Cornett, MD;  Location: [facility];  Service: General;  Laterality: N/A;  . TONSILLECTOMY    . UPPER GI ENDOSCOPY      Home Medications:  Allergies as of 04/04/2020      Reactions   Niaspan [niacin Er] Rash   Tape Rash, Other (See Comments)   Paper tape OK      Medication List       Accurate as of April 04, 2020  4:53 PM. If you have any questions, ask your nurse or doctor.        acetaminophen 325 MG tablet Commonly known as: TYLENOL Take 2 tablets (650 mg total) by mouth every 6 (six) hours as needed for mild pain or headache (fever >/= 101).   aspirin EC 81 MG tablet Take 1 tablet (81 mg total) by mouth daily.   clotrimazole-betamethasone cream Commonly known as: LOTRISONE APPLY EXTERNALLY TO THE AFFECTED AND SURROUNDING AREAS TWICE DAILY IN THE MORNING AND IN THE EVENING   cyanocobalamin 1000 MCG/ML injection Commonly known as: (VITAMIN B-12) Inject 1,000 mcg into the muscle every 30 (thirty) days.   EPINEPHrine 0.3 mg/0.3 mL Soaj injection Commonly known as: EPI-PEN Inject 0.3 mLs (0.3 mg total) into the muscle once.   FISH OIL PO Take 1,000 mg by mouth.   fluticasone 50 MCG/ACT nasal spray Commonly known as: FLONASE Place 2 sprays into both nostrils 2 (two) times daily as needed for allergies.   HYDROcodone-acetaminophen 5-325 MG tablet Commonly known as: NORCO/VICODIN Take 1 tablet by mouth every 6 (six) hours as needed for moderate pain.   levothyroxine 50 MCG tablet Commonly known as: SYNTHROID Take 50 mcg by mouth daily.   ondansetron 8 MG disintegrating tablet Commonly known as: Zofran ODT Take 1 tablet (8 mg total) by mouth every  8 (eight) hours as needed for nausea or vomiting.   pantoprazole 40 MG tablet Commonly known as: Protonix Take 1 tablet (40 mg total) by mouth daily.   Potassium Chloride ER 20 MEQ Tbcr Take 1 tablet by mouth 2 (two) times daily.   ranitidine 300 MG tablet Commonly known as: ZANTAC Take 300 mg by mouth daily as needed for heartburn.   rizatriptan 10 MG tablet Commonly known as: MAXALT Take 10 mg by mouth as needed for migraine. May repeat in 2 hours if needed   topiramate 50 MG tablet Commonly known as: Topamax Take 1 tablet (50 mg total) by mouth 2 (two) times daily. Start 1 tablet daily x 1 week and then twice daily       Allergies:  Allergies  Allergen Reactions  . Niaspan [Niacin Er] Rash  . Tape Rash and Other (See Comments)  Paper tape OK    Family History  Problem Relation Age of Onset  . Colon polyps Father   . Colon polyps Paternal Uncle   . Colon polyps Maternal Grandfather     Social History:  reports that he has never smoked. He has never used smokeless tobacco. He reports that he does not drink alcohol and does not use drugs.  ROS: A complete review of systems was performed.  All systems are negative except for pertinent findings as noted.    I have reviewed prior pt notes  I have reviewed notes from referring/previous physicians  I have reviewed urinalysis results  I have independently reviewed prior imaging  I have reviewed prior PSA/pathology results   Impression/Assessment:  Relatively high volume, intermediate risk prostate cancer.  He does need thoracic spine films to follow-up bone scan.  However, I feel he is at low risk for having metastatic disease with his PSA.  Plan:  I discussed the patient's pathology results with he and his daughter, as well as imaging.  We discussed treatment options, basically focusing on surgery and radiation (with his intermediate risk prostate cancer he is not a candidate for brachytherapy alone).  Risks,  complications, use of adjuvant androgen deprivation therapy as well as side effects of that were discussed.  Long and short-term complications from radiation and surgery were also discussed.  He is aware that having had colon surgery with complication will possibly make his surgery more difficult.  That said, he would like to start with a surgical consultation.  I will set up an appointment for him to see Dr. Tresa Moore, which will be preceded by thoracic films.  57 minutes spent face to face in consultation

## 2020-04-18 DIAGNOSIS — C61 Malignant neoplasm of prostate: Secondary | ICD-10-CM | POA: Diagnosis not present

## 2020-04-18 DIAGNOSIS — N200 Calculus of kidney: Secondary | ICD-10-CM | POA: Diagnosis not present

## 2020-05-09 ENCOUNTER — Ambulatory Visit
Admission: RE | Admit: 2020-05-09 | Discharge: 2020-05-09 | Disposition: A | Discharge status: Home / Self Care | Payer: 59 | Source: Ambulatory Visit | Attending: Radiation Oncology | Admitting: Radiation Oncology

## 2020-05-09 ENCOUNTER — Other Ambulatory Visit: Payer: Self-pay

## 2020-05-09 ENCOUNTER — Encounter: Payer: Self-pay | Admitting: Radiation Oncology

## 2020-05-09 VITALS — Ht 74.0 in | Wt 205.0 lb

## 2020-05-09 DIAGNOSIS — T8189XA Other complications of procedures, not elsewhere classified, initial encounter: Secondary | ICD-10-CM | POA: Insufficient documentation

## 2020-05-09 DIAGNOSIS — K6289 Other specified diseases of anus and rectum: Secondary | ICD-10-CM | POA: Insufficient documentation

## 2020-05-09 DIAGNOSIS — C61 Malignant neoplasm of prostate: Secondary | ICD-10-CM

## 2020-05-09 DIAGNOSIS — N200 Calculus of kidney: Secondary | ICD-10-CM | POA: Insufficient documentation

## 2020-05-09 DIAGNOSIS — D510 Vitamin B12 deficiency anemia due to intrinsic factor deficiency: Secondary | ICD-10-CM | POA: Insufficient documentation

## 2020-05-09 DIAGNOSIS — G629 Polyneuropathy, unspecified: Secondary | ICD-10-CM | POA: Insufficient documentation

## 2020-05-09 NOTE — Progress Notes (Signed)
GU Location of Tumor / Histology: prostatic adenocarcinoma  If Prostate Cancer, Gleason Score is (4 + 3) and PSA is (5.0). Prostate volume: 54.  Eduardo [name] was referred by Dr. Tresa Moore for consideration of radiotherapy due to extensive abdominal surgery and age greater than 61 years.   Biopsies of prostate (if applicable) revealed:   Past/Anticipated interventions by urology, if any: prostate biopsy, CT abd/pelvis (negative), bone scan (negative), referral to Dr. Tammi Klippel for consideration of radiotherapy  Past/Anticipated interventions by medical oncology, if any: no  Weight changes, if any: no  Bowel/Bladder complaints, if any: IPSS 0. SHIM 18. Denies dysuria, hematuria or leakage. Has a right hemicolectomy as a result of colon ca.    Nausea/Vomiting, if any: yes, has medication to manage  Pain issues, if any:  no  SAFETY ISSUES:  Prior radiation? no  Pacemaker/ICD? no  Possible current pregnancy? no, male patient  Is the patient on methotrexate? no  Current Complaints / other details:  74 year old male. Married with 1 daughter. Resides in [place].

## 2020-05-09 NOTE — Progress Notes (Signed)
Radiation Oncology         [phone] ________________________________  Initial Outpatient Consultation - Conducted via Telephone due to current COVID-19 concerns for limiting patient exposure  Name: Eduardo [name] MRN: [id number]  Date: 05/09/2020  DOB: 07/16/1946  OT:LXBWI, Purcell Nails, MD  Eduardo Frock, MD   REFERRING PHYSICIAN: Alexis Frock, MD  DIAGNOSIS: 74 y.o. gentleman with Stage T1c adenocarcinoma of the prostate with Gleason score of 4+3, and PSA of 5.6.    ICD-10-CM   1. Malignant neoplasm of prostate (Eduardo [name])  C61   2. Prostate cancer (Eduardo [name])  C61     HISTORY OF PRESENT ILLNESS: Eduardo [name] is a 75 y.o. male with a diagnosis of prostate cancer. He has a history of low grade colon cancer, diagnosed in 2014 s/p right hemicolectomy with Dr. Arnoldo Morale in 09/2013.  More recently, he was noted to have an elevated PSA of 6.1 by his primary care physician, Dr. Gerarda Fraction.  Accordingly, he was referred for evaluation in urology by Dr. Diona [name] on 01/25/2020,  digital rectal examination was performed at that time revealing no nodules.  A repeat PSA performed that day remained elevated at 5.6.  Therefore, the patient proceeded to transrectal ultrasound with 12 biopsies of the prostate on 03/07/2020.  The prostate volume measured 23.4 cc.  Out of 12 core biopsies, 8 were positive.  The maximum Gleason score was 4+3, and this was seen in the left apex lateral and left apex. Additionally, Gleason 3+4 was seen in the left mid lateral, left mid, right mid, right mid lateral, and right base lateral (small focus, with PNI) and a small focus of Gleason 3+3 was noted in the right apex lateral.  He was seen in the ED on 03/16/2020 with generalized weakness, nausea, and dry cough and was diagnosed with Covid-19. CT A/P performed at that time demonstrated possible pneumonia related to Covid-19 but no acute pelvic findings or lymphadenopathy.  A bone scan was performed on 03/30/2020 to complete his staging  workup. This showed nonspecific increased radiotracer uptake at left lateral aspect of upper thoracic spine, approximately T2, without CT correlate and without any other concerning sites of uptake. Plain films were recommended for correlation, but this does not appear to have been performed.  The patient reviewed the biopsy results with his urologist and he has kindly been referred today for discussion of potential radiation treatment options. He met with Dr. Tresa Moore on 04/18/2020 to discuss his surgical treatment options and given his history of colon cancer and multiple prior abdominal surgeries, he is not felt to be a good candidate for robotic prostatectomy.   PREVIOUS RADIATION THERAPY: No  PAST MEDICAL HISTORY:  Past Medical History:  Diagnosis Date  . Chronic pain syndrome   . Colon cancer ([place]) 10/20/13   colon resection-   . DDD (degenerative disc disease), cervical   . GERD (gastroesophageal reflux disease)   . History of kidney stones    seen on imaging but never knowingly passed spontaneously. Never had any manipulation surgery/procedure for it.   Marland Kitchen History of seasonal allergies   . Kidney stone   . Kidney stones   . Migraine    "maybe 1-2/month" (06/20/2015)  . Neuropathy 2010  . Primary osteoarthritis   . Prostate cancer (Eduardo [name])   . Skin cancer of face    "froze them off"  . Stroke [facility])    pt denies  . Wears glasses       PAST SURGICAL HISTORY: Past Surgical History:  Procedure Laterality Date  . ANTERIOR CERVICAL DECOMP/DISCECTOMY FUSION N/A 09/26/2017   Procedure: ACDF - C3-C4 - C5-C6 - C6-C7;  Surgeon: Earnie Larsson, MD;  Location: [place];  Service: Neurosurgery;  Laterality: N/A;  . APPENDECTOMY  10/20/13   (with right hemicolectomy)  . CARPAL TUNNEL RELEASE Left 2011  . CARPAL TUNNEL RELEASE Right 01/07/2013   Procedure: CARPAL TUNNEL RELEASE;  Surgeon: Tennis Must, MD;  Location: [place];  Service: Orthopedics;  Laterality: Right;  . COLON  RESECTION N/A 10/20/2013   Procedure:  LAPAROSCOPIC hand assisted partial colectomy;  Surgeon: Jamesetta So, MD;  [facility];  Service: General;  Laterality: N/A;  . COLON SURGERY  2014   x2- 2nd. to correct a perforation , treated with TPN & drain & then later had to remove more colon   . COLONOSCOPY    . COLONOSCOPY N/A 10/18/2013   Procedure: COLONOSCOPY;  Surgeon: Danie Binder, MD;  [facility];  Service: Endoscopy;  Laterality: N/A;  2:00-moved to 1030 Pam to notify pt  . COLONOSCOPY N/A 09/22/2014   Procedure: COLONOSCOPY;  Surgeon: Rogene Houston, MD;  [facility];  Service: Endoscopy;  Laterality: N/A;  1200  . COLONOSCOPY N/A 11/20/2017   Procedure: COLONOSCOPY;  Surgeon: Rogene Houston, MD;  [facility];  Service: Endoscopy;  Laterality: N/A;  955  . ESOPHAGOGASTRODUODENOSCOPY N/A 06/19/2016   Procedure: ESOPHAGOGASTRODUODENOSCOPY (EGD);  Surgeon: Rogene Houston, MD;  [facility];  Service: Endoscopy;  Laterality: N/A;  3:00  . EVALUATION UNDER ANESTHESIA WITH HEMORRHOIDECTOMY N/A 08/06/2018   Procedure: [place];  Surgeon: Erroll Luna, MD;  Location: [place];  Service: General;  Laterality: N/A;  . EXCISIONAL HEMORRHOIDECTOMY    . INCISIONAL HERNIA REPAIR  06/20/2015   WITH MYOFASCIAL ADVANCEMENT FLAP AND MESH   . INCISIONAL HERNIA REPAIR N/A 06/20/2015   Procedure: INCISIONAL HERNIA REPAIR WITH MYOFASCIAL ADVANCEMENT FLAP AND MESH;  Surgeon: Erroll Luna, MD;  Location: [place];  Service: General;  Laterality: N/A;  . INGUINAL HERNIA REPAIR Left    "@ [facility]"  . INGUINAL HERNIA REPAIR Bilateral    "[place]"  . INSERTION OF MESH N/A 06/20/2015   Procedure: INSERTION OF MESH;  Surgeon: Erroll Luna, MD;  Location: [place];  Service: General;  Laterality: N/A;  . KNEE ARTHROSCOPY Bilateral 2001  . NASAL SEPTUM SURGERY  1980's  . TAKE DOWN OF INTESTINAL  FISTULA N/A 02/02/2014   Procedure: TAKE DOWN OF ENTEROCUTANEOUS  FISTULA;  Surgeon: Marcello Moores A. Cornett, MD;  Location: [place];  Service: General;  Laterality: N/A;  . TONSILLECTOMY    . UPPER GI ENDOSCOPY      FAMILY HISTORY:  Family History  Problem Relation Age of Onset  . Colon polyps Father   . Colon cancer Father   . Colon polyps Paternal Uncle   . Colon polyps Maternal Grandfather   . Cancer Maternal Grandfather   . Breast cancer Mother   . Ovarian cancer Mother   . Kidney cancer Mother   . Cancer Maternal Uncle   . Prostate cancer Neg Hx     SOCIAL HISTORY:  Social History   Socioeconomic History  . Marital status: Married    Spouse name: Not on file  . Number of children: 1  . Years of education: Not on file  . Highest education level: Not on file  Occupational History  Comment: retired  Tobacco Use  . Smoking status: Never Smoker  . Smokeless tobacco: Never Used  Vaping Use  . Vaping Use: Never used  Substance and Sexual Activity  . Alcohol use: No  . Drug use: No  . Sexual activity: Not Currently    Birth control/protection: None  Other Topics Concern  . Not on file  Social History Narrative  . Not on file   Social Determinants of Health   Financial Resource Strain:   . Difficulty of Paying Living Expenses:   Food Insecurity:   . Worried About Charity fundraiser in the Last Year:   . Arboriculturist in the Last Year:   Transportation Needs:   . Film/video editor (Medical):   Marland Kitchen Lack of Transportation (Non-Medical):   Physical Activity:   . Days of Exercise per Week:   . Minutes of Exercise per Session:   Stress:   . Feeling of Stress :   Social Connections:   . Frequency of Communication with Friends and Family:   . Frequency of Social Gatherings with Friends and Family:   . Attends Religious Services:   . Active Member of Clubs or Organizations:   . Attends Archivist Meetings:   Marland Kitchen Marital Status:   Intimate Partner  Violence:   . Fear of Current or Ex-Partner:   . Emotionally Abused:   Marland Kitchen Physically Abused:   . Sexually Abused:     ALLERGIES: Niaspan [niacin er] and Tape  MEDICATIONS:  Current Outpatient Medications  Medication Sig Dispense Refill  . acetaminophen (TYLENOL) 325 MG tablet Take 2 tablets (650 mg total) by mouth every 6 (six) hours as needed for mild pain or headache (fever >/= 101). 40 tablet 0  . clotrimazole-betamethasone (LOTRISONE) cream APPLY EXTERNALLY TO THE AFFECTED AND SURROUNDING AREAS TWICE DAILY IN THE MORNING AND IN THE EVENING    . cyanocobalamin (,VITAMIN B-12,) 1000 MCG/ML injection Inject 1,000 mcg into the muscle every 30 (thirty) days.    . fluticasone (FLONASE) 50 MCG/ACT nasal spray Place 2 sprays into both nostrils 2 (two) times daily as needed for allergies.     Marland Kitchen HYDROcodone-acetaminophen (NORCO/VICODIN) 5-325 MG tablet Take 1 tablet by mouth every 6 (six) hours as needed for moderate pain.    Marland Kitchen ondansetron (ZOFRAN ODT) 8 MG disintegrating tablet Take 1 tablet (8 mg total) by mouth every 8 (eight) hours as needed for nausea or vomiting. 20 tablet 0  . pantoprazole (PROTONIX) 40 MG tablet Take 1 tablet (40 mg total) by mouth daily. 30 tablet 0  . ranitidine (ZANTAC) 300 MG tablet Take 300 mg by mouth daily as needed for heartburn.    . rizatriptan (MAXALT) 10 MG tablet Take 10 mg by mouth as needed for migraine. May repeat in 2 hours if needed    . EPINEPHrine 0.3 mg/0.3 mL IJ SOAJ injection Inject 0.3 mLs (0.3 mg total) into the muscle once. (Patient not taking: Reported on 05/09/2020) 1 Device 0  . topiramate (TOPAMAX) 50 MG tablet Take 1 tablet (50 mg total) by mouth 2 (two) times daily. Start 1 tablet daily x 1 week and then twice daily (Patient not taking: Reported on 04/04/2020) 60 tablet 2   No current facility-administered medications for this encounter.    REVIEW OF SYSTEMS:  On review of systems, the patient reports that he is doing well overall. He denies  any chest pain, shortness of breath, cough, fevers, chills, night sweats, unintended weight changes. He  denies any bowel disturbances, and denies abdominal pain, nausea or vomiting. He denies any new musculoskeletal or joint aches or pains. His IPSS was 0, indicating no urinary symptoms. His SHIM was 18, indicating he has moderate erectile dysfunction. A complete review of systems is obtained and is otherwise negative.    PHYSICAL EXAM:  Wt Readings from Last 3 Encounters:  05/09/20 205 lb (93 kg)  03/16/20 205 lb (93 kg)  01/25/20 215 lb (97.5 kg)   Temp Readings from Last 3 Encounters:  04/04/20 (!) 97.5 F (36.4 C)  03/17/20 98.1 F (36.7 C) (Oral)  03/07/20 98.7 F (37.1 C) (Oral)   BP Readings from Last 3 Encounters:  04/04/20 107/68  03/17/20 120/83  03/07/20 124/87   Pulse Readings from Last 3 Encounters:  04/04/20 76  03/17/20 72  03/07/20 90   Pain Assessment Pain Score: 0-No pain/10  Physical exam not performed in light of telephone consult visit format.   KPS = 90  100 - Normal; no complaints; no evidence of disease. 90   - Able to carry on normal activity; minor signs or symptoms of disease. 80   - Normal activity with effort; some signs or symptoms of disease. 68   - Cares for self; unable to carry on normal activity or to do active work. 60   - Requires occasional assistance, but is able to care for most of his personal needs. 50   - Requires considerable assistance and frequent medical care. 52   - Disabled; requires special care and assistance. 45   - Severely disabled; hospital admission is indicated although death not imminent. 42   - Very sick; hospital admission necessary; active supportive treatment necessary. 10   - Moribund; fatal processes progressing rapidly. 0     - Dead  Karnofsky DA, Abelmann Oakley, Craver LS and Burchenal [facility] [phone]) The use of the nitrogen mustards in the palliative treatment of carcinoma: with particular reference to  bronchogenic carcinoma Cancer 1 634-56  LABORATORY DATA:  Lab Results  Component Value Date   WBC 3.8 (L) 03/17/2020   HGB 12.8 (L) 03/17/2020   HCT 38.5 (L) 03/17/2020   MCV 89.7 03/17/2020   PLT 180 03/17/2020   Lab Results  Component Value Date   NA 141 03/17/2020   K 3.5 03/17/2020   CL 107 03/17/2020   CO2 23 03/17/2020   Lab Results  Component Value Date   ALT 21 03/17/2020   AST 24 03/17/2020   ALKPHOS 57 03/17/2020   BILITOT 0.8 03/17/2020     RADIOGRAPHY: No results found.    IMPRESSION/PLAN: This visit was conducted via Telephone to spare the patient unnecessary potential exposure in the healthcare setting during the current COVID-19 pandemic. 1. 74 y.o. gentleman with Stage T1c adenocarcinoma of the prostate with Gleason Score of 4+3, and PSA of 5.6. We discussed the patient's workup and outlined the nature of prostate cancer in this setting. The patient's T stage, Gleason's score, and PSA put him into the unfavorable intermediate risk group. Accordingly, he is eligible for a variety of potential treatment options including brachytherapy, 5.5 weeks of external radiation, or prostatectomy. We discussed the available radiation techniques, and focused on the details and logistics of delivery. We discussed and outlined the risks, benefits, short and long-term effects associated with radiotherapy and compared and contrasted these with prostatectomy. We discussed the role of SpaceOAR in reducing the rectal toxicity associated with radiotherapy. We also detailed the role of ADT in the treatment  of unfavorable intermediate risk prostate cancer and outlined the associated side effects that could be expected with this therapy. He was encouraged to ask questions that were answered to his stated satisfaction  At the end of the conversation, the patient appears to be leaning towards moving forward with ST-ADT concurrent with brachytherapy but would like to take additional time to  consider and further discuss his options with his wife when she returns from [place] this Friday. We will share our discussion with Dr. Diona [name] and look forward to hearing from the patient regarding his treatment preference.  At that point, we will move forward with treatment planning accordingly.  We enjoyed meeting him today and look forward to continuing to participate in his care.  Given current concerns for patient exposure during the COVID-19 pandemic, this encounter was conducted via telephone. The patient was notified in advance and was offered a MyChart meeting to allow for face to face communication but unfortunately reported that he did not have the appropriate resources/technology to support such a visit and instead preferred to proceed with telephone consult. The patient has given verbal consent for this type of encounter. The time spent during this encounter was 60 minutes. The attendants for this meeting include Tyler Pita MD, Ashlyn Bruning PA-C, [place], and patient, LAVARR PRESIDENT. During the encounter, Tyler Pita MD, Ashlyn Bruning PA-C, and scribe, Wilburn Mylar were located at [place].  Patient, DAILY CRATE was located at home.    Nicholos Johns, PA-C    Tyler Pita, MD  [facility] Direct Dial: [phone]  Fax: [phone] [email or web address]  Skype  LinkedIn  This document serves as a record of services personally performed by Tyler Pita, MD and Freeman Caldron, PA-C. It was created on their behalf by Wilburn Mylar, a trained medical scribe. The creation of this record is based on the scribe's personal observations and the provider's statements to them. This document has been checked and approved by the attending provider.

## 2020-05-25 ENCOUNTER — Encounter: Payer: Self-pay | Admitting: Medical Oncology

## 2020-05-25 NOTE — Progress Notes (Signed)
Left message to introduce myself as the prostate nurse navigator and discuss my role. I was unable to meet him 7/20, during consult with Dr. Tammi Klippel and Jaynee Eagles. He was going to discuss his options with his wife before making his final decision. I asked him to return my call to discuss his decision and/or to answer questions or concerns.

## 2020-06-08 ENCOUNTER — Encounter: Payer: Self-pay | Admitting: Medical Oncology

## 2020-06-08 NOTE — Progress Notes (Signed)
Spoke with patient to follow up on his treatment decision. He states he is going with surgery with Dr. Tresa Moore. I wished him well and asked him to call if we can assist in the future.

## 2020-07-10 DIAGNOSIS — C61 Malignant neoplasm of prostate: Secondary | ICD-10-CM | POA: Diagnosis not present

## 2020-07-10 DIAGNOSIS — N200 Calculus of kidney: Secondary | ICD-10-CM | POA: Diagnosis not present

## 2020-07-17 ENCOUNTER — Other Ambulatory Visit: Payer: Self-pay | Admitting: Urology

## 2020-07-24 DIAGNOSIS — E538 Deficiency of other specified B group vitamins: Secondary | ICD-10-CM | POA: Diagnosis not present

## 2020-07-24 DIAGNOSIS — Z6830 Body mass index (BMI) 30.0-30.9, adult: Secondary | ICD-10-CM | POA: Diagnosis not present

## 2020-07-24 DIAGNOSIS — E6609 Other obesity due to excess calories: Secondary | ICD-10-CM | POA: Diagnosis not present

## 2020-07-24 DIAGNOSIS — G894 Chronic pain syndrome: Secondary | ICD-10-CM | POA: Diagnosis not present

## 2020-07-26 NOTE — Patient Instructions (Addendum)
DUE TO COVID-19 ONLY ONE VISITOR IS ALLOWED TO COME WITH YOU AND STAY IN THE WAITING ROOM ONLY DURING PRE OP AND PROCEDURE DAY OF SURGERY. THE 1 VISITOR  MAY VISIT WITH YOU AFTER SURGERY IN YOUR PRIVATE ROOM DURING VISITING HOURS ONLY!  YOU NEED TO HAVE A COVID 19 TEST ON_10/9______ @_11 :30______, THIS TEST MUST BE DONE BEFORE SURGERY,  COVID TESTING SITE [place] [address], IT IS ON THE RIGHT GOING OUT [address] APPROXIMATELY  2 MINUTES PAST [employer] ON THE RIGHT. ONCE YOUR COVID TEST IS COMPLETED,  PLEASE BEGIN THE QUARANTINE INSTRUCTIONS AS OUTLINED IN YOUR HANDOUT.                [place]    Your procedure is scheduled on: 08/02/20   Report to [facility] Main  Entrance   Report to admitting at  10:00 AM     Call this number if you have problems the morning of surgery [phone]    Remember: Do not eat food or drink liquids :After Midnight  . BRUSH YOUR TEETH MORNING OF SURGERY AND RINSE YOUR MOUTH OUT, NO CHEWING GUM CANDY OR MINTS.     Take these medicines the morning of surgery with A SIP OF WATER: Flonase. Pain med if needed                                 You may not have any metal on your body including              piercings  Do not wear jewelry,  lotions, powders or deodorant                   Men may shave face and neck.   Do not bring valuables to the hospital. [place].  Contacts, dentures or bridgework may not be worn into surgery.       Patients discharged the day of surgery will not be allowed to drive home.   IF YOU ARE HAVING SURGERY AND GOING HOME THE SAME DAY, YOU MUST HAVE AN ADULT TO DRIVE YOU HOME AND BE WITH YOU FOR 24 HOURS.   YOU MAY GO HOME BY TAXI OR UBER OR ORTHERWISE, BUT AN ADULT MUST ACCOMPANY YOU HOME AND STAY WITH YOU FOR 24 HOURS.  Name and phone number of your driver:  Special Instructions: N/A              Please read over the  following fact sheets you were given: _____________________________________________________________________             [facility] - Preparing for Surgery  Before surgery, you can play an important role.   Because skin is not sterile, your skin needs to be as free of germs as possible.   You can reduce the number of germs on your skin by washing with CHG (chlorahexidine gluconate) soap before surgery.   CHG is an antiseptic cleaner which kills germs and bonds with the skin to continue killing germs even after washing. Please DO NOT use if you have an allergy to CHG or antibacterial soaps.   If your skin becomes reddened/irritated stop using the CHG and inform your nurse when you arrive at Short Stay.  You may shave your face/neck.  Please follow  these instructions carefully:  1.  Shower with CHG Soap the night before surgery and the  morning of Surgery.  2.  If you choose to wash your hair, wash your hair first as usual with your  normal  shampoo.  3.  After you shampoo, rinse your hair and body thoroughly to remove the  shampoo.                                        4.  Use CHG as you would any other liquid soap.  You can apply chg directly  to the skin and wash                       Gently with a scrungie or clean washcloth.  5.  Apply the CHG Soap to your body ONLY FROM THE NECK DOWN.   Do not use on face/ open                           Wound or open sores. Avoid contact with eyes, ears mouth and genitals (private parts).                       Wash face,  Genitals (private parts) with your normal soap.             6.  Wash thoroughly, paying special attention to the area where your surgery  will be performed.  7.  Thoroughly rinse your body with warm water from the neck down.  8.  DO NOT shower/wash with your normal soap after using and rinsing off  the CHG Soap.             9.  Pat yourself dry with a clean towel.            10.  Wear clean pajamas.            11.  Place clean  sheets on your bed the night of your first shower and do not  sleep with pets. Day of Surgery : Do not apply any lotions/deodorants the morning of surgery.  Please wear clean clothes to the hospital/surgery center.  FAILURE TO FOLLOW THESE INSTRUCTIONS MAY RESULT IN THE CANCELLATION OF YOUR SURGERY PATIENT SIGNATURE_________________________________  NURSE SIGNATURE__________________________________  ________________________________________________________________________

## 2020-07-27 ENCOUNTER — Other Ambulatory Visit: Payer: Self-pay

## 2020-07-27 ENCOUNTER — Encounter (HOSPITAL_COMMUNITY)
Admission: RE | Admit: 2020-07-27 | Discharge: 2020-07-27 | Disposition: A | Discharge status: Home / Self Care | Payer: 59 | Source: Ambulatory Visit | Attending: Urology | Admitting: Urology

## 2020-07-27 ENCOUNTER — Encounter (HOSPITAL_COMMUNITY): Payer: Self-pay

## 2020-07-27 DIAGNOSIS — Z01812 Encounter for preprocedural laboratory examination: Secondary | ICD-10-CM | POA: Insufficient documentation

## 2020-07-27 LAB — CBC
HCT: 39.6 % (ref 39.0–52.0)
Hemoglobin: 13.5 g/dL (ref 13.0–17.0)
MCH: 31.5 pg (ref 26.0–34.0)
MCHC: 34.1 g/dL (ref 30.0–36.0)
MCV: 92.3 fL (ref 80.0–100.0)
Platelets: 216 10*3/uL (ref 150–400)
RBC: 4.29 MIL/uL (ref 4.22–5.81)
RDW: 12.5 % (ref 11.5–15.5)
WBC: 5.9 10*3/uL (ref 4.0–10.5)
nRBC: 0 % (ref 0.0–0.2)

## 2020-07-27 NOTE — Progress Notes (Signed)
COVID Vaccine Completed:no Date COVID Vaccine completed: COVID vaccine manufacturer: Pfizer    Moderna   [employer]   PCP - Dr. Carlean Jews [place] Cardiologist - no  Chest x-ray - no EKG - 03/16/20 Stress Test - no ECHO - no Cardiac Cath - no Pacemaker/ICD device last checked:NA  Sleep Study - no CPAP -   Fasting Blood Sugar -NA  Checks Blood Sugar _____ times a day  Blood Thinner InstructionsNA: Aspirin Instructions: Last Dose:  Anesthesia review:   Patient denies shortness of breath, fever, cough and chest pain at PAT appointment  yes   Patient verbalized understanding of instructions that were given to them at the PAT appointment. Patient was also instructed that they will need to review over the PAT instructions again at home before surgery.  Yes   Pt is able to climb 3 flights of stairs and work in the yard as well as ADLs without SOB. He has has neck surgery and has some decrease of ROM mostly side to side. He has has Migraine headaches 3-4 times a week since childhood and take pain meds.

## 2020-07-29 ENCOUNTER — Other Ambulatory Visit (HOSPITAL_COMMUNITY)
Admission: RE | Admit: 2020-07-29 | Discharge: 2020-07-29 | Disposition: A | Discharge status: Home / Self Care | Payer: 59 | Source: Ambulatory Visit | Attending: Urology | Admitting: Urology

## 2020-07-29 DIAGNOSIS — Z01812 Encounter for preprocedural laboratory examination: Secondary | ICD-10-CM | POA: Diagnosis not present

## 2020-07-29 DIAGNOSIS — Z20822 Contact with and (suspected) exposure to covid-19: Secondary | ICD-10-CM | POA: Diagnosis not present

## 2020-07-29 LAB — SARS CORONAVIRUS 2 (TAT 6-24 HRS): SARS Coronavirus 2: NEGATIVE

## 2020-08-02 ENCOUNTER — Ambulatory Visit (HOSPITAL_COMMUNITY): Payer: 59 | Admitting: Anesthesiology

## 2020-08-02 ENCOUNTER — Encounter (HOSPITAL_COMMUNITY)
Admission: RE | Disposition: A | Discharge status: Home / Self Care | Payer: Self-pay | Source: Home / Self Care | Attending: Urology

## 2020-08-02 ENCOUNTER — Other Ambulatory Visit: Payer: Self-pay

## 2020-08-02 ENCOUNTER — Observation Stay (HOSPITAL_COMMUNITY)
Admission: RE | Admit: 2020-08-02 | Discharge: 2020-08-04 | Disposition: A | Discharge status: Home / Self Care | Payer: 59 | Attending: Urology | Admitting: Urology

## 2020-08-02 ENCOUNTER — Encounter (HOSPITAL_COMMUNITY): Payer: Self-pay | Admitting: Urology

## 2020-08-02 DIAGNOSIS — Z79899 Other long term (current) drug therapy: Secondary | ICD-10-CM | POA: Diagnosis not present

## 2020-08-02 DIAGNOSIS — C61 Malignant neoplasm of prostate: Secondary | ICD-10-CM | POA: Diagnosis not present

## 2020-08-02 DIAGNOSIS — K660 Peritoneal adhesions (postprocedural) (postinfection): Secondary | ICD-10-CM | POA: Diagnosis not present

## 2020-08-02 DIAGNOSIS — Z85038 Personal history of other malignant neoplasm of large intestine: Secondary | ICD-10-CM | POA: Insufficient documentation

## 2020-08-02 DIAGNOSIS — E039 Hypothyroidism, unspecified: Secondary | ICD-10-CM | POA: Diagnosis not present

## 2020-08-02 DIAGNOSIS — Z8673 Personal history of transient ischemic attack (TIA), and cerebral infarction without residual deficits: Secondary | ICD-10-CM | POA: Diagnosis not present

## 2020-08-02 HISTORY — PX: ROBOT ASSISTED LAPAROSCOPIC RADICAL PROSTATECTOMY: SHX5141

## 2020-08-02 HISTORY — PX: LYMPHADENECTOMY: SHX5960

## 2020-08-02 LAB — HEMOGLOBIN AND HEMATOCRIT, BLOOD
HCT: 36.9 % — ABNORMAL LOW (ref 39.0–52.0)
Hemoglobin: 12.4 g/dL — ABNORMAL LOW (ref 13.0–17.0)

## 2020-08-02 LAB — TYPE AND SCREEN
ABO/RH(D): O POS
Antibody Screen: NEGATIVE

## 2020-08-02 SURGERY — PROSTATECTOMY, RADICAL, ROBOT-ASSISTED, LAPAROSCOPIC
Anesthesia: General

## 2020-08-02 MED ORDER — ACETAMINOPHEN 500 MG PO TABS
1000.0000 mg | ORAL_TABLET | Freq: Four times a day (QID) | ORAL | Status: AC
  Administered 2020-08-03 (×3): 1000 mg via ORAL
  Filled 2020-08-02 (×3): qty 2

## 2020-08-02 MED ORDER — MIDAZOLAM HCL 2 MG/2ML IJ SOLN
INTRAMUSCULAR | Status: AC
  Filled 2020-08-02: qty 2

## 2020-08-02 MED ORDER — LACTATED RINGERS IR SOLN
Status: DC | PRN
  Administered 2020-08-02: 1000 mL

## 2020-08-02 MED ORDER — DEXTROSE-NACL 5-0.45 % IV SOLN: INTRAVENOUS | Status: DC

## 2020-08-02 MED ORDER — SODIUM CHLORIDE (PF) 0.9 % IJ SOLN
INTRAMUSCULAR | Status: AC
  Filled 2020-08-02: qty 20

## 2020-08-02 MED ORDER — LACTATED RINGERS IV SOLN: INTRAVENOUS | Status: DC

## 2020-08-02 MED ORDER — DIPHENHYDRAMINE HCL 12.5 MG/5ML PO ELIX: 12.5000 mg | ORAL_SOLUTION | Freq: Four times a day (QID) | ORAL | Status: DC | PRN

## 2020-08-02 MED ORDER — PIPERACILLIN-TAZOBACTAM 3.375 G IVPB
INTRAVENOUS | Status: AC
  Filled 2020-08-02: qty 50

## 2020-08-02 MED ORDER — PIPERACILLIN-TAZOBACTAM 3.375 G IVPB
3.3750 g | INTRAVENOUS | Status: AC
  Administered 2020-08-02: 3.375 g via INTRAVENOUS

## 2020-08-02 MED ORDER — DEXAMETHASONE SODIUM PHOSPHATE 10 MG/ML IJ SOLN
INTRAMUSCULAR | Status: AC
  Filled 2020-08-02: qty 1

## 2020-08-02 MED ORDER — MIDAZOLAM HCL 2 MG/2ML IJ SOLN
INTRAMUSCULAR | Status: DC | PRN
  Administered 2020-08-02: 2 mg via INTRAVENOUS

## 2020-08-02 MED ORDER — SODIUM CHLORIDE (PF) 0.9 % IJ SOLN
INTRAMUSCULAR | Status: AC
  Filled 2020-08-02: qty 10

## 2020-08-02 MED ORDER — CHLORHEXIDINE GLUCONATE 0.12 % MT SOLN
15.0000 mL | Freq: Once | OROMUCOSAL | Status: AC
  Administered 2020-08-02: 15 mL via OROMUCOSAL

## 2020-08-02 MED ORDER — SUFENTANIL CITRATE 50 MCG/ML IV SOLN
INTRAVENOUS | Status: AC
  Filled 2020-08-02: qty 1

## 2020-08-02 MED ORDER — PROPOFOL 10 MG/ML IV BOLUS
INTRAVENOUS | Status: AC
  Filled 2020-08-02: qty 20

## 2020-08-02 MED ORDER — SULFAMETHOXAZOLE-TRIMETHOPRIM 800-160 MG PO TABS: 1.0000 | ORAL_TABLET | Freq: Two times a day (BID) | ORAL | 0 refills | Status: DC

## 2020-08-02 MED ORDER — PHENYLEPHRINE 40 MCG/ML (10ML) SYRINGE FOR IV PUSH (FOR BLOOD PRESSURE SUPPORT)
PREFILLED_SYRINGE | INTRAVENOUS | Status: DC | PRN
  Administered 2020-08-02 (×3): 120 ug via INTRAVENOUS
  Administered 2020-08-02: 40 ug via INTRAVENOUS

## 2020-08-02 MED ORDER — HYDROMORPHONE HCL 1 MG/ML IJ SOLN
0.5000 mg | INTRAMUSCULAR | Status: DC | PRN
  Administered 2020-08-02 – 2020-08-04 (×8): 1 mg via INTRAVENOUS
  Filled 2020-08-02 (×8): qty 1

## 2020-08-02 MED ORDER — SUGAMMADEX SODIUM 200 MG/2ML IV SOLN
INTRAVENOUS | Status: DC | PRN
  Administered 2020-08-02: 250 mg via INTRAVENOUS

## 2020-08-02 MED ORDER — ONDANSETRON HCL 4 MG/2ML IJ SOLN
INTRAMUSCULAR | Status: DC | PRN
  Administered 2020-08-02: 4 mg via INTRAVENOUS

## 2020-08-02 MED ORDER — LIDOCAINE 2% (20 MG/ML) 5 ML SYRINGE
INTRAMUSCULAR | Status: AC
  Filled 2020-08-02: qty 5

## 2020-08-02 MED ORDER — HYDROMORPHONE HCL 1 MG/ML IJ SOLN
0.2500 mg | INTRAMUSCULAR | Status: DC | PRN
  Administered 2020-08-02: 0.5 mg via INTRAVENOUS

## 2020-08-02 MED ORDER — DEXAMETHASONE SODIUM PHOSPHATE 10 MG/ML IJ SOLN
INTRAMUSCULAR | Status: DC | PRN
  Administered 2020-08-02: 10 mg via INTRAVENOUS

## 2020-08-02 MED ORDER — BUPIVACAINE LIPOSOME 1.3 % IJ SUSP
20.0000 mL | Freq: Once | INTRAMUSCULAR | Status: AC
  Administered 2020-08-02: 20 mL
  Filled 2020-08-02: qty 20

## 2020-08-02 MED ORDER — DIPHENHYDRAMINE HCL 50 MG/ML IJ SOLN: 12.5000 mg | Freq: Four times a day (QID) | INTRAMUSCULAR | Status: DC | PRN

## 2020-08-02 MED ORDER — MAGNESIUM CITRATE PO SOLN: 1.0000 | Freq: Once | ORAL | Status: DC

## 2020-08-02 MED ORDER — HYDROCODONE-ACETAMINOPHEN 5-325 MG PO TABS: 1.0000 | ORAL_TABLET | Freq: Four times a day (QID) | ORAL | 0 refills | Status: AC | PRN

## 2020-08-02 MED ORDER — HYDROMORPHONE HCL 1 MG/ML IJ SOLN
INTRAMUSCULAR | Status: DC | PRN
  Administered 2020-08-02 (×4): .5 mg via INTRAVENOUS

## 2020-08-02 MED ORDER — STERILE WATER FOR INJECTION IJ SOLN
INTRAMUSCULAR | Status: DC | PRN
  Administered 2020-08-02: 1 mL via INTRAMUSCULAR

## 2020-08-02 MED ORDER — PROMETHAZINE HCL 25 MG/ML IJ SOLN
12.5000 mg | Freq: Once | INTRAMUSCULAR | Status: AC
  Administered 2020-08-02: 12.5 mg via INTRAVENOUS
  Filled 2020-08-02: qty 1

## 2020-08-02 MED ORDER — ONDANSETRON HCL 4 MG/2ML IJ SOLN
INTRAMUSCULAR | Status: AC
  Filled 2020-08-02: qty 2

## 2020-08-02 MED ORDER — SODIUM CHLORIDE 0.9 % IV BOLUS
1000.0000 mL | Freq: Once | INTRAVENOUS | Status: AC
  Administered 2020-08-02: 1000 mL via INTRAVENOUS

## 2020-08-02 MED ORDER — HYDROMORPHONE HCL 1 MG/ML IJ SOLN
INTRAMUSCULAR | Status: AC
Start: 2020-08-02 — End: 2020-08-03
  Filled 2020-08-02: qty 1

## 2020-08-02 MED ORDER — ROCURONIUM BROMIDE 10 MG/ML (PF) SYRINGE
PREFILLED_SYRINGE | INTRAVENOUS | Status: AC
  Filled 2020-08-02: qty 10

## 2020-08-02 MED ORDER — ACETAMINOPHEN 500 MG PO TABS
1000.0000 mg | ORAL_TABLET | Freq: Once | ORAL | Status: AC
  Administered 2020-08-02: 1000 mg via ORAL
  Filled 2020-08-02: qty 2

## 2020-08-02 MED ORDER — PANTOPRAZOLE SODIUM 40 MG PO TBEC
40.0000 mg | DELAYED_RELEASE_TABLET | Freq: Every day | ORAL | Status: DC
  Administered 2020-08-03 – 2020-08-04 (×2): 40 mg via ORAL
  Filled 2020-08-02 (×2): qty 1

## 2020-08-02 MED ORDER — BELLADONNA ALKALOIDS-OPIUM 16.2-60 MG RE SUPP: 1.0000 | Freq: Four times a day (QID) | RECTAL | Status: DC | PRN

## 2020-08-02 MED ORDER — PROPOFOL 10 MG/ML IV BOLUS
INTRAVENOUS | Status: DC | PRN
  Administered 2020-08-02: 150 mg via INTRAVENOUS

## 2020-08-02 MED ORDER — ORAL CARE MOUTH RINSE: 15.0000 mL | Freq: Once | OROMUCOSAL | Status: AC

## 2020-08-02 MED ORDER — HYDROMORPHONE HCL 2 MG/ML IJ SOLN
INTRAMUSCULAR | Status: AC
  Filled 2020-08-02: qty 1

## 2020-08-02 MED ORDER — LIDOCAINE 2% (20 MG/ML) 5 ML SYRINGE
INTRAMUSCULAR | Status: DC | PRN
  Administered 2020-08-02: 100 mg via INTRAVENOUS

## 2020-08-02 MED ORDER — STERILE WATER FOR IRRIGATION IR SOLN
Status: DC | PRN
  Administered 2020-08-02: 1000 mL

## 2020-08-02 MED ORDER — ROCURONIUM BROMIDE 10 MG/ML (PF) SYRINGE
PREFILLED_SYRINGE | INTRAVENOUS | Status: DC | PRN
  Administered 2020-08-02: 80 mg via INTRAVENOUS
  Administered 2020-08-02: 20 mg via INTRAVENOUS
  Administered 2020-08-02 (×2): 10 mg via INTRAVENOUS
  Administered 2020-08-02: 20 mg via INTRAVENOUS

## 2020-08-02 MED ORDER — SUFENTANIL CITRATE 50 MCG/ML IV SOLN
INTRAVENOUS | Status: DC | PRN
Start: 2020-08-02 — End: 2020-08-02
  Administered 2020-08-02 (×5): 10 ug via INTRAVENOUS

## 2020-08-02 MED ORDER — SODIUM CHLORIDE (PF) 0.9 % IJ SOLN
INTRAMUSCULAR | Status: DC | PRN
  Administered 2020-08-02: 20 mL

## 2020-08-02 MED ORDER — DOCUSATE SODIUM 100 MG PO CAPS
100.0000 mg | ORAL_CAPSULE | Freq: Two times a day (BID) | ORAL | Status: DC
  Administered 2020-08-02 – 2020-08-04 (×4): 100 mg via ORAL
  Filled 2020-08-02 (×4): qty 1

## 2020-08-02 MED ORDER — ONDANSETRON HCL 4 MG/2ML IJ SOLN
4.0000 mg | INTRAMUSCULAR | Status: DC | PRN
  Administered 2020-08-02 – 2020-08-04 (×2): 4 mg via INTRAVENOUS
  Filled 2020-08-02 (×2): qty 2

## 2020-08-02 MED ORDER — OXYCODONE HCL 5 MG PO TABS
5.0000 mg | ORAL_TABLET | ORAL | Status: DC | PRN
  Administered 2020-08-02 – 2020-08-03 (×2): 5 mg via ORAL
  Filled 2020-08-02 (×2): qty 1

## 2020-08-02 MED ORDER — PROMETHAZINE HCL 25 MG/ML IJ SOLN: 6.2500 mg | INTRAMUSCULAR | Status: DC | PRN

## 2020-08-02 MED ORDER — BACITRACIN-NEOMYCIN-POLYMYXIN 400-5-5000 EX OINT: 1.0000 "application " | TOPICAL_OINTMENT | Freq: Three times a day (TID) | CUTANEOUS | Status: DC | PRN

## 2020-08-02 SURGICAL SUPPLY — 73 items
ADH SKN CLS APL DERMABOND .7 (GAUZE/BANDAGES/DRESSINGS) ×2
APL PRP STRL LF DISP 70% ISPRP (MISCELLANEOUS) ×2
APL SWBSTK 6 STRL LF DISP (MISCELLANEOUS) ×2
APPLICATOR COTTON TIP 6 STRL (MISCELLANEOUS) ×2 IMPLANT
APPLICATOR COTTON TIP 6IN STRL (MISCELLANEOUS) ×4
CATH FOLEY 2WAY SLVR 18FR 30CC (CATHETERS) ×4 IMPLANT
CATH TIEMANN FOLEY 18FR 5CC (CATHETERS) ×4 IMPLANT
CHLORAPREP W/TINT 26 (MISCELLANEOUS) ×4 IMPLANT
CLIP VESOLOCK LG 6/CT PURPLE (CLIP) ×10 IMPLANT
CNTNR URN SCR LID CUP LEK RST (MISCELLANEOUS) ×2 IMPLANT
CONT SPEC 4OZ STRL OR WHT (MISCELLANEOUS) ×4
COVER SURGICAL LIGHT HANDLE (MISCELLANEOUS) ×4 IMPLANT
COVER TIP SHEARS 8 DVNC (MISCELLANEOUS) ×2 IMPLANT
COVER TIP SHEARS 8MM DA VINCI (MISCELLANEOUS) ×4
COVER WAND RF STERILE (DRAPES) IMPLANT
CUTTER ECHEON FLEX ENDO 45 340 (ENDOMECHANICALS) ×4 IMPLANT
DECANTER SPIKE VIAL GLASS SM (MISCELLANEOUS) ×4 IMPLANT
DERMABOND ADVANCED (GAUZE/BANDAGES/DRESSINGS) ×2
DERMABOND ADVANCED .7 DNX12 (GAUZE/BANDAGES/DRESSINGS) ×2 IMPLANT
DRAIN CHANNEL RND F F (WOUND CARE) IMPLANT
DRAPE ARM DVNC X/XI (DISPOSABLE) ×8 IMPLANT
DRAPE COLUMN DVNC XI (DISPOSABLE) ×2 IMPLANT
DRAPE DA VINCI XI ARM (DISPOSABLE) ×16
DRAPE DA VINCI XI COLUMN (DISPOSABLE) ×4
DRAPE SURG IRRIG POUCH 19X23 (DRAPES) ×4 IMPLANT
DRSG TEGADERM 4X4.75 (GAUZE/BANDAGES/DRESSINGS) ×4 IMPLANT
ELECT REM PT RETURN 15FT ADLT (MISCELLANEOUS) ×4 IMPLANT
GAUZE 4X4 16PLY RFD (DISPOSABLE) IMPLANT
GAUZE SPONGE 2X2 8PLY STRL LF (GAUZE/BANDAGES/DRESSINGS) IMPLANT
GLOVE BIO SURGEON STRL SZ 6.5 (GLOVE) ×3 IMPLANT
GLOVE BIO SURGEONS STRL SZ 6.5 (GLOVE) ×1
GLOVE BIOGEL M STRL SZ7.5 (GLOVE) ×8 IMPLANT
GLOVE BIOGEL PI IND STRL 7.5 (GLOVE) ×2 IMPLANT
GLOVE BIOGEL PI INDICATOR 7.5 (GLOVE) ×2
GOWN STRL REUS W/TWL LRG LVL3 (GOWN DISPOSABLE) ×12 IMPLANT
HOLDER FOLEY CATH W/STRAP (MISCELLANEOUS) ×4 IMPLANT
IRRIG SUCT STRYKERFLOW 2 WTIP (MISCELLANEOUS) ×4
IRRIGATION SUCT STRKRFLW 2 WTP (MISCELLANEOUS) ×2 IMPLANT
IV LACTATED RINGERS 1000ML (IV SOLUTION) ×4 IMPLANT
KIT PROCEDURE DA VINCI SI (MISCELLANEOUS) ×4
KIT PROCEDURE DVNC SI (MISCELLANEOUS) ×2 IMPLANT
KIT TURNOVER KIT A (KITS) IMPLANT
NDL INSUFFLATION 14GA 120MM (NEEDLE) ×2 IMPLANT
NDL SPNL 22GX7 QUINCKE BK (NEEDLE) ×2 IMPLANT
NEEDLE INSUFFLATION 14GA 120MM (NEEDLE) ×4 IMPLANT
NEEDLE SPNL 22GX7 QUINCKE BK (NEEDLE) ×4 IMPLANT
PACK ROBOT UROLOGY CUSTOM (CUSTOM PROCEDURE TRAY) ×4 IMPLANT
PAD POSITIONING PINK XL (MISCELLANEOUS) ×4 IMPLANT
PENCIL SMOKE EVACUATOR (MISCELLANEOUS) IMPLANT
PORT ACCESS TROCAR AIRSEAL 12 (TROCAR) ×2 IMPLANT
PORT ACCESS TROCAR AIRSEAL 5M (TROCAR) ×2
RELOAD STAPLE 45 4.1 GRN THCK (STAPLE) ×2 IMPLANT
SCISSORS LAP 5X45 EPIX DISP (ENDOMECHANICALS) ×2 IMPLANT
SEAL CANN UNIV 5-8 DVNC XI (MISCELLANEOUS) ×8 IMPLANT
SEAL XI 5MM-8MM UNIVERSAL (MISCELLANEOUS) ×16
SET TRI-LUMEN FLTR TB AIRSEAL (TUBING) ×4 IMPLANT
SOLUTION ELECTROLUBE (MISCELLANEOUS) ×4 IMPLANT
SPONGE GAUZE 2X2 STER 10/PKG (GAUZE/BANDAGES/DRESSINGS)
SPONGE LAP 4X18 RFD (DISPOSABLE) ×4 IMPLANT
STAPLE RELOAD 45 GRN (STAPLE) ×2 IMPLANT
STAPLE RELOAD 45MM GREEN (STAPLE) ×4
SUT ETHILON 3 0 PS 1 (SUTURE) ×4 IMPLANT
SUT MNCRL AB 4-0 PS2 18 (SUTURE) ×8 IMPLANT
SUT PDS AB 1 CT1 27 (SUTURE) ×8 IMPLANT
SUT VIC AB 2-0 SH 27 (SUTURE) ×4
SUT VIC AB 2-0 SH 27X BRD (SUTURE) ×2 IMPLANT
SUT VICRYL 0 UR6 27IN ABS (SUTURE) ×4 IMPLANT
SUT VLOC BARB 180 ABS3/0GR12 (SUTURE) ×12
SUTURE VLOC BRB 180 ABS3/0GR12 (SUTURE) ×6 IMPLANT
SYR 27GX1/2 1ML LL SAFETY (SYRINGE) ×4 IMPLANT
TOWEL OR NON WOVEN STRL DISP B (DISPOSABLE) ×4 IMPLANT
TROCAR XCEL NON-BLD 5MMX100MML (ENDOMECHANICALS) ×2 IMPLANT
WATER STERILE IRR 1000ML POUR (IV SOLUTION) ×4 IMPLANT

## 2020-08-02 NOTE — H&P (Signed)
Eduardo [name] is an 74 y.o. male.    Chief Complaint: Pre-OP Prostatectomy  HPI:   1- Moderate Risk Prostate Cancer - 8/12 core up to 70% grade 3 cancer by BX 02/2020 on eval PSA 5.6 by [facility]t in [place]. Bilateral apical and lateral positivity. CT and BS clinically localized. TRUS 51mL, no median lobe. Met with rad-onc 05/2020.   2 - Nephrolithiasis - incidental bilateral punctate renal stones 66mm x 2 each side on stagign CT 02/2020.   PMH sig for Rt hemicolectomy for cancer (excision, revision, then ventral hernia repair, all open), Bilateral lap inguinal hernia repari with mesh. No ischemic CV disease / blood thinners. HE is retired from [employer]. His PCP is Dr. Gerarda Fraction.   Today " Eduardo [name] " is seen to proceed with prostatectomy / node dissection. NO interval fevers.   Past Medical History:  Diagnosis Date  . Chronic pain syndrome   . Colon cancer ([place]) 10/20/13   colon resection-   . DDD (degenerative disc disease), cervical   . GERD (gastroesophageal reflux disease)   . History of kidney stones    seen on imaging but never knowingly passed spontaneously. Never had any manipulation surgery/procedure for it.   Marland Kitchen History of seasonal allergies   . Kidney stone   . Kidney stones   . Migraine    "maybe 1-2/month" since childhood  . Neuropathy 2010   bi lat feet  . Primary osteoarthritis   . Prostate cancer ([place])   . Skin cancer of face    "froze them off"  . Stroke [facility])    pt denies not confirmed  . Wears glasses     Past Surgical History:  Procedure Laterality Date  . ANTERIOR CERVICAL DECOMP/DISCECTOMY FUSION N/A 09/26/2017   Procedure: ACDF - C3-C4 - C5-C6 - C6-C7;  Surgeon: Earnie Larsson, MD;  Location: [facility];  Service: Neurosurgery;  Laterality: N/A;  . APPENDECTOMY  10/20/13   (with right hemicolectomy)  . CARPAL TUNNEL RELEASE Left 2011  . CARPAL TUNNEL RELEASE Right 01/07/2013   Procedure: CARPAL TUNNEL RELEASE;  Surgeon: Tennis Must, MD;   Location: [place];  Service: Orthopedics;  Laterality: Right;  . COLON RESECTION N/A 10/20/2013   Procedure:  LAPAROSCOPIC hand assisted partial colectomy;  Surgeon: Jamesetta So, MD;  [facility]: [facility];  Service: General;  Laterality: N/A;  . COLON SURGERY  2014   x2- 2nd. to correct a perforation , treated with TPN & drain & then later had to remove more colon   . COLONOSCOPY    . COLONOSCOPY N/A 10/18/2013   Procedure: COLONOSCOPY;  Surgeon: Danie Binder, MD;  [facility];  Service: Endoscopy;  Laterality: N/A;  2:00-moved to 1030 Pam to notify pt  . COLONOSCOPY N/A 09/22/2014   Procedure: COLONOSCOPY;  Surgeon: Rogene Houston, MD;  [facility];  Service: Endoscopy;  Laterality: N/A;  1200  . COLONOSCOPY N/A 11/20/2017   Procedure: COLONOSCOPY;  Surgeon: Rogene Houston, MD;  [facility];  Service: Endoscopy;  Laterality: N/A;  955  . ESOPHAGOGASTRODUODENOSCOPY N/A 06/19/2016   Procedure: ESOPHAGOGASTRODUODENOSCOPY (EGD);  Surgeon: Rogene Houston, MD;  [facility];  Service: Endoscopy;  Laterality: N/A;  3:00  . EVALUATION UNDER ANESTHESIA WITH HEMORRHOIDECTOMY N/A 08/06/2018   Procedure: Roy;  Surgeon: Erroll Luna, MD;  Location: [employer] [facility];  Service: General;  Laterality: N/A;  . EXCISIONAL HEMORRHOIDECTOMY    .  INCISIONAL HERNIA REPAIR  06/20/2015   WITH MYOFASCIAL ADVANCEMENT FLAP AND MESH   . INCISIONAL HERNIA REPAIR N/A 06/20/2015   Procedure: INCISIONAL HERNIA REPAIR WITH MYOFASCIAL ADVANCEMENT FLAP AND MESH;  Surgeon: Erroll Luna, MD;  Location: [place];  Service: General;  Laterality: N/A;  . INGUINAL HERNIA REPAIR Left    "@ [facility]"  . INGUINAL HERNIA REPAIR Bilateral 2000   "[place]"  . INSERTION OF MESH N/A 06/20/2015   Procedure: INSERTION OF MESH;  Surgeon: Erroll Luna, MD;  Location: [place];  Service: General;  Laterality: N/A;   . KNEE ARTHROSCOPY Bilateral 2001  . NASAL SEPTUM SURGERY  1980's  . TAKE DOWN OF INTESTINAL FISTULA N/A 02/02/2014   Procedure: TAKE DOWN OF ENTEROCUTANEOUS  FISTULA;  Surgeon: Marcello Moores A. Cornett, MD;  Location: [facility];  Service: General;  Laterality: N/A;  . TONSILLECTOMY    . UPPER GI ENDOSCOPY      Family History  Problem Relation Age of Onset  . Colon polyps Father   . Colon cancer Father   . Colon polyps Paternal Uncle   . Colon polyps Maternal Grandfather   . Cancer Maternal Grandfather   . Breast cancer Mother   . Ovarian cancer Mother   . Kidney cancer Mother   . Cancer Maternal Uncle   . Prostate cancer Neg Hx    Social History:  reports that he has never smoked. He has never used smokeless tobacco. He reports that he does not drink alcohol and does not use drugs.  Allergies:  Allergies  Allergen Reactions  . Niaspan [Niacin Er] Rash  . Tape Rash and Other (See Comments)    Paper tape OK    No medications prior to admission.    No results found for this or any previous visit (from the past 48 hour(s)). No results found.  Review of Systems  Constitutional: Negative for chills and fever.  All other systems reviewed and are negative.   There were no vitals taken for this visit. Physical Exam Vitals reviewed.  HENT:     Head: Normocephalic.     Nose: Nose normal.  Cardiovascular:     Rate and Rhythm: Normal rate.  Pulmonary:     Effort: Pulmonary effort is normal.  Abdominal:     General: Abdomen is flat.     Comments: Multiple scars w/o hernias.   Musculoskeletal:        General: Normal range of motion.     Cervical back: Normal range of motion.  Skin:    General: Skin is warm.  Neurological:     General: No focal deficit present.     Mental Status: He is alert.  Psychiatric:        Mood and Affect: Mood normal.      Assessment/Plan  Proceed as planned with robotic prostatectomy / node dissection. RIsks, benefits, alternatives, expected  peri-op course discussed previously and reiterated today. He understnds that his extensive prior surgical history placed him at risk of ALL intra-operative complications.   Alexis Frock, MD 08/02/2020, 7:13 AM

## 2020-08-02 NOTE — Brief Op Note (Signed)
08/02/2020  3:55 PM  PATIENT:  Eduardo [name]  74 y.o. male  PRE-OPERATIVE DIAGNOSIS:  PROSTATE CANCER  POST-OPERATIVE DIAGNOSIS:  PROSTATE CANCER  PROCEDURE:  Procedure(s) with comments: XI ROBOTIC ASSISTED LAPAROSCOPIC RADICAL PROSTATECTOMY, EXTENSIVE LYSIS OF ADHESIONS (N/A) - 3 HRS LYMPHADENECTOMY (Bilateral)  SURGEON:  Surgeon(s) and Role:    * Alexis Frock, MD - Primary  PHYSICIAN ASSISTANT:   ASSISTANTS: Debbrah Alar PA   ANESTHESIA:   local and general  EBL:  250 mL   BLOOD ADMINISTERED:none  DRAINS: 1 - JP to bulb; 2 - FOley to gravity   LOCAL MEDICATIONS USED:  MARCAINE     SPECIMEN:  Source of Specimen:  1 - pelvic lymph nodes  2 - prostatectomy  DISPOSITION OF SPECIMEN:  PATHOLOGY  COUNTS:  YES  TOURNIQUET:  * No tourniquets in log *  DICTATION: .Other Dictation: Dictation Number  [id number]  PLAN OF CARE: Admit for overnight observation  PATIENT DISPOSITION:  PACU - hemodynamically stable.   Delay start of Pharmacological VTE agent (>24hrs) due to surgical blood loss or risk of bleeding: not applicable

## 2020-08-02 NOTE — Plan of Care (Signed)
  Problem: Education: Goal: Knowledge of the procedure and recovery process will improve Outcome: Progressing   Problem: Bowel/Gastric: Goal: Gastrointestinal status for postoperative course will improve Outcome: Progressing   Problem: Pain Management: Goal: General experience of comfort will improve Outcome: Progressing   Problem: Skin Integrity: Goal: Demonstration of wound healing without infection will improve Outcome: Progressing   Problem: Urinary Elimination: Goal: Ability to avoid or minimize complications of infection will improve Outcome: Progressing Goal: Ability to achieve and maintain urine output will improve Outcome: Progressing Goal: Home care management will improve Outcome: Progressing

## 2020-08-02 NOTE — Anesthesia Procedure Notes (Signed)
Procedure Name: Intubation Date/Time: 08/02/2020 12:12 PM Performed by: Sharlette Dense, CRNA Patient Re-evaluated:Patient Re-evaluated prior to induction Oxygen Delivery Method: Circle system utilized Preoxygenation: Pre-oxygenation with 100% oxygen Induction Type: IV induction Ventilation: Mask ventilation without difficulty and Oral airway inserted - appropriate to patient size Laryngoscope Size: Miller and 3 Grade View: Grade I Tube type: Oral Tube size: 8.0 mm Number of attempts: 1 Airway Equipment and Method: Stylet Placement Confirmation: ETT inserted through vocal cords under direct vision,  positive ETCO2 and breath sounds checked- equal and bilateral Secured at: 22 cm Tube secured with: Tape Dental Injury: Teeth and Oropharynx as per pre-operative assessment

## 2020-08-02 NOTE — Discharge Instructions (Signed)

## 2020-08-02 NOTE — Transfer of Care (Signed)
Immediate Anesthesia Transfer of Care Note  Patient: Eduardo [name]  Procedure(s) Performed: XI ROBOTIC ASSISTED LAPAROSCOPIC RADICAL PROSTATECTOMY, EXTENSIVE LYSIS OF ADHESIONS (N/A ) LYMPHADENECTOMY (Bilateral )  Patient Location: PACU  Anesthesia Type:General  Level of Consciousness: drowsy  Airway & Oxygen Therapy: Patient Spontanous Breathing and Patient connected to face mask oxygen  Post-op Assessment: Report given to RN and Post -op Vital signs reviewed and stable  Post vital signs: Reviewed and stable  Last Vitals:  Vitals Value Taken Time  BP 138/83 08/02/20 1615  Temp    Pulse 94 08/02/20 1616  Resp 12 08/02/20 1616  SpO2 100 % 08/02/20 1616  Vitals shown include unvalidated device data.  Last Pain:  Vitals:   08/02/20 1023  TempSrc: Oral         Complications: No complications documented.

## 2020-08-02 NOTE — Anesthesia Preprocedure Evaluation (Addendum)
Anesthesia Evaluation  Patient identified by MRN, date of birth, ID band  Airway Mallampati: II  TM Distance: >3 FB Neck ROM: Full    Dental no notable dental hx.    Pulmonary neg pulmonary ROS,    Pulmonary exam normal breath sounds clear to auscultation       Cardiovascular Exercise Tolerance: Good  Rhythm:Regular Rate:Normal  Sinus rhythm Left anterior fascicular block   Neuro/Psych  Headaches, CVA negative psych ROS   GI/Hepatic GERD  ,  Endo/Other  Hypothyroidism   Renal/GU Renal disease (kidney stones)     Musculoskeletal  (+) Arthritis , Osteoarthritis,    Abdominal Normal abdominal exam  (+)   Peds negative pediatric ROS (+)  Hematology negative hematology ROS (+)   Anesthesia Other Findings Chronic pain H/o colon cancer Prostate cancer  Reproductive/Obstetrics negative OB ROS                            Anesthesia Physical Anesthesia Plan  ASA: III  Anesthesia Plan: General   Post-op Pain Management:    Induction: Intravenous  PONV Risk Score and Plan: 2 and Dexamethasone, Ondansetron and Midazolam  Airway Management Planned: Oral ETT  Additional Equipment: None  Intra-op Plan:   Post-operative Plan: Extubation in OR  Informed Consent: I have reviewed the patients History and Physical, chart, labs and discussed the procedure including the risks, benefits and alternatives for the proposed anesthesia with the patient or authorized representative who has indicated his/her understanding and acceptance.     Dental advisory given  Plan Discussed with: CRNA and Anesthesiologist  Anesthesia Plan Comments:        Anesthesia Quick Evaluation

## 2020-08-03 ENCOUNTER — Encounter (HOSPITAL_COMMUNITY): Payer: Self-pay | Admitting: Urology

## 2020-08-03 DIAGNOSIS — C61 Malignant neoplasm of prostate: Secondary | ICD-10-CM | POA: Diagnosis not present

## 2020-08-03 LAB — HEMOGLOBIN AND HEMATOCRIT, BLOOD
HCT: 33.2 % — ABNORMAL LOW (ref 39.0–52.0)
Hemoglobin: 11.4 g/dL — ABNORMAL LOW (ref 13.0–17.0)

## 2020-08-03 LAB — BASIC METABOLIC PANEL
Anion gap: 10 (ref 5–15)
BUN: 14 mg/dL (ref 8–23)
CO2: 23 mmol/L (ref 22–32)
Calcium: 8.5 mg/dL — ABNORMAL LOW (ref 8.9–10.3)
Chloride: 107 mmol/L (ref 98–111)
Creatinine, Ser: 1.12 mg/dL (ref 0.61–1.24)
GFR, Estimated: >60 mL/min (ref >60)
Glucose, Bld: 129 mg/dL — ABNORMAL HIGH (ref 70–99)
Potassium: 3.8 mmol/L (ref 3.5–5.1)
Sodium: 140 mmol/L (ref 135–145)

## 2020-08-03 MED ORDER — ACETAMINOPHEN 325 MG PO TABS
650.0000 mg | ORAL_TABLET | ORAL | Status: DC | PRN
  Administered 2020-08-03: 650 mg via ORAL
  Filled 2020-08-03: qty 2

## 2020-08-03 MED ORDER — CHLORHEXIDINE GLUCONATE CLOTH 2 % EX PADS
6.0000 | MEDICATED_PAD | Freq: Every day | CUTANEOUS | Status: DC
  Administered 2020-08-03 – 2020-08-04 (×2): 6 via TOPICAL

## 2020-08-03 NOTE — Plan of Care (Signed)
  Problem: Bowel/Gastric: Goal: Gastrointestinal status for postoperative course will improve Outcome: Progressing   Problem: Pain Management: Goal: General experience of comfort will improve Outcome: Progressing   Problem: Skin Integrity: Goal: Demonstration of wound healing without infection will improve Outcome: Progressing   Problem: Urinary Elimination: Goal: Ability to avoid or minimize complications of infection will improve Outcome: Progressing Goal: Ability to achieve and maintain urine output will improve Outcome: Progressing Goal: Home care management will improve Outcome: Progressing   Problem: Health Behavior/Discharge Planning: Goal: Ability to manage health-related needs will improve Outcome: Progressing   Problem: Clinical Measurements: Goal: Ability to maintain clinical measurements within normal limits will improve Outcome: Progressing Goal: Will remain free from infection Outcome: Progressing Goal: Diagnostic test results will improve Outcome: Progressing Goal: Respiratory complications will improve Outcome: Progressing Goal: Cardiovascular complication will be avoided Outcome: Progressing   Problem: Nutrition: Goal: Adequate nutrition will be maintained Outcome: Progressing   Problem: Coping: Goal: Level of anxiety will decrease Outcome: Progressing   Problem: Elimination: Goal: Will not experience complications related to bowel motility Outcome: Progressing Goal: Will not experience complications related to urinary retention Outcome: Progressing   Problem: Pain Managment: Goal: General experience of comfort will improve Outcome: Progressing   Problem: Safety: Goal: Ability to remain free from injury will improve Outcome: Progressing   Problem: Skin Integrity: Goal: Risk for impaired skin integrity will decrease Outcome: Progressing

## 2020-08-03 NOTE — Progress Notes (Signed)
1 Day Post-Op Subjective: The patient is doing well.  No nausea or vomiting with CLD. No flatus. Ambulated yesterday. Pain is adequately controlled.  Objective: Vital signs in last 24 hours: Temp:  [97.7 F (36.5 C)-98.5 F (36.9 C)] 98.5 F (36.9 C) (10/14 0552) Pulse Rate:  [67-98] 90 (10/14 0552) Resp:  [10-20] 20 (10/14 0552) BP: (114-161)/(73-95) 135/77 (10/14 0552) SpO2:  [91 %-100 %] 93 % (10/14 0552) Weight:  [100.2 kg] 100.2 kg (10/14 0731)  Intake/Output from previous day: 10/13 0701 - 10/14 0700 In: 3106.2 [I.V.:3056.2; IV Piggyback:50] Out: 1000 [Urine:675; Drains:75; Blood:250] Intake/Output this shift: No intake/output data recorded.  Physical Exam:  General: Alert and oriented. CV: Regular rate Lungs: NWOB on RA GI: Soft, Nondistended, appropriately tender Incisions: Clean, dry, and intact. JP drain with minimal blood output  GU: foley in place draining pink-tinged urine Extremities: Nontender  Lab Results: Recent Labs    08/02/20 1630 08/03/20 0432  HGB 12.4* 11.4*  HCT 36.9* 33.2*      Assessment/Plan: POD# 1 s/p extensive lysis of adhesions and robotic radical prostatectomy with bilateral lymph node dissection.  1) Very gradually advance diet at tolerated. Stop IVF 2) Ambulate, incentive spirometry 3) Transition to oral pain medication 4) D/C pelvic drain prior to discharge if output remains low 5) Given extensive lysis of adhesions, there is a low threshold to keep patient for observation today if any nausea. EDD later today versus tomorrow    LOS: 0 days   Celene Squibb 08/03/2020, 7:42 AM

## 2020-08-03 NOTE — Anesthesia Postprocedure Evaluation (Signed)
Anesthesia Post Note  Patient: Om Lizotte Ange  Procedure(s) Performed: XI ROBOTIC ASSISTED LAPAROSCOPIC RADICAL PROSTATECTOMY, EXTENSIVE LYSIS OF ADHESIONS (N/A ) LYMPHADENECTOMY (Bilateral )     Patient location during evaluation: PACU Anesthesia Type: General Level of consciousness: awake and alert Pain management: pain level controlled Vital Signs Assessment: post-procedure vital signs reviewed and stable Respiratory status: spontaneous breathing, nonlabored ventilation and respiratory function stable Cardiovascular status: blood pressure returned to baseline and stable Postop Assessment: no apparent nausea or vomiting Anesthetic complications: no   No complications documented.  Last Vitals:  Vitals:   08/03/20 0144 08/03/20 0552  BP: 114/73 135/77  Pulse: 88 90  Resp:  20  Temp: 36.9 C 36.9 C  SpO2: 96% 93%    Last Pain:  Vitals:   08/03/20 0922  TempSrc:   PainSc: 4                  Lidia Collum

## 2020-08-03 NOTE — Op Note (Signed)
NAME: Eduardo [name], Eduardo [name] MEDICAL RECORD [id number] ACCOUNT [id number] DATE OF BIRTH:Dec 19, 1945 FACILITY: [facility] LOCATION: [id number] PHYSICIAN:Koleman Marling, MD  OPERATIVE REPORT  DATE OF PROCEDURE:  08/02/2020  SURGEON:  Alexis Frock, MD  PREOPERATIVE DIAGNOSIS:  Unfavorable moderate risk prostate cancer.  PROCEDURE: 1.  Laparoscopic adhesiolysis, extensive. 2.  Robotic-assisted laparoscopic radical prostatectomy with bilateral pelvic lymphadenectomy and sentinel lymphangiography.  ESTIMATED BLOOD LOSS:  100 mL.  COMPLICATIONS:  None.  SPECIMENS: 1.  Right external iliac lymph nodes. 2.  Right obturator lymph nodes. 3.  Left external iliac lymph nodes. 4.  Left obturator lymph nodes. 5.  Periprosthetic fat. 6.  Radical prostatectomy, all for permanent pathology.  ASSISTANT:  Debbrah Alar, PA  DRAINS:   1.  Jackson-Pratt drain to bulb suction. 2.  Foley catheter to straight drain.  FINDINGS: 1.  Extensive adhesions in all quadrants of the abdomen. 2.  Evidence of prior ventral and bilateral inguinal mesh hernia repair. 3.  Bilateral sentinel lymph nodes denoted as such on pathology requisition.  INDICATIONS:  The patient is a very pleasant 74 year old man who was found on workup of elevated PSA to have a fairly large volume unfavorable moderate risk adenocarcinoma of the prostate.  Staging bone scan and CT revealed evidence of distant disease.   Options were discussed for management including surveillance protocols versus ablative therapy versus surgical extirpation and he adamantly wished to proceed with prostatectomy with curative intent.  We discussed beforehand that his extensive  intraabdominal surgery history does make him high risk of all complications and I specifically counseled him to consider radiotherapy; however, he adamantly wished to proceed with surgery.  I detailed that he was a suitable candidate for this.  Informed  consent was obtained and placed in the  medical record.  DESCRIPTION OF PROCEDURE:  The patient being identified, the procedure being radical prostatectomy was confirmed.  Procedure timeout was performed.  Intravenous antibiotics administered.  General endotracheal anesthesia induced.  The patient was placed  into a low lithotomy position.  Sterile field was created, prepping  and draping the patient's penis, perineum and proximal thighs using iodine and his infra-xiphoid abdomen using chlorhexidine gluconate after clipper shaving and further fastening him  into the operative table using 3-inch tape over foam padding across the supraxiphoid chest.  Test for steep Trendelenburg positioning was performed and found to be suitably positioned.  Next, a high-flow, low-pressure pneumoperitoneum was obtained using  Veress technique in the left upper quadrant, having passed the aspiration and drop test.  This location was chosen after extensive review of his preoperative imaging as this appeared to be the area for this to have any suspect adhesions.  Next, an 8 mm  robotic camera port was placed into position approximately 4 fingerbreadths lateral to the umbilicus.  Again, this  port was chosen after extensive review of the imaging.  This revealed extensive adhesions as expected in all quadrants of the abdomen.   Some loose omental, others containing loops of fairly adhesed small bowel.  Using very careful fluoroscopic vision, a right far lateral 12 mm AirSeal assist port was placed in the left far lateral 8 mm robotic port was placed.  Next, 75 minutes of  extensive and very careful adhesiolysis was carefully performed using combination of cold scissors and cautery scissors, releasing multiple dense adhesions to the anterior abdominal wall.  Most of these were omental in nature; however, several especially  in the right hemi-abdomen were quite dense and containing loops of small bowel.  Exquisite  care was taken to avoid any bowel injury and this did not  occur grossly.  Having freed up the infraumbilical abdomen from adhesions, additional right paramedian 8  mm robotic port, right paramedian 5 mm suction port were placed.  Robot was docked.  Attention was directed at the prostatectomy portion.  The space of Retzius was developed by incising lateral to the medial umbilical ligament from the midline towards  the internal ring bilaterally, dissecting the bladder off the anterior abdominal wall.  There were additional adhesions non-bile containing in the area of prior inguinal hernia repair.  There was mesh in place and this was purposely left in situ.   Dissection exposed the anterior base of the prostate, which was defatted to better denote bladder neck prostate junction.  This tissue was set aside labeled as periprosthetic fat.  Next, 0.2 mL of indocyanine green dye was injected in each lobe of the  prostate using a percutaneously placed robotically-guided spinal needle with intervening suctioning to prevent dye spillage, which did not occur.  Next, the endopelvic fascia was swept away from the pelvic sidewall in a base to apex orientation of the  prostate.  This exposed the dorsal venous complex.  This was controlled using a green load stapler, taking exquisite care to avoid membranous urethral injury which again did not occur.  Then, approximately 10 minutes post-dye injection, the pelvis was  inspected under near infrared fluorescence light.  Sentinel lymphangiography did reveal one periprostatic sentinel lymph node on the right side and then a dominant sentinel lymphatic channel on the left coursing towards the obturator lymph nodes.  As such, on the right side, standard template  lymphadenectomy was performed, first of the right external iliac group with boundaries being right external iliac artery, vein, pelvic sidewall, iliac bifurcation.  Lymphostasis was achieved with cold clips, set aside labeled right external iliac lymph  nodes.  Next, the right  obturator group was dissected free with the boundaries being right external iliac vein, obturator nerve, pelvic sidewall.  Lymphostasis was achieved with cold clips, set aside labeled right obturator lymph nodes.  Again, there was  a dominant sentinel lymph node just lateral to the prostate, bladder neck area and this was set aside labeled right perivesical lymph node, sentinel.  A mirror image lymphadenectomy was performed on the left external and left obturator group  respectively.  The obturator group again had a dominant sentinel lymph node within it that had been noted as such.  Bilateral obturator nerves were then inspected and found to be uninjured.  Attention was directed at bladder neck dissection.  A lateral  release was performed on each side to better denote the bladder neck, prostate caliber and the bladder neck and prostate were separated in an anterior to posterior direction, keeping what appeared to be a rim of circular muscle fibers in each plane of  dissection.  I was quite pleased with the bladder neck caliber and had preserved the majority of it.  Next, posterior dissection was performed by incising approximately 7 mm inferoposterior to the posterior lip of the prostate, entering the plane of  Denonvilliers.  Bilateral vas segments were encountered, dissected for a distance of approximately 3 cm, ligated and placed on gentle superior traction.  Bilateral seminal vesicles were dissected to their tip and placed on gentle superior traction.  The  seminal vesicles were quite large.  Dissection then proceeded posteriorly within the plane Denonvilliers towards the area of the apex of the prostate.  This exposed  the vascular pedicles on each side, which were controlled using a sequential clipping  technique in a base to apex orientation, performing a purposely wide dissection given the patient's significant lateral disease positivity.  Final apical dissection was performed in the anterior plane,  placing the prostate on superior traction,  transecting the membranous urethra coldly.  This completely freed up the prostatectomy specimen.  It was placed in EndoCatch bag for later retrieval.  Digital rectal exam was then performed under laparoscopic vision using indicator glove and no evidence  of rectal violation was noted.  Posterior dissection was performed using a 3-0 V-Loc suture, reapproximating the posterior urethral plate to the posterior bladder neck, bringing these structures into tension free apposition.  Next, bladder to membranous  urethra mucosal anastomosis was performed using double arm 3-0 V-Loc suture from the 6 o'clock to 12 o'clock position, resulting in excellent tension-free anastomosis.  A new Foley catheter was then placed per urethra, which irrigated quantitatively.   Hemostasis was excellent.  Sponge and needle counts were correct.  A closed suction drain was brought out the previous left lateral most robotic port site into the peritoneal cavity.  The previous right lateral most assistant port site was closed at the fascia  using the Carter-Thomason suture passer and 0 Vicryl.  The abdomen was once again very carefully inspected.  There is no evidence of any bowel injury or succus.  Additional tension was applied to the areas of adhesiolysis, which were all mostly in the  supraumbilical abdomen.  At this point, specimen was then retrieved by extending the previous camera port site inferiorly for a distance of approximately 3.5 cm, removing the prostatectomy specimen, setting it aside for permanent pathology.  The  extraction site was closed with fascia using figure-of-eight Novafil x3, followed by reapproximation of Scarpa's with running Vicryl.  All incision sites were infiltrated with dilute lipolyzed Marcaine and closed at the level of the skin using  subcuticular Monocryl followed by Dermabond.  The procedure was then terminated.  The patient tolerated the procedure well with  no immediate perioperative complications.  The patient was taken to postanesthesia care unit in stable condition.  Plan for observation admission, likely discharge home tomorrow versus the following day.  Please note, first assistant Debbrah Alar was crucial for all portions of the surgery today.  She provided invaluable retraction, suctioning, specimen manipulation, vascular stapling, lymphatic clipping, and general first assistance.  VN/NUANCE  D:08/02/2020 T:08/02/2020 JOB:[id number]

## 2020-08-03 NOTE — Plan of Care (Signed)
  Problem: Pain Management: Goal: General experience of comfort will improve Outcome: Progressing   Problem: Skin Integrity: Goal: Demonstration of wound healing without infection will improve Outcome: Progressing   Problem: Urinary Elimination: Goal: Home care management will improve Outcome: Progressing   Problem: Education: Goal: Knowledge of the procedure and recovery process will improve Outcome: Completed/Met   Problem: Education: Goal: Knowledge of General Education information will improve Description: Including pain rating scale, medication(s)/side effects and non-pharmacologic comfort measures Outcome: Completed/Met   Problem: Activity: Goal: Risk for activity intolerance will decrease Outcome: Completed/Met

## 2020-08-03 NOTE — Plan of Care (Signed)
  Problem: Pain Management: Goal: General experience of comfort will improve Outcome: Progressing   Problem: Skin Integrity: Goal: Demonstration of wound healing without infection will improve Outcome: Progressing   Problem: Urinary Elimination: Goal: Home care management will improve Outcome: Progressing   Problem: Education: Goal: Knowledge of the procedure and recovery process will improve Outcome: Completed/Met   Problem: Education: Goal: Knowledge of the procedure and recovery process will improve Outcome: Completed/Met

## 2020-08-04 DIAGNOSIS — C61 Malignant neoplasm of prostate: Secondary | ICD-10-CM | POA: Diagnosis not present

## 2020-08-04 NOTE — Discharge Summary (Addendum)
Date of admission: 08/02/2020  Date of discharge: 08/04/2020  Admission diagnosis: Prostate cancer  Discharge diagnosis: Prostate cancer  Secondary diagnoses: Extensive abdominal adhesions  History and Physical: For full details, please see admission history and physical. Briefly, Eduardo [name] is a 74 y.o. patient with moderate risk prostate cancer.   Hospital Course:  Patient underwent robotic radical prostatectomy and bilateral pelvic lymph node dissection with extensive lysis of adhesions from prior abdominal surgeries on 08/02/20. He tolerated the procedure well and was transferred to the floor after receiving routine post-operative care. His diet was gradually advanced to regular, and his pain was controlled with analgesics.  By POD2, he was tolerating a regular diet, having bowel movements, ambulating without difficulty, and having pain controlled with oral analgesics. He was deemed appropriate for discharge home. His JP drain output remained low during hospital stay, and it was removed prior to discharge. His foley catheter was continued.   Laboratory values: Recent Labs    08/02/20 1630 08/03/20 0432  HGB 12.4* 11.4*  HCT 36.9* 33.2*   Recent Labs    08/03/20 0432  CREATININE 1.12    Disposition: Home  Discharge instruction: The patient was instructed to be ambulatory but told to refrain from heavy lifting, strenuous activity, or driving.   Discharge medications:  Allergies as of 08/04/2020       Reactions   Niaspan [niacin Er] Rash   Tape Rash, Other (See Comments)   Paper tape OK        Medication List     STOP taking these medications    cyanocobalamin 1000 MCG/ML injection Commonly known as: (VITAMIN B-12)       TAKE these medications    acetaminophen 325 MG tablet Commonly known as: TYLENOL Take 2 tablets (650 mg total) by mouth every 6 (six) hours as needed for mild pain or headache (fever >/= 101).   clotrimazole-betamethasone  cream Commonly known as: LOTRISONE APPLY EXTERNALLY TO THE AFFECTED AND SURROUNDING AREAS TWICE DAILY IN THE MORNING AND IN THE EVENING   EPINEPHrine 0.3 mg/0.3 mL Soaj injection Commonly known as: EPI-PEN Inject 0.3 mLs (0.3 mg total) into the muscle once.   fluticasone 50 MCG/ACT nasal spray Commonly known as: FLONASE Place 2 sprays into both nostrils daily as needed for allergies.   HYDROcodone-acetaminophen 5-325 MG tablet Commonly known as: NORCO/VICODIN Take 1-2 tablets by mouth every 6 (six) hours as needed for moderate pain or severe pain. What changed:  how much to take reasons to take this   ondansetron 8 MG disintegrating tablet Commonly known as: Zofran ODT Take 1 tablet (8 mg total) by mouth every 8 (eight) hours as needed for nausea or vomiting.   pantoprazole 40 MG tablet Commonly known as: Protonix Take 1 tablet (40 mg total) by mouth daily.   ranitidine 300 MG tablet Commonly known as: ZANTAC Take 300 mg by mouth daily as needed for heartburn.   rizatriptan 10 MG tablet Commonly known as: MAXALT Take 10 mg by mouth as needed for migraine. May repeat in 2 hours if needed   sulfamethoxazole-trimethoprim 800-160 MG tablet Commonly known as: BACTRIM DS Take 1 tablet by mouth 2 (two) times daily. Start the day prior to foley removal appointment   topiramate 50 MG tablet Commonly known as: Topamax Take 1 tablet (50 mg total) by mouth 2 (two) times daily. Start 1 tablet daily x 1 week and then twice daily        Followup:   Follow-up Information  Alexis Frock, MD On 08/14/2020.   Specialty: Urology Why: at 9:15 AM for MD visit, pathology review, and catheter removal.  Contact information: [place] [id number] [phone]                I have seen and examinded the patient on the day of discharge and agree he is meeting all discharge criteria. He is ambulatory, pain controlled on PO meds, maintianing PO hydration, and  felt to be adequtae for discharge. Precuations discussed.

## 2020-08-04 NOTE — Progress Notes (Signed)
AVS given to pt and demonstrated how to chage Foely bag to leg bag. Verbalized understanding. Pt familiar with Foley cath. Wife at bedside.

## 2020-08-09 LAB — SURGICAL PATHOLOGY

## 2020-08-11 ENCOUNTER — Encounter: Payer: Self-pay | Admitting: Medical Oncology

## 2020-09-06 DIAGNOSIS — M62838 Other muscle spasm: Secondary | ICD-10-CM | POA: Diagnosis not present

## 2020-09-06 DIAGNOSIS — N393 Stress incontinence (female) (male): Secondary | ICD-10-CM | POA: Diagnosis not present

## 2020-09-06 DIAGNOSIS — M6281 Muscle weakness (generalized): Secondary | ICD-10-CM | POA: Diagnosis not present

## 2020-09-06 DIAGNOSIS — M6289 Other specified disorders of muscle: Secondary | ICD-10-CM | POA: Diagnosis not present

## 2020-09-27 DIAGNOSIS — M62838 Other muscle spasm: Secondary | ICD-10-CM | POA: Diagnosis not present

## 2020-09-27 DIAGNOSIS — M6281 Muscle weakness (generalized): Secondary | ICD-10-CM | POA: Diagnosis not present

## 2020-09-27 DIAGNOSIS — N393 Stress incontinence (female) (male): Secondary | ICD-10-CM | POA: Diagnosis not present

## 2020-10-23 DIAGNOSIS — E538 Deficiency of other specified B group vitamins: Secondary | ICD-10-CM | POA: Diagnosis not present

## 2020-10-23 DIAGNOSIS — G894 Chronic pain syndrome: Secondary | ICD-10-CM | POA: Diagnosis not present

## 2020-10-23 DIAGNOSIS — Z6829 Body mass index (BMI) 29.0-29.9, adult: Secondary | ICD-10-CM | POA: Diagnosis not present

## 2020-10-23 DIAGNOSIS — E663 Overweight: Secondary | ICD-10-CM | POA: Diagnosis not present

## 2020-10-23 DIAGNOSIS — C61 Malignant neoplasm of prostate: Secondary | ICD-10-CM | POA: Diagnosis not present

## 2020-10-23 DIAGNOSIS — Z1389 Encounter for screening for other disorder: Secondary | ICD-10-CM | POA: Diagnosis not present

## 2020-11-13 DIAGNOSIS — C61 Malignant neoplasm of prostate: Secondary | ICD-10-CM | POA: Diagnosis not present

## 2020-11-13 DIAGNOSIS — N5231 Erectile dysfunction following radical prostatectomy: Secondary | ICD-10-CM | POA: Diagnosis not present

## 2020-11-13 DIAGNOSIS — N393 Stress incontinence (female) (male): Secondary | ICD-10-CM | POA: Diagnosis not present

## 2020-11-23 DIAGNOSIS — M1991 Primary osteoarthritis, unspecified site: Secondary | ICD-10-CM | POA: Diagnosis not present

## 2020-11-23 DIAGNOSIS — Z6829 Body mass index (BMI) 29.0-29.9, adult: Secondary | ICD-10-CM | POA: Diagnosis not present

## 2020-11-23 DIAGNOSIS — E538 Deficiency of other specified B group vitamins: Secondary | ICD-10-CM | POA: Diagnosis not present

## 2020-11-23 DIAGNOSIS — G894 Chronic pain syndrome: Secondary | ICD-10-CM | POA: Diagnosis not present

## 2020-12-20 DIAGNOSIS — G894 Chronic pain syndrome: Secondary | ICD-10-CM | POA: Diagnosis not present

## 2020-12-20 DIAGNOSIS — M1991 Primary osteoarthritis, unspecified site: Secondary | ICD-10-CM | POA: Diagnosis not present

## 2020-12-20 DIAGNOSIS — E538 Deficiency of other specified B group vitamins: Secondary | ICD-10-CM | POA: Diagnosis not present

## 2020-12-20 DIAGNOSIS — Z6829 Body mass index (BMI) 29.0-29.9, adult: Secondary | ICD-10-CM | POA: Diagnosis not present

## 2020-12-20 DIAGNOSIS — C61 Malignant neoplasm of prostate: Secondary | ICD-10-CM | POA: Diagnosis not present

## 2021-01-19 DIAGNOSIS — Z6830 Body mass index (BMI) 30.0-30.9, adult: Secondary | ICD-10-CM | POA: Diagnosis not present

## 2021-01-19 DIAGNOSIS — E6609 Other obesity due to excess calories: Secondary | ICD-10-CM | POA: Diagnosis not present

## 2021-01-19 DIAGNOSIS — D510 Vitamin B12 deficiency anemia due to intrinsic factor deficiency: Secondary | ICD-10-CM | POA: Diagnosis not present

## 2021-01-19 DIAGNOSIS — G894 Chronic pain syndrome: Secondary | ICD-10-CM | POA: Diagnosis not present

## 2021-01-19 DIAGNOSIS — G609 Hereditary and idiopathic neuropathy, unspecified: Secondary | ICD-10-CM | POA: Diagnosis not present

## 2021-01-19 DIAGNOSIS — E538 Deficiency of other specified B group vitamins: Secondary | ICD-10-CM | POA: Diagnosis not present

## 2021-01-19 DIAGNOSIS — M1991 Primary osteoarthritis, unspecified site: Secondary | ICD-10-CM | POA: Diagnosis not present

## 2021-02-19 DIAGNOSIS — E663 Overweight: Secondary | ICD-10-CM | POA: Diagnosis not present

## 2021-02-19 DIAGNOSIS — Z6829 Body mass index (BMI) 29.0-29.9, adult: Secondary | ICD-10-CM | POA: Diagnosis not present

## 2021-02-19 DIAGNOSIS — G894 Chronic pain syndrome: Secondary | ICD-10-CM | POA: Diagnosis not present

## 2021-02-19 DIAGNOSIS — Z23 Encounter for immunization: Secondary | ICD-10-CM | POA: Diagnosis not present

## 2021-02-19 DIAGNOSIS — E538 Deficiency of other specified B group vitamins: Secondary | ICD-10-CM | POA: Diagnosis not present

## 2021-03-22 DIAGNOSIS — E538 Deficiency of other specified B group vitamins: Secondary | ICD-10-CM | POA: Diagnosis not present

## 2021-03-22 DIAGNOSIS — J302 Other seasonal allergic rhinitis: Secondary | ICD-10-CM | POA: Diagnosis not present

## 2021-03-22 DIAGNOSIS — G894 Chronic pain syndrome: Secondary | ICD-10-CM | POA: Diagnosis not present

## 2021-03-22 DIAGNOSIS — E663 Overweight: Secondary | ICD-10-CM | POA: Diagnosis not present

## 2021-03-22 DIAGNOSIS — Z6828 Body mass index (BMI) 28.0-28.9, adult: Secondary | ICD-10-CM | POA: Diagnosis not present

## 2021-04-24 DIAGNOSIS — Z6828 Body mass index (BMI) 28.0-28.9, adult: Secondary | ICD-10-CM | POA: Diagnosis not present

## 2021-04-24 DIAGNOSIS — M1991 Primary osteoarthritis, unspecified site: Secondary | ICD-10-CM | POA: Diagnosis not present

## 2021-04-24 DIAGNOSIS — E538 Deficiency of other specified B group vitamins: Secondary | ICD-10-CM | POA: Diagnosis not present

## 2021-04-24 DIAGNOSIS — E663 Overweight: Secondary | ICD-10-CM | POA: Diagnosis not present

## 2021-04-24 DIAGNOSIS — G894 Chronic pain syndrome: Secondary | ICD-10-CM | POA: Diagnosis not present

## 2021-05-24 DIAGNOSIS — G894 Chronic pain syndrome: Secondary | ICD-10-CM | POA: Diagnosis not present

## 2021-05-24 DIAGNOSIS — M1991 Primary osteoarthritis, unspecified site: Secondary | ICD-10-CM | POA: Diagnosis not present

## 2021-05-24 DIAGNOSIS — Z6828 Body mass index (BMI) 28.0-28.9, adult: Secondary | ICD-10-CM | POA: Diagnosis not present

## 2021-05-24 DIAGNOSIS — E663 Overweight: Secondary | ICD-10-CM | POA: Diagnosis not present

## 2021-06-26 DIAGNOSIS — G894 Chronic pain syndrome: Secondary | ICD-10-CM | POA: Diagnosis not present

## 2021-06-26 DIAGNOSIS — D510 Vitamin B12 deficiency anemia due to intrinsic factor deficiency: Secondary | ICD-10-CM | POA: Diagnosis not present

## 2021-06-26 DIAGNOSIS — Z681 Body mass index (BMI) 19 or less, adult: Secondary | ICD-10-CM | POA: Diagnosis not present

## 2021-06-26 DIAGNOSIS — M1991 Primary osteoarthritis, unspecified site: Secondary | ICD-10-CM | POA: Diagnosis not present

## 2021-11-26 DIAGNOSIS — Z0000 Encounter for general adult medical examination without abnormal findings: Secondary | ICD-10-CM | POA: Diagnosis not present

## 2021-11-26 DIAGNOSIS — G894 Chronic pain syndrome: Secondary | ICD-10-CM | POA: Diagnosis not present

## 2021-11-26 DIAGNOSIS — M1991 Primary osteoarthritis, unspecified site: Secondary | ICD-10-CM | POA: Diagnosis not present

## 2021-11-26 DIAGNOSIS — D510 Vitamin B12 deficiency anemia due to intrinsic factor deficiency: Secondary | ICD-10-CM | POA: Diagnosis not present

## 2021-11-26 DIAGNOSIS — E663 Overweight: Secondary | ICD-10-CM | POA: Diagnosis not present

## 2021-11-26 DIAGNOSIS — Z1331 Encounter for screening for depression: Secondary | ICD-10-CM | POA: Diagnosis not present

## 2021-11-26 DIAGNOSIS — G609 Hereditary and idiopathic neuropathy, unspecified: Secondary | ICD-10-CM | POA: Diagnosis not present

## 2021-11-26 DIAGNOSIS — Z6828 Body mass index (BMI) 28.0-28.9, adult: Secondary | ICD-10-CM | POA: Diagnosis not present

## 2021-12-24 DIAGNOSIS — D510 Vitamin B12 deficiency anemia due to intrinsic factor deficiency: Secondary | ICD-10-CM | POA: Diagnosis not present

## 2021-12-24 DIAGNOSIS — E663 Overweight: Secondary | ICD-10-CM | POA: Diagnosis not present

## 2021-12-24 DIAGNOSIS — M1991 Primary osteoarthritis, unspecified site: Secondary | ICD-10-CM | POA: Diagnosis not present

## 2021-12-24 DIAGNOSIS — G894 Chronic pain syndrome: Secondary | ICD-10-CM | POA: Diagnosis not present

## 2021-12-24 DIAGNOSIS — Z6827 Body mass index (BMI) 27.0-27.9, adult: Secondary | ICD-10-CM | POA: Diagnosis not present

## 2022-01-24 DIAGNOSIS — D510 Vitamin B12 deficiency anemia due to intrinsic factor deficiency: Secondary | ICD-10-CM | POA: Diagnosis not present

## 2022-01-24 DIAGNOSIS — J302 Other seasonal allergic rhinitis: Secondary | ICD-10-CM | POA: Diagnosis not present

## 2022-01-24 DIAGNOSIS — Z6828 Body mass index (BMI) 28.0-28.9, adult: Secondary | ICD-10-CM | POA: Diagnosis not present

## 2022-01-24 DIAGNOSIS — G894 Chronic pain syndrome: Secondary | ICD-10-CM | POA: Diagnosis not present

## 2022-02-21 DIAGNOSIS — L570 Actinic keratosis: Secondary | ICD-10-CM | POA: Diagnosis not present

## 2022-02-21 DIAGNOSIS — G894 Chronic pain syndrome: Secondary | ICD-10-CM | POA: Diagnosis not present

## 2022-02-21 DIAGNOSIS — E663 Overweight: Secondary | ICD-10-CM | POA: Diagnosis not present

## 2022-02-21 DIAGNOSIS — Z6828 Body mass index (BMI) 28.0-28.9, adult: Secondary | ICD-10-CM | POA: Diagnosis not present

## 2022-02-21 DIAGNOSIS — D510 Vitamin B12 deficiency anemia due to intrinsic factor deficiency: Secondary | ICD-10-CM | POA: Diagnosis not present

## 2022-03-11 DIAGNOSIS — X32XXXA Exposure to sunlight, initial encounter: Secondary | ICD-10-CM | POA: Diagnosis not present

## 2022-03-11 DIAGNOSIS — D485 Neoplasm of uncertain behavior of skin: Secondary | ICD-10-CM | POA: Diagnosis not present

## 2022-03-11 DIAGNOSIS — L570 Actinic keratosis: Secondary | ICD-10-CM | POA: Diagnosis not present

## 2022-03-11 DIAGNOSIS — D2262 Melanocytic nevi of left upper limb, including shoulder: Secondary | ICD-10-CM | POA: Diagnosis not present

## 2022-04-05 DIAGNOSIS — K222 Esophageal obstruction: Secondary | ICD-10-CM | POA: Diagnosis not present

## 2022-04-05 DIAGNOSIS — K224 Dyskinesia of esophagus: Secondary | ICD-10-CM | POA: Diagnosis not present

## 2022-04-05 DIAGNOSIS — R1310 Dysphagia, unspecified: Secondary | ICD-10-CM | POA: Diagnosis not present

## 2022-04-18 ENCOUNTER — Encounter (INDEPENDENT_AMBULATORY_CARE_PROVIDER_SITE_OTHER): Payer: Self-pay | Admitting: *Deleted

## 2022-05-08 DIAGNOSIS — H25811 Combined forms of age-related cataract, right eye: Secondary | ICD-10-CM | POA: Diagnosis not present

## 2022-05-08 DIAGNOSIS — H2511 Age-related nuclear cataract, right eye: Secondary | ICD-10-CM | POA: Diagnosis not present

## 2022-05-08 DIAGNOSIS — H25812 Combined forms of age-related cataract, left eye: Secondary | ICD-10-CM | POA: Diagnosis not present

## 2022-05-15 DIAGNOSIS — H25812 Combined forms of age-related cataract, left eye: Secondary | ICD-10-CM | POA: Diagnosis not present

## 2022-05-15 DIAGNOSIS — H2512 Age-related nuclear cataract, left eye: Secondary | ICD-10-CM | POA: Diagnosis not present

## 2022-06-17 ENCOUNTER — Other Ambulatory Visit (INDEPENDENT_AMBULATORY_CARE_PROVIDER_SITE_OTHER): Payer: Self-pay

## 2022-06-17 ENCOUNTER — Encounter (INDEPENDENT_AMBULATORY_CARE_PROVIDER_SITE_OTHER): Payer: Self-pay | Admitting: Gastroenterology

## 2022-06-17 ENCOUNTER — Encounter (INDEPENDENT_AMBULATORY_CARE_PROVIDER_SITE_OTHER): Payer: Self-pay

## 2022-06-17 ENCOUNTER — Ambulatory Visit (INDEPENDENT_AMBULATORY_CARE_PROVIDER_SITE_OTHER): Payer: 59 | Admitting: Gastroenterology

## 2022-06-17 VITALS — BP 134/88 | HR 72 | Temp 97.9°F | Ht 74.0 in | Wt 213.9 lb

## 2022-06-17 DIAGNOSIS — R1310 Dysphagia, unspecified: Secondary | ICD-10-CM

## 2022-06-17 DIAGNOSIS — R1013 Epigastric pain: Secondary | ICD-10-CM | POA: Insufficient documentation

## 2022-06-17 DIAGNOSIS — R6881 Early satiety: Secondary | ICD-10-CM

## 2022-06-17 MED ORDER — OMEPRAZOLE 40 MG PO CPDR: 40.0000 mg | DELAYED_RELEASE_CAPSULE | Freq: Every day | ORAL | 1 refills | Status: AC

## 2022-06-17 MED ORDER — SUCRALFATE 1 GM/10ML PO SUSP: 1.0000 g | Freq: Three times a day (TID) | ORAL | 1 refills | Status: DC

## 2022-06-17 NOTE — H&P (View-Only) (Signed)
Referring Provider: Redmond School, MD Primary Care Physician:  Redmond School, MD Primary GI Physician: new  Chief Complaint  Patient presents with   Dysphagia    New patient. Referred for dysphagia. Now also having stomach issues. Gets full quick. Cannot get meat to stay down.    HPI:   Eduardo [name] is a 76 y.o. male with past medical history of chronic pain, colon cancer s/p colon resection in 2014, DDD, GERD, kidney stones, neuropathy, osteoarthritis, prostate cancer, skin cancer  Patient presenting today as a new patient for dysphagia.     Most recent labs feb 2023 with normal LFTs, renal function. Sodium 143, K+3.6, hgb 13.5, plt 301k  Patient reports initial complaints of dysphagia where food felt stuck in mid chest to upper abdomen. States this has been ongoing for "a long time" but worse over the past 6-8 months and only able to tolerate softer foods. No issues with liquids of pills. Has to avoid meat altogether. Started having severe pain/burning in epigastric area that is constant. Eating makes pain much worse, only able to tolerate boost shakes at this time because of the pain he is having. Also noting early satiety when he was able to eat. Denies nausea or vomiting. Denies weight loss. Denies rectal bleeding or melena. Is currently only on zantac, taking this up to QID. This calms down his pain some but does not stop the pain. Denies acid regurgitation. No radiation of pain, no changes in medications or new supplements prior to pain starting. He does not drink alcohol.   NSAID use: using aleve occasionally, last dose was 3 weeks ago Social hx: no etoh or tobacco  Fam hx: father had colon cancer, as did grandfather and uncle  Last Colonoscopy:jan 2019 - The examined portion of the ileum was normal. - Patent end-to-side ileo-colonic anastomosis, characterized by healthy appearing mucosa. - The rectum, recto-sigmoid colon, sigmoid colon, descending colon, splenic  flexure, transverse colon and hepatic flexure are normal. - Internal hemorrhoids. - No specimens collected. Last Endoscopy:05/2016- Normal esophagus. - Non-severe reflux esophagitis limited to GE junction. - 2 cm hiatal hernia. - Gastritis. Biopsied- neg h pylori - Normal duodenal bulb and second portion of the duodenum.  Recommendations:  Repeat colonoscopy in Jan 2024  Past Medical History:  Diagnosis Date   Chronic pain syndrome    Colon cancer ([place]) 10/20/13   colon resection-    DDD (degenerative disc disease), cervical    GERD (gastroesophageal reflux disease)    History of kidney stones    seen on imaging but never knowingly passed spontaneously. Never had any manipulation surgery/procedure for it.    History of seasonal allergies    Kidney stone    Kidney stones    Migraine    "maybe 1-2/month" since childhood   Neuropathy 2010   bi lat feet   Primary osteoarthritis    Prostate cancer ([place])    Skin cancer of face    "froze them off"   Stroke [facility])    pt denies not confirmed   Wears glasses     Past Surgical History:  Procedure Laterality Date   ANTERIOR CERVICAL DECOMP/DISCECTOMY FUSION N/A 09/26/2017   Procedure: ACDF - C3-C4 - C5-C6 - C6-C7;  Surgeon: Earnie Larsson, MD;  Location: [place];  Service: Neurosurgery;  Laterality: N/A;   APPENDECTOMY  10/20/13   (with right hemicolectomy)   CARPAL TUNNEL RELEASE Left 2011   CARPAL TUNNEL RELEASE Right 01/07/2013   Procedure: CARPAL TUNNEL RELEASE;  Surgeon: Tennis Must, MD;  Location: [place];  Service: Orthopedics;  Laterality: Right;   COLON RESECTION N/A 10/20/2013   Procedure:  LAPAROSCOPIC hand assisted partial colectomy;  Surgeon: Jamesetta So, MD;  [facility];  Service: General;  Laterality: N/A;   COLON SURGERY  2014   x2- 2nd. to correct a perforation , treated with TPN & drain & then later had to remove more colon    COLONOSCOPY     COLONOSCOPY N/A 10/18/2013   Procedure:  COLONOSCOPY;  Surgeon: Danie Binder, MD;  [facility];  Service: Endoscopy;  Laterality: N/A;  2:00-moved to 1030 Pam to notify pt   COLONOSCOPY N/A 09/22/2014   Procedure: COLONOSCOPY;  Surgeon: Rogene [place], MD;  [facility];  Service: Endoscopy;  Laterality: N/A;  1200   COLONOSCOPY N/A 11/20/2017   Procedure: COLONOSCOPY;  Surgeon: Rogene [place], MD;  [facility];  Service: Endoscopy;  Laterality: N/A;  955   ESOPHAGOGASTRODUODENOSCOPY N/A 06/19/2016   Procedure: ESOPHAGOGASTRODUODENOSCOPY (EGD);  Surgeon: Rogene [place], MD;  [facility];  Service: Endoscopy;  Laterality: N/A;  3:00   EVALUATION UNDER ANESTHESIA WITH HEMORRHOIDECTOMY N/A 08/06/2018   Procedure: EXAM UNDER ANESTHESIA WITHHEMORRHOIDECTOMY ERAS PATHWAY;  Surgeon: Erroll Luna, MD;  Location: [place];  Service: General;  Laterality: N/A;   EXCISIONAL HEMORRHOIDECTOMY     INCISIONAL HERNIA REPAIR  06/20/2015   WITH MYOFASCIAL ADVANCEMENT FLAP AND MESH    INCISIONAL HERNIA REPAIR N/A 06/20/2015   Procedure: INCISIONAL HERNIA REPAIR WITH MYOFASCIAL ADVANCEMENT FLAP AND MESH;  Surgeon: Erroll Luna, MD;  Location: [place];  Service: General;  Laterality: N/A;   INGUINAL HERNIA REPAIR Left    "@ [facility]"   INGUINAL HERNIA REPAIR Bilateral 2000   "[place]"   INSERTION OF MESH N/A 06/20/2015   Procedure: INSERTION OF MESH;  Surgeon: Erroll Luna, MD;  Location: [place];  Service: General;  Laterality: N/A;   KNEE ARTHROSCOPY Bilateral 2001   LYMPHADENECTOMY Bilateral 08/02/2020   Procedure: LYMPHADENECTOMY;  Surgeon: Alexis Frock, MD;  [facility];  Service: Urology;  Laterality: Bilateral;   NASAL SEPTUM SURGERY  1980's   ROBOT ASSISTED LAPAROSCOPIC RADICAL PROSTATECTOMY N/A 08/02/2020   Procedure: XI ROBOTIC ASSISTED LAPAROSCOPIC RADICAL PROSTATECTOMY, EXTENSIVE LYSIS OF ADHESIONS;  Surgeon: Alexis Frock, MD;  [facility];  Service:  Urology;  Laterality: N/A;  3 HRS   TAKE DOWN OF INTESTINAL FISTULA N/A 02/02/2014   Procedure: TAKE DOWN OF ENTEROCUTANEOUS  FISTULA;  Surgeon: Marcello Moores A. Cornett, MD;  Location: [place];  Service: General;  Laterality: N/A;   TONSILLECTOMY     UPPER GI ENDOSCOPY      Current Outpatient Medications  Medication Sig Dispense Refill   clotrimazole-betamethasone (LOTRISONE) cream APPLY EXTERNALLY TO THE AFFECTED AND SURROUNDING AREAS TWICE DAILY IN THE MORNING AND IN THE EVENING     EPINEPHrine 0.3 mg/0.3 mL IJ SOAJ injection Inject 0.3 mLs (0.3 mg total) into the muscle once. 1 Device 0   fluticasone (FLONASE) 50 MCG/ACT nasal spray Place 2 sprays into both nostrils daily as needed for allergies.      HYDROcodone-acetaminophen (NORCO/VICODIN) 5-325 MG tablet Take 1-2 tablets by mouth every 6 (six) hours as needed for moderate pain or severe pain. 20 tablet 0   OVER THE COUNTER MEDICATION Probiotic     ranitidine (ZANTAC) 300 MG tablet Take 300 mg by mouth daily as needed for heartburn.  rizatriptan (MAXALT) 10 MG tablet Take 10 mg by mouth as needed for migraine. May repeat in 2 hours if needed     No current facility-administered medications for this visit.    Allergies as of 06/17/2022 - Review Complete 06/17/2022  Allergen Reaction Noted   Niaspan [niacin er] Rash 10/07/2013   Tape Rash and Other (See Comments) 03/04/2014    Family History  Problem Relation Age of Onset   Colon polyps Father    Colon cancer Father    Colon polyps Paternal Uncle    Colon polyps Maternal Grandfather    Cancer Maternal Grandfather    Breast cancer Mother    Ovarian cancer Mother    Kidney cancer Mother    Cancer Maternal Uncle    Prostate cancer Neg Hx     Social History   Socioeconomic History   Marital status: Married    Spouse name: Not on file   Number of children: 1   Years of education: Not on file   Highest education level: Not on file  Occupational History    Comment: retired   Tobacco Use   Smoking status: Never    Passive exposure: Never   Smokeless tobacco: Never  Vaping Use   Vaping Use: Never used  Substance and Sexual Activity   Alcohol use: No   Drug use: No   Sexual activity: Not Currently    Birth control/protection: None  Other Topics Concern   Not on file  Social History Narrative   Not on file   Social Determinants of Health   Financial Resource Strain: Not on file  Food Insecurity: Not on file  Transportation Needs: Not on file  Physical Activity: Not on file  Stress: Not on file  Social Connections: Not on file   Review of systems General: negative for malaise, night sweats, fever, chills, weight loss Neck: Negative for lumps, goiter, pain and significant neck swelling Resp: Negative for cough, wheezing, dyspnea at rest CV: Negative for chest pain, leg swelling, palpitations, orthopnea GI: denies melena, hematochezia, nausea, vomiting, diarrhea, constipation, odynophagia, or unintentional weight loss. +dysphagia +early satiety +epigastric pain/burning MSK: Negative for joint pain or swelling, back pain, and muscle pain. Derm: Negative for itching or rash Psych: Denies depression, anxiety, memory loss, confusion. No homicidal or suicidal ideation.  Heme: Negative for prolonged bleeding, bruising easily, and swollen nodes. Endocrine: Negative for cold or heat intolerance, polyuria, polydipsia and goiter. Neuro: negative for tremor, gait imbalance, syncope and seizures. The remainder of the review of systems is noncontributory.  Physical Exam: BP 134/88 (BP Location: Left Arm, Patient Position: Sitting, Cuff Size: Large)   Pulse 72   Temp 97.9 F (36.6 C) (Oral)   Ht '6\' 2"'$  (1.88 m)   Wt 213 lb 14.4 oz (97 kg)   BMI 27.46 kg/m  General:   Alert and oriented. No distress noted. Pleasant and cooperative.  Head:  Normocephalic and atraumatic. Eyes:  Conjuctiva clear without scleral icterus. Mouth:  Oral mucosa pink and moist. Good  dentition. No lesions. Heart: Normal rate and rhythm, s1 and s2 heart sounds present.  Lungs: Clear lung sounds in all lobes. Respirations equal and unlabored. Abdomen:  +BS, soft, and non-distended. TTP of epigastric region. No rebound or guarding. No HSM or masses noted. Derm: No palmar erythema or jaundice Msk:  Symmetrical without gross deformities. Normal posture. Extremities:  Without edema. Neurologic:  Alert and  oriented x4 Psych:  Alert and cooperative. Normal mood and affect.  Invalid  input(s): "6 MONTHS"   ASSESSMENT: ELIGH RYBACKI is a 76 y.o. male presenting today as a new patient with dysphagia and severe epigastric pain/burning.   Dysphagia for the past few years though worse over the past 6-8 months, no issues with pills or liquids, was able to eat soft foods but now really can only tolerate liquids. He is also now having severe epigastric pain/burning that began about 1 month ago, worsened by eating. Denies nausea or vomiting. No heavy /frequent NSAID use or ETOH. Hx of reflux esophagitis. Only taking zantact up to QID now with some improvement of pain.  No rectal bleeding or melena. No weight loss. Recommend proceeding with EGD for further evaluation of his symptoms as we cannot rule out esophagitis, esophageal ring, web, stricture or stenosis in regards to dysphagia and in regards to epigastric pain cannot rule out gastritis, PUD, or duodenitis, ultimately cannot rule out presence of malignancy in regards to both dysphagia and epigastric pain. Will start omeprazole '40mg'$  daily and carafate 1g QID. Should avoid all NSAIDs.  Hx of colon cancer, last TCS in 2019 with recommendation to repeat in 5 years. No changes in bowel habits, rectal bleeding or melena. Will plan repeat TCS in January 2024.   Indications, risks and benefits of procedure discussed in detail with patient. Patient verbalized understanding and is in agreement to proceed with EGD at this time.   PLAN:  Rx  omeprazole '40mg'$  once daily 2. Carafate 1g QID 3. Schedule EGD +/- dilation, ASA II 4. Avoid all NSAIDs  All questions were answered, patient verbalized understanding and is in agreement with plan as outlined above.    Follow Up: 3 months   Zyara Riling L. Alver Sorrow, MSN, APRN, AGNP-C Adult-Gerontology Nurse Practitioner [facility]

## 2022-06-17 NOTE — Progress Notes (Signed)
Referring Provider: Redmond School, MD Primary Care Physician:  Redmond School, MD Primary GI Physician: new  Chief Complaint  Patient presents with   Dysphagia    New patient. Referred for dysphagia. Now also having stomach issues. Gets full quick. Cannot get meat to stay down.    HPI:   Eduardo [name] is a 76 y.o. male with past medical history of chronic pain, colon cancer s/p colon resection in 2014, DDD, GERD, kidney stones, neuropathy, osteoarthritis, prostate cancer, skin cancer  Patient presenting today as a new patient for dysphagia.     Most recent labs feb 2023 with normal LFTs, renal function. Sodium 143, K+3.6, hgb 13.5, plt 301k  Patient reports initial complaints of dysphagia where food felt stuck in mid chest to upper abdomen. States this has been ongoing for "a long time" but worse over the past 6-8 months and only able to tolerate softer foods. No issues with liquids of pills. Has to avoid meat altogether. Started having severe pain/burning in epigastric area that is constant. Eating makes pain much worse, only able to tolerate boost shakes at this time because of the pain he is having. Also noting early satiety when he was able to eat. Denies nausea or vomiting. Denies weight loss. Denies rectal bleeding or melena. Is currently only on zantac, taking this up to QID. This calms down his pain some but does not stop the pain. Denies acid regurgitation. No radiation of pain, no changes in medications or new supplements prior to pain starting. He does not drink alcohol.   NSAID use: using aleve occasionally, last dose was 3 weeks ago Social hx: no etoh or tobacco  Fam hx: father had colon cancer, as did grandfather and uncle  Last Colonoscopy:jan 2019 - The examined portion of the ileum was normal. - Patent end-to-side ileo-colonic anastomosis, characterized by healthy appearing mucosa. - The rectum, recto-sigmoid colon, sigmoid colon, descending colon, splenic  flexure, transverse colon and hepatic flexure are normal. - Internal hemorrhoids. - No specimens collected. Last Endoscopy:05/2016- Normal esophagus. - Non-severe reflux esophagitis limited to GE junction. - 2 cm hiatal hernia. - Gastritis. Biopsied- neg h pylori - Normal duodenal bulb and second portion of the duodenum.  Recommendations:  Repeat colonoscopy in Jan 2024  Past Medical History:  Diagnosis Date   Chronic pain syndrome    Colon cancer ([place]) 10/20/13   colon resection-    DDD (degenerative disc disease), cervical    GERD (gastroesophageal reflux disease)    History of kidney stones    seen on imaging but never knowingly passed spontaneously. Never had any manipulation surgery/procedure for it.    History of seasonal allergies    Kidney stone    Kidney stones    Migraine    "maybe 1-2/month" since childhood   Neuropathy 2010   bi lat feet   Primary osteoarthritis    Prostate cancer ([place])    Skin cancer of face    "froze them off"   Stroke [facility])    pt denies not confirmed   Wears glasses     Past Surgical History:  Procedure Laterality Date   ANTERIOR CERVICAL DECOMP/DISCECTOMY FUSION N/A 09/26/2017   Procedure: ACDF - C3-C4 - C5-C6 - C6-C7;  Surgeon: Earnie Larsson, MD;  Location: [place];  Service: Neurosurgery;  Laterality: N/A;   APPENDECTOMY  10/20/13   (with right hemicolectomy)   CARPAL TUNNEL RELEASE Left 2011   CARPAL TUNNEL RELEASE Right 01/07/2013   Procedure: CARPAL TUNNEL RELEASE;  Surgeon: Tennis Must, MD;  Location: [place];  Service: Orthopedics;  Laterality: Right;   COLON RESECTION N/A 10/20/2013   Procedure:  LAPAROSCOPIC hand assisted partial colectomy;  Surgeon: Jamesetta So, MD;  [facility];  Service: General;  Laterality: N/A;   COLON SURGERY  2014   x2- 2nd. to correct a perforation , treated with TPN & drain & then later had to remove more colon    COLONOSCOPY     COLONOSCOPY N/A 10/18/2013   Procedure:  COLONOSCOPY;  Surgeon: Danie Binder, MD;  [facility];  Service: Endoscopy;  Laterality: N/A;  2:00-moved to 1030 Pam to notify pt   COLONOSCOPY N/A 09/22/2014   Procedure: COLONOSCOPY;  Surgeon: Rogene [place], MD;  [facility];  Service: Endoscopy;  Laterality: N/A;  1200   COLONOSCOPY N/A 11/20/2017   Procedure: COLONOSCOPY;  Surgeon: Rogene [place], MD;  [facility];  Service: Endoscopy;  Laterality: N/A;  955   ESOPHAGOGASTRODUODENOSCOPY N/A 06/19/2016   Procedure: ESOPHAGOGASTRODUODENOSCOPY (EGD);  Surgeon: Rogene [place], MD;  [facility];  Service: Endoscopy;  Laterality: N/A;  3:00   EVALUATION UNDER ANESTHESIA WITH HEMORRHOIDECTOMY N/A 08/06/2018   Procedure: EXAM UNDER ANESTHESIA WITHHEMORRHOIDECTOMY ERAS PATHWAY;  Surgeon: Erroll Luna, MD;  Location: [place];  Service: General;  Laterality: N/A;   EXCISIONAL HEMORRHOIDECTOMY     INCISIONAL HERNIA REPAIR  06/20/2015   WITH MYOFASCIAL ADVANCEMENT FLAP AND MESH    INCISIONAL HERNIA REPAIR N/A 06/20/2015   Procedure: INCISIONAL HERNIA REPAIR WITH MYOFASCIAL ADVANCEMENT FLAP AND MESH;  Surgeon: Erroll Luna, MD;  Location: [place];  Service: General;  Laterality: N/A;   INGUINAL HERNIA REPAIR Left    "@ [facility]"   INGUINAL HERNIA REPAIR Bilateral 2000   "[place]"   INSERTION OF MESH N/A 06/20/2015   Procedure: INSERTION OF MESH;  Surgeon: Erroll Luna, MD;  Location: [place];  Service: General;  Laterality: N/A;   KNEE ARTHROSCOPY Bilateral 2001   LYMPHADENECTOMY Bilateral 08/02/2020   Procedure: LYMPHADENECTOMY;  Surgeon: Alexis Frock, MD;  [facility];  Service: Urology;  Laterality: Bilateral;   NASAL SEPTUM SURGERY  1980's   ROBOT ASSISTED LAPAROSCOPIC RADICAL PROSTATECTOMY N/A 08/02/2020   Procedure: XI ROBOTIC ASSISTED LAPAROSCOPIC RADICAL PROSTATECTOMY, EXTENSIVE LYSIS OF ADHESIONS;  Surgeon: Alexis Frock, MD;  [facility];  Service:  Urology;  Laterality: N/A;  3 HRS   TAKE DOWN OF INTESTINAL FISTULA N/A 02/02/2014   Procedure: TAKE DOWN OF ENTEROCUTANEOUS  FISTULA;  Surgeon: Marcello Moores A. Cornett, MD;  Location: [place];  Service: General;  Laterality: N/A;   TONSILLECTOMY     UPPER GI ENDOSCOPY      Current Outpatient Medications  Medication Sig Dispense Refill   clotrimazole-betamethasone (LOTRISONE) cream APPLY EXTERNALLY TO THE AFFECTED AND SURROUNDING AREAS TWICE DAILY IN THE MORNING AND IN THE EVENING     EPINEPHrine 0.3 mg/0.3 mL IJ SOAJ injection Inject 0.3 mLs (0.3 mg total) into the muscle once. 1 Device 0   fluticasone (FLONASE) 50 MCG/ACT nasal spray Place 2 sprays into both nostrils daily as needed for allergies.      HYDROcodone-acetaminophen (NORCO/VICODIN) 5-325 MG tablet Take 1-2 tablets by mouth every 6 (six) hours as needed for moderate pain or severe pain. 20 tablet 0   OVER THE COUNTER MEDICATION Probiotic     ranitidine (ZANTAC) 300 MG tablet Take 300 mg by mouth daily as needed for heartburn.  rizatriptan (MAXALT) 10 MG tablet Take 10 mg by mouth as needed for migraine. May repeat in 2 hours if needed     No current facility-administered medications for this visit.    Allergies as of 06/17/2022 - Review Complete 06/17/2022  Allergen Reaction Noted   Niaspan [niacin er] Rash 10/07/2013   Tape Rash and Other (See Comments) 03/04/2014    Family History  Problem Relation Age of Onset   Colon polyps Father    Colon cancer Father    Colon polyps Paternal Uncle    Colon polyps Maternal Grandfather    Cancer Maternal Grandfather    Breast cancer Mother    Ovarian cancer Mother    Kidney cancer Mother    Cancer Maternal Uncle    Prostate cancer Neg Hx     Social History   Socioeconomic History   Marital status: Married    Spouse name: Not on file   Number of children: 1   Years of education: Not on file   Highest education level: Not on file  Occupational History    Comment: retired   Tobacco Use   Smoking status: Never    Passive exposure: Never   Smokeless tobacco: Never  Vaping Use   Vaping Use: Never used  Substance and Sexual Activity   Alcohol use: No   Drug use: No   Sexual activity: Not Currently    Birth control/protection: None  Other Topics Concern   Not on file  Social History Narrative   Not on file   Social Determinants of Health   Financial Resource Strain: Not on file  Food Insecurity: Not on file  Transportation Needs: Not on file  Physical Activity: Not on file  Stress: Not on file  Social Connections: Not on file   Review of systems General: negative for malaise, night sweats, fever, chills, weight loss Neck: Negative for lumps, goiter, pain and significant neck swelling Resp: Negative for cough, wheezing, dyspnea at rest CV: Negative for chest pain, leg swelling, palpitations, orthopnea GI: denies melena, hematochezia, nausea, vomiting, diarrhea, constipation, odynophagia, or unintentional weight loss. +dysphagia +early satiety +epigastric pain/burning MSK: Negative for joint pain or swelling, back pain, and muscle pain. Derm: Negative for itching or rash Psych: Denies depression, anxiety, memory loss, confusion. No homicidal or suicidal ideation.  Heme: Negative for prolonged bleeding, bruising easily, and swollen nodes. Endocrine: Negative for cold or heat intolerance, polyuria, polydipsia and goiter. Neuro: negative for tremor, gait imbalance, syncope and seizures. The remainder of the review of systems is noncontributory.  Physical Exam: BP 134/88 (BP Location: Left Arm, Patient Position: Sitting, Cuff Size: Large)   Pulse 72   Temp 97.9 F (36.6 C) (Oral)   Ht '6\' 2"'$  (1.88 m)   Wt 213 lb 14.4 oz (97 kg)   BMI 27.46 kg/m  General:   Alert and oriented. No distress noted. Pleasant and cooperative.  Head:  Normocephalic and atraumatic. Eyes:  Conjuctiva clear without scleral icterus. Mouth:  Oral mucosa pink and moist. Good  dentition. No lesions. Heart: Normal rate and rhythm, s1 and s2 heart sounds present.  Lungs: Clear lung sounds in all lobes. Respirations equal and unlabored. Abdomen:  +BS, soft, and non-distended. TTP of epigastric region. No rebound or guarding. No HSM or masses noted. Derm: No palmar erythema or jaundice Msk:  Symmetrical without gross deformities. Normal posture. Extremities:  Without edema. Neurologic:  Alert and  oriented x4 Psych:  Alert and cooperative. Normal mood and affect.  Invalid  input(s): "6 MONTHS"   ASSESSMENT: Eduardo [name] is a 76 y.o. male presenting today as a new patient with dysphagia and severe epigastric pain/burning.   Dysphagia for the past few years though worse over the past 6-8 months, no issues with pills or liquids, was able to eat soft foods but now really can only tolerate liquids. He is also now having severe epigastric pain/burning that began about 1 month ago, worsened by eating. Denies nausea or vomiting. No heavy /frequent NSAID use or ETOH. Hx of reflux esophagitis. Only taking zantact up to QID now with some improvement of pain.  No rectal bleeding or melena. No weight loss. Recommend proceeding with EGD for further evaluation of his symptoms as we cannot rule out esophagitis, esophageal ring, web, stricture or stenosis in regards to dysphagia and in regards to epigastric pain cannot rule out gastritis, PUD, or duodenitis, ultimately cannot rule out presence of malignancy in regards to both dysphagia and epigastric pain. Will start omeprazole '40mg'$  daily and carafate 1g QID. Should avoid all NSAIDs.  Hx of colon cancer, last TCS in 2019 with recommendation to repeat in 5 years. No changes in bowel habits, rectal bleeding or melena. Will plan repeat TCS in January 2024.   Indications, risks and benefits of procedure discussed in detail with patient. Patient verbalized understanding and is in agreement to proceed with EGD at this time.   PLAN:  Rx  omeprazole '40mg'$  once daily 2. Carafate 1g QID 3. Schedule EGD +/- dilation, ASA II 4. Avoid all NSAIDs  All questions were answered, patient verbalized understanding and is in agreement with plan as outlined above.    Follow Up: 3 months   Delton Stelle L. Alver Sorrow, MSN, APRN, AGNP-C Adult-Gerontology Nurse Practitioner [facility]

## 2022-06-17 NOTE — Patient Instructions (Addendum)
It was nice to meet you! We will get you scheduled for an upper endoscopy for further evaluation of your symptoms Please avoid NSAIDs (advil, aleve, naproxen, goody powder, ibuprofen) as these can be very hard on your GI tract, causing inflammation, ulcers and damage to the lining of your GI tract.  I am starting you on omeprazole '40mg'$  once daily, take this 30 minutes before you eat in the mornings I am also sending carafate 1g to be taken with meals and at bedtime, you can take this even if you are not eating, it will help to coat the GI tract and should provide some relief of the pain/burning you are having  Follow up 3 months

## 2022-06-20 ENCOUNTER — Ambulatory Visit (HOSPITAL_BASED_OUTPATIENT_CLINIC_OR_DEPARTMENT_OTHER): Payer: 59 | Admitting: Anesthesiology

## 2022-06-20 ENCOUNTER — Encounter (HOSPITAL_COMMUNITY): Payer: Self-pay | Admitting: Gastroenterology

## 2022-06-20 ENCOUNTER — Other Ambulatory Visit: Payer: Self-pay

## 2022-06-20 ENCOUNTER — Ambulatory Visit (HOSPITAL_COMMUNITY): Payer: 59 | Admitting: Anesthesiology

## 2022-06-20 ENCOUNTER — Ambulatory Visit (HOSPITAL_COMMUNITY)
Admission: RE | Admit: 2022-06-20 | Discharge: 2022-06-20 | Disposition: A | Discharge status: Home / Self Care | Payer: 59 | Attending: Gastroenterology | Admitting: Gastroenterology

## 2022-06-20 ENCOUNTER — Encounter (HOSPITAL_COMMUNITY)
Admission: RE | Disposition: A | Discharge status: Home / Self Care | Payer: Self-pay | Source: Home / Self Care | Attending: Gastroenterology

## 2022-06-20 DIAGNOSIS — Z85828 Personal history of other malignant neoplasm of skin: Secondary | ICD-10-CM | POA: Diagnosis not present

## 2022-06-20 DIAGNOSIS — K2100 Gastro-esophageal reflux disease with esophagitis, without bleeding: Secondary | ICD-10-CM | POA: Diagnosis not present

## 2022-06-20 DIAGNOSIS — G894 Chronic pain syndrome: Secondary | ICD-10-CM | POA: Diagnosis not present

## 2022-06-20 DIAGNOSIS — Z8546 Personal history of malignant neoplasm of prostate: Secondary | ICD-10-CM | POA: Diagnosis not present

## 2022-06-20 DIAGNOSIS — Z85038 Personal history of other malignant neoplasm of large intestine: Secondary | ICD-10-CM | POA: Insufficient documentation

## 2022-06-20 DIAGNOSIS — Z8673 Personal history of transient ischemic attack (TIA), and cerebral infarction without residual deficits: Secondary | ICD-10-CM | POA: Diagnosis not present

## 2022-06-20 DIAGNOSIS — R1310 Dysphagia, unspecified: Secondary | ICD-10-CM | POA: Diagnosis not present

## 2022-06-20 DIAGNOSIS — R1013 Epigastric pain: Secondary | ICD-10-CM | POA: Diagnosis not present

## 2022-06-20 DIAGNOSIS — K222 Esophageal obstruction: Secondary | ICD-10-CM | POA: Insufficient documentation

## 2022-06-20 DIAGNOSIS — D638 Anemia in other chronic diseases classified elsewhere: Secondary | ICD-10-CM

## 2022-06-20 DIAGNOSIS — K319 Disease of stomach and duodenum, unspecified: Secondary | ICD-10-CM | POA: Diagnosis not present

## 2022-06-20 DIAGNOSIS — E039 Hypothyroidism, unspecified: Secondary | ICD-10-CM

## 2022-06-20 DIAGNOSIS — K2950 Unspecified chronic gastritis without bleeding: Secondary | ICD-10-CM | POA: Diagnosis not present

## 2022-06-20 HISTORY — PX: ESOPHAGOGASTRODUODENOSCOPY (EGD) WITH PROPOFOL: SHX5813

## 2022-06-20 HISTORY — PX: BIOPSY: SHX5522

## 2022-06-20 HISTORY — PX: ESOPHAGEAL DILATION: SHX303

## 2022-06-20 SURGERY — ESOPHAGOGASTRODUODENOSCOPY (EGD) WITH PROPOFOL
Anesthesia: General

## 2022-06-20 MED ORDER — LACTATED RINGERS IV SOLN: INTRAVENOUS | Status: DC

## 2022-06-20 MED ORDER — LIDOCAINE HCL (CARDIAC) PF 50 MG/5ML IV SOSY
PREFILLED_SYRINGE | INTRAVENOUS | Status: DC | PRN
  Administered 2022-06-20: 50 mg via INTRAVENOUS

## 2022-06-20 MED ORDER — PROPOFOL 10 MG/ML IV BOLUS
INTRAVENOUS | Status: DC | PRN
  Administered 2022-06-20: 100 mg via INTRAVENOUS
  Administered 2022-06-20: 40 mg via INTRAVENOUS
  Administered 2022-06-20: 30 mg via INTRAVENOUS
  Administered 2022-06-20: 50 mg via INTRAVENOUS

## 2022-06-20 NOTE — Op Note (Addendum)
[facility] Patient Name: Eduardo [name] Procedure Date: 06/20/2022 8:18 AM MRN: [id number] Date of Birth: 03/03/1946 Attending MD: Maylon Peppers ,  CSN: [id number] Age: 76 Admit Type: Outpatient Procedure:                Upper GI endoscopy Indications:              Epigastric abdominal pain, Dysphagia Providers:                Maylon Peppers, Lambert Mody, Kristine L.                            Risa Grill, Technician, Bethel Born,                            [employer] officer Referring MD:              Medicines:                Monitored Anesthesia Care Complications:            No immediate complications. Estimated Blood Loss:     Estimated blood loss: none. Procedure:                Pre-Anesthesia Assessment:                           - Prior to the procedure, a History and Physical                            was performed, and patient medications, allergies                            and sensitivities were reviewed. The patient's                            tolerance of previous anesthesia was reviewed.                           - The risks and benefits of the procedure and the                            sedation options and risks were discussed with the                            patient. All questions were answered and informed                            consent was obtained.                           - ASA Grade Assessment: II - A patient with mild                            systemic disease.                           After obtaining informed consent, the endoscope was  passed under direct vision. Throughout the                            procedure, the patient's blood pressure, pulse, and                            oxygen saturations were monitored continuously. The                            [id number]-[id number] ([id number]) scope was introduced through the                            mouth, and advanced to the second part of duodenum.                             The upper GI endoscopy was accomplished without                            difficulty. The patient tolerated the procedure                            well. Scope In: 8:34:10 AM Scope Out: 8:43:58 AM Total Procedure Duration: 0 hours 9 minutes 48 seconds  Findings:      One benign-appearing, intrinsic moderate (circumferential scarring or       stenosis; an endoscope may pass) stenosis was found at the       gastroesophageal junction. This stenosis measured 1.1 cm (inner       diameter) x less than one cm (in length). The stenosis was traversed. A       guidewire was placed and the scope was withdrawn. Dilation was performed       with a Savary dilator with no resistance at 12.8 mm and mild resistance       at 13 mm and 14 mm. No mucosal disruption was seen upon reinspection.      The entire examined stomach was normal. Biopsies were taken with a cold       forceps for Helicobacter pylori testing.      The examined duodenum was normal. Impression:               - Benign-appearing esophageal stenosis. Dilated.                           - Normal stomach. Biopsied.                           - Normal examined duodenum. Moderate Sedation:      Per Anesthesia Care Recommendation:           - Discharge patient to home (ambulatory).                           - Resume previous diet.                           - Await pathology results.                           -  Use Prilosec (omeprazole) 40 mg PO daily. Procedure Code(s):        --- Professional ---                           [id number], Esophagogastroduodenoscopy, flexible,                            transoral; with insertion of guide wire followed by                            passage of dilator(s) through esophagus over guide                            wire                           43239, 59, Esophagogastroduodenoscopy, flexible,                            transoral; with biopsy, single or multiple Diagnosis Code(s):        ---  Professional ---                           K22.2, Esophageal obstruction                           R10.13, Epigastric pain                           R13.10, Dysphagia, unspecified CPT copyright 2019 [employer]. All rights reserved. The codes documented in this report are preliminary and upon coder review may  be revised to meet current compliance requirements. Maylon Peppers, MD Maylon Peppers,  06/20/2022 8:52:11 AM This report has been signed electronically. Number of Addenda: 1 Addendum Number: 1   Addendum Date: 06/25/2022 5:45:38 PM      CORRECTION      There was presence of mucosal disruption upon reinspection of the GE       junction Maylon Peppers, MD Maylon Peppers,  06/25/2022 5:46:03 PM This report has been signed electronically.

## 2022-06-20 NOTE — Anesthesia Postprocedure Evaluation (Signed)
Anesthesia Post Note  Patient: Eduardo [name]  Procedure(s) Performed: ESOPHAGOGASTRODUODENOSCOPY (EGD) WITH PROPOFOL ESOPHAGEAL DILATION BIOPSY  Patient location during evaluation: Phase II Anesthesia Type: General Level of consciousness: awake Pain management: pain level controlled Vital Signs Assessment: post-procedure vital signs reviewed and stable Respiratory status: spontaneous breathing and respiratory function stable Cardiovascular status: blood pressure returned to baseline and stable Postop Assessment: no headache and no apparent nausea or vomiting Anesthetic complications: no Comments: Late entry   No notable events documented.   Last Vitals:  Vitals:   06/20/22 0650 06/20/22 0848  BP: 132/83 101/62  Pulse: 67 77  Resp: 16 (!) 27  Temp: 36.9 C 36.4 C  SpO2: 96% 95%    Last Pain:  Vitals:   06/20/22 0859  TempSrc:   PainSc: [place]

## 2022-06-20 NOTE — Interval H&P Note (Signed)
History and Physical Interval Note:  06/20/2022 8:15 AM  Eduardo [name]  has presented today for surgery, with the diagnosis of Epigastric Pain Dysphagia.  The various methods of treatment have been discussed with the patient and family. After consideration of risks, benefits and other options for treatment, the patient has consented to  Procedure(s) with comments: ESOPHAGOGASTRODUODENOSCOPY (EGD) WITH PROPOFOL (N/A) - 815 ASA 2 ESOPHAGEAL DILATION (N/A) as a surgical intervention.  The patient's history has been reviewed, patient examined, no change in status, stable for surgery.  I have reviewed the patient's chart and labs.  Questions were answered to the patient's satisfaction.     [name]

## 2022-06-20 NOTE — Discharge Instructions (Addendum)
You are being discharged to home.  Resume your previous diet.  We are waiting for your pathology results.  Take Prilosec (omeprazole) 40 mg by mouth once a day.

## 2022-06-20 NOTE — Transfer of Care (Signed)
Immediate Anesthesia Transfer of Care Note  Patient: Romolo Sieling Hakimian  Procedure(s) Performed: ESOPHAGOGASTRODUODENOSCOPY (EGD) WITH PROPOFOL ESOPHAGEAL DILATION BIOPSY  Patient Location: Endoscopy Unit  Anesthesia Type:General  Level of Consciousness: awake and patient cooperative  Airway & Oxygen Therapy: Patient Spontanous Breathing  Post-op Assessment: Report given to RN and Post -op Vital signs reviewed and stable  Post vital signs: Reviewed and stable  Last Vitals:  Vitals Value Taken Time  BP 101/62 06/20/22 0848  Temp 36.4 C 06/20/22 0848  Pulse 77 06/20/22 0848  Resp 27 06/20/22 0848  SpO2 95 % 06/20/22 0848    Last Pain:  Vitals:   06/20/22 0848  TempSrc: Oral  PainSc: 0-No pain      Patients Stated Pain Goal: 9 (15/61/53 7943)  Complications: No notable events documented.

## 2022-06-20 NOTE — Anesthesia Procedure Notes (Signed)
Date/Time: 06/20/2022 8:30 AM  Performed by: Vista Deck, CRNAPre-anesthesia Checklist: Patient identified, Emergency Drugs available, Suction available, Timeout performed and Patient being monitored Patient Re-evaluated:Patient Re-evaluated prior to induction Oxygen Delivery Method: Nasal Cannula

## 2022-06-20 NOTE — Anesthesia Preprocedure Evaluation (Signed)
Anesthesia Evaluation  Patient identified by MRN, date of birth, ID band Patient awake    Reviewed: Allergy & Precautions, H&P , NPO status , Patient's Chart, lab work & pertinent test results, reviewed documented beta blocker date and time   Airway Mallampati: II  TM Distance: >3 FB Neck ROM: full    Dental no notable dental hx.    Pulmonary neg pulmonary ROS,    Pulmonary exam normal breath sounds clear to auscultation       Cardiovascular Exercise Tolerance: Good negative cardio ROS   Rhythm:regular Rate:Normal     Neuro/Psych  Headaches, CVA negative psych ROS   GI/Hepatic Neg liver ROS, GERD  Medicated,  Endo/Other  Hypothyroidism   Renal/GU negative Renal ROS  negative genitourinary   Musculoskeletal   Abdominal   Peds  Hematology  (+) Blood dyscrasia, anemia ,   Anesthesia Other Findings   Reproductive/Obstetrics negative OB ROS                             Anesthesia Physical Anesthesia Plan  ASA: 3  Anesthesia Plan: General   Post-op Pain Management:    Induction:   PONV Risk Score and Plan: Propofol infusion  Airway Management Planned:   Additional Equipment:   Intra-op Plan:   Post-operative Plan:   Informed Consent: I have reviewed the patients History and Physical, chart, labs and discussed the procedure including the risks, benefits and alternatives for the proposed anesthesia with the patient or authorized representative who has indicated his/her understanding and acceptance.     Dental Advisory Given  Plan Discussed with: CRNA  Anesthesia Plan Comments:         Anesthesia Quick Evaluation

## 2022-06-20 NOTE — Progress Notes (Signed)
Pt c/o 8/10 mid abdominal pain when waking up in post op. VSS. Dr. Jenetta Downer informed.  As pt continued to become more alert,  pain decreased to 5/10.  Upon discharge pt stated pain was better and "I am fine."

## 2022-06-20 NOTE — Interval H&P Note (Signed)
History and Physical Interval Note:  06/20/2022 7:42 AM  Eduardo [name]  has presented today for surgery, with the diagnosis of Epigastric Pain Dysphagia.  The various methods of treatment have been discussed with the patient and family. After consideration of risks, benefits and other options for treatment, the patient has consented to  Procedure(s) with comments: ESOPHAGOGASTRODUODENOSCOPY (EGD) WITH PROPOFOL (N/A) - 815 ASA 2 ESOPHAGEAL DILATION (N/A) as a surgical intervention.  The patient's history has been reviewed, patient examined, no change in status, stable for surgery.  I have reviewed the patient's chart and labs.  Questions were answered to the patient's satisfaction.     [name]

## 2022-06-25 LAB — SURGICAL PATHOLOGY

## 2022-06-27 ENCOUNTER — Encounter (HOSPITAL_COMMUNITY): Payer: Self-pay | Admitting: Gastroenterology

## 2022-07-23 DIAGNOSIS — D510 Vitamin B12 deficiency anemia due to intrinsic factor deficiency: Secondary | ICD-10-CM | POA: Diagnosis not present

## 2022-08-21 DIAGNOSIS — D510 Vitamin B12 deficiency anemia due to intrinsic factor deficiency: Secondary | ICD-10-CM | POA: Diagnosis not present

## 2022-09-19 ENCOUNTER — Encounter (INDEPENDENT_AMBULATORY_CARE_PROVIDER_SITE_OTHER): Payer: Self-pay | Admitting: *Deleted

## 2022-09-19 ENCOUNTER — Ambulatory Visit (INDEPENDENT_AMBULATORY_CARE_PROVIDER_SITE_OTHER): Payer: 59 | Admitting: Gastroenterology

## 2022-09-19 ENCOUNTER — Encounter (INDEPENDENT_AMBULATORY_CARE_PROVIDER_SITE_OTHER): Payer: Self-pay | Admitting: Gastroenterology

## 2022-09-19 VITALS — BP 124/81 | HR 76 | Temp 96.6°F | Ht 74.0 in | Wt 215.8 lb

## 2022-09-19 DIAGNOSIS — R1310 Dysphagia, unspecified: Secondary | ICD-10-CM | POA: Diagnosis not present

## 2022-09-19 DIAGNOSIS — K219 Gastro-esophageal reflux disease without esophagitis: Secondary | ICD-10-CM

## 2022-09-19 DIAGNOSIS — K4090 Unilateral inguinal hernia, without obstruction or gangrene, not specified as recurrent: Secondary | ICD-10-CM | POA: Diagnosis not present

## 2022-09-19 MED ORDER — PEG 3350-KCL-NA BICARB-NACL 420 G PO SOLR: 4000.0000 mL | Freq: Once | ORAL | 0 refills | Status: AC

## 2022-09-19 MED ORDER — FAMOTIDINE 20 MG PO TABS: 20.0000 mg | ORAL_TABLET | Freq: Every day | ORAL | 3 refills | Status: DC

## 2022-09-19 NOTE — Addendum Note (Signed)
Addended by: Cheron Every on: 09/19/2022 09:40 AM   Modules accepted: Orders

## 2022-09-19 NOTE — Patient Instructions (Signed)
Please continue omeprazole '40mg'$  daily, we will add Pepcid '20mg'$  in the evening, continue to try and  Avoid greasy, spicy, fried, citrus foods, and be mindful that caffeine, carbonated drinks, chocolate and alcohol can increase reflux symptoms, Stay upright 2-3 hours after eating, prior to lying down and avoid eating late in the evenings.  We will get you scheduled for updated colonoscopy and refer to Dr. Donella Stade for hernia repair  Follow up 1 year

## 2022-09-19 NOTE — Progress Notes (Addendum)
Referring Provider: Redmond School, MD Primary Care Physician:  Redmond School, MD Primary GI Physician: castaneda   Chief Complaint  Patient presents with   Follow-up    Patient here today for a follow. Patient says dysphasia doing much better after dilation. Patient has a left side inguinal hernia he will need repaired.    HPI:   Eduardo BISSONETTE is a 76 y.o. male with past medical history of chronic pain, colon cancer s/p colon resection in 2014, DDD, GERD, kidney stones, neuropathy, osteoarthritis, prostate cancer, skin cancer   Patient presenting today for follow up of dysphagia.  Last seen in August, complaints of dysphagia where food felt stuck in mid chest to upper abdomen. States this has been ongoing for "a long time" but worse over the past 6-8 months and only able to tolerate softer foods. No issues with liquids of pills. Has to avoid meat altogether. Started having severe pain/burning in epigastric area that is constant. Eating makes pain much worse, only able to tolerate boost shakes at this time because of the pain he is having. Also noting early satiety when he was able to eat. Denies nausea or vomiting. Denies weight loss. Denies rectal bleeding or melena. Is currently only on zantac, taking this up to QID. This calms down his pain some but does not stop the pain. Denies acid regurgitation.    Recommended starting omeprazole '40mg'$  daily, carafate 1g QID, EGD-as outlined below   Present: Patient reports that dysphagia has improved, has very occasional dysphagia now usually with thicker, drier foods but can drink some water and this will pass food down. He is having about 2-3 episodes of heartburn and acid regurgitation per week despite taking omeprazole '40mg'$  daily. He is not taking anything for breakthrough symptoms and GERD usually Depends on certain things he eats. He also notes a left inguinal hernia x3 months he would like to be referred back to gen surg for repair of. No  red flag symptoms. Patient denies melena, hematochezia, nausea, vomiting, diarrhea, constipation, odyonophagia, early satiety or weight loss.   Last Colonoscopy:10/2017- The examined portion of the ileum was normal. - Patent end-to-side ileo-colonic anastomosis, characterized by healthy appearing mucosa. - The rectum, recto-sigmoid colon, sigmoid colon, descending colon, splenic flexure, transverse colon and hepatic flexure are normal. - Internal hemorrhoids. - No specimens collected. Last Endoscopy: 06/20/2022 Benign-appearing esophageal stenosis. Dilated. - Normal stomach. Biopsied-reactive gastropathy  Normal duodenum  Recommendations:  Repeat TCS in January 2024  Past Medical History:  Diagnosis Date   Chronic pain syndrome    Colon cancer ([place]) 10/20/13   colon resection-    DDD (degenerative disc disease), cervical    GERD (gastroesophageal reflux disease)    History of kidney stones    seen on imaging but never knowingly passed spontaneously. Never had any manipulation surgery/procedure for it.    History of seasonal allergies    Kidney stone    Kidney stones    Migraine    "maybe 1-2/month" since childhood   Neuropathy 2010   bi lat feet   Primary osteoarthritis    Prostate cancer ([place])    Skin cancer of face    "froze them off"   Stroke [facility])    pt denies not confirmed   Wears glasses     Past Surgical History:  Procedure Laterality Date   ANTERIOR CERVICAL DECOMP/DISCECTOMY FUSION N/A 09/26/2017   Procedure: ACDF - C3-C4 - C5-C6 - C6-C7;  Surgeon: Earnie Larsson, MD;  Location: [place];  Service: Neurosurgery;  Laterality: N/A;   APPENDECTOMY  10/20/13   (with right hemicolectomy)   BIOPSY  06/20/2022   Procedure: BIOPSY;  Surgeon: Harvel Quale, MD;  [facility];  Service: Gastroenterology;;   CARPAL TUNNEL RELEASE Left 2011   CARPAL TUNNEL RELEASE Right 01/07/2013   Procedure: CARPAL TUNNEL RELEASE;  Surgeon: Tennis Must, MD;  Location:  [place];  Service: Orthopedics;  Laterality: Right;   COLON RESECTION N/A 10/20/2013   Procedure:  LAPAROSCOPIC hand assisted partial colectomy;  Surgeon: Jamesetta So, MD;  [facility];  Service: General;  Laterality: N/A;   COLON SURGERY  2014   x2- 2nd. to correct a perforation , treated with TPN & drain & then later had to remove more colon    COLONOSCOPY     COLONOSCOPY N/A 10/18/2013   Procedure: COLONOSCOPY;  Surgeon: Danie Binder, MD;  [facility];  Service: Endoscopy;  Laterality: N/A;  2:00-moved to 1030 Pam to notify pt   COLONOSCOPY N/A 09/22/2014   Procedure: COLONOSCOPY;  Surgeon: Rogene Houston, MD;  [facility];  Service: Endoscopy;  Laterality: N/A;  1200   COLONOSCOPY N/A 11/20/2017   Procedure: COLONOSCOPY;  Surgeon: Rogene Houston, MD;  [facility];  Service: Endoscopy;  Laterality: N/A;  955   ESOPHAGEAL DILATION N/A 06/20/2022   Procedure: ESOPHAGEAL DILATION;  Surgeon: Harvel Quale, MD;  [facility];  Service: Gastroenterology;  Laterality: N/A;   ESOPHAGOGASTRODUODENOSCOPY N/A 06/19/2016   Procedure: ESOPHAGOGASTRODUODENOSCOPY (EGD);  Surgeon: Rogene Houston, MD;  [facility];  Service: Endoscopy;  Laterality: N/A;  3:00   ESOPHAGOGASTRODUODENOSCOPY (EGD) WITH PROPOFOL N/A 06/20/2022   Procedure: ESOPHAGOGASTRODUODENOSCOPY (EGD) WITH PROPOFOL;  Surgeon: Harvel Quale, MD;  [facility];  Service: Gastroenterology;  Laterality: N/A;  815 ASA 2   EVALUATION UNDER ANESTHESIA WITH HEMORRHOIDECTOMY N/A 08/06/2018   Procedure: EXAM UNDER ANESTHESIA WITHHEMORRHOIDECTOMY ERAS PATHWAY;  Surgeon: Erroll Luna, MD;  Location: [place];  Service: General;  Laterality: N/A;   EXCISIONAL HEMORRHOIDECTOMY     INCISIONAL HERNIA REPAIR  06/20/2015   WITH MYOFASCIAL ADVANCEMENT FLAP AND MESH    INCISIONAL HERNIA REPAIR N/A 06/20/2015   Procedure:  INCISIONAL HERNIA REPAIR WITH MYOFASCIAL ADVANCEMENT FLAP AND MESH;  Surgeon: Erroll Luna, MD;  Location: [place];  Service: General;  Laterality: N/A;   INGUINAL HERNIA REPAIR Left    "@ [facility]"   INGUINAL HERNIA REPAIR Bilateral 2000   "[place]"   INSERTION OF MESH N/A 06/20/2015   Procedure: INSERTION OF MESH;  Surgeon: Erroll Luna, MD;  Location: [place];  Service: General;  Laterality: N/A;   KNEE ARTHROSCOPY Bilateral 2001   LYMPHADENECTOMY Bilateral 08/02/2020   Procedure: LYMPHADENECTOMY;  Surgeon: Alexis Frock, MD;  [facility];  Service: Urology;  Laterality: Bilateral;   NASAL SEPTUM SURGERY  1980's   ROBOT ASSISTED LAPAROSCOPIC RADICAL PROSTATECTOMY N/A 08/02/2020   Procedure: XI ROBOTIC ASSISTED LAPAROSCOPIC RADICAL PROSTATECTOMY, EXTENSIVE LYSIS OF ADHESIONS;  Surgeon: Alexis Frock, MD;  [facility];  Service: Urology;  Laterality: N/A;  3 HRS   TAKE DOWN OF INTESTINAL FISTULA N/A 02/02/2014   Procedure: TAKE DOWN OF ENTEROCUTANEOUS  FISTULA;  Surgeon: Marcello Moores A. Cornett, MD;  Location: [place];  Service: General;  Laterality: N/A;   TONSILLECTOMY     UPPER GI ENDOSCOPY      Current Outpatient Medications  Medication Sig  Dispense Refill   clotrimazole-betamethasone (LOTRISONE) cream APPLY EXTERNALLY TO THE AFFECTED AND SURROUNDING AREAS TWICE DAILY IN THE MORNING AND IN THE EVENING     EPINEPHrine 0.3 mg/0.3 mL IJ SOAJ injection Inject 0.3 mLs (0.3 mg total) into the muscle once. 1 Device 0   fluticasone (FLONASE) 50 MCG/ACT nasal spray Place 2 sprays into both nostrils daily as needed for allergies.      HYDROcodone-acetaminophen (NORCO/VICODIN) 5-325 MG tablet Take 1-2 tablets by mouth every 6 (six) hours as needed for moderate pain or severe pain. 20 tablet 0   omeprazole (PRILOSEC) 40 MG capsule Take 1 capsule (40 mg total) by mouth daily. 60 capsule 1   OVER THE COUNTER MEDICATION Probiotic     ranitidine (ZANTAC) 300 MG tablet Take 300 mg by mouth  daily as needed for heartburn.     rizatriptan (MAXALT) 10 MG tablet Take 10 mg by mouth as needed for migraine. May repeat in 2 hours if needed     No current facility-administered medications for this visit.    Allergies as of 09/19/2022 - Review Complete 09/19/2022  Allergen Reaction Noted   Niaspan [niacin er] Rash 10/07/2013   Tape Rash and Other (See Comments) 03/04/2014    Family History  Problem Relation Age of Onset   Colon polyps Father    Colon cancer Father    Colon polyps Paternal Uncle    Colon polyps Maternal Grandfather    Cancer Maternal Grandfather    Breast cancer Mother    Ovarian cancer Mother    Kidney cancer Mother    Cancer Maternal Uncle    Prostate cancer Neg Hx     Social History   Socioeconomic History   Marital status: Married    Spouse name: Not on file   Number of children: 1   Years of education: Not on file   Highest education level: Not on file  Occupational History    Comment: retired  Tobacco Use   Smoking status: Never    Passive exposure: Never   Smokeless tobacco: Never  Vaping Use   Vaping Use: Never used  Substance and Sexual Activity   Alcohol use: No   Drug use: No   Sexual activity: Not Currently    Birth control/protection: None  Other Topics Concern   Not on file  Social History Narrative   Not on file   Social Determinants of Health   Financial Resource Strain: Not on file  Food Insecurity: Not on file  Transportation Needs: Not on file  Physical Activity: Not on file  Stress: Not on file  Social Connections: Not on file   Review of systems General: negative for malaise, night sweats, fever, chills, weight loss Neck: Negative for lumps, goiter, pain and significant neck swelling Resp: Negative for cough, wheezing, dyspnea at rest CV: Negative for chest pain, leg swelling, palpitations, orthopnea GI: denies melena, hematochezia, nausea, vomiting, diarrhea, constipation, odynophagia, early satiety or  unintentional weight loss. +occasional dysphagia +GERD symptoms MSK: Negative for joint pain or swelling, back pain, and muscle pain. Derm: Negative for itching or rash Psych: Denies depression, anxiety, memory loss, confusion. No homicidal or suicidal ideation.  Heme: Negative for prolonged bleeding, bruising easily, and swollen nodes. Endocrine: Negative for cold or heat intolerance, polyuria, polydipsia and goiter. Neuro: negative for tremor, gait imbalance, syncope and seizures. The remainder of the review of systems is noncontributory.  Physical Exam: BP 124/81 (BP Location: Left Arm, Patient Position: Sitting, Cuff Size: Large)  Pulse 76   Temp (!) 96.6 F (35.9 C) (Temporal)   Ht '6\' 2"'$  (1.88 m)   Wt 215 lb 12.8 oz (97.9 kg)   BMI 27.71 kg/m  General:   Alert and oriented. No distress noted. Pleasant and cooperative.  Head:  Normocephalic and atraumatic. Eyes:  Conjuctiva clear without scleral icterus. Mouth:  Oral mucosa pink and moist. Good dentition. No lesions. Heart: Normal rate and rhythm, s1 and s2 heart sounds present.  Lungs: Clear lung sounds in all lobes. Respirations equal and unlabored. Abdomen:  +BS, soft, non-tender and non-distended. No rebound or guarding. No HSM. Small left sided inguinal hernia, soft but TTP.  Derm: No palmar erythema or jaundice Msk:  Symmetrical without gross deformities. Normal posture. Extremities:  Without edema. Neurologic:  Alert and  oriented x4 Psych:  Alert and cooperative. Normal mood and affect.  Invalid input(s): "6 MONTHS"   ASSESSMENT: Eduardo [name] is a 76 y.o. male presenting today for follow up of dysphagia.  Dysphagia much improved after EGD with dilation. He notes very occasional issues sometimes with thicker foods but can get food to pass with drinking liquids. Encourage to continue chewing precautions taking lites chewing thoroughly and taking sips of liquids between bites.  He is doing well on omeprazole once  daily but having acid reflux symptoms 2-3 times per week, usually related to certain food triggers.  I encouraged him to try and practice good reflux precautions avoiding certain triggers and will add Pepcid 20 mg every evening to see if this better controls his GERD.  Last colonoscopy was in 2019 patient has history of colon cancer he is due for updated colonoscopy in January 2024, we will get him scheduled for this. Indications, risks and benefits of procedure discussed in detail with patient. Patient verbalized understanding and is in agreement to proceed with Colonoscopy at this time.   He also reports a painful left sided hernia that has been present for about 3 months.  He denies any changes in skin color and states that he previously was able to reduce the hernia himself.  He has had no changes in bowel habits but would like to be referred back to Dr. Brantley Stage at [facility] surgery for possible repair of this as he has had multiple hernias repaired in the past.  We will send referral to [facility] surgery.   PLAN:  Continue omeprazole '40mg'$  daily  2. Rx Pepcid '20mg'$  QHS  3. Reflux and chewing precautions 4. Schedule repeat colonoscopy in January, ASA II, ENDO 1 5. Refer to Dr. Brantley Stage for hernia repair    Follow Up: 1 year  Dejour Vos L. Alver Sorrow, MSN, APRN, AGNP-C Adult-Gerontology Nurse Practitioner [facility]  I have reviewed the note and agree with the APP's assessment as described in this progress note  Maylon Peppers, MD Gastroenterology and Hepatology [facility]astroenterology

## 2022-10-22 ENCOUNTER — Telehealth: Payer: Self-pay | Admitting: *Deleted

## 2022-10-22 NOTE — Telephone Encounter (Signed)
Per [employer] no PA is required. Decision ID #:[id number]

## 2022-10-23 ENCOUNTER — Telehealth: Payer: Self-pay | Admitting: *Deleted

## 2022-10-23 DIAGNOSIS — D510 Vitamin B12 deficiency anemia due to intrinsic factor deficiency: Secondary | ICD-10-CM | POA: Diagnosis not present

## 2022-10-23 NOTE — Telephone Encounter (Signed)
Spouse called in. Pt scheduled for 1/12 with Dr. Jenetta Downer and needed to reschedule. Moved to 1/31 at 930am. Aware will send new instructions.

## 2022-10-28 DIAGNOSIS — K432 Incisional hernia without obstruction or gangrene: Secondary | ICD-10-CM | POA: Diagnosis not present

## 2022-10-28 DIAGNOSIS — R1032 Left lower quadrant pain: Secondary | ICD-10-CM | POA: Diagnosis not present

## 2022-11-01 ENCOUNTER — Other Ambulatory Visit (HOSPITAL_COMMUNITY): Payer: Self-pay | Admitting: Surgery

## 2022-11-01 DIAGNOSIS — K432 Incisional hernia without obstruction or gangrene: Secondary | ICD-10-CM

## 2022-11-08 ENCOUNTER — Encounter (HOSPITAL_COMMUNITY): Payer: Self-pay

## 2022-11-08 ENCOUNTER — Ambulatory Visit (HOSPITAL_COMMUNITY): Payer: 59

## 2022-11-12 ENCOUNTER — Ambulatory Visit (HOSPITAL_COMMUNITY)
Admission: RE | Admit: 2022-11-12 | Discharge: 2022-11-12 | Disposition: A | Discharge status: Home / Self Care | Payer: 59 | Source: Ambulatory Visit | Attending: Surgery | Admitting: Surgery

## 2022-11-12 DIAGNOSIS — K432 Incisional hernia without obstruction or gangrene: Secondary | ICD-10-CM | POA: Insufficient documentation

## 2022-11-12 DIAGNOSIS — K7689 Other specified diseases of liver: Secondary | ICD-10-CM | POA: Diagnosis not present

## 2022-11-12 MED ORDER — IOHEXOL 300 MG/ML  SOLN
100.0000 mL | Freq: Once | INTRAMUSCULAR | Status: AC | PRN
  Administered 2022-11-12: 100 mL via INTRAVENOUS

## 2022-11-12 MED ORDER — SODIUM CHLORIDE (PF) 0.9 % IJ SOLN
INTRAMUSCULAR | Status: AC
  Filled 2022-11-12: qty 50

## 2022-11-15 ENCOUNTER — Encounter: Payer: Self-pay | Admitting: Surgery

## 2022-11-20 ENCOUNTER — Encounter (HOSPITAL_COMMUNITY): Payer: Self-pay | Admitting: Gastroenterology

## 2022-11-20 ENCOUNTER — Ambulatory Visit (HOSPITAL_COMMUNITY): Payer: 59 | Admitting: Anesthesiology

## 2022-11-20 ENCOUNTER — Ambulatory Visit (HOSPITAL_BASED_OUTPATIENT_CLINIC_OR_DEPARTMENT_OTHER): Payer: 59 | Admitting: Anesthesiology

## 2022-11-20 ENCOUNTER — Other Ambulatory Visit: Payer: Self-pay

## 2022-11-20 ENCOUNTER — Encounter (HOSPITAL_COMMUNITY)
Admission: RE | Disposition: A | Discharge status: Home / Self Care | Payer: Self-pay | Source: Home / Self Care | Attending: Gastroenterology

## 2022-11-20 ENCOUNTER — Ambulatory Visit (HOSPITAL_COMMUNITY)
Admission: RE | Admit: 2022-11-20 | Discharge: 2022-11-20 | Disposition: A | Discharge status: Home / Self Care | Payer: 59 | Attending: Gastroenterology | Admitting: Gastroenterology

## 2022-11-20 DIAGNOSIS — Z9049 Acquired absence of other specified parts of digestive tract: Secondary | ICD-10-CM | POA: Insufficient documentation

## 2022-11-20 DIAGNOSIS — D122 Benign neoplasm of ascending colon: Secondary | ICD-10-CM | POA: Insufficient documentation

## 2022-11-20 DIAGNOSIS — Z1211 Encounter for screening for malignant neoplasm of colon: Secondary | ICD-10-CM | POA: Diagnosis not present

## 2022-11-20 DIAGNOSIS — E039 Hypothyroidism, unspecified: Secondary | ICD-10-CM | POA: Diagnosis not present

## 2022-11-20 DIAGNOSIS — D124 Benign neoplasm of descending colon: Secondary | ICD-10-CM

## 2022-11-20 DIAGNOSIS — Z79899 Other long term (current) drug therapy: Secondary | ICD-10-CM | POA: Insufficient documentation

## 2022-11-20 DIAGNOSIS — D123 Benign neoplasm of transverse colon: Secondary | ICD-10-CM | POA: Insufficient documentation

## 2022-11-20 DIAGNOSIS — K219 Gastro-esophageal reflux disease without esophagitis: Secondary | ICD-10-CM | POA: Diagnosis not present

## 2022-11-20 DIAGNOSIS — D125 Benign neoplasm of sigmoid colon: Secondary | ICD-10-CM | POA: Diagnosis not present

## 2022-11-20 DIAGNOSIS — M1990 Unspecified osteoarthritis, unspecified site: Secondary | ICD-10-CM | POA: Diagnosis not present

## 2022-11-20 DIAGNOSIS — C189 Malignant neoplasm of colon, unspecified: Secondary | ICD-10-CM

## 2022-11-20 DIAGNOSIS — Z8546 Personal history of malignant neoplasm of prostate: Secondary | ICD-10-CM | POA: Insufficient documentation

## 2022-11-20 DIAGNOSIS — Z981 Arthrodesis status: Secondary | ICD-10-CM | POA: Diagnosis not present

## 2022-11-20 DIAGNOSIS — G894 Chronic pain syndrome: Secondary | ICD-10-CM | POA: Insufficient documentation

## 2022-11-20 DIAGNOSIS — Z85038 Personal history of other malignant neoplasm of large intestine: Secondary | ICD-10-CM | POA: Diagnosis not present

## 2022-11-20 DIAGNOSIS — Z980 Intestinal bypass and anastomosis status: Secondary | ICD-10-CM | POA: Diagnosis not present

## 2022-11-20 DIAGNOSIS — K635 Polyp of colon: Secondary | ICD-10-CM | POA: Diagnosis not present

## 2022-11-20 DIAGNOSIS — K5140 Inflammatory polyps of colon without complications: Secondary | ICD-10-CM | POA: Diagnosis not present

## 2022-11-20 HISTORY — PX: POLYPECTOMY: SHX149

## 2022-11-20 HISTORY — PX: COLONOSCOPY WITH PROPOFOL: SHX5780

## 2022-11-20 LAB — HM COLONOSCOPY

## 2022-11-20 SURGERY — COLONOSCOPY WITH PROPOFOL
Anesthesia: General

## 2022-11-20 MED ORDER — PHENYLEPHRINE HCL (PRESSORS) 10 MG/ML IV SOLN
INTRAVENOUS | Status: DC | PRN
  Administered 2022-11-20 (×5): 80 ug via INTRAVENOUS

## 2022-11-20 MED ORDER — PROPOFOL 500 MG/50ML IV EMUL
INTRAVENOUS | Status: DC | PRN
  Administered 2022-11-20: 150 ug/kg/min via INTRAVENOUS

## 2022-11-20 MED ORDER — STERILE WATER FOR IRRIGATION IR SOLN
Status: DC | PRN
  Administered 2022-11-20: 60 mL

## 2022-11-20 MED ORDER — PROPOFOL 500 MG/50ML IV EMUL
INTRAVENOUS | Status: AC
  Filled 2022-11-20: qty 50

## 2022-11-20 MED ORDER — LACTATED RINGERS IV SOLN: INTRAVENOUS | Status: DC

## 2022-11-20 MED ORDER — PHENYLEPHRINE 80 MCG/ML (10ML) SYRINGE FOR IV PUSH (FOR BLOOD PRESSURE SUPPORT)
PREFILLED_SYRINGE | INTRAVENOUS | Status: AC
  Filled 2022-11-20: qty 10

## 2022-11-20 MED ORDER — PROPOFOL 10 MG/ML IV BOLUS
INTRAVENOUS | Status: DC | PRN
  Administered 2022-11-20: 80 mg via INTRAVENOUS

## 2022-11-20 NOTE — Anesthesia Preprocedure Evaluation (Addendum)
Anesthesia Evaluation  Patient identified by MRN, date of birth, ID band Patient awake    Reviewed: Allergy & Precautions, H&P , NPO status , Patient's Chart, lab work & pertinent test results  Airway Mallampati: II  TM Distance: >3 FB Neck ROM: Limited   Comment: ACDF Dental  (+) Dental Advisory Given, Caps   Pulmonary neg pulmonary ROS   Pulmonary exam normal breath sounds clear to auscultation       Cardiovascular negative cardio ROS Normal cardiovascular exam Rhythm:Regular Rate:Normal     Neuro/Psych  Headaches  Neuromuscular disease CVA, No Residual Symptoms  negative psych ROS   GI/Hepatic Neg liver ROS, Bowel prep,GERD  Medicated and Controlled,,Colon cancer, colon resection    Endo/Other  Hypothyroidism    Renal/GU Renal InsufficiencyRenal disease Bladder dysfunction (prostate cancer)      Musculoskeletal  (+) Arthritis , Osteoarthritis,    Abdominal   Peds negative pediatric ROS (+)  Hematology  (+) Blood dyscrasia, anemia   Anesthesia Other Findings ACDF  Reproductive/Obstetrics negative OB ROS                             Anesthesia Physical Anesthesia Plan  ASA: 3  Anesthesia Plan: General   Post-op Pain Management: Minimal or no pain anticipated   Induction:   PONV Risk Score and Plan: 1 and Propofol infusion  Airway Management Planned: Nasal Cannula and Natural Airway  Additional Equipment:   Intra-op Plan:   Post-operative Plan:   Informed Consent: I have reviewed the patients History and Physical, chart, labs and discussed the procedure including the risks, benefits and alternatives for the proposed anesthesia with the patient or authorized representative who has indicated his/her understanding and acceptance.     Dental advisory given  Plan Discussed with: CRNA and Surgeon  Anesthesia Plan Comments:        Anesthesia Quick Evaluation

## 2022-11-20 NOTE — Discharge Instructions (Addendum)
You are being discharged to home.  °Resume your previous diet.  °We are waiting for your pathology results.  °Your physician has recommended a repeat colonoscopy in two years for surveillance.  °

## 2022-11-20 NOTE — H&P (Signed)
Eduardo [name] is an 77 y.o. male.   Chief Complaint: History of colon cancer HPI: 77 year old male with medical history of colon cancer status post resection, GERD, neuropathy, prostate cancer, stroke, who comes for evaluation of history of colon cancer.  The patient denies having any nausea, vomiting, fever, chills, hematochezia, melena, hematemesis, abdominal distention, abdominal pain, diarrhea, jaundice, pruritus or weight loss.  Last colonoscopy was performed in 2019, no polyps were found.  Past Medical History:  Diagnosis Date   Chronic pain syndrome    Colon cancer ([place]) 10/20/13   colon resection-    DDD (degenerative disc disease), cervical    GERD (gastroesophageal reflux disease)    History of kidney stones    seen on imaging but never knowingly passed spontaneously. Never had any manipulation surgery/procedure for it.    History of seasonal allergies    Kidney stone    Kidney stones    Migraine    "maybe 1-2/month" since childhood   Neuropathy 2010   bi lat feet   Primary osteoarthritis    Prostate cancer ([place])    Skin cancer of face    "froze them off"   Stroke [facility])    pt denies not confirmed   Wears glasses     Past Surgical History:  Procedure Laterality Date   ANTERIOR CERVICAL DECOMP/DISCECTOMY FUSION N/A 09/26/2017   Procedure: ACDF - C3-C4 - C5-C6 - C6-C7;  Surgeon: Earnie Larsson, MD;  Location: [employer];  Service: Neurosurgery;  Laterality: N/A;   APPENDECTOMY  10/20/13   (with right hemicolectomy)   BIOPSY  06/20/2022   Procedure: BIOPSY;  Surgeon: Harvel Quale, MD;  [facility];  Service: Gastroenterology;;   CARPAL TUNNEL RELEASE Left 2011   CARPAL TUNNEL RELEASE Right 01/07/2013   Procedure: CARPAL TUNNEL RELEASE;  Surgeon: Tennis Must, MD;  Location: [place];  Service: Orthopedics;  Laterality: Right;   COLON RESECTION N/A 10/20/2013   Procedure:  LAPAROSCOPIC hand assisted partial colectomy;  Surgeon: Jamesetta So, MD;  [facility];  Service: General;  Laterality: N/A;   COLON SURGERY  2014   x2- 2nd. to correct a perforation , treated with TPN & drain & then later had to remove more colon    COLONOSCOPY     COLONOSCOPY N/A 10/18/2013   Procedure: COLONOSCOPY;  Surgeon: Danie Binder, MD;  [facility];  Service: Endoscopy;  Laterality: N/A;  2:00-moved to 1030 Pam to notify pt   COLONOSCOPY N/A 09/22/2014   Procedure: COLONOSCOPY;  Surgeon: Rogene Houston, MD;  [facility];  Service: Endoscopy;  Laterality: N/A;  1200   COLONOSCOPY N/A 11/20/2017   Procedure: COLONOSCOPY;  Surgeon: Rogene Houston, MD;  [facility];  Service: Endoscopy;  Laterality: N/A;  955   ESOPHAGEAL DILATION N/A 06/20/2022   Procedure: ESOPHAGEAL DILATION;  Surgeon: Harvel Quale, MD;  [facility];  Service: Gastroenterology;  Laterality: N/A;   ESOPHAGOGASTRODUODENOSCOPY N/A 06/19/2016   Procedure: ESOPHAGOGASTRODUODENOSCOPY (EGD);  Surgeon: Rogene Houston, MD;  [facility];  Service: Endoscopy;  Laterality: N/A;  3:00   ESOPHAGOGASTRODUODENOSCOPY (EGD) WITH PROPOFOL N/A 06/20/2022   Procedure: ESOPHAGOGASTRODUODENOSCOPY (EGD) WITH PROPOFOL;  Surgeon: Harvel Quale, MD;  [facility];  Service: Gastroenterology;  Laterality: N/A;  815 ASA 2   EVALUATION UNDER ANESTHESIA WITH HEMORRHOIDECTOMY N/A 08/06/2018   Procedure: EXAM UNDER ANESTHESIA WITHHEMORRHOIDECTOMY ERAS PATHWAY;  Surgeon: Erroll Luna,  MD;  Location: [place];  Service: General;  Laterality: N/A;   EXCISIONAL HEMORRHOIDECTOMY     INCISIONAL HERNIA REPAIR  06/20/2015   WITH MYOFASCIAL ADVANCEMENT FLAP AND MESH    INCISIONAL HERNIA REPAIR N/A 06/20/2015   Procedure: INCISIONAL HERNIA REPAIR WITH MYOFASCIAL ADVANCEMENT FLAP AND MESH;  Surgeon: Erroll Luna, MD;  Location: [place];  Service: General;  Laterality: N/A;   INGUINAL HERNIA REPAIR Left     "@ [facility]"   INGUINAL HERNIA REPAIR Bilateral 2000   "[place]"   INSERTION OF MESH N/A 06/20/2015   Procedure: INSERTION OF MESH;  Surgeon: Erroll Luna, MD;  Location: [place];  Service: General;  Laterality: N/A;   KNEE ARTHROSCOPY Bilateral 2001   LYMPHADENECTOMY Bilateral 08/02/2020   Procedure: LYMPHADENECTOMY;  Surgeon: Alexis Frock, MD;  [facility];  Service: Urology;  Laterality: Bilateral;   NASAL SEPTUM SURGERY  1980's   ROBOT ASSISTED LAPAROSCOPIC RADICAL PROSTATECTOMY N/A 08/02/2020   Procedure: XI ROBOTIC ASSISTED LAPAROSCOPIC RADICAL PROSTATECTOMY, EXTENSIVE LYSIS OF ADHESIONS;  Surgeon: Alexis Frock, MD;  [facility];  Service: Urology;  Laterality: N/A;  3 HRS   TAKE DOWN OF INTESTINAL FISTULA N/A 02/02/2014   Procedure: TAKE DOWN OF ENTEROCUTANEOUS  FISTULA;  Surgeon: Marcello Moores A. Cornett, MD;  [facility];  Service: General;  Laterality: N/A;   TONSILLECTOMY     UPPER GI ENDOSCOPY      Family History  Problem Relation Age of Onset   Colon polyps Father    Colon cancer Father    Colon polyps Paternal Uncle    Colon polyps Maternal Grandfather    Cancer Maternal Grandfather    Breast cancer Mother    Ovarian cancer Mother    Kidney cancer Mother    Cancer Maternal Uncle    Prostate cancer Neg Hx    Social History:  reports that he has never smoked. He has never been exposed to tobacco smoke. He has never used smokeless tobacco. He reports that he does not drink alcohol and does not use drugs.  Allergies:  Allergies  Allergen Reactions   Niaspan [Niacin Er] Rash   Tape Rash and Other (See Comments)    Paper tape OK    Medications Prior to Admission  Medication Sig Dispense Refill   clotrimazole-betamethasone (LOTRISONE) cream APPLY EXTERNALLY TO THE AFFECTED AND SURROUNDING AREAS TWICE DAILY IN THE MORNING AND IN THE EVENING     famotidine (PEPCID) 20 MG tablet Take 1 tablet (20 mg total) by mouth at bedtime. 90 tablet 3   fluticasone  (FLONASE) 50 MCG/ACT nasal spray Place 2 sprays into both nostrils daily as needed for allergies.      HYDROcodone-acetaminophen (NORCO/VICODIN) 5-325 MG tablet Take 1-2 tablets by mouth every 6 (six) hours as needed for moderate pain or severe pain. 20 tablet 0   omeprazole (PRILOSEC) 40 MG capsule Take 1 capsule (40 mg total) by mouth daily. 60 capsule 1   OVER THE COUNTER MEDICATION Probiotic     ranitidine (ZANTAC) 300 MG tablet Take 300 mg by mouth daily as needed for heartburn.     rizatriptan (MAXALT) 10 MG tablet Take 10 mg by mouth as needed for migraine. May repeat in 2 hours if needed     EPINEPHrine 0.3 mg/0.3 mL IJ SOAJ injection Inject 0.3 mLs (0.3 mg total) into the muscle once. 1 Device 0    No results found for this or any previous visit (from the past 48 hour(s)).  No results found.  Review of Systems  All other systems reviewed and are negative.   Blood pressure (!) 157/92, pulse 78, temperature 97.8 F (36.6 C), temperature source Oral, resp. rate 16, height '6\' 2"'$  (1.88 m), weight 96.2 kg, SpO2 94 %. Physical Exam  GENERAL: The patient is AO x3, in no acute distress. HEENT: Head is normocephalic and atraumatic. EOMI are intact. Mouth is well hydrated and without lesions. NECK: Supple. No masses LUNGS: Clear to auscultation. No presence of rhonchi/wheezing/rales. Adequate chest expansion HEART: RRR, normal s1 and s2. ABDOMEN: Soft, nontender, no guarding, no peritoneal signs, and nondistended. BS +. No masses. EXTREMITIES: Without any cyanosis, clubbing, rash, lesions or edema. NEUROLOGIC: AOx3, no focal motor deficit. SKIN: no jaundice, no rashes  Assessment/Plan  77 year old male with medical history of colon cancer status post resection, GERD, neuropathy, prostate cancer, stroke, who comes for evaluation of history of colon cancer. We will proceed with colonoscopy.  Harvel Quale, MD 11/20/2022, 8:06 AM

## 2022-11-20 NOTE — Anesthesia Postprocedure Evaluation (Signed)
Anesthesia Post Note  Patient: Eduardo [name]  Procedure(s) Performed: COLONOSCOPY WITH PROPOFOL POLYPECTOMY INTESTINAL  Patient location during evaluation: Phase II Anesthesia Type: General Level of consciousness: awake and alert and oriented Pain management: pain level controlled Vital Signs Assessment: post-procedure vital signs reviewed and stable Respiratory status: spontaneous breathing, nonlabored ventilation and respiratory function stable Cardiovascular status: blood pressure returned to baseline and stable Postop Assessment: no apparent nausea or vomiting Anesthetic complications: no  No notable events documented.   Last Vitals:  Vitals:   11/20/22 0942 11/20/22 0945  BP: 99/62 105/74  Pulse:    Resp: (!) 21   Temp:    SpO2: 96%     Last Pain:  Vitals:   11/20/22 0942  TempSrc:   PainSc: 0-No pain                 Maddelyn Rocca C Riad Wagley

## 2022-11-20 NOTE — Op Note (Signed)
[facility] Patient Name: Eduardo [name] Procedure Date: 11/20/2022 8:37 AM MRN: [id number] Date of Birth: 1945-11-26 Attending MD: Maylon Peppers , , [id number] CSN: [id number] Age: 77 Admit Type: Outpatient Procedure:                Colonoscopy Indications:              High risk colon cancer surveillance: Personal                            history of colon cancer Providers:                Maylon Peppers, Crystal Page, Aram Candela Referring MD:              Medicines:                Monitored Anesthesia Care Complications:            No immediate complications. Estimated Blood Loss:     Estimated blood loss: none. Procedure:                Pre-Anesthesia Assessment:                           - Prior to the procedure, a History and Physical                            was performed, and patient medications, allergies                            and sensitivities were reviewed. The patient's                            tolerance of previous anesthesia was reviewed.                           - The risks and benefits of the procedure and the                            sedation options and risks were discussed with the                            patient. All questions were answered and informed                            consent was obtained.                           - ASA Grade Assessment: II - A patient with mild                            systemic disease.                           After obtaining informed consent, the colonoscope                            was passed under direct vision. Throughout the  procedure, the patient's blood pressure, pulse, and                            oxygen saturations were monitored continuously. The                            [id number]-[id number] ([id number]) scope was introduced through                            the anus and advanced to the the ileocolonic                            anastomosis. The colonoscopy was performed  without                            difficulty. The patient tolerated the procedure                            well. The quality of the bowel preparation was good. Scope In: 9:02:15 AM Scope Out: 9:33:54 AM Scope Withdrawal Time: 0 hours 21 minutes 24 seconds  Total Procedure Duration: 0 hours 31 minutes 39 seconds  Findings:      The perianal and digital rectal examinations were normal.      The neo-terminal ileum appeared normal.      There was evidence of a prior end-to-side ileo-colonic anastomosis at 80       cm proximal to the anus. This was patent and was characterized by       healthy appearing mucosa. The anastomosis was traversed.      Six sessile polyps were found in the transverse colon and ascending       colon. The polyps were 2 to 6 mm in size. These polyps were removed with       a cold snare. Resection and retrieval were complete.      Four sessile polyps were found in the sigmoid colon and descending       colon. The polyps were 2 to 5 mm in size. These polyps were removed with       a cold snare. Resection and retrieval were complete. Impression:               - The examined portion of the ileum was normal.                           - Patent end-to-side ileo-colonic anastomosis,                            characterized by healthy appearing mucosa.                           - Six 2 to 6 mm polyps in the transverse colon and                            in the ascending colon, removed with a cold snare.  Resected and retrieved.                           - Four 2 to 5 mm polyps in the sigmoid colon and in                            the descending colon, removed with a cold snare.                            Resected and retrieved. Moderate Sedation:      Per Anesthesia Care Recommendation:           - Discharge patient to home (ambulatory).                           - Resume previous diet.                           - Await pathology results.                            - Repeat colonoscopy in 2 years for surveillance. Procedure Code(s):        --- Professional ---                           [id number], Colonoscopy, flexible; with removal of                            tumor(s), polyp(s), or other lesion(s) by snare                            technique Diagnosis Code(s):        --- Professional ---                           T51.761, Personal history of other malignant                            neoplasm of large intestine                           Z98.0, Intestinal bypass and anastomosis status                           D12.3, Benign neoplasm of transverse colon (hepatic                            flexure or splenic flexure)                           D12.2, Benign neoplasm of ascending colon                           D12.5, Benign neoplasm of sigmoid colon                           D12.4, Benign neoplasm of  descending colon CPT copyright 2022 [employer]. All rights reserved. The codes documented in this report are preliminary and upon coder review may  be revised to meet current compliance requirements. Maylon Peppers, MD Maylon Peppers,  11/20/2022 9:39:32 AM This report has been signed electronically. Number of Addenda: 0

## 2022-11-20 NOTE — Transfer of Care (Signed)
Immediate Anesthesia Transfer of Care Note  Patient: Eduardo [name]  Procedure(s) Performed: COLONOSCOPY WITH PROPOFOL POLYPECTOMY INTESTINAL  Patient Location: PACU  Anesthesia Type:General  Level of Consciousness: awake, alert , and oriented  Airway & Oxygen Therapy: Patient Spontanous Breathing  Post-op Assessment: Report given to RN, Patient moving all extremities X 4, and Patient able to stick tongue midline  Post vital signs: Reviewed  Last Vitals:  Vitals Value Taken Time  BP 83/42 11/20/22 0939  Temp 36.4 C 11/20/22 0939  Pulse 66 11/20/22 0939  Resp 15 11/20/22 0939  SpO2 95 % 11/20/22 0939    Last Pain:  Vitals:   11/20/22 0939  TempSrc: Axillary  PainSc:          Complications: No notable events documented.

## 2022-11-21 ENCOUNTER — Encounter (INDEPENDENT_AMBULATORY_CARE_PROVIDER_SITE_OTHER): Payer: Self-pay | Admitting: *Deleted

## 2022-11-21 LAB — SURGICAL PATHOLOGY

## 2022-11-22 DIAGNOSIS — D510 Vitamin B12 deficiency anemia due to intrinsic factor deficiency: Secondary | ICD-10-CM | POA: Diagnosis not present

## 2022-11-25 ENCOUNTER — Encounter (HOSPITAL_COMMUNITY): Payer: Self-pay | Admitting: Gastroenterology

## 2022-11-27 DIAGNOSIS — C61 Malignant neoplasm of prostate: Secondary | ICD-10-CM | POA: Diagnosis not present

## 2022-11-27 DIAGNOSIS — Z6828 Body mass index (BMI) 28.0-28.9, adult: Secondary | ICD-10-CM | POA: Diagnosis not present

## 2022-11-27 DIAGNOSIS — E663 Overweight: Secondary | ICD-10-CM | POA: Diagnosis not present

## 2022-11-27 DIAGNOSIS — C189 Malignant neoplasm of colon, unspecified: Secondary | ICD-10-CM | POA: Diagnosis not present

## 2022-11-27 DIAGNOSIS — G894 Chronic pain syndrome: Secondary | ICD-10-CM | POA: Diagnosis not present

## 2022-11-27 DIAGNOSIS — M1991 Primary osteoarthritis, unspecified site: Secondary | ICD-10-CM | POA: Diagnosis not present

## 2022-11-27 DIAGNOSIS — K219 Gastro-esophageal reflux disease without esophagitis: Secondary | ICD-10-CM | POA: Diagnosis not present

## 2022-12-23 DIAGNOSIS — E538 Deficiency of other specified B group vitamins: Secondary | ICD-10-CM | POA: Diagnosis not present

## 2023-02-19 DIAGNOSIS — G894 Chronic pain syndrome: Secondary | ICD-10-CM | POA: Diagnosis not present

## 2023-02-19 DIAGNOSIS — Z6828 Body mass index (BMI) 28.0-28.9, adult: Secondary | ICD-10-CM | POA: Diagnosis not present

## 2023-02-19 DIAGNOSIS — M1991 Primary osteoarthritis, unspecified site: Secondary | ICD-10-CM | POA: Diagnosis not present

## 2023-02-19 DIAGNOSIS — D510 Vitamin B12 deficiency anemia due to intrinsic factor deficiency: Secondary | ICD-10-CM | POA: Diagnosis not present

## 2023-02-19 DIAGNOSIS — D518 Other vitamin B12 deficiency anemias: Secondary | ICD-10-CM | POA: Diagnosis not present

## 2023-02-19 DIAGNOSIS — E663 Overweight: Secondary | ICD-10-CM | POA: Diagnosis not present

## 2023-03-21 ENCOUNTER — Other Ambulatory Visit: Payer: Self-pay | Admitting: Internal Medicine

## 2023-03-21 DIAGNOSIS — E663 Overweight: Secondary | ICD-10-CM | POA: Diagnosis not present

## 2023-03-21 DIAGNOSIS — C61 Malignant neoplasm of prostate: Secondary | ICD-10-CM | POA: Diagnosis not present

## 2023-03-21 DIAGNOSIS — R4182 Altered mental status, unspecified: Secondary | ICD-10-CM

## 2023-03-21 DIAGNOSIS — W57XXXA Bitten or stung by nonvenomous insect and other nonvenomous arthropods, initial encounter: Secondary | ICD-10-CM | POA: Diagnosis not present

## 2023-03-21 DIAGNOSIS — R519 Headache, unspecified: Secondary | ICD-10-CM

## 2023-03-21 DIAGNOSIS — M1991 Primary osteoarthritis, unspecified site: Secondary | ICD-10-CM | POA: Diagnosis not present

## 2023-03-21 DIAGNOSIS — Z6827 Body mass index (BMI) 27.0-27.9, adult: Secondary | ICD-10-CM | POA: Diagnosis not present

## 2023-03-25 ENCOUNTER — Other Ambulatory Visit (HOSPITAL_COMMUNITY): Payer: Self-pay | Admitting: Internal Medicine

## 2023-03-25 ENCOUNTER — Ambulatory Visit (HOSPITAL_COMMUNITY)
Admission: RE | Admit: 2023-03-25 | Discharge: 2023-03-25 | Disposition: A | Discharge status: Home / Self Care | Payer: 59 | Source: Ambulatory Visit | Attending: Internal Medicine | Admitting: Internal Medicine

## 2023-03-25 DIAGNOSIS — R519 Headache, unspecified: Secondary | ICD-10-CM | POA: Insufficient documentation

## 2023-03-25 DIAGNOSIS — R4182 Altered mental status, unspecified: Secondary | ICD-10-CM | POA: Diagnosis not present

## 2023-03-25 DIAGNOSIS — I6381 Other cerebral infarction due to occlusion or stenosis of small artery: Secondary | ICD-10-CM | POA: Diagnosis not present

## 2023-03-25 NOTE — Progress Notes (Signed)
Attempted to call CT report to phone number provided [phone]) @ 1735 & 1855. Unable to leave VM

## 2023-04-22 DIAGNOSIS — G894 Chronic pain syndrome: Secondary | ICD-10-CM | POA: Diagnosis not present

## 2023-04-22 DIAGNOSIS — E663 Overweight: Secondary | ICD-10-CM | POA: Diagnosis not present

## 2023-04-22 DIAGNOSIS — M1991 Primary osteoarthritis, unspecified site: Secondary | ICD-10-CM | POA: Diagnosis not present

## 2023-04-22 DIAGNOSIS — Z6827 Body mass index (BMI) 27.0-27.9, adult: Secondary | ICD-10-CM | POA: Diagnosis not present

## 2023-04-22 DIAGNOSIS — D518 Other vitamin B12 deficiency anemias: Secondary | ICD-10-CM | POA: Diagnosis not present

## 2023-05-26 DIAGNOSIS — D518 Other vitamin B12 deficiency anemias: Secondary | ICD-10-CM | POA: Diagnosis not present

## 2023-06-09 ENCOUNTER — Ambulatory Visit: Payer: Medicare Other | Admitting: Neurology

## 2023-06-24 DIAGNOSIS — E663 Overweight: Secondary | ICD-10-CM | POA: Diagnosis not present

## 2023-06-24 DIAGNOSIS — G894 Chronic pain syndrome: Secondary | ICD-10-CM | POA: Diagnosis not present

## 2023-06-24 DIAGNOSIS — Z6828 Body mass index (BMI) 28.0-28.9, adult: Secondary | ICD-10-CM | POA: Diagnosis not present

## 2023-06-24 DIAGNOSIS — C61 Malignant neoplasm of prostate: Secondary | ICD-10-CM | POA: Diagnosis not present

## 2023-06-24 DIAGNOSIS — D518 Other vitamin B12 deficiency anemias: Secondary | ICD-10-CM | POA: Diagnosis not present

## 2023-06-24 DIAGNOSIS — M1991 Primary osteoarthritis, unspecified site: Secondary | ICD-10-CM | POA: Diagnosis not present

## 2023-06-27 DIAGNOSIS — B029 Zoster without complications: Secondary | ICD-10-CM | POA: Diagnosis not present

## 2023-07-23 DIAGNOSIS — D511 Vitamin B12 deficiency anemia due to selective vitamin B12 malabsorption with proteinuria: Secondary | ICD-10-CM | POA: Diagnosis not present

## 2023-08-05 ENCOUNTER — Ambulatory Visit
Admission: EM | Admit: 2023-08-05 | Discharge: 2023-08-05 | Disposition: A | Discharge status: Home / Self Care | Payer: 59 | Attending: Family Medicine | Admitting: Family Medicine

## 2023-08-05 ENCOUNTER — Encounter: Payer: Self-pay | Admitting: Emergency Medicine

## 2023-08-05 ENCOUNTER — Other Ambulatory Visit: Payer: Self-pay

## 2023-08-05 DIAGNOSIS — S161XXA Strain of muscle, fascia and tendon at neck level, initial encounter: Secondary | ICD-10-CM

## 2023-08-05 MED ORDER — TIZANIDINE HCL 2 MG PO CAPS: 2.0000 mg | ORAL_CAPSULE | Freq: Three times a day (TID) | ORAL | 0 refills | Status: DC

## 2023-08-05 MED ORDER — DEXAMETHASONE SODIUM PHOSPHATE 10 MG/ML IJ SOLN
10.0000 mg | Freq: Once | INTRAMUSCULAR | Status: AC
  Administered 2023-08-05: 10 mg via INTRAMUSCULAR

## 2023-08-05 NOTE — ED Triage Notes (Addendum)
Pt reports right sided neck pain/stiffness since waking up x5 days. NAD noted. Airway patent.

## 2023-08-05 NOTE — Discharge Instructions (Signed)
In addition to the steroid shot and muscle relaxers, you may use heat and neck wraps, massage, muscle rubs and gentle stretches.

## 2023-08-05 NOTE — ED Provider Notes (Signed)
[facility]    CSN: [id number] Arrival date & time: 08/05/23  0865      History   Chief Complaint Chief Complaint  Patient presents with   Neck Pain    HPI Eduardo [name] is a 77 y.o. male.   Patient presenting today with 5-day history of progressively worsening neck pain and stiffness bilaterally.  States that started on the left lateral aspect and is now across entire neck.  Pain worse with attempted movement.  Denies known injury, severe headache, visual change, dizziness, difficulty breathing or swallowing, radiation of pain down upper extremities, numbness, tingling, weakness.  Trying a muscle rub with mild benefit, hydrocodone not helping.    Past Medical History:  Diagnosis Date   Chronic pain syndrome    Colon cancer (HCC) 10/20/13   colon resection-    DDD (degenerative disc disease), cervical    GERD (gastroesophageal reflux disease)    History of kidney stones    seen on imaging but never knowingly passed spontaneously. Never had any manipulation surgery/procedure for it.    History of seasonal allergies    Kidney stone    Kidney stones    Migraine    "maybe 1-2/month" since childhood   Neuropathy 2010   bi lat feet   Primary osteoarthritis    Prostate cancer (HCC)    Skin cancer of face    "froze them off"   Stroke [facility])    pt denies not confirmed   Wears glasses     Patient Active Problem List   Diagnosis Date Noted   History of colon cancer 11/20/2022   Unilateral inguinal hernia without obstruction or gangrene 09/19/2022   Early satiety 06/17/2022   Dysphagia 06/17/2022   Abdominal pain, epigastric 06/17/2022   Incisional irritation 05/09/2020   Kidney stone 05/09/2020   Neuropathy 05/09/2020   Pernicious anemia 05/09/2020   Rectum pain 05/09/2020   Dehydration    Prostate cancer (HCC) 03/16/2020   Hypokalemia 03/16/2020   COVID-19 virus infection 03/16/2020   Hypophosphatemia 03/16/2020   Generalized weakness 03/16/2020    Hypothyroidism 03/16/2020   Elevated PSA 03/07/2020   Cervical spinal stenosis 09/26/2017   Malignant neoplasm of hepatic flexure (HCC) 08/21/2017   Right ureteral stone 03/21/2017   Urticaria 12/19/2015   Incisional hernia, without obstruction or gangrene 06/20/2015   Hernia of abdominal wall 06/14/2015   Urinary retention 02/07/2014   Enterocutaneous fistula 12/10/2013   Protein-calorie malnutrition, severe (HCC) 11/04/2013   Abscess of abdominal cavity (HCC) 10/27/2013   Colon cancer (HCC) 10/20/2013   Functional constipation 10/07/2013   Colon cancer screening 10/07/2013   GERD (gastroesophageal reflux disease) 10/07/2013    Past Surgical History:  Procedure Laterality Date   ANTERIOR CERVICAL DECOMP/DISCECTOMY FUSION N/A 09/26/2017   Procedure: ACDF - C3-C4 - C5-C6 - C6-C7;  Surgeon: Julio Sicks, MD;  Location: [facility];  Service: Neurosurgery;  Laterality: N/A;   APPENDECTOMY  10/20/13   (with right hemicolectomy)   BIOPSY  06/20/2022   Procedure: BIOPSY;  Surgeon: Dolores Frame, MD;  Location: [facility];  Service: Gastroenterology;;   CARPAL TUNNEL RELEASE Left 2011   CARPAL TUNNEL RELEASE Right 01/07/2013   Procedure: CARPAL TUNNEL RELEASE;  Surgeon: Tami Ribas, MD;  Location: [facility];  Service: Orthopedics;  Laterality: Right;   COLON RESECTION N/A 10/20/2013   Procedure:  LAPAROSCOPIC hand assisted partial colectomy;  Surgeon: Dalia Heading, MD;  Location: [facility];  Service: General;  Laterality: N/A;  COLON SURGERY  2014   x2- 2nd. to correct a perforation , treated with TPN & drain & then later had to remove more colon    COLONOSCOPY     COLONOSCOPY N/A 10/18/2013   Procedure: COLONOSCOPY;  Surgeon: West Bali, MD;  [facility]: [facility];  Service: Endoscopy;  Laterality: N/A;  2:00-moved to 1030 Pam to notify pt   COLONOSCOPY N/A 09/22/2014   Procedure: COLONOSCOPY;  Surgeon: Malissa Hippo, MD;  [facility];   Service: Endoscopy;  Laterality: N/A;  1200   COLONOSCOPY N/A 11/20/2017   Procedure: COLONOSCOPY;  Surgeon: Malissa Hippo, MD;  [facility];  Service: Endoscopy;  Laterality: N/A;  955   COLONOSCOPY WITH PROPOFOL N/A 11/20/2022   Procedure: COLONOSCOPY WITH PROPOFOL;  Surgeon: Dolores Frame, MD;  [facility];  Service: Gastroenterology;  Laterality: N/A;  11:00am, asa 2   ESOPHAGEAL DILATION N/A 06/20/2022   Procedure: ESOPHAGEAL DILATION;  Surgeon: Dolores Frame, MD;  [facility];  Service: Gastroenterology;  Laterality: N/A;   ESOPHAGOGASTRODUODENOSCOPY N/A 06/19/2016   Procedure: ESOPHAGOGASTRODUODENOSCOPY (EGD);  Surgeon: Malissa Hippo, MD;  [facility];  Service: Endoscopy;  Laterality: N/A;  3:00   ESOPHAGOGASTRODUODENOSCOPY (EGD) WITH PROPOFOL N/A 06/20/2022   Procedure: ESOPHAGOGASTRODUODENOSCOPY (EGD) WITH PROPOFOL;  Surgeon: Dolores Frame, MD;  [facility];  Service: Gastroenterology;  Laterality: N/A;  815 ASA 2   EVALUATION UNDER ANESTHESIA WITH HEMORRHOIDECTOMY N/A 08/06/2018   Procedure: EXAM UNDER ANESTHESIA WITHHEMORRHOIDECTOMY ERAS PATHWAY;  Surgeon: Harriette Bouillon, MD;  Location: [facility];  Service: General;  Laterality: N/A;   EXCISIONAL HEMORRHOIDECTOMY     INCISIONAL HERNIA REPAIR  06/20/2015   WITH MYOFASCIAL ADVANCEMENT FLAP AND MESH    INCISIONAL HERNIA REPAIR N/A 06/20/2015   Procedure: INCISIONAL HERNIA REPAIR WITH MYOFASCIAL ADVANCEMENT FLAP AND MESH;  Surgeon: Harriette Bouillon, MD;  Location: [facility];  Service: General;  Laterality: N/A;   INGUINAL HERNIA REPAIR Left    "@ [employer]"   INGUINAL HERNIA REPAIR Bilateral 2000   "[place]"   INSERTION OF MESH N/A 06/20/2015   Procedure: INSERTION OF MESH;  Surgeon: Harriette Bouillon, MD;  Location: [facility];  Service: General;  Laterality: N/A;   KNEE ARTHROSCOPY Bilateral 2001   LYMPHADENECTOMY Bilateral 08/02/2020    Procedure: LYMPHADENECTOMY;  Surgeon: Sebastian Ache, MD;  Location: [facility];  Service: Urology;  Laterality: Bilateral;   NASAL SEPTUM SURGERY  1980's   POLYPECTOMY  11/20/2022   Procedure: POLYPECTOMY INTESTINAL;  Surgeon: Dolores Frame, MD;  [facility];  Service: Gastroenterology;;   ROBOT ASSISTED LAPAROSCOPIC RADICAL PROSTATECTOMY N/A 08/02/2020   Procedure: XI ROBOTIC ASSISTED LAPAROSCOPIC RADICAL PROSTATECTOMY, EXTENSIVE LYSIS OF ADHESIONS;  Surgeon: Sebastian Ache, MD;  Location: [facility];  Service: Urology;  Laterality: N/A;  3 HRS   TAKE DOWN OF INTESTINAL FISTULA N/A 02/02/2014   Procedure: TAKE DOWN OF ENTEROCUTANEOUS  FISTULA;  Surgeon: Maisie Fus A. Cornett, MD;  Location: [facility];  Service: General;  Laterality: N/A;   TONSILLECTOMY     UPPER GI ENDOSCOPY         Home Medications    Prior to Admission medications   Medication Sig Start Date End Date Taking? Authorizing Provider  tizanidine (ZANAFLEX) 2 MG capsule Take 1 capsule (2 mg total) by mouth 3 (three) times daily. Do not drink alcohol or drive while taking this medication.  May cause drowsiness. 08/05/23  Yes Particia Nearing, PA-C  clotrimazole-betamethasone (LOTRISONE) cream APPLY EXTERNALLY TO THE AFFECTED AND SURROUNDING AREAS TWICE DAILY IN THE MORNING AND IN THE EVENING 12/22/19   [provider]  EPINEPHrine 0.3 mg/0.3 mL IJ SOAJ injection Inject 0.3 mLs (0.3 mg total) into the muscle once. 07/16/15   Rancour, Jeannett Senior, MD  famotidine (PEPCID) 20 MG tablet Take 1 tablet (20 mg total) by mouth at bedtime. 09/19/22   Carlan, Chelsea L, NP  fluticasone (FLONASE) 50 MCG/ACT nasal spray Place 2 sprays into both nostrils daily as needed for allergies.  09/28/13   [provider]  HYDROcodone-acetaminophen (NORCO/VICODIN) 5-325 MG tablet Take 1-2 tablets by mouth every 6 (six) hours as needed for moderate pain or severe pain. 08/02/20   Harrie Foreman, PA-C  omeprazole (PRILOSEC) 40  MG capsule Take 1 capsule (40 mg total) by mouth daily. 06/17/22   Carlan, Jeral Pinch, NP  OVER THE COUNTER MEDICATION Probiotic    [provider]  ranitidine (ZANTAC) 300 MG tablet Take 300 mg by mouth daily as needed for heartburn.    [provider]  rizatriptan (MAXALT) 10 MG tablet Take 10 mg by mouth as needed for migraine. May repeat in 2 hours if needed    [provider]    Family History Family History  Problem Relation Age of Onset   Colon polyps Father    Colon cancer Father    Colon polyps Paternal Uncle    Colon polyps Maternal Grandfather    Cancer Maternal Grandfather    Breast cancer Mother    Ovarian cancer Mother    Kidney cancer Mother    Cancer Maternal Uncle    Prostate cancer Neg Hx     Social History Social History   Tobacco Use   Smoking status: Never    Passive exposure: Never   Smokeless tobacco: Never  Vaping Use   Vaping status: Never Used  Substance Use Topics   Alcohol use: No   Drug use: No     Allergies   Niaspan [niacin er] and Tape   Review of Systems Review of Systems Per HPI  Physical Exam Triage Vital Signs ED Triage Vitals [08/05/23 0948]  Encounter Vitals Group     BP (!) 148/77     Systolic BP Percentile      Diastolic BP Percentile      Pulse Rate 67     Resp 20     Temp (!) 97.4 F (36.3 C)     Temp Source Oral     SpO2 97 %     Weight      Height      Head Circumference      Peak Flow      Pain Score 10     Pain Loc      Pain Education      Exclude from Growth Chart    No data found.  Updated Vital Signs BP (!) 148/77 (BP Location: Right Arm)   Pulse 67   Temp (!) 97.4 F (36.3 C) (Oral)   Resp 20   SpO2 97%   Visual Acuity Right Eye Distance:   Left Eye Distance:   Bilateral Distance:    Right Eye Near:   Left Eye Near:    Bilateral Near:     Physical Exam Vitals and nursing note reviewed.  Constitutional:      Appearance: Normal appearance.  HENT:     Head:  Atraumatic.  Nose: Nose normal.     Mouth/Throat:     Mouth: Mucous membranes are moist.  Eyes:     Extraocular Movements: Extraocular movements intact.     Conjunctiva/sclera: Conjunctivae normal.  Cardiovascular:     Rate and Rhythm: Normal rate and regular rhythm.  Pulmonary:     Effort: Pulmonary effort is normal.     Breath sounds: Normal breath sounds. No wheezing or rales.  Musculoskeletal:        General: Tenderness present. No swelling or deformity. Normal range of motion.     Cervical back: Normal range of motion and neck supple.     Comments: No midline spinal tenderness to palpation diffusely.  Normal grip strength bilateral hands.  Tenderness to palpation to bilateral cervical paraspinal musculature extending to the SCM and trapezius  Skin:    General: Skin is warm and dry.     Findings: No bruising or erythema.  Neurological:     General: No focal deficit present.     Mental Status: He is oriented to person, place, and time.     Motor: No weakness.     Gait: Gait normal.     Comments: Bilateral upper extremities neurovascularly intact  Psychiatric:        Mood and Affect: Mood normal.        Thought Content: Thought content normal.        Judgment: Judgment normal.      UC Treatments / Results  Labs (all labs ordered are listed, but only abnormal results are displayed) Labs Reviewed - No data to display  EKG   Radiology No results found.  Procedures Procedures (including critical care time)  Medications Ordered in UC Medications  dexamethasone (DECADRON) injection 10 mg (has no administration in time range)    Initial Impression / Assessment and Plan / UC Course  I have reviewed the triage vital signs and the nursing notes.  Pertinent labs & imaging results that were available during my care of the patient were reviewed by me and considered in my medical decision making (see chart for details).     Suspect muscular strain, treat with IM  Decadron, Zanaflex, heat, soft, stretches.  Return for worsening symptoms.  Final Clinical Impressions(s) / UC Diagnoses   Final diagnoses:  Strain of neck muscle, initial encounter     Discharge Instructions      In addition to the steroid shot and muscle relaxers, you may use heat and neck wraps, massage, muscle rubs and gentle stretches.    ED Prescriptions     Medication Sig Dispense Auth. Provider   tizanidine (ZANAFLEX) 2 MG capsule Take 1 capsule (2 mg total) by mouth 3 (three) times daily. Do not drink alcohol or drive while taking this medication.  May cause drowsiness. 15 capsule Particia Nearing, [place]      PDMP not reviewed this encounter.   Particia Nearing, [place] 08/05/23 1027

## 2023-08-22 DIAGNOSIS — W57XXXA Bitten or stung by nonvenomous insect and other nonvenomous arthropods, initial encounter: Secondary | ICD-10-CM | POA: Diagnosis not present

## 2023-08-22 DIAGNOSIS — R519 Headache, unspecified: Secondary | ICD-10-CM | POA: Diagnosis not present

## 2023-08-22 DIAGNOSIS — B029 Zoster without complications: Secondary | ICD-10-CM | POA: Diagnosis not present

## 2023-08-22 DIAGNOSIS — D518 Other vitamin B12 deficiency anemias: Secondary | ICD-10-CM | POA: Diagnosis not present

## 2023-08-22 DIAGNOSIS — M1991 Primary osteoarthritis, unspecified site: Secondary | ICD-10-CM | POA: Diagnosis not present

## 2023-08-22 DIAGNOSIS — G9332 Myalgic encephalomyelitis/chronic fatigue syndrome: Secondary | ICD-10-CM | POA: Diagnosis not present

## 2023-08-22 DIAGNOSIS — R7309 Other abnormal glucose: Secondary | ICD-10-CM | POA: Diagnosis not present

## 2023-08-22 DIAGNOSIS — Z6827 Body mass index (BMI) 27.0-27.9, adult: Secondary | ICD-10-CM | POA: Diagnosis not present

## 2023-08-22 DIAGNOSIS — G894 Chronic pain syndrome: Secondary | ICD-10-CM | POA: Diagnosis not present

## 2023-08-22 DIAGNOSIS — R4182 Altered mental status, unspecified: Secondary | ICD-10-CM | POA: Diagnosis not present

## 2023-08-22 DIAGNOSIS — C61 Malignant neoplasm of prostate: Secondary | ICD-10-CM | POA: Diagnosis not present

## 2023-08-22 DIAGNOSIS — E663 Overweight: Secondary | ICD-10-CM | POA: Diagnosis not present

## 2023-09-15 ENCOUNTER — Inpatient Hospital Stay (HOSPITAL_COMMUNITY)
Admission: RE | Admit: 2023-09-15 | Discharge: 2023-09-15 | Disposition: A | Discharge status: Home / Self Care | Payer: 59 | Source: Ambulatory Visit

## 2023-09-15 ENCOUNTER — Encounter (INDEPENDENT_AMBULATORY_CARE_PROVIDER_SITE_OTHER): Payer: Self-pay | Admitting: Gastroenterology

## 2023-09-15 ENCOUNTER — Ambulatory Visit (INDEPENDENT_AMBULATORY_CARE_PROVIDER_SITE_OTHER): Payer: 59 | Admitting: Gastroenterology

## 2023-09-15 ENCOUNTER — Telehealth (INDEPENDENT_AMBULATORY_CARE_PROVIDER_SITE_OTHER): Payer: Self-pay | Admitting: Gastroenterology

## 2023-09-15 VITALS — BP 151/88 | HR 68 | Temp 98.5°F | Ht 74.0 in | Wt 212.6 lb

## 2023-09-15 DIAGNOSIS — R1310 Dysphagia, unspecified: Secondary | ICD-10-CM | POA: Diagnosis not present

## 2023-09-15 DIAGNOSIS — K219 Gastro-esophageal reflux disease without esophagitis: Secondary | ICD-10-CM

## 2023-09-15 DIAGNOSIS — R12 Heartburn: Secondary | ICD-10-CM | POA: Diagnosis not present

## 2023-09-15 DIAGNOSIS — Z8719 Personal history of other diseases of the digestive system: Secondary | ICD-10-CM | POA: Insufficient documentation

## 2023-09-15 NOTE — Patient Instructions (Signed)
Schedule EGD Restart omeprazole 40 mg every day Explained presumed etiology of reflux symptoms. Instruction provided in the use of antireflux medication - patient should take medication in the morning 30-45 minutes before eating breakfast. Discussed avoidance of eating within 2 hours of lying down to sleep and benefit of blocks to elevate head of bed. Also, will benefit from avoiding carbonated drinks/sodas or food that has tomatoes, spicy or greasy food. Can take Pepcid as needed at night if still presenting heartburn episodes

## 2023-09-15 NOTE — Pre-Procedure Instructions (Signed)
Called patient for pre-procedure telephone interview. He states he needs to reschedule because he forgot his wife is going out of town tomorrow and he has no there ride. Office message about this.

## 2023-09-15 NOTE — Progress Notes (Unsigned)
Eduardo [name], M.D. Gastroenterology & Hepatology [facility] Gastroenterology [address] [place], [place] [address]  Primary Care Physician: Elfredia Nevins, MD [address] [place] [address]  I will communicate my assessment and recommendations to the referring MD via EMR.  Problems: Recurrent dysphagia History of esophageal stenosis History of colon cancer status post partial colectomy  History of Present Illness: Eduardo [name] is a 77 y.o. male with past medical history of chronic pain, colon cancer s/p colon resection in 2014, DDD, GERD, kidney stones, neuropathy, osteoarthritis, prostate cancer, skin cancer, who presents for follow up of dysphagia.  The patient was last seen on 09/19/2022. At that time, the patient was advised to continue omeprazole 40 mg daily and Pepcid at night.  He was also scheduled to proceed with colonoscopy for surveillance of colon cancer with findings described below.  Patient reports that 1.5 months ago he noticed recurreing issues with swallowing. He did not feel any symptoms prior to this. He reports that he has felt some "choking issues with liquids". He reports that when he eats meat or bread he has felt the food gets stuck in the middle of his chest and he has to spit it out. Has not had to induce vomiting, but sometimes food will regurgitate to his throat and he will need to force swallowing it again.  He reports having recurrent heartburn every 2-3 days, despite taking Pepcid in late evening. He takes the omeprazole only if Pepcid does not help, but does not take any medication on a regular basis.  The patient denies having any nausea, vomiting, fever, chills, hematochezia, melena, hematemesis, abdominal distention, abdominal pain, diarrhea, jaundice, pruritus or weight loss.  Last EGD:06/20/2022 - One benign- appearing, intrinsic moderate ( circumferential scarring or stenosis; an endoscope may pass)  stenosis was found at the gastroesophageal junction. This stenosis measured 1.1 cm ( inner diameter) x less than one cm ( in length) . The stenosis was traversed. A guidewire was placed and the scope was withdrawn. Dilation was performed with a Savary dilator with no resistance at 12. 8 mm and mild resistance at 13 mm and 14 mm. No mucosal disruption was seen upon reinspection. - The entire examined stomach was normal. Biopsies were taken with a cold forceps for Helicobacter pylori testing. - The examined duodenum was normal.  Last Colonoscopy: 11/20/2022 - The examined portion of the ileum was normal. - Patent end- to- side ileo- colonic anastomosis, characterized by healthy appearing mucosa. - Six 2 to 6 mm polyps in the transverse colon and in the ascending colon, removed with a cold snare. Resected and retrieved. - Four 2 to 5 mm polyps in the sigmoid colon and in the descending colon, removed with a cold snare. Resected and retrieved.  Pathology showed 9 colonic tubular adenomas and 1 inflammatory polyp, repeat colonoscopy in 10/2024  Past Medical History: Past Medical History:  Diagnosis Date   Chronic pain syndrome    Colon cancer (HCC) 10/20/13   colon resection-    DDD (degenerative disc disease), cervical    GERD (gastroesophageal reflux disease)    History of kidney stones    seen on imaging but never knowingly passed spontaneously. Never had any manipulation surgery/procedure for it.    History of seasonal allergies    Kidney stone    Kidney stones    Migraine    "maybe 1-2/month" since childhood   Neuropathy 2010   bi lat feet   Primary osteoarthritis  Prostate cancer [facility])    Skin cancer of face    "froze them off"   Stroke [facility])    pt denies not confirmed   Wears glasses     Past Surgical History: Past Surgical History:  Procedure Laterality Date   ANTERIOR CERVICAL DECOMP/DISCECTOMY FUSION N/A 09/26/2017   Procedure: ACDF - C3-C4 - C5-C6 - C6-C7;  Surgeon: Julio Sicks, MD;  Location: [facility] OR;  Service: Neurosurgery;  Laterality: N/A;   APPENDECTOMY  10/20/13   (with right hemicolectomy)   BIOPSY  06/20/2022   Procedure: BIOPSY;  Surgeon: Dolores Frame, MD;  [facility]: [facility];  Service: Gastroenterology;;   CARPAL TUNNEL RELEASE Left 2011   CARPAL TUNNEL RELEASE Right 01/07/2013   Procedure: CARPAL TUNNEL RELEASE;  Surgeon: Tami Ribas, MD;  Location: [facility];  Service: Orthopedics;  Laterality: Right;   COLON RESECTION N/A 10/20/2013   Procedure:  LAPAROSCOPIC hand assisted partial colectomy;  Surgeon: Dalia Heading, MD;  Location: [facility];  Service: General;  Laterality: N/A;   COLON SURGERY  2014   x2- 2nd. to correct a perforation , treated with TPN & drain & then later had to remove more colon    COLONOSCOPY     COLONOSCOPY N/A 10/18/2013   Procedure: COLONOSCOPY;  Surgeon: West Bali, MD;  [facility];  Service: Endoscopy;  Laterality: N/A;  2:00-moved to 1030 Pam to notify pt   COLONOSCOPY N/A 09/22/2014   Procedure: COLONOSCOPY;  Surgeon: Malissa Hippo, MD;  [facility];  Service: Endoscopy;  Laterality: N/A;  1200   COLONOSCOPY N/A 11/20/2017   Procedure: COLONOSCOPY;  Surgeon: Malissa Hippo, MD;  [facility];  Service: Endoscopy;  Laterality: N/A;  955   COLONOSCOPY WITH PROPOFOL N/A 11/20/2022   Procedure: COLONOSCOPY WITH PROPOFOL;  Surgeon: Dolores Frame, MD;  [facility];  Service: Gastroenterology;  Laterality: N/A;  11:00am, asa 2   ESOPHAGEAL DILATION N/A 06/20/2022   Procedure: ESOPHAGEAL DILATION;  Surgeon: Dolores Frame, MD;  [facility];  Service: Gastroenterology;  Laterality: N/A;   ESOPHAGOGASTRODUODENOSCOPY N/A 06/19/2016   Procedure: ESOPHAGOGASTRODUODENOSCOPY (EGD);  Surgeon: Malissa Hippo, MD;  [facility];  Service: Endoscopy;  Laterality: N/A;  3:00   ESOPHAGOGASTRODUODENOSCOPY (EGD)  WITH PROPOFOL N/A 06/20/2022   Procedure: ESOPHAGOGASTRODUODENOSCOPY (EGD) WITH PROPOFOL;  Surgeon: Dolores Frame, MD;  [facility];  Service: Gastroenterology;  Laterality: N/A;  815 ASA 2   EVALUATION UNDER ANESTHESIA WITH HEMORRHOIDECTOMY N/A 08/06/2018   Procedure: EXAM UNDER ANESTHESIA WITHHEMORRHOIDECTOMY ERAS PATHWAY;  Surgeon: Harriette Bouillon, MD;  Location: [facility];  Service: General;  Laterality: N/A;   EXCISIONAL HEMORRHOIDECTOMY     INCISIONAL HERNIA REPAIR  06/20/2015   WITH MYOFASCIAL ADVANCEMENT FLAP AND MESH    INCISIONAL HERNIA REPAIR N/A 06/20/2015   Procedure: INCISIONAL HERNIA REPAIR WITH MYOFASCIAL ADVANCEMENT FLAP AND MESH;  Surgeon: Harriette Bouillon, MD;  Location: [facility];  Service: General;  Laterality: N/A;   INGUINAL HERNIA REPAIR Left    "@ [employer] [facility]"   INGUINAL HERNIA REPAIR Bilateral 2000   "[place]"   INSERTION OF MESH N/A 06/20/2015   Procedure: INSERTION OF MESH;  Surgeon: Harriette Bouillon, MD;  Location: [facility] OR;  Service: General;  Laterality: N/A;   KNEE ARTHROSCOPY Bilateral 2001   LYMPHADENECTOMY Bilateral 08/02/2020   Procedure: LYMPHADENECTOMY;  Surgeon: Sebastian Ache, MD;  Location: [facility];  Service: Urology;  Laterality: Bilateral;   NASAL SEPTUM SURGERY  1980's   POLYPECTOMY  11/20/2022   Procedure: POLYPECTOMY INTESTINAL;  Surgeon: Dolores Frame, MD;  [facility];  Service: Gastroenterology;;   ROBOT ASSISTED LAPAROSCOPIC RADICAL PROSTATECTOMY N/A 08/02/2020   Procedure: XI ROBOTIC ASSISTED LAPAROSCOPIC RADICAL PROSTATECTOMY, EXTENSIVE LYSIS OF ADHESIONS;  Surgeon: Sebastian Ache, MD;  Location: [facility];  Service: Urology;  Laterality: N/A;  3 HRS   TAKE DOWN OF INTESTINAL FISTULA N/A 02/02/2014   Procedure: TAKE DOWN OF ENTEROCUTANEOUS  FISTULA;  Surgeon: Maisie Fus A. Cornett, MD;  Location: [facility];  Service: General;  Laterality: N/A;   TONSILLECTOMY     UPPER GI ENDOSCOPY      Family  History: Family History  Problem Relation Age of Onset   Colon polyps Father    Colon cancer Father    Colon polyps Paternal Uncle    Colon polyps Maternal Grandfather    Cancer Maternal Grandfather    Breast cancer Mother    Ovarian cancer Mother    Kidney cancer Mother    Cancer Maternal Uncle    Prostate cancer Neg Hx     Social History: Social History   Tobacco Use  Smoking Status Never   Passive exposure: Never  Smokeless Tobacco Never   Social History   Substance and Sexual Activity  Alcohol Use No   Social History   Substance and Sexual Activity  Drug Use No    Allergies: Allergies  Allergen Reactions   Niaspan [Niacin Er (Antihyperlipidemic)] Rash   Tape Rash and Other (See Comments)    Paper tape OK    Medications: Current Outpatient Medications  Medication Sig Dispense Refill   clotrimazole-betamethasone (LOTRISONE) cream APPLY EXTERNALLY TO THE AFFECTED AND SURROUNDING AREAS TWICE DAILY IN THE MORNING AND IN THE EVENING     EPINEPHrine 0.3 mg/0.3 mL IJ SOAJ injection Inject 0.3 mLs (0.3 mg total) into the muscle once. 1 Device 0   famotidine (PEPCID) 20 MG tablet Take 1 tablet (20 mg total) by mouth at bedtime. 90 tablet 3   fluticasone (FLONASE) 50 MCG/ACT nasal spray Place 2 sprays into both nostrils daily as needed for allergies.      HYDROcodone-acetaminophen (NORCO/VICODIN) 5-325 MG tablet Take 1-2 tablets by mouth every 6 (six) hours as needed for moderate pain or severe pain. 20 tablet 0   OVER THE COUNTER MEDICATION Probiotic     ranitidine (ZANTAC) 300 MG tablet Take 300 mg by mouth daily as needed for heartburn.     rizatriptan (MAXALT) 10 MG tablet Take 10 mg by mouth as needed for migraine. May repeat in 2 hours if needed     tizanidine (ZANAFLEX) 2 MG capsule Take 1 capsule (2 mg total) by mouth 3 (three) times daily. Do not drink alcohol or drive while taking this medication.  May cause drowsiness. 15 capsule 0   omeprazole (PRILOSEC) 40  MG capsule Take 1 capsule (40 mg total) by mouth daily. (Patient not taking: Reported on 09/15/2023) 60 capsule 1   No current facility-administered medications for this visit.    Review of Systems: GENERAL: negative for malaise, night sweats HEENT: No changes in hearing or vision, no nose bleeds or other nasal problems. NECK: Negative for lumps, goiter, pain and significant neck swelling RESPIRATORY: Negative for cough, wheezing CARDIOVASCULAR: Negative for chest pain, leg swelling, palpitations, orthopnea GI: SEE HPI MUSCULOSKELETAL: Negative for joint pain or swelling, back pain, and muscle pain. SKIN: Negative for lesions,  rash PSYCH: Negative for sleep disturbance, mood disorder and recent psychosocial stressors. HEMATOLOGY Negative for prolonged bleeding, bruising easily, and swollen nodes. ENDOCRINE: Negative for cold or heat intolerance, polyuria, polydipsia and goiter. NEURO: negative for tremor, gait imbalance, syncope and seizures. The remainder of the review of systems is noncontributory.   Physical Exam: BP (!) 151/88   Pulse 68   Temp 98.5 F (36.9 C) (Oral)   Ht 6\' 2"  (1.88 m)   Wt 212 lb 9.6 oz (96.4 kg)   BMI 27.30 kg/m  GENERAL: The patient is AO x3, in no acute distress. HEENT: Head is normocephalic and atraumatic. EOMI are intact. Mouth is well hydrated and without lesions. NECK: Supple. No masses LUNGS: Clear to auscultation. No presence of rhonchi/wheezing/rales. Adequate chest expansion HEART: RRR, normal s1 and s2. ABDOMEN: Soft, nontender, no guarding, no peritoneal signs, and nondistended. BS +. No masses. RECTAL EXAM: no external lesions, normal tone, no masses, brown stool without blood.*** Chaperone: EXTREMITIES: Without any cyanosis, clubbing, rash, lesions or edema. NEUROLOGIC: AOx3, no focal motor deficit. SKIN: no jaundice, no rashes  Imaging/Labs: as above  I personally reviewed and interpreted the available labs, imaging and endoscopic  files.  Impression and Plan: YENIEL HARBOLD is a 77 y.o. male coming for follow up of ***   All questions were answered.      Eduardo Blazing, MD Gastroenterology and Hepatology [facility] Gastroenterology

## 2023-09-15 NOTE — Telephone Encounter (Signed)
[name], RN  Estudillo, Mindy S, CMA; Marlowe Shores, LPN Cc: Eduardo [name] ladies! I just got off the phone with Weyman Croon. He forgot his wife is going out of town tomorrow and wants to reschedule his procedure because he has no other ride.

## 2023-09-16 ENCOUNTER — Ambulatory Visit (HOSPITAL_COMMUNITY): Admission: RE | Admit: 2023-09-16 | Payer: 59 | Source: Ambulatory Visit | Admitting: Gastroenterology

## 2023-09-16 ENCOUNTER — Encounter (HOSPITAL_COMMUNITY): Admission: RE | Payer: Self-pay | Source: Ambulatory Visit

## 2023-09-16 DIAGNOSIS — D511 Vitamin B12 deficiency anemia due to selective vitamin B12 malabsorption with proteinuria: Secondary | ICD-10-CM | POA: Diagnosis not present

## 2023-09-16 SURGERY — ESOPHAGOGASTRODUODENOSCOPY (EGD) WITH PROPOFOL
Anesthesia: Monitor Anesthesia Care

## 2023-09-16 NOTE — Telephone Encounter (Signed)
Pt contacted. Pt states that his wife was suppose to call yesterday but he guesses she forgot. Pt has to work around wife schedule so she can bring him. Advised pt to have wife give me a call so we can schedule. Pt verbalized understanding.  (EGD any room Dr.Castaneda)

## 2023-10-30 DIAGNOSIS — D511 Vitamin B12 deficiency anemia due to selective vitamin B12 malabsorption with proteinuria: Secondary | ICD-10-CM | POA: Diagnosis not present

## 2023-12-15 ENCOUNTER — Emergency Department (HOSPITAL_COMMUNITY): Payer: 59

## 2023-12-15 ENCOUNTER — Emergency Department (HOSPITAL_COMMUNITY)
Admission: EM | Admit: 2023-12-15 | Discharge: 2023-12-15 | Disposition: A | Discharge status: Home / Self Care | Payer: 59 | Attending: Emergency Medicine | Admitting: Emergency Medicine

## 2023-12-15 ENCOUNTER — Other Ambulatory Visit: Payer: Self-pay

## 2023-12-15 ENCOUNTER — Encounter (HOSPITAL_COMMUNITY): Payer: Self-pay

## 2023-12-15 DIAGNOSIS — W1839XA Other fall on same level, initial encounter: Secondary | ICD-10-CM | POA: Diagnosis not present

## 2023-12-15 DIAGNOSIS — M79601 Pain in right arm: Secondary | ICD-10-CM | POA: Diagnosis present

## 2023-12-15 MED ORDER — NAPROXEN 500 MG PO TABS: 500.0000 mg | ORAL_TABLET | Freq: Two times a day (BID) | ORAL | 0 refills | Status: DC

## 2023-12-15 MED ORDER — NAPROXEN 250 MG PO TABS
500.0000 mg | ORAL_TABLET | Freq: Once | ORAL | Status: AC
  Administered 2023-12-15: 500 mg via ORAL
  Filled 2023-12-15: qty 2

## 2023-12-15 MED ORDER — OXYCODONE-ACETAMINOPHEN 5-325 MG PO TABS
1.0000 | ORAL_TABLET | Freq: Once | ORAL | Status: AC
  Administered 2023-12-15: 1 via ORAL
  Filled 2023-12-15: qty 1

## 2023-12-15 MED ORDER — OXYCODONE-ACETAMINOPHEN 5-325 MG PO TABS: 1.0000 | ORAL_TABLET | Freq: Four times a day (QID) | ORAL | 0 refills | Status: DC | PRN

## 2023-12-15 MED ORDER — OXYCODONE-ACETAMINOPHEN 5-325 MG PO TABS: 1.0000 | ORAL_TABLET | Freq: Three times a day (TID) | ORAL | 0 refills | Status: DC | PRN

## 2023-12-15 NOTE — ED Provider Notes (Signed)
 [place] EMERGENCY DEPARTMENT AT [facility] Provider Note   CSN: [id number] Arrival date & time: 12/15/23  7829     History  No chief complaint on file.   Eduardo [name] is a 78 y.o. male.  Had a fall a week ago on outstretched right hand. Had some severe pain for the first couple days. Improved a bit but then tonight it was severe again and wondered if maybe he broke it so came here for eval. Has a splint at home that he has been using. No other injuries.         Home Medications Prior to Admission medications   Medication Sig Start Date End Date Taking? Authorizing Provider  naproxen (NAPROSYN) 500 MG tablet Take 1 tablet (500 mg total) by mouth 2 (two) times daily. 12/15/23  Yes Nadege Carriger, Barbara Cower, MD  oxyCODONE-acetaminophen (PERCOCET) 5-325 MG tablet Take 1-2 tablets by mouth every 8 (eight) hours as needed for severe pain (pain score 7-10). 12/15/23  Yes Afsheen Antony, Barbara Cower, MD  oxyCODONE-acetaminophen (PERCOCET/ROXICET) 5-325 MG tablet Take 1 tablet by mouth every 6 (six) hours as needed for severe pain (pain score 7-10). 12/15/23  Yes Boni Maclellan, Barbara Cower, MD  clotrimazole-betamethasone (LOTRISONE) cream APPLY EXTERNALLY TO THE AFFECTED AND SURROUNDING AREAS TWICE DAILY IN THE MORNING AND IN THE EVENING 12/22/19   [provider]  EPINEPHrine 0.3 mg/0.3 mL IJ SOAJ injection Inject 0.3 mLs (0.3 mg total) into the muscle once. 07/16/15   Rancour, Jeannett Senior, MD  famotidine (PEPCID) 20 MG tablet Take 1 tablet (20 mg total) by mouth at bedtime. 09/19/22   Carlan, Chelsea L, NP  fluticasone (FLONASE) 50 MCG/ACT nasal spray Place 2 sprays into both nostrils daily as needed for allergies.  09/28/13   [provider]  HYDROcodone-acetaminophen (NORCO/VICODIN) 5-325 MG tablet Take 1-2 tablets by mouth every 6 (six) hours as needed for moderate pain or severe pain. 08/02/20   Harrie Foreman, PA-C  omeprazole (PRILOSEC) 40 MG capsule Take 1 capsule (40 mg total) by mouth  daily. Patient not taking: Reported on 09/15/2023 06/17/22   Raquel James, NP  OVER THE COUNTER MEDICATION Probiotic    [provider]  ranitidine (ZANTAC) 300 MG tablet Take 300 mg by mouth daily as needed for heartburn.    [provider]  rizatriptan (MAXALT) 10 MG tablet Take 10 mg by mouth as needed for migraine. May repeat in 2 hours if needed    [provider]  tizanidine (ZANAFLEX) 2 MG capsule Take 1 capsule (2 mg total) by mouth 3 (three) times daily. Do not drink alcohol or drive while taking this medication.  May cause drowsiness. 08/05/23   Particia Nearing, PA-C      Allergies    Niaspan [niacin er (antihyperlipidemic)] and Tape    Review of Systems   Review of Systems  Physical Exam Updated Vital Signs BP (!) 157/86   Pulse 75   Temp 98.3 F (36.8 C) (Oral)   Resp 19   Ht 6\' 2"  (1.88 m)   Wt 96.2 kg   SpO2 96%   BMI 27.22 kg/m  Physical Exam Vitals and nursing note reviewed.  Constitutional:      Appearance: He is well-developed.  HENT:     Head: Normocephalic and atraumatic.  Cardiovascular:     Rate and Rhythm: Normal rate.  Pulmonary:     Effort: Pulmonary effort is normal. No respiratory distress.  Abdominal:     General: There is no distension.  Musculoskeletal:        General: Normal range of motion.     Cervical back: Normal range of motion.     Comments: Tender to palpation distal forearm and right medial wrist, worse with palpation. Worse with supination/pronation. Mild swelling in same area. Sensation intact pulses intact.  Neurological:     Mental Status: He is alert.     ED Results / Procedures / Treatments   Labs (all labs ordered are listed, but only abnormal results are displayed) Labs Reviewed - No data to display  EKG None  Radiology DG Hand Complete Right Result Date: 12/15/2023 CLINICAL DATA:  Fall 1 week ago, right wrist pain EXAM: RIGHT HAND - COMPLETE 3+ VIEW COMPARISON:  Forearm  series today FINDINGS: There is no evidence of fracture or dislocation. There is no evidence of arthropathy or other focal bone abnormality. Soft tissues are unremarkable. IMPRESSION: Negative. Electronically Signed   By: Charlett Nose M.D.   On: 12/15/2023 03:26   DG Forearm Right Result Date: 12/15/2023 CLINICAL DATA:  Fall 1 week ago.  Right wrist pain. EXAM: RIGHT FOREARM - 2 VIEW COMPARISON:  None Available. FINDINGS: There is no evidence of fracture or other focal bone lesions. Soft tissues are unremarkable. IMPRESSION: Negative. Electronically Signed   By: Charlett Nose M.D.   On: 12/15/2023 03:26    Procedures Procedures    Medications Ordered in ED Medications  oxyCODONE-acetaminophen (PERCOCET/ROXICET) 5-325 MG per tablet 1 tablet (1 tablet Oral Given 12/15/23 0311)  naproxen (NAPROSYN) tablet 500 mg (500 mg Oral Given 12/15/23 4098)    ED Course/ Medical Decision Making/ A&P                                 Medical Decision Making Amount and/or Complexity of Data Reviewed Radiology: ordered.  Risk Prescription drug management.   Xr viewed and interpreted by myself without obvious wrist/forearm fracture. Suspect possible interosseus sprain vs wrist sprain. Pulses/neuro intact making more severe injury unlikely.   Considered sugar tong but patient prefers to use his wrist splint which should limit use and movement enough to help with healing. Will refer to hand surgery for follow up and consideration of MRI if still not improving.   Final Clinical Impression(s) / ED Diagnoses Final diagnoses:  Right arm pain    Rx / DC Orders ED Discharge Orders          Ordered    oxyCODONE-acetaminophen (PERCOCET/ROXICET) 5-325 MG tablet  Every 6 hours PRN        12/15/23 0349    oxyCODONE-acetaminophen (PERCOCET) 5-325 MG tablet  Every 8 hours PRN        12/15/23 0349    naproxen (NAPROSYN) 500 MG tablet  2 times daily        12/15/23 0349    AMB referral to orthopedics         12/15/23 0350              Izzabell Klasen, Barbara Cower, MD 12/15/23 [phone]

## 2023-12-15 NOTE — ED Triage Notes (Signed)
 Pt fell out of bed 1 week ago, landing on right hand. C/o pain in right wrist, unable to move fingers well or flex wrist. States he was hoping the pain would resolve but it has gotten worse and he could not bear the pain anymore.

## 2023-12-16 MED FILL — Oxycodone w/ Acetaminophen Tab 5-325 MG: ORAL | Qty: 6 | Status: AC

## 2023-12-26 DIAGNOSIS — D511 Vitamin B12 deficiency anemia due to selective vitamin B12 malabsorption with proteinuria: Secondary | ICD-10-CM | POA: Diagnosis not present

## 2024-01-07 NOTE — Telephone Encounter (Signed)
 Pt wife left message to reschedule EGD. Returned call to wife but had to leave message to return call

## 2024-01-09 NOTE — Telephone Encounter (Signed)
 Pt wife returned call. Pt scheduled. Tuesday and Thursdays worked best for wife. Instructions will be mailed. No pa needed per insurance

## 2024-01-09 NOTE — Telephone Encounter (Signed)
 Left message to return call

## 2024-01-30 ENCOUNTER — Encounter (HOSPITAL_COMMUNITY)
Admission: RE | Admit: 2024-01-30 | Discharge: 2024-01-30 | Disposition: A | Discharge status: Home / Self Care | Source: Ambulatory Visit | Attending: Gastroenterology | Admitting: Gastroenterology

## 2024-01-30 ENCOUNTER — Encounter (HOSPITAL_COMMUNITY): Payer: Self-pay

## 2024-01-30 ENCOUNTER — Other Ambulatory Visit: Payer: Self-pay

## 2024-02-02 ENCOUNTER — Telehealth (INDEPENDENT_AMBULATORY_CARE_PROVIDER_SITE_OTHER): Payer: Self-pay | Admitting: Gastroenterology

## 2024-02-02 NOTE — Telephone Encounter (Signed)
 Message received from Anitra stating pt needs to reschedule due to being sick. Contacted pt and rescheduled him to 02/13/24 at 10:45am. New instructions will be mailed to pt.

## 2024-02-03 ENCOUNTER — Encounter (HOSPITAL_COMMUNITY): Admission: RE | Payer: Self-pay | Source: Ambulatory Visit

## 2024-02-03 ENCOUNTER — Ambulatory Visit (HOSPITAL_COMMUNITY): Admission: RE | Admit: 2024-02-03 | Source: Ambulatory Visit | Admitting: Gastroenterology

## 2024-02-03 SURGERY — EGD (ESOPHAGOGASTRODUODENOSCOPY)
Anesthesia: Choice

## 2024-02-13 ENCOUNTER — Other Ambulatory Visit: Payer: Self-pay

## 2024-02-13 ENCOUNTER — Ambulatory Visit (HOSPITAL_COMMUNITY): Admitting: Anesthesiology

## 2024-02-13 ENCOUNTER — Encounter (HOSPITAL_COMMUNITY): Payer: Self-pay | Admitting: Gastroenterology

## 2024-02-13 ENCOUNTER — Ambulatory Visit (HOSPITAL_COMMUNITY)
Admission: RE | Admit: 2024-02-13 | Discharge: 2024-02-13 | Disposition: A | Discharge status: Home / Self Care | Attending: Gastroenterology | Admitting: Gastroenterology

## 2024-02-13 ENCOUNTER — Encounter (HOSPITAL_COMMUNITY)
Admission: RE | Disposition: A | Discharge status: Home / Self Care | Payer: Self-pay | Source: Home / Self Care | Attending: Gastroenterology

## 2024-02-13 DIAGNOSIS — R1310 Dysphagia, unspecified: Secondary | ICD-10-CM | POA: Insufficient documentation

## 2024-02-13 DIAGNOSIS — Z79899 Other long term (current) drug therapy: Secondary | ICD-10-CM | POA: Insufficient documentation

## 2024-02-13 DIAGNOSIS — K3189 Other diseases of stomach and duodenum: Secondary | ICD-10-CM | POA: Insufficient documentation

## 2024-02-13 DIAGNOSIS — K219 Gastro-esophageal reflux disease without esophagitis: Secondary | ICD-10-CM | POA: Diagnosis not present

## 2024-02-13 DIAGNOSIS — K222 Esophageal obstruction: Secondary | ICD-10-CM

## 2024-02-13 DIAGNOSIS — M1990 Unspecified osteoarthritis, unspecified site: Secondary | ICD-10-CM | POA: Insufficient documentation

## 2024-02-13 DIAGNOSIS — E039 Hypothyroidism, unspecified: Secondary | ICD-10-CM | POA: Diagnosis not present

## 2024-02-13 DIAGNOSIS — I10 Essential (primary) hypertension: Secondary | ICD-10-CM | POA: Insufficient documentation

## 2024-02-13 DIAGNOSIS — G629 Polyneuropathy, unspecified: Secondary | ICD-10-CM | POA: Diagnosis not present

## 2024-02-13 DIAGNOSIS — G894 Chronic pain syndrome: Secondary | ICD-10-CM | POA: Insufficient documentation

## 2024-02-13 HISTORY — PX: ESOPHAGOGASTRODUODENOSCOPY: SHX5428

## 2024-02-13 SURGERY — EGD (ESOPHAGOGASTRODUODENOSCOPY)
Anesthesia: General

## 2024-02-13 MED ORDER — LACTATED RINGERS IV SOLN: INTRAVENOUS | Status: DC

## 2024-02-13 MED ORDER — LIDOCAINE 2% (20 MG/ML) 5 ML SYRINGE
INTRAMUSCULAR | Status: DC | PRN
  Administered 2024-02-13: 50 mg via INTRAVENOUS

## 2024-02-13 MED ORDER — PROPOFOL 500 MG/50ML IV EMUL
INTRAVENOUS | Status: AC
Start: 2024-02-13 — End: ?
  Filled 2024-02-13: qty 50

## 2024-02-13 MED ORDER — PROPOFOL 500 MG/50ML IV EMUL
INTRAVENOUS | Status: DC | PRN
  Administered 2024-02-13: 180 ug/kg/min via INTRAVENOUS
  Administered 2024-02-13: 80 mg via INTRAVENOUS

## 2024-02-13 MED ORDER — LACTATED RINGERS IV SOLN: INTRAVENOUS | Status: DC | PRN

## 2024-02-13 MED ORDER — LIDOCAINE 2% (20 MG/ML) 5 ML SYRINGE
INTRAMUSCULAR | Status: AC
  Filled 2024-02-13: qty 5

## 2024-02-13 NOTE — Transfer of Care (Signed)
 Immediate Anesthesia Transfer of Care Note  Patient: Eduardo [name]  Procedure(s) Performed: EGD (ESOPHAGOGASTRODUODENOSCOPY)  Patient Location: Endoscopy Unit  Anesthesia Type:General  Level of Consciousness: drowsy, patient cooperative, and responds to stimulation  Airway & Oxygen  Therapy: Patient Spontanous Breathing  Post-op Assessment: Report given to RN, Post -op Vital signs reviewed and stable, Patient moving all extremities X 4, and Patient able to stick tongue midline  Post vital signs: Reviewed and stable  Last Vitals:  Vitals Value Taken Time  BP 115/78 02/13/24 1050  Temp 36.4 C 02/13/24 1050  Pulse 66 02/13/24 1050  Resp 22 02/13/24 1050  SpO2 97 % 02/13/24 1050    Last Pain:  Vitals:   02/13/24 1050  TempSrc: Axillary  PainSc:       Patients Stated Pain Goal: 10 (02/13/24 0932)  Complications: No notable events documented.

## 2024-02-13 NOTE — Anesthesia Postprocedure Evaluation (Signed)
 Anesthesia Post Note  Patient: Eduardo [name]  Procedure(s) Performed: EGD (ESOPHAGOGASTRODUODENOSCOPY)  Patient location during evaluation: Phase II Anesthesia Type: General Level of consciousness: awake Pain management: pain level controlled Vital Signs Assessment: post-procedure vital signs reviewed and stable Respiratory status: spontaneous breathing and respiratory function stable Cardiovascular status: blood pressure returned to baseline and stable Postop Assessment: no headache and no apparent nausea or vomiting Anesthetic complications: no Comments: Late entry   No notable events documented.   Last Vitals:  Vitals:   02/13/24 0932 02/13/24 1050  BP: (!) 149/88 115/78  Pulse: 69 66  Resp: 19 (!) 22  Temp: 36.6 C (!) 36.4 C  SpO2: 95% 97%    Last Pain:  Vitals:   02/13/24 1059  TempSrc:   PainSc: 0-No pain                 Coretha Dew

## 2024-02-13 NOTE — Anesthesia Preprocedure Evaluation (Signed)
 Anesthesia Evaluation  Patient identified by MRN, date of birth, ID band Patient awake    Reviewed: Allergy & Precautions, H&P , NPO status , Patient's Chart, lab work & pertinent test results, reviewed documented beta blocker date and time   Airway Mallampati: II  TM Distance: >3 FB Neck ROM: full    Dental no notable dental hx.    Pulmonary neg pulmonary ROS   Pulmonary exam normal breath sounds clear to auscultation       Cardiovascular Exercise Tolerance: Good hypertension, negative cardio ROS  Rhythm:regular Rate:Normal     Neuro/Psych  Headaches CVA negative neurological ROS  negative psych ROS   GI/Hepatic negative GI ROS, Neg liver ROS,GERD  ,,  Endo/Other  negative endocrine ROSHypothyroidism    Renal/GU Renal diseasenegative Renal ROS  negative genitourinary   Musculoskeletal   Abdominal   Peds  Hematology negative hematology ROS (+) Blood dyscrasia, anemia   Anesthesia Other Findings   Reproductive/Obstetrics negative OB ROS                             Anesthesia Physical Anesthesia Plan  ASA: 2  Anesthesia Plan: General   Post-op Pain Management:    Induction:   PONV Risk Score and Plan: Propofol  infusion  Airway Management Planned:   Additional Equipment:   Intra-op Plan:   Post-operative Plan:   Informed Consent: I have reviewed the patients History and Physical, chart, labs and discussed the procedure including the risks, benefits and alternatives for the proposed anesthesia with the patient or authorized representative who has indicated his/her understanding and acceptance.     Dental Advisory Given  Plan Discussed with: CRNA  Anesthesia Plan Comments:        Anesthesia Quick Evaluation

## 2024-02-13 NOTE — Op Note (Signed)
 [facility] Patient Name: Eduardo [name] Procedure Date: 02/13/2024 10:28 AM MRN: [id number] Date of Birth: [birth year] Attending MD: Samantha Cress , , [id number] CSN: [id number] Age: 78 Admit Type: Outpatient Procedure:                Upper GI endoscopy Indications:              Dysphagia Providers:                Samantha Cress, Graydon Lazier RN, RN, Annell Barrow Referring MD:              Medicines:                Monitored Anesthesia Care Complications:            No immediate complications. Estimated Blood Loss:     Estimated blood loss: none. Procedure:                Pre-Anesthesia Assessment:                           - Prior to the procedure, a History and Physical                            was performed, and patient medications, allergies                            and sensitivities were reviewed. The patient's                            tolerance of previous anesthesia was reviewed.                           - The risks and benefits of the procedure and the                            sedation options and risks were discussed with the                            patient. All questions were answered and informed                            consent was obtained.                           - ASA Grade Assessment: II - A patient with mild                            systemic disease.                           After obtaining informed consent, the endoscope was                            passed under direct vision. Throughout the  procedure, the patient's blood pressure, pulse, and                            oxygen  saturations were monitored continuously. The                            GIF-H190 ([id number]) scope was introduced through the                            mouth, and advanced to the second part of duodenum.                            The upper GI endoscopy was accomplished without                             difficulty. The patient tolerated the procedure                            well. Scope In: 10:39:57 AM Scope Out: 10:46:40 AM Total Procedure Duration: 0 hours 6 minutes 43 seconds  Findings:      A non-obstructing Schatzki ring was found at the gastroesophageal       junction. A TTS dilator was passed through the scope. Dilation with an       18-19-20 mm balloon dilator was performed to 20 mm. The dilation site       was examined and showed mild mucosal disruption.      A deformity was found in the gastric body - stomach had a J-shaped       angulation.      The examined duodenum was normal. Impression:               - Non-obstructing Schatzki ring. Dilated.                           - Deformity in the gastric body - stomach had a                            J-shaped angulation.                           - Normal examined duodenum.                           - No specimens collected. Moderate Sedation:      Per Anesthesia Care Recommendation:           - Discharge patient to home (ambulatory).                           - Resume previous diet.                           - Continue present medications. Procedure Code(s):        --- Professional ---                           [phone], Esophagogastroduodenoscopy, flexible,  transoral; with transendoscopic balloon dilation of                            esophagus (less than 30 mm diameter) Diagnosis Code(s):        --- Professional ---                           K22.2, Esophageal obstruction                           K31.89, Other diseases of stomach and duodenum                           R13.10, Dysphagia, unspecified CPT copyright 2022 [employer]. All rights reserved. The codes documented in this report are preliminary and upon coder review may  be revised to meet current compliance requirements. Samantha Cress, MD Samantha Cress,  02/13/2024 10:50:49 AM This report has been signed  electronically. Number of Addenda: 0

## 2024-02-13 NOTE — Discharge Instructions (Signed)
 You are being discharged to home.  Resume your previous diet.  Continue your present medications.

## 2024-02-13 NOTE — H&P (Signed)
 Eduardo [name] is an 78 y.o. male.   Chief Complaint: dysphagia HPI: [place] Eduardo [name] is a 78 y.o. male with past medical history of chronic pain, colon cancer s/p colon resection in 2014, DDD, GERD, kidney stones, neuropathy, osteoarthritis, prostate cancer, skin cancer, who presents for follow up of dysphagia.    Patient still presenting dysphagia to solid food frequently.  No heartburn while taking PPI. The patient denies having any nausea, vomiting, fever, chills, hematochezia, melena, hematemesis, abdominal distention, abdominal pain, diarrhea, jaundice, pruritus or weight loss.   Past Medical History:  Diagnosis Date   Chronic pain syndrome    Colon cancer (HCC) 10/20/13   colon resection-    DDD (degenerative disc disease), cervical    GERD (gastroesophageal reflux disease)    History of kidney stones    seen on imaging but never knowingly passed spontaneously. Never had any manipulation surgery/procedure for it.    History of seasonal allergies    Kidney stone    Kidney stones    Migraine    "maybe 1-2/month" since childhood   Neuropathy 2010   bi lat feet   Primary osteoarthritis    Prostate cancer (HCC)    Skin cancer of face    "froze them off"   Stroke [facility])    pt denies not confirmed   Wears glasses     Past Surgical History:  Procedure Laterality Date   ANTERIOR CERVICAL DECOMP/DISCECTOMY FUSION N/A 09/26/2017   Procedure: ACDF - C3-C4 - C5-C6 - C6-C7;  Surgeon: Agustina Aldrich, MD;  Location: [facility] OR;  Service: Neurosurgery;  Laterality: N/A;   APPENDECTOMY  10/20/13   (with right hemicolectomy)   BIOPSY  06/20/2022   Procedure: BIOPSY;  Surgeon: Urban Garden, MD;  Location: [facility];  Service: Gastroenterology;;   CARPAL TUNNEL RELEASE Left 2011   CARPAL TUNNEL RELEASE Right 01/07/2013   Procedure: CARPAL TUNNEL RELEASE;  Surgeon: Milagros Alf, MD;  Location: [facility];  Service: Orthopedics;  Laterality: Right;   COLON RESECTION N/A  10/20/2013   Procedure:  LAPAROSCOPIC hand assisted partial colectomy;  Surgeon: Beau Bound, MD;  Location: [facility];  Service: General;  Laterality: N/A;   COLON SURGERY  2014   x2- 2nd. to correct a perforation , treated with TPN & drain & then later had to remove more colon    COLONOSCOPY     COLONOSCOPY N/A 10/18/2013   Procedure: COLONOSCOPY;  Surgeon: Alyce Jubilee, MD;  [facility];  Service: Endoscopy;  Laterality: N/A;  2:00-moved to 1030 Pam to notify pt   COLONOSCOPY N/A 09/22/2014   Procedure: COLONOSCOPY;  Surgeon: Ruby Corporal, MD;  [facility];  Service: Endoscopy;  Laterality: N/A;  1200   COLONOSCOPY N/A 11/20/2017   Procedure: COLONOSCOPY;  Surgeon: Ruby Corporal, MD;  [facility];  Service: Endoscopy;  Laterality: N/A;  955   COLONOSCOPY WITH PROPOFOL  N/A 11/20/2022   Procedure: COLONOSCOPY WITH PROPOFOL ;  Surgeon: Urban Garden, MD;  [facility];  Service: Gastroenterology;  Laterality: N/A;  11:00am, asa 2   ESOPHAGEAL DILATION N/A 06/20/2022   Procedure: ESOPHAGEAL DILATION;  Surgeon: Urban Garden, MD;  [facility];  Service: Gastroenterology;  Laterality: N/A;   ESOPHAGOGASTRODUODENOSCOPY N/A 06/19/2016   Procedure: ESOPHAGOGASTRODUODENOSCOPY (EGD);  Surgeon: Ruby Corporal, MD;  [facility];  Service: Endoscopy;  Laterality: N/A;  3:00   ESOPHAGOGASTRODUODENOSCOPY (EGD) WITH PROPOFOL  N/A  06/20/2022   Procedure: ESOPHAGOGASTRODUODENOSCOPY (EGD) WITH PROPOFOL ;  Surgeon: Urban Garden, MD;  [facility];  Service: Gastroenterology;  Laterality: N/A;  815 ASA 2   EVALUATION UNDER ANESTHESIA WITH HEMORRHOIDECTOMY N/A 08/06/2018   Procedure: EXAM UNDER ANESTHESIA WITHHEMORRHOIDECTOMY ERAS PATHWAY;  Surgeon: Sim Dryer, MD;  Location: [facility];  Service: General;  Laterality: N/A;   EXCISIONAL HEMORRHOIDECTOMY     INCISIONAL HERNIA  REPAIR  06/20/2015   WITH MYOFASCIAL ADVANCEMENT FLAP AND MESH    INCISIONAL HERNIA REPAIR N/A 06/20/2015   Procedure: INCISIONAL HERNIA REPAIR WITH MYOFASCIAL ADVANCEMENT FLAP AND MESH;  Surgeon: Sim Dryer, MD;  Location: [facility];  Service: General;  Laterality: N/A;   INGUINAL HERNIA REPAIR Left    "@ [facility]"   INGUINAL HERNIA REPAIR Bilateral 2000   "[place]"   INSERTION OF MESH N/A 06/20/2015   Procedure: INSERTION OF MESH;  Surgeon: Sim Dryer, MD;  [facility];  Service: General;  Laterality: N/A;   KNEE ARTHROSCOPY Bilateral 2001   LYMPHADENECTOMY Bilateral 08/02/2020   Procedure: LYMPHADENECTOMY;  Surgeon: Osborn Blaze, MD;  Location: [facility];  Service: Urology;  Laterality: Bilateral;   NASAL SEPTUM SURGERY  1980's   POLYPECTOMY  11/20/2022   Procedure: POLYPECTOMY INTESTINAL;  Surgeon: Urban Garden, MD;  [facility];  Service: Gastroenterology;;   ROBOT ASSISTED LAPAROSCOPIC RADICAL PROSTATECTOMY N/A 08/02/2020   Procedure: XI ROBOTIC ASSISTED LAPAROSCOPIC RADICAL PROSTATECTOMY, EXTENSIVE LYSIS OF ADHESIONS;  Surgeon: Osborn Blaze, MD;  Location: [facility];  Service: Urology;  Laterality: N/A;  3 HRS   TAKE DOWN OF INTESTINAL FISTULA N/A 02/02/2014   Procedure: TAKE DOWN OF ENTEROCUTANEOUS  FISTULA;  Surgeon: Andy Bannister A. Cornett, MD;  Location: [facility];  Service: General;  Laterality: N/A;   TONSILLECTOMY     UPPER GI ENDOSCOPY      Family History  Problem Relation Age of Onset   Colon polyps Father    Colon cancer Father    Colon polyps Paternal Uncle    Colon polyps Maternal Grandfather    Cancer Maternal Grandfather    Breast cancer Mother    Ovarian cancer Mother    Kidney cancer Mother    Cancer Maternal Uncle    Prostate cancer Neg Hx    Social History:  reports that he has never smoked. He has never been exposed to tobacco smoke. He has never used smokeless tobacco. He reports that he does not drink alcohol and does not use  drugs.  Allergies:  Allergies  Allergen Reactions   Niaspan [Niacin Er (Antihyperlipidemic)] Rash   Tape Rash and Other (See Comments)    Paper tape OK    Medications Prior to Admission  Medication Sig Dispense Refill   clotrimazole-betamethasone (LOTRISONE) cream APPLY EXTERNALLY TO THE AFFECTED AND SURROUNDING AREAS TWICE DAILY IN THE MORNING AND IN THE EVENING     famotidine  (PEPCID ) 20 MG tablet Take 1 tablet (20 mg total) by mouth at bedtime. 90 tablet 3   fluticasone  (FLONASE ) 50 MCG/ACT nasal spray Place 2 sprays into both nostrils daily as needed for allergies.      HYDROcodone -acetaminophen  (NORCO/VICODIN) 5-325 MG tablet Take 1-2 tablets by mouth every 6 (six) hours as needed for moderate pain or severe pain. 20 tablet 0   omeprazole  (PRILOSEC) 40 MG capsule Take 1 capsule (40 mg total) by mouth daily. 60 capsule 1   rizatriptan (MAXALT) 10 MG tablet Take 10 mg by mouth as needed for migraine.  May repeat in 2 hours if needed     EPINEPHrine  0.3 mg/0.3 mL IJ SOAJ injection Inject 0.3 mLs (0.3 mg total) into the muscle once. 1 Device 0   naproxen  (NAPROSYN ) 500 MG tablet Take 1 tablet (500 mg total) by mouth 2 (two) times daily. 30 tablet 0   OVER THE COUNTER MEDICATION Probiotic     oxyCODONE -acetaminophen  (PERCOCET) 5-325 MG tablet Take 1-2 tablets by mouth every 8 (eight) hours as needed for severe pain (pain score 7-10). 10 tablet 0   oxyCODONE -acetaminophen  (PERCOCET/ROXICET) 5-325 MG tablet Take 1 tablet by mouth every 6 (six) hours as needed for severe pain (pain score 7-10). 6 tablet 0   ranitidine (ZANTAC) 300 MG tablet Take 300 mg by mouth daily as needed for heartburn.     tizanidine  (ZANAFLEX ) 2 MG capsule Take 1 capsule (2 mg total) by mouth 3 (three) times daily. Do not drink alcohol or drive while taking this medication.  May cause drowsiness. 15 capsule 0    No results found for this or any previous visit (from the past 48 hours). No results found.  Review of  Systems  HENT:  Positive for trouble swallowing.   All other systems reviewed and are negative.   Blood pressure (!) 149/88, pulse 69, temperature 97.8 F (36.6 C), temperature source Oral, resp. rate 19, height 6\' 2"  (1.88 m), weight 95.3 kg, SpO2 95%. Physical Exam  GENERAL: The patient is AO x3, in no acute distress. HEENT: Head is normocephalic and atraumatic. EOMI are intact. Mouth is well hydrated and without lesions. NECK: Supple. No masses LUNGS: Clear to auscultation. No presence of rhonchi/wheezing/rales. Adequate chest expansion HEART: RRR, normal s1 and s2. ABDOMEN: Soft, nontender, no guarding, no peritoneal signs, and nondistended. BS +. No masses. EXTREMITIES: Without any cyanosis, clubbing, rash, lesions or edema. NEUROLOGIC: AOx3, no focal motor deficit. SKIN: no jaundice, no rashes  Assessment/Plan SAMSON RALPH is a 78 y.o. male with past medical history of chronic pain, colon cancer s/p colon resection in 2014, DDD, GERD, kidney stones, neuropathy, osteoarthritis, prostate cancer, skin cancer, who presents for follow up of dysphagia.  Will proceed with EGD.   Urban Garden, MD 02/13/2024, 9:50 AM

## 2024-02-16 ENCOUNTER — Encounter (HOSPITAL_COMMUNITY): Payer: Self-pay | Admitting: Gastroenterology

## 2024-02-17 ENCOUNTER — Emergency Department (HOSPITAL_COMMUNITY)

## 2024-02-17 ENCOUNTER — Encounter (HOSPITAL_COMMUNITY): Payer: Self-pay | Admitting: *Deleted

## 2024-02-17 ENCOUNTER — Other Ambulatory Visit: Payer: Self-pay

## 2024-02-17 ENCOUNTER — Emergency Department (HOSPITAL_COMMUNITY)
Admission: EM | Admit: 2024-02-17 | Discharge: 2024-02-17 | Disposition: A | Discharge status: Home / Self Care | Attending: Emergency Medicine | Admitting: Emergency Medicine

## 2024-02-17 DIAGNOSIS — M25561 Pain in right knee: Secondary | ICD-10-CM | POA: Diagnosis not present

## 2024-02-17 DIAGNOSIS — E876 Hypokalemia: Secondary | ICD-10-CM | POA: Diagnosis not present

## 2024-02-17 DIAGNOSIS — Z85038 Personal history of other malignant neoplasm of large intestine: Secondary | ICD-10-CM | POA: Insufficient documentation

## 2024-02-17 DIAGNOSIS — M25551 Pain in right hip: Secondary | ICD-10-CM | POA: Insufficient documentation

## 2024-02-17 DIAGNOSIS — W19XXXA Unspecified fall, initial encounter: Secondary | ICD-10-CM

## 2024-02-17 DIAGNOSIS — M25552 Pain in left hip: Secondary | ICD-10-CM | POA: Diagnosis not present

## 2024-02-17 DIAGNOSIS — Z8546 Personal history of malignant neoplasm of prostate: Secondary | ICD-10-CM | POA: Diagnosis not present

## 2024-02-17 DIAGNOSIS — X501XXA Overexertion from prolonged static or awkward postures, initial encounter: Secondary | ICD-10-CM | POA: Diagnosis not present

## 2024-02-17 LAB — CBC WITH DIFFERENTIAL/PLATELET
Abs Immature Granulocytes: 0.01 10*3/uL (ref 0.00–0.07)
Basophils Absolute: 0 10*3/uL (ref 0.0–0.1)
Basophils Relative: 0 %
Eosinophils Absolute: 0 10*3/uL (ref 0.0–0.5)
Eosinophils Relative: 1 %
HCT: 37.2 % — ABNORMAL LOW (ref 39.0–52.0)
Hemoglobin: 12.8 g/dL — ABNORMAL LOW (ref 13.0–17.0)
Immature Granulocytes: 0 %
Lymphocytes Relative: 9 %
Lymphs Abs: 0.8 10*3/uL (ref 0.7–4.0)
MCH: 31 pg (ref 26.0–34.0)
MCHC: 34.4 g/dL (ref 30.0–36.0)
MCV: 90.1 fL (ref 80.0–100.0)
Monocytes Absolute: 1.2 10*3/uL — ABNORMAL HIGH (ref 0.1–1.0)
Monocytes Relative: 14 %
Neutro Abs: 6.7 10*3/uL (ref 1.7–7.7)
Neutrophils Relative %: 76 %
Platelets: 222 10*3/uL (ref 150–400)
RBC: 4.13 MIL/uL — ABNORMAL LOW (ref 4.22–5.81)
RDW: 13.6 % (ref 11.5–15.5)
WBC: 8.8 10*3/uL (ref 4.0–10.5)
nRBC: 0 % (ref 0.0–0.2)

## 2024-02-17 LAB — BASIC METABOLIC PANEL WITH GFR
Anion gap: 10 (ref 5–15)
BUN: 9 mg/dL (ref 8–23)
CO2: 25 mmol/L (ref 22–32)
Calcium: 8.8 mg/dL — ABNORMAL LOW (ref 8.9–10.3)
Chloride: 104 mmol/L (ref 98–111)
Creatinine, Ser: 1.06 mg/dL (ref 0.61–1.24)
GFR, Estimated: >60 mL/min (ref >60)
Glucose, Bld: 102 mg/dL — ABNORMAL HIGH (ref 70–99)
Potassium: 3 mmol/L — ABNORMAL LOW (ref 3.5–5.1)
Sodium: 139 mmol/L (ref 135–145)

## 2024-02-17 MED ORDER — FENTANYL CITRATE PF 50 MCG/ML IJ SOSY
50.0000 ug | PREFILLED_SYRINGE | Freq: Once | INTRAMUSCULAR | Status: AC
  Administered 2024-02-17: 50 ug via INTRAVENOUS
  Filled 2024-02-17: qty 1

## 2024-02-17 MED ORDER — HYDROCODONE-ACETAMINOPHEN 5-325 MG PO TABS
2.0000 | ORAL_TABLET | Freq: Once | ORAL | Status: AC
  Administered 2024-02-17: 2 via ORAL
  Filled 2024-02-17: qty 2

## 2024-02-17 MED ORDER — POTASSIUM CHLORIDE CRYS ER 20 MEQ PO TBCR
40.0000 meq | EXTENDED_RELEASE_TABLET | Freq: Once | ORAL | Status: AC
  Administered 2024-02-17: 40 meq via ORAL
  Filled 2024-02-17: qty 2

## 2024-02-17 NOTE — ED Triage Notes (Signed)
 Pt states he fell x 2 days ago while trying to pick up something off the ground  Pt fell on his left hip but states he heard a "pop" in his hip  Pt states both hips hurt but right hurts worse  Pt unable to bear weight on right leg

## 2024-02-17 NOTE — Discharge Instructions (Signed)
 You were seen in the emergency department for bilateral hip pain after a fall.  You had x-rays and CAT scan of your hips that did not show an obvious explanation for your symptoms.  Please continue pain medication and ice or heat to the area as tolerated.  Follow-up with your primary care doctor and orthopedics.  Return if any worsening or concerning symptoms.

## 2024-02-17 NOTE — ED Provider Notes (Signed)
 [facility] EMERGENCY DEPARTMENT AT [facility] Provider Note   CSN: [id number] Arrival date & time: 02/17/24  0736     History  Chief Complaint  Patient presents with   Hip Pain   Fall    Eduardo [name] is a 78 y.o. male.  He has a history of prostate cancer colon cancer.  He was bending down to pick up something 2 days ago when he heard a pop in his right hip and had severe pain that caused him to fall landing on his left hip.  He has had significant pain in both of his hips since then.  He was able to walk until today.  Pain radiates down to his knees bilaterally.  He has chronic neuropathy numbness.  Denies any head or neck injury no chest pain or shortness of breath.  No abdominal pain.  The history is provided by the patient and the spouse.  Hip Pain This is a new problem. The current episode started 2 days ago. The problem occurs constantly. The problem has not changed since onset.Pertinent negatives include no chest pain, no abdominal pain and no shortness of breath. The symptoms are aggravated by bending, twisting and walking. Nothing relieves the symptoms. He has tried rest for the symptoms. The treatment provided no relief.       Home Medications Prior to Admission medications   Medication Sig Start Date End Date Taking? Authorizing Provider  clotrimazole-betamethasone (LOTRISONE) cream APPLY EXTERNALLY TO THE AFFECTED AND SURROUNDING AREAS TWICE DAILY IN THE MORNING AND IN THE EVENING 12/22/19   [provider]  EPINEPHrine  0.3 mg/0.3 mL IJ SOAJ injection Inject 0.3 mLs (0.3 mg total) into the muscle once. 07/16/15   Rancour, Mara Seminole, MD  famotidine  (PEPCID ) 20 MG tablet Take 1 tablet (20 mg total) by mouth at bedtime. 09/19/22   Carlan, Chelsea L, NP  fluticasone  (FLONASE ) 50 MCG/ACT nasal spray Place 2 sprays into both nostrils daily as needed for allergies.  09/28/13   [provider]  HYDROcodone -acetaminophen  (NORCO/VICODIN) 5-325 MG tablet Take  1-2 tablets by mouth every 6 (six) hours as needed for moderate pain or severe pain. 08/02/20   Carrolyn Clan, PA-C  naproxen  (NAPROSYN ) 500 MG tablet Take 1 tablet (500 mg total) by mouth 2 (two) times daily. 12/15/23   Mesner, Reymundo Caulk, MD  omeprazole  (PRILOSEC) 40 MG capsule Take 1 capsule (40 mg total) by mouth daily. 06/17/22   Carlan, Fidel Huddle, NP  OVER THE COUNTER MEDICATION Probiotic    [provider]  oxyCODONE -acetaminophen  (PERCOCET) 5-325 MG tablet Take 1-2 tablets by mouth every 8 (eight) hours as needed for severe pain (pain score 7-10). 12/15/23   Mesner, Reymundo Caulk, MD  oxyCODONE -acetaminophen  (PERCOCET/ROXICET) 5-325 MG tablet Take 1 tablet by mouth every 6 (six) hours as needed for severe pain (pain score 7-10). 12/15/23   Mesner, Jason, MD  ranitidine (ZANTAC) 300 MG tablet Take 300 mg by mouth daily as needed for heartburn.    [provider]  rizatriptan (MAXALT) 10 MG tablet Take 10 mg by mouth as needed for migraine. May repeat in 2 hours if needed    [provider]  tizanidine  (ZANAFLEX ) 2 MG capsule Take 1 capsule (2 mg total) by mouth 3 (three) times daily. Do not drink alcohol or drive while taking this medication.  May cause drowsiness. 08/05/23   Corbin Dess, PA-C      Allergies    Niaspan [niacin er (antihyperlipidemic)] and Tape    Review  of Systems   Review of Systems  Constitutional:  Negative for fever.  HENT:  Negative for sore throat.   Respiratory:  Negative for shortness of breath.   Cardiovascular:  Negative for chest pain.  Gastrointestinal:  Negative for abdominal pain.  Genitourinary:  Negative for dysuria.  Skin:  Negative for rash.    Physical Exam Updated Vital Signs BP (!) 144/91   Pulse 85   Temp 97.9 F (36.6 C) (Oral)   Resp (!) 25   Ht 6\' 2"  (1.88 m)   Wt 95.2 kg   SpO2 95%   BMI 26.95 kg/m  Physical Exam Vitals and nursing note reviewed.  Constitutional:      General: He is not in acute distress.     Appearance: Normal appearance. He is well-developed.  HENT:     Head: Normocephalic and atraumatic.  Eyes:     Conjunctiva/sclera: Conjunctivae normal.  Cardiovascular:     Rate and Rhythm: Normal rate and regular rhythm.     Heart sounds: No murmur heard. Pulmonary:     Effort: Pulmonary effort is normal. No respiratory distress.     Breath sounds: Normal breath sounds.  Abdominal:     Palpations: Abdomen is soft.     Tenderness: There is no abdominal tenderness.  Musculoskeletal:        General: Tenderness present. No deformity.     Cervical back: Neck supple.     Comments: He has pain with palpation and range of motion of both hips.  Diffuse pain around the right knee.  Distal pulses intact.  No shortening or rotation.  Skin:    General: Skin is warm and dry.     Capillary Refill: Capillary refill takes less than 2 seconds.  Neurological:     General: No focal deficit present.     Mental Status: He is alert.     Motor: No weakness.     ED Results / Procedures / Treatments   Labs (all labs ordered are listed, but only abnormal results are displayed) Labs Reviewed  BASIC METABOLIC PANEL WITH GFR - Abnormal; Notable for the following components:      Result Value   Potassium 3.0 (*)    Glucose, Bld 102 (*)    Calcium 8.8 (*)    All other components within normal limits  CBC WITH DIFFERENTIAL/PLATELET - Abnormal; Notable for the following components:   RBC 4.13 (*)    Hemoglobin 12.8 (*)    HCT 37.2 (*)    Monocytes Absolute 1.2 (*)    All other components within normal limits  URINALYSIS, ROUTINE W REFLEX MICROSCOPIC    EKG None  Radiology CT PELVIS WO CONTRAST Result Date: 02/17/2024 CLINICAL DATA:  Hip trauma, fracture suspected, xray done R>L hip pain EXAM: CT PELVIS WITHOUT CONTRAST TECHNIQUE: Multidetector CT imaging of the pelvis was performed following the standard protocol without intravenous contrast. RADIATION DOSE REDUCTION: This exam was performed  according to the departmental dose-optimization program which includes automated exposure control, adjustment of the mA and/or kV according to patient size and/or use of iterative reconstruction technique. COMPARISON:  November 12, 2022, February 17, 2024 FINDINGS: Urinary Tract: No hydronephrosis. The urinary bladder is distended without focal abnormality. Bowel: No small bowel wall thickening or inflammation. No small bowel obstruction. Vascular/Lymphatic: No aortic aneurysm. Diffuse aortoiliac atherosclerosis. No intraabdominal or pelvic lymphadenopathy. Reproductive: Prostatectomy.No free pelvic fluid. Other: No pneumoperitoneum, ascites, or mesenteric inflammation. Musculoskeletal: Osteopenia. No acute fracture or destructive lesion.Bony sequelae of osteonecrosis  of both femoral heads, larger on the right than the left, without subchondral collapse. Bone islands in the right femoral neck. Multilevel degenerative disc disease of the spine. Bilateral inguinal hernia mesh. No associated fluid collection. IMPRESSION: No acute fracture or dislocation. Otherwise, no acute abnormality in the pelvis. Electronically Signed   By: Rance Burrows M.D.   On: 02/17/2024 10:18   DG Knee Complete 4 Views Right Result Date: 02/17/2024 CLINICAL DATA:  fall hip pain unable to bear weight on right leg EXAM: RIGHT KNEE - COMPLETE 4+ VIEW COMPARISON:  None Available. FINDINGS: No acute fracture or dislocation. No joint effusion. Mild joint space loss of the medial and patellofemoral compartments. Soft tissues are unremarkable. IMPRESSION: 1. No acute fracture or dislocation. 2. Mild osteoarthritis of the right knee. Electronically Signed   By: Rance Burrows M.D.   On: 02/17/2024 08:44   DG Hip Unilat With Pelvis 2-3 Views Right Result Date: 02/17/2024 CLINICAL DATA:  fall hip pain EXAM: DG HIP (WITH OR WITHOUT PELVIS) 2-3V RIGHT COMPARISON:  November 12, 2022 FINDINGS: No acute fracture or dislocation. There is no evidence of  arthropathy or other focal bone abnormality. Soft tissues are unremarkable. Osteonecrosis again noted in the right femoral head. Femoral neck bone islands. Herniorrhaphy mesh markers IMPRESSION: No acute fracture or dislocation. Electronically Signed   By: Rance Burrows M.D.   On: 02/17/2024 08:42   DG Hip Unilat With Pelvis 2-3 Views Left Result Date: 02/17/2024 CLINICAL DATA:  Fall, hip pain EXAM: DG HIP (WITH OR WITHOUT PELVIS) 2-3V LEFT COMPARISON:  November 12, 2022 FINDINGS: No acute fracture or dislocation. There is no evidence of arthropathy or other focal bone abnormality. Soft tissues are unremarkable. Osteonecrosis of both femoral heads. Herniorrhaphy mesh markers. Bone islands in the right femoral neck. Degenerative disc disease of the lumbosacral junction. IMPRESSION: No acute fracture or dislocation. Electronically Signed   By: Rance Burrows M.D.   On: 02/17/2024 08:40    Procedures Procedures    Medications Ordered in ED Medications  fentaNYL  (SUBLIMAZE ) injection 50 mcg (50 mcg Intravenous Given 02/17/24 0814)  HYDROcodone -acetaminophen  (NORCO/VICODIN) 5-325 MG per tablet 2 tablet (2 tablets Oral Given 02/17/24 1117)  potassium chloride  SA (KLOR-CON  M) CR tablet 40 mEq (40 mEq Oral Given 02/17/24 1238)    ED Course/ Medical Decision Making/ A&P Clinical Course as of 02/17/24 1632  Tue Feb 17, 2024  1210 Patient was able to ambulate in the department without significant difficulty. [MB]  1230 Patient states he has enough pain medicine at home.  Will give him outpatient Ortho follow-up.  Return instructions discussed [MB]    Clinical Course User Index [MB] Tonya Fredrickson, MD                                 Medical Decision Making Amount and/or Complexity of Data Reviewed Labs: ordered. Radiology: ordered.  Risk Prescription drug management.   This patient complains of pain in both of his hips and right knee; this involves an extensive number of treatment Options  and is a complaint that carries with it a high risk of complications and morbidity. The differential includes fracture, dislocation, muscle strain, septic joint, stress fracture  I ordered, reviewed and interpreted labs, which included CBC with normal white count stable hemoglobin, chemistries with mildly low potassium I ordered medication IV and oral pain medicine and reviewed PMP when indicated. I ordered imaging studies which included  x-rays of hips bilaterally and right knee, CT pelvis and I independently    visualized and interpreted imaging which showed no acute findings Additional history obtained from patient's wife Previous records obtained and reviewed in epic no recent admissions Cardiac monitoring reviewed, sinus rhythm Social determinants considered, no significant barriers Critical Interventions: None  After the interventions stated above, I reevaluated the patient and found patient to be able to ambulate in the department Admission and further testing considered, he feels able to manage his symptoms at home.  He said he already has pain medicine at home.  Recommended close follow-up with PCP and given contact information for orthopedics.  Return instructions discussed         Final Clinical Impression(s) / ED Diagnoses Final diagnoses:  Fall, initial encounter  Bilateral hip pain  Hypokalemia    Rx / DC Orders ED Discharge Orders     None         Tonya Fredrickson, MD 02/17/24 [id number]

## 2024-02-17 NOTE — ED Notes (Signed)
Pt ambulated in room without assistance 

## 2024-02-20 ENCOUNTER — Encounter: Payer: Self-pay | Admitting: Orthopedic Surgery

## 2024-02-20 ENCOUNTER — Ambulatory Visit (INDEPENDENT_AMBULATORY_CARE_PROVIDER_SITE_OTHER): Admitting: Orthopedic Surgery

## 2024-02-20 VITALS — BP 114/77 | HR 80 | Wt 205.0 lb

## 2024-02-20 DIAGNOSIS — M545 Low back pain, unspecified: Secondary | ICD-10-CM | POA: Diagnosis not present

## 2024-02-20 DIAGNOSIS — M87052 Idiopathic aseptic necrosis of left femur: Secondary | ICD-10-CM | POA: Diagnosis not present

## 2024-02-20 DIAGNOSIS — M87051 Idiopathic aseptic necrosis of right femur: Secondary | ICD-10-CM | POA: Diagnosis not present

## 2024-02-20 MED ORDER — PREDNISONE 10 MG (21) PO TBPK: ORAL_TABLET | ORAL | 0 refills | Status: DC

## 2024-02-20 NOTE — Patient Instructions (Signed)
 Avascular necrosis of both hips - found on XR and CT scan

## 2024-02-20 NOTE — Progress Notes (Signed)
 New Patient Visit  Assessment: Eduardo [name] is a 78 y.o. male with the following: 1. Acute bilateral low back pain without sciatica 2. Avascular necrosis of left femoral head (HCC) 3. Avascular necrosis of right femoral head (HCC)  Plan: Eduardo [name] has severe, debilitating pain in his lower back after a fall a few days ago.  He has been using a cane since falling while removing the deck of his mower to change the blades.  He has tenderness in the lower back, a lot of muscle spasms.  I have recommended a prednisone  dose pack to improve his current pain.  Incidentally, he has AVN in bilateral femoral heads.  There is no obvious collapse.  This is visualized on plain radiographs, as well as a CT scan.  This does not appear to be symptomatic at this time.  Will continue to monitor.  If prednisone  is not effective at improving his pain, would recommend lumbar spine x-rays at the next visit.   Follow-up: Return in about 3 weeks (around 03/12/2024).  Subjective:  Chief Complaint  Patient presents with   Hip Pain    Bilatal hip pain- fell Saturday and right hip popped and fell on the left hip, Left hip hurts worse than right.     History of Present Illness: Eduardo [name] is a 78 y.o. male who presents for evaluation of low back pain.  He states that he was changing the deck on his mower, in order to change the blades, when he lost his balance and fell.  He felt a pop in the left side of his lower back.  Pain progressively worsened.  Since the initial injury, he has had pains in bilateral sides of his lower back, and into the buttock.  No pain in his groin.  He is currently using a cane.  He has been given some pain medicine, after being evaluated in the emergency department.  This is not helping with his pain.   Review of Systems: No fevers or chills No numbness or tingling No chest pain No shortness of breath No bowel or bladder dysfunction No GI distress No headaches   Medical  History:  Past Medical History:  Diagnosis Date   Chronic pain syndrome    Colon cancer (HCC) 10/20/13   colon resection-    DDD (degenerative disc disease), cervical    GERD (gastroesophageal reflux disease)    History of kidney stones    seen on imaging but never knowingly passed spontaneously. Never had any manipulation surgery/procedure for it.    History of seasonal allergies    Kidney stone    Kidney stones    Migraine    "maybe 1-2/month" since childhood   Neuropathy 2010   bi lat feet   Primary osteoarthritis    Prostate cancer (HCC)    Skin cancer of face    "froze them off"   Stroke [facility])    pt denies not confirmed   Wears glasses     Past Surgical History:  Procedure Laterality Date   ANTERIOR CERVICAL DECOMP/DISCECTOMY FUSION N/A 09/26/2017   Procedure: ACDF - C3-C4 - C5-C6 - C6-C7;  Surgeon: Agustina Aldrich, MD;  Location: [facility] OR;  Service: Neurosurgery;  Laterality: N/A;   APPENDECTOMY  10/20/13   (with right hemicolectomy)   BIOPSY  06/20/2022   Procedure: BIOPSY;  Surgeon: Urban Garden, MD;  Location: [facility];  Service: Gastroenterology;;   CARPAL TUNNEL RELEASE Left 2011   CARPAL TUNNEL RELEASE  Right 01/07/2013   Procedure: CARPAL TUNNEL RELEASE;  Surgeon: Milagros Alf, MD;  Location: [facility];  Service: Orthopedics;  Laterality: Right;   COLON RESECTION N/A 10/20/2013   Procedure:  LAPAROSCOPIC hand assisted partial colectomy;  Surgeon: Beau Bound, MD;  Location: [facility] ORS;  Service: General;  Laterality: N/A;   COLON SURGERY  2014   x2- 2nd. to correct a perforation , treated with TPN & drain & then later had to remove more colon    COLONOSCOPY     COLONOSCOPY N/A 10/18/2013   Procedure: COLONOSCOPY;  Surgeon: Alyce Jubilee, MD;  [facility]: [facility];  Service: Endoscopy;  Laterality: N/A;  2:00-moved to 1030 Pam to notify pt   COLONOSCOPY N/A 09/22/2014   Procedure: COLONOSCOPY;  Surgeon: Ruby Corporal, MD;   Location: [facility];  Service: Endoscopy;  Laterality: N/A;  1200   COLONOSCOPY N/A 11/20/2017   Procedure: COLONOSCOPY;  Surgeon: Ruby Corporal, MD;  [facility]: [facility];  Service: Endoscopy;  Laterality: N/A;  955   COLONOSCOPY WITH PROPOFOL  N/A 11/20/2022   Procedure: COLONOSCOPY WITH PROPOFOL ;  Surgeon: Urban Garden, MD;  [facility];  Service: Gastroenterology;  Laterality: N/A;  11:00am, asa 2   ESOPHAGEAL DILATION N/A 06/20/2022   Procedure: ESOPHAGEAL DILATION;  Surgeon: Urban Garden, MD;  [facility];  Service: Gastroenterology;  Laterality: N/A;   ESOPHAGOGASTRODUODENOSCOPY N/A 06/19/2016   Procedure: ESOPHAGOGASTRODUODENOSCOPY (EGD);  Surgeon: Ruby Corporal, MD;  [facility];  Service: Endoscopy;  Laterality: N/A;  3:00   ESOPHAGOGASTRODUODENOSCOPY N/A 02/13/2024   Procedure: EGD (ESOPHAGOGASTRODUODENOSCOPY);  Surgeon: Umberto Ganong, Bearl Limes, MD;  [facility]: [facility];  Service: Gastroenterology;  Laterality: N/A;  10:45AM;ASA 1-2   ESOPHAGOGASTRODUODENOSCOPY (EGD) WITH PROPOFOL  N/A 06/20/2022   Procedure: ESOPHAGOGASTRODUODENOSCOPY (EGD) WITH PROPOFOL ;  Surgeon: Urban Garden, MD;  [facility];  Service: Gastroenterology;  Laterality: N/A;  815 ASA 2   EVALUATION UNDER ANESTHESIA WITH HEMORRHOIDECTOMY N/A 08/06/2018   Procedure: EXAM UNDER ANESTHESIA WITHHEMORRHOIDECTOMY ERAS PATHWAY;  Surgeon: Sim Dryer, MD;  Location: [facility];  Service: General;  Laterality: N/A;   EXCISIONAL HEMORRHOIDECTOMY     INCISIONAL HERNIA REPAIR  06/20/2015   WITH MYOFASCIAL ADVANCEMENT FLAP AND MESH    INCISIONAL HERNIA REPAIR N/A 06/20/2015   Procedure: INCISIONAL HERNIA REPAIR WITH MYOFASCIAL ADVANCEMENT FLAP AND MESH;  Surgeon: Sim Dryer, MD;  Location: [facility];  Service: General;  Laterality: N/A;   INGUINAL HERNIA REPAIR Left    "@ [facility]"   INGUINAL HERNIA REPAIR Bilateral  2000   "[place]"   INSERTION OF MESH N/A 06/20/2015   Procedure: INSERTION OF MESH;  Surgeon: Sim Dryer, MD;  Location: [facility] OR;  Service: General;  Laterality: N/A;   KNEE ARTHROSCOPY Bilateral 2001   LYMPHADENECTOMY Bilateral 08/02/2020   Procedure: LYMPHADENECTOMY;  Surgeon: Osborn Blaze, MD;  Location: [facility];  Service: Urology;  Laterality: Bilateral;   NASAL SEPTUM SURGERY  1980's   POLYPECTOMY  11/20/2022   Procedure: POLYPECTOMY INTESTINAL;  Surgeon: Urban Garden, MD;  [facility];  Service: Gastroenterology;;   ROBOT ASSISTED LAPAROSCOPIC RADICAL PROSTATECTOMY N/A 08/02/2020   Procedure: XI ROBOTIC ASSISTED LAPAROSCOPIC RADICAL PROSTATECTOMY, EXTENSIVE LYSIS OF ADHESIONS;  Surgeon: Osborn Blaze, MD;  Location: [facility];  Service: Urology;  Laterality: N/A;  3 HRS   TAKE DOWN OF INTESTINAL FISTULA N/A 02/02/2014   Procedure: TAKE DOWN OF ENTEROCUTANEOUS  FISTULA;  Surgeon: Brandy Cal. Cornett, MD;  Location: [facility] OR;  Service: General;  Laterality: N/A;   TONSILLECTOMY     UPPER GI ENDOSCOPY      Family History  Problem Relation Age of Onset   Colon polyps Father    Colon cancer Father    Colon polyps Paternal Uncle    Colon polyps Maternal Grandfather    Cancer Maternal Grandfather    Breast cancer Mother    Ovarian cancer Mother    Kidney cancer Mother    Cancer Maternal Uncle    Prostate cancer Neg Hx    Social History   Tobacco Use   Smoking status: Never    Passive exposure: Never   Smokeless tobacco: Never  Vaping Use   Vaping status: Never Used  Substance Use Topics   Alcohol use: No   Drug use: No    Allergies  Allergen Reactions   Niaspan [Niacin Er (Antihyperlipidemic)] Rash   Tape Rash and Other (See Comments)    Paper tape OK    Current Meds  Medication Sig   predniSONE  (STERAPRED UNI-PAK 21 TAB) 10 MG (21) TBPK tablet 10 mg DS 12 as directed    Objective: BP 114/77   Pulse 80   Wt 205 lb (93 kg)   BMI 26.32  kg/m   Physical Exam:  General: Elderly male. and Alert and oriented. Gait: Ambulates with the assistance of a cane.  Movements are slow and deliberate.  He has a lot of difficulty getting up from a seated position.  He is tenderness across the lower back.  Negative straight leg raise bilaterally.  Sensations intact distally.  No pain in the groin with internal or external rotation of bilateral hips.  IMAGING: I personally reviewed images previously obtained from the ED  X-rays of bilateral hips are available in clinic today.  Minimal degenerative changes.  Evidence of bilateral AVN, without collapse.  This is corroborated on the CT scan, which is negative for acute fracture.  Lower portion of the lumbar spine is visualized.  There are some degenerative changes, without acute injury.   New Medications:  Meds ordered this encounter  Medications   predniSONE  (STERAPRED UNI-PAK 21 TAB) 10 MG (21) TBPK tablet    Sig: 10 mg DS 12 as directed    Dispense:  48 tablet    Refill:  0      Tonita Frater, MD  02/20/2024 11:28 PM

## 2024-03-12 ENCOUNTER — Encounter: Payer: Self-pay | Admitting: Orthopedic Surgery

## 2024-03-12 ENCOUNTER — Ambulatory Visit (INDEPENDENT_AMBULATORY_CARE_PROVIDER_SITE_OTHER): Admitting: Orthopedic Surgery

## 2024-03-12 DIAGNOSIS — M5431 Sciatica, right side: Secondary | ICD-10-CM

## 2024-03-12 DIAGNOSIS — M87051 Idiopathic aseptic necrosis of right femur: Secondary | ICD-10-CM

## 2024-03-12 MED ORDER — GABAPENTIN 100 MG PO CAPS: 100.0000 mg | ORAL_CAPSULE | Freq: Three times a day (TID) | ORAL | 0 refills | Status: DC

## 2024-03-12 MED ORDER — METHOCARBAMOL 500 MG PO TABS: 500.0000 mg | ORAL_TABLET | Freq: Three times a day (TID) | ORAL | 0 refills | Status: DC | PRN

## 2024-03-12 NOTE — Progress Notes (Signed)
 New Patient Visit  Assessment: Eduardo [name] is a 78 y.o. male with the following: 1. Avascular necrosis of right femoral head (HCC) 2.  Right-sided sciatica   Plan: [name] continues to have a lot of discomfort, but his pain has shifted.  Pain is exclusively within the right buttock.  Prednisone  improves some of the surrounding muscular pain, but the pain is still deep within the right buttock.  No tenderness to palpation over the greater trochanter laterally.  We reviewed imaging again in clinic today, which demonstrates AVN of the right femoral head, without collapse.  Given the presence of AVN, we discussed the possibility of proceeding with an ultrasound-guided injection of the right hip.  He elected to proceed.  In addition, I provided him with a prescription for gabapentin , as well as Robaxin .  If he continues to have issues after the injection, we should consider a referral to Dr. Vaughn Georges for possible injection surrounding the sciatic nerve in the right buttock.  He states understanding.  I would like see him back in clinic in a couple of weeks.  Procedure note injection - Right hip, ultrasound guidance   Verbal consent was obtained to inject the Right hip joint  Timeout was completed to confirm the site of injection.   Using the ultrasound, the femoral neck was identified.  The joint space was also identified. The skin was prepped with alcohol and ethyl chloride was sprayed at the injection site.  A 21-gauge needle was used to inject 40 mg of Depo-Medrol  and 1% lidocaine  (4 cc) into the hip joint of the Right hip using a direct anterior approach.  The needle was visualized entering the hip joint, and the medication was also visualized. There were no complications.  A sterile bandage was applied.   Note: In order to accurately identify the placement of the needle, ultrasound was required, to increase the accuracy, and specificity of the injection.    Follow-up: No follow-ups on  file.  Subjective:  Chief Complaint  Patient presents with   Hip Pain    R > L getting worse since last visit     History of Present Illness: Eduardo [name] is a 78 y.o. male who returns for evaluation of right-sided low back pain.  He injured his back 2-3 weeks ago, while changing the deck on his mower.  I saw him in clinic, and recommended prednisone  Dosepak.  He has completed the medicines.  He thinks some of the muscular pain surrounding the right hip and buttock has improved.  However, he still has severe pain deep within the right buttock.  Occasionally, it will radiate distally.  No numbness or tingling.  He denies pain in the anterior aspect of the hip, as well as within the groin.  He continues to use a cane to assist with ambulation.   Review of Systems: No fevers or chills No numbness or tingling No chest pain No shortness of breath No bowel or bladder dysfunction No GI distress No headaches      Objective: There were no vitals taken for this visit.  Physical Exam:  General: Elderly male. and Alert and oriented. Gait: Ambulates with the assistance of a cane.  Movements are slow and deliberate.  He has a lot of difficulty getting up from a seated position.  No tenderness palpation of the greater trochanter on the right.  Tenderness within the right buttock.  Negative straight leg raise.  IMAGING: I personally reviewed images previously obtained from  the ED    New Medications:  No orders of the defined types were placed in this encounter.     Tonita Frater, MD  03/12/2024 10:18 AM

## 2024-03-12 NOTE — Patient Instructions (Signed)

## 2024-04-05 ENCOUNTER — Ambulatory Visit (INDEPENDENT_AMBULATORY_CARE_PROVIDER_SITE_OTHER): Admitting: Orthopedic Surgery

## 2024-04-05 ENCOUNTER — Other Ambulatory Visit (INDEPENDENT_AMBULATORY_CARE_PROVIDER_SITE_OTHER)

## 2024-04-05 DIAGNOSIS — M545 Low back pain, unspecified: Secondary | ICD-10-CM

## 2024-04-05 DIAGNOSIS — M7061 Trochanteric bursitis, right hip: Secondary | ICD-10-CM

## 2024-04-05 DIAGNOSIS — M8700 Idiopathic aseptic necrosis of unspecified bone: Secondary | ICD-10-CM

## 2024-04-05 MED ORDER — METHYLPREDNISOLONE ACETATE 40 MG/ML IJ SUSP
40.0000 mg | Freq: Once | INTRAMUSCULAR | Status: AC
  Administered 2024-04-05: 40 mg via INTRA_ARTICULAR

## 2024-04-05 NOTE — Progress Notes (Signed)
 Chief Complaint  Patient presents with   Follow-up    Recheck on right hip pain    Dr Ernesta Heading following for avascular necrosis, status post intra-articular injection ultrasound-guided right hip  Complains of lower back pain right buttock pain worse with sitting, writing, mowing the lawn is sitting riding lawn more  Denies any groin pain  Complains of pain over the right greater trochanter  Exam shows tenderness in the midline of the spine just above the belt line and then in the right buttock  He is also tender over the right greater trochanter  Both hips were taken through a range of motion and were asymptomatic   DG Lumbar Spine 2-3 Views Result Date: 04/05/2024 Lumbar spine imaging Chief complaint pain lower back midline right side and right greater troches Alignment abnormal slight straightening of the lumbar lordosis slight list to the left Facet arthritis at L4-5 and S1 mild disc space narrowing without spondylolysis or listhesis Overall impression Facet arthritis lumbar spondylosis   CT abdomen and pelvis showed some generative disc changes as well  Plain films of the right hip show decreased femoral head neck offset no joint space narrowing inferiorly perhaps some signs of avascular necrosis.  Pelvis and left hip avascular porosis more easily seen no significant joint space narrowing however.  Lateral x-ray again he appears to have decreased femoral head neck offset  Assessment and plan 78 year old male with 3 complaints  1 lower back pain with buttock pain exacerbated by sitting  2 avascular porosis on x-ray no symptoms of groin pain negative physical exam for hip pathology  3 greater trochanteric bursitis with tenderness over the greater trochanter   Plan  #1.  Inject right greater trochanter  Procedure note injection for right hip bursitis  Verbal consent was obtained for injection of the right hip  Timeout was completed to confirm the injection site  The  medications used were 40 mg of Depo-Medrol  and 1% lidocaine  3 cc  Anesthesia was provided by ethyl chloride and the skin was prepped with alcohol.  After cleaning the skin with alcohol a 25-gauge needle was used to inject the greater trochanteric bursa right hip   No complications were noted   #2 physical therapy for lumbar spine  #3 consult for core decompression

## 2024-04-05 NOTE — Patient Instructions (Signed)
 We are referring you to [facility] from [facility] address is [address] [place] The phone number is [phone]  The office will call you with an appointment Dr. Lucienne Ryder  Physical therapy has been ordered for you at [facility]. They should call you to schedule, [phone] is the phone number to call, if you want to call to schedule.

## 2024-04-05 NOTE — Progress Notes (Signed)
   There were no vitals taken for this visit.  There is no height or weight on file to calculate BMI.  Chief Complaint  Patient presents with   Follow-up    Recheck on right hip pain    No diagnosis found.  DOI/DOS/ Date:  ongoing   Worse

## 2024-04-21 ENCOUNTER — Telehealth: Payer: Self-pay | Admitting: Orthopedic Surgery

## 2024-04-21 NOTE — Telephone Encounter (Signed)
 I spoke to patient he does not want physical therapy asked me to close referral /to you FYI he has declined the physical therapy

## 2024-04-29 ENCOUNTER — Ambulatory Visit: Admitting: Orthopaedic Surgery

## 2024-04-29 VITALS — Ht 74.0 in | Wt 205.0 lb

## 2024-04-29 DIAGNOSIS — M87051 Idiopathic aseptic necrosis of right femur: Secondary | ICD-10-CM

## 2024-04-29 DIAGNOSIS — M87052 Idiopathic aseptic necrosis of left femur: Secondary | ICD-10-CM

## 2024-04-29 NOTE — Progress Notes (Signed)
 The patient is a very pleasant and active 78 year old gentleman who is sent from [facility]  to evaluate and treat known chronic avascular necrosis of both of his hips.  He has developed significantly worse right hip pain over the last several months and is getting rapidly worse.  His AVN has been diagnosed with plain films and CT scan involving the pelvis and both hips.  He has a complex medical history in terms of a previous history of colon cancer and prostate cancer.  He has had surgery as it relates to both of these diagnoses.  He also has had carpal tunnel surgery in the past as well as a cervical discectomy and fusion.  All of these have been done in the [facility] system here in [place].  He says his hip pain now has become 10 out of 10 with the right hip.  It is daily and is detrimentally affecting his mobility, his quality of life and his activities of daily living.  He is not on blood thinning medications either.  I was able to review his most recent plain films and CT scan involving the pelvis.  Both hips have wide areas of avascular necrosis in the femoral heads.  There is no evidence of collapse.  I have seen CT scans of his pelvis from as far back as 2018 showing AVN in both hips but is certainly getting worse.  On examination both hips are very stiff with internal and external rotation and his right hip is significantly painful with attempted rotation.  We had a long and thorough discussion about hip replacement surgery.  I do feel that he is a candidate of for having his right hip replaced as soon as we can.  If he gets more symptomatic with his left hip then we would replace that left hip soon afterwards.  I showed them a hip replacement model and went over all imaging studies with him.  I showed him a handout with hip replacement surgery discussion.  We talked about what to expect from an intraoperative and postoperative standpoint and discussed the risk and benefits of surgery.  All  questions concerns were answered and addressed.  Will work on getting this scheduled in the near future for a right total hip replacement.

## 2024-05-18 ENCOUNTER — Other Ambulatory Visit: Payer: Self-pay | Admitting: Physician Assistant

## 2024-05-18 DIAGNOSIS — Z01818 Encounter for other preprocedural examination: Secondary | ICD-10-CM

## 2024-05-24 ENCOUNTER — Telehealth: Payer: Self-pay | Admitting: Orthopaedic Surgery

## 2024-05-24 NOTE — Telephone Encounter (Signed)
 FYI: Patient's wife called to cancel surgery for patient. Doctor is out of network with patient's insurance.

## 2024-05-27 ENCOUNTER — Encounter (HOSPITAL_COMMUNITY): Admission: RE | Admit: 2024-05-27 | Source: Ambulatory Visit

## 2024-06-04 ENCOUNTER — Ambulatory Visit (HOSPITAL_COMMUNITY): Admit: 2024-06-04 | Admitting: Orthopaedic Surgery

## 2024-06-04 SURGERY — ARTHROPLASTY, HIP, TOTAL, ANTERIOR APPROACH
Anesthesia: Spinal | Site: Hip | Laterality: Right

## 2024-06-17 ENCOUNTER — Encounter: Admitting: Orthopaedic Surgery

## 2024-08-04 ENCOUNTER — Encounter (INDEPENDENT_AMBULATORY_CARE_PROVIDER_SITE_OTHER): Payer: Self-pay | Admitting: Gastroenterology

## 2024-08-19 ENCOUNTER — Encounter (INDEPENDENT_AMBULATORY_CARE_PROVIDER_SITE_OTHER): Payer: Self-pay | Admitting: Gastroenterology

## 2024-08-23 ENCOUNTER — Encounter: Payer: Self-pay | Admitting: Radiology

## 2024-10-07 ENCOUNTER — Encounter (INDEPENDENT_AMBULATORY_CARE_PROVIDER_SITE_OTHER): Payer: Self-pay | Admitting: *Deleted

## 2024-11-24 ENCOUNTER — Telehealth (INDEPENDENT_AMBULATORY_CARE_PROVIDER_SITE_OTHER): Payer: Self-pay

## 2024-11-24 NOTE — Telephone Encounter (Signed)
 Who is your primary care physician: Zack Hall  Reasons for the colonoscopy: screening, family history of colon cancer, history of colon polyps  Have you had a colonoscopy before?  yes  Do you have family history of colon cancer? yes  Previous colonoscopy with polyps removed? yes  Do you have a history colorectal cancer?   yes  Are you diabetic? If yes, Type 1 or Type 2?    no  Do you have a prosthetic or mechanical heart valve? no  Do you have a pacemaker/defibrillator?   no  Have you had endocarditis/atrial fibrillation? no  Have you had joint replacement within the last 12 months?  yes  Do you tend to be constipated or have to use laxatives? no  Do you have any history of drugs or alcohol?  no  Do you use supplemental oxygen ?  no  Have you had a stroke or heart attack within the last 6 months? no  Do you take weight loss medication?  no  Do you take any blood-thinning medications such as: (aspirin , warfarin, Plavix, Aggrenox)  no  If yes we need the name, milligram, dosage and who is prescribing doctor   Current Outpatient Medications  Medication Sig Dispense Refill   Cyanocobalamin 1000 MCG/ML KIT Inject 1,000 mcg as directed every 30 (thirty) days. 1 ml inj monthly/1000 mcg     fluticasone  (FLONASE ) 50 MCG/ACT nasal spray Place 2 sprays into both nostrils daily as needed for allergies.      HYDROcodone -acetaminophen  (NORCO/VICODIN) 5-325 MG tablet Take 1-2 tablets by mouth every 6 (six) hours as needed for moderate pain or severe pain. 20 tablet 0   omeprazole  (PRILOSEC) 40 MG capsule Take 1 capsule (40 mg total) by mouth daily. 60 capsule 1   EPINEPHrine  0.3 mg/0.3 mL IJ SOAJ injection Inject 0.3 mLs (0.3 mg total) into the muscle once. 1 Device 0   No current facility-administered medications for this visit.    Allergies[1]  Pharmacy: [facility]  Primary Insurance Name: [employer] number where you can be reached: [phone]     [1]   Allergies Allergen Reactions   Niaspan [Niacin Er (Antihyperlipidemic)] Rash   Tape Rash and Other (See Comments)    Paper tape OK

## 2024-11-24 NOTE — Telephone Encounter (Signed)
Ok to schedule.  Room :Any   Thanks,  Vista Lawman, MD Gastroenterology and Hepatology [facility]

## 2024-11-25 NOTE — Telephone Encounter (Signed)
 ATC patient to schedule TCS, no answer. LVM for call back.
